# Patient Record
Sex: Female | Born: 1935 | Race: White | Hispanic: No | Marital: Married | Smoking: Never smoker
Health system: Southern US, Community
[De-identification: ages and names within clinical notes are randomized; demographics above are authoritative.]

## PROBLEM LIST (undated history)

## (undated) DIAGNOSIS — I209 Angina pectoris, unspecified: Secondary | ICD-10-CM

## (undated) DIAGNOSIS — I10 Essential (primary) hypertension: Secondary | ICD-10-CM

## (undated) DIAGNOSIS — K5730 Diverticulosis of large intestine without perforation or abscess without bleeding: Secondary | ICD-10-CM

## (undated) DIAGNOSIS — H269 Unspecified cataract: Secondary | ICD-10-CM

## (undated) DIAGNOSIS — K2970 Gastritis, unspecified, without bleeding: Secondary | ICD-10-CM

## (undated) DIAGNOSIS — E785 Hyperlipidemia, unspecified: Secondary | ICD-10-CM

## (undated) DIAGNOSIS — D649 Anemia, unspecified: Secondary | ICD-10-CM

## (undated) DIAGNOSIS — F32A Depression, unspecified: Secondary | ICD-10-CM

## (undated) DIAGNOSIS — E079 Disorder of thyroid, unspecified: Secondary | ICD-10-CM

## (undated) DIAGNOSIS — F329 Major depressive disorder, single episode, unspecified: Secondary | ICD-10-CM

## (undated) DIAGNOSIS — I2510 Atherosclerotic heart disease of native coronary artery without angina pectoris: Secondary | ICD-10-CM

## (undated) DIAGNOSIS — K449 Diaphragmatic hernia without obstruction or gangrene: Secondary | ICD-10-CM

## (undated) DIAGNOSIS — K635 Polyp of colon: Secondary | ICD-10-CM

## (undated) DIAGNOSIS — E119 Type 2 diabetes mellitus without complications: Secondary | ICD-10-CM

## (undated) DIAGNOSIS — K209 Esophagitis, unspecified: Secondary | ICD-10-CM

## (undated) DIAGNOSIS — K219 Gastro-esophageal reflux disease without esophagitis: Secondary | ICD-10-CM

## (undated) HISTORY — DX: Anemia, unspecified: D64.9

## (undated) HISTORY — DX: Gastro-esophageal reflux disease without esophagitis: K21.9

## (undated) HISTORY — DX: Diverticulosis of large intestine without perforation or abscess without bleeding: K57.30

## (undated) HISTORY — DX: Unspecified cataract: H26.9

## (undated) HISTORY — DX: Type 2 diabetes mellitus without complications: E11.9

## (undated) HISTORY — DX: Disorder of thyroid, unspecified: E07.9

## (undated) HISTORY — DX: Polyp of colon: K63.5

## (undated) HISTORY — DX: Hyperlipidemia, unspecified: E78.5

## (undated) HISTORY — DX: Diaphragmatic hernia without obstruction or gangrene: K44.9

## (undated) HISTORY — DX: Essential (primary) hypertension: I10

## (undated) HISTORY — DX: Esophagitis, unspecified: K20.9

## (undated) HISTORY — DX: Gastritis, unspecified, without bleeding: K29.70

---

## 1982-01-07 HISTORY — PX: DILATION AND CURETTAGE OF UTERUS: SHX78

## 1997-01-07 HISTORY — PX: BREAST BIOPSY: SHX20

## 1997-04-22 ENCOUNTER — Other Ambulatory Visit: Admission: RE | Admit: 1997-04-22 | Discharge: 1997-04-22 | Payer: Self-pay | Admitting: Dermatology

## 1998-11-16 ENCOUNTER — Ambulatory Visit (HOSPITAL_COMMUNITY): Admission: RE | Admit: 1998-11-16 | Discharge: 1998-11-16 | Payer: Self-pay | Admitting: Internal Medicine

## 1998-11-16 ENCOUNTER — Encounter: Payer: Self-pay | Admitting: Internal Medicine

## 2000-11-25 ENCOUNTER — Other Ambulatory Visit: Admission: RE | Admit: 2000-11-25 | Discharge: 2000-11-25 | Payer: Self-pay | Admitting: Internal Medicine

## 2005-01-22 ENCOUNTER — Other Ambulatory Visit: Admission: RE | Admit: 2005-01-22 | Discharge: 2005-01-22 | Payer: Self-pay | Admitting: Internal Medicine

## 2005-04-29 ENCOUNTER — Ambulatory Visit (HOSPITAL_COMMUNITY): Admission: RE | Admit: 2005-04-29 | Discharge: 2005-04-29 | Payer: Self-pay | Admitting: Internal Medicine

## 2006-11-24 ENCOUNTER — Encounter: Payer: Self-pay | Admitting: Gastroenterology

## 2006-12-03 ENCOUNTER — Encounter: Payer: Self-pay | Admitting: Gastroenterology

## 2007-01-08 DIAGNOSIS — K219 Gastro-esophageal reflux disease without esophagitis: Secondary | ICD-10-CM

## 2007-01-08 DIAGNOSIS — K209 Esophagitis, unspecified without bleeding: Secondary | ICD-10-CM

## 2007-01-08 DIAGNOSIS — K5730 Diverticulosis of large intestine without perforation or abscess without bleeding: Secondary | ICD-10-CM

## 2007-01-08 DIAGNOSIS — K635 Polyp of colon: Secondary | ICD-10-CM

## 2007-01-08 DIAGNOSIS — K2970 Gastritis, unspecified, without bleeding: Secondary | ICD-10-CM

## 2007-01-08 DIAGNOSIS — K449 Diaphragmatic hernia without obstruction or gangrene: Secondary | ICD-10-CM

## 2007-01-08 HISTORY — DX: Gastro-esophageal reflux disease without esophagitis: K21.9

## 2007-01-08 HISTORY — DX: Polyp of colon: K63.5

## 2007-01-08 HISTORY — DX: Diverticulosis of large intestine without perforation or abscess without bleeding: K57.30

## 2007-01-08 HISTORY — DX: Esophagitis, unspecified without bleeding: K20.90

## 2007-01-08 HISTORY — DX: Diaphragmatic hernia without obstruction or gangrene: K44.9

## 2007-01-08 HISTORY — DX: Gastritis, unspecified, without bleeding: K29.70

## 2007-02-25 ENCOUNTER — Encounter: Payer: Self-pay | Admitting: Gastroenterology

## 2007-05-26 ENCOUNTER — Encounter: Payer: Self-pay | Admitting: Gastroenterology

## 2007-06-02 ENCOUNTER — Encounter: Payer: Self-pay | Admitting: Gastroenterology

## 2007-06-04 ENCOUNTER — Ambulatory Visit: Payer: Self-pay | Admitting: Gastroenterology

## 2007-06-04 DIAGNOSIS — N182 Chronic kidney disease, stage 2 (mild): Secondary | ICD-10-CM

## 2007-06-04 DIAGNOSIS — E1122 Type 2 diabetes mellitus with diabetic chronic kidney disease: Secondary | ICD-10-CM | POA: Insufficient documentation

## 2007-06-04 DIAGNOSIS — Z6824 Body mass index (BMI) 24.0-24.9, adult: Secondary | ICD-10-CM | POA: Insufficient documentation

## 2007-06-04 DIAGNOSIS — E782 Mixed hyperlipidemia: Secondary | ICD-10-CM

## 2007-06-04 DIAGNOSIS — E1169 Type 2 diabetes mellitus with other specified complication: Secondary | ICD-10-CM | POA: Insufficient documentation

## 2007-06-04 DIAGNOSIS — I10 Essential (primary) hypertension: Secondary | ICD-10-CM | POA: Insufficient documentation

## 2007-06-08 ENCOUNTER — Ambulatory Visit: Payer: Self-pay | Admitting: Gastroenterology

## 2007-06-08 ENCOUNTER — Encounter: Payer: Self-pay | Admitting: Gastroenterology

## 2007-06-09 ENCOUNTER — Encounter: Payer: Self-pay | Admitting: Gastroenterology

## 2007-06-11 ENCOUNTER — Encounter: Payer: Self-pay | Admitting: Gastroenterology

## 2007-06-14 ENCOUNTER — Encounter: Payer: Self-pay | Admitting: Gastroenterology

## 2010-02-06 NOTE — Miscellaneous (Signed)
Summary: Kapidex  Clinical Lists Changes  Medications: Added new medication of KAPIDEX 30 MG  CPDR (DEXLANSOPRAZOLE) Take one capsule by mouth 30 minutes before breakfast - Signed Rx of KAPIDEX 30 MG  CPDR (DEXLANSOPRAZOLE) Take one capsule by mouth 30 minutes before breakfast;  #31 x 6;  Signed;  Entered by: Eual Fines RN;  Authorized by: Mardella Layman MD FACG,FAGA;  Method used: Electronic    Prescriptions: KAPIDEX 30 MG  CPDR (DEXLANSOPRAZOLE) Take one capsule by mouth 30 minutes before breakfast  #31 x 6   Entered by:   Eual Fines RN   Authorized by:   Mardella Layman MD [facility]   Signed by:   Eual Fines RN on 06/08/2007   Method used:   Electronically sent to ...       [facility]  [address] [id number]       [address]       [place], [place]  [address]       Ph: [phone] or [phone]       Fax: [phone]   RxID:   [id number]

## 2010-02-06 NOTE — Procedures (Signed)
Summary: Colonoscopy   Colonoscopy  Procedure date:  06/08/2007  Findings:      Location:  [facility].    Procedures Next Due Date:    Colonoscopy: 06/2012  Patient Name: [name] [name], [name] [name] MRN:  Procedure Procedures: Colonoscopy CPT: 45409.  Personnel: Endoscopist: Vania Rea. Jarold Motto, MD.  Exam Location: Exam performed in Outpatient Clinic. Outpatient  Patient Consent: Procedure, Alternatives, Risks and Benefits discussed, consent obtained, from patient. Consent was obtained by the RN.  Indications Symptoms: Constipation Patient's stools are infrequent. Abdominal pain / bloating.  Average Risk Screening Routine.  History  Current Medications: Patient is not currently taking Coumadin.  Medical/ Surgical History: Adult Onset Diabetes, Hypothyroidism, Hyperlipidemia, Hypertension,  Comments: Known umbilical hernia and diverticulosis seen on CT scan.. Pre-Exam Physical: Performed Jun 08, 2007. Cardio-pulmonary exam, Rectal exam, Abdominal exam, Extremity exam, Mental status exam WNL.  Comments: Pt. history reviewed/updated, physical exam performed prior to initiation of sedation? yes Exam Exam: Extent of exam reached: Cecum, extent intended: Cecum.  The cecum was identified by appendiceal orifice and IC valve. Patient position: on left side. Time to Cecum: 00:03:40. Time for Withdrawl: 00:06:20. Colon retroflexion performed. Images taken. ASA Classification: II. Tolerance: excellent.  Monitoring: Pulse and BP monitoring, Oximetry used. Supplemental O2 given. at 2 Liters.  Colon Prep Used Golytely for colon prep.  Sedation Meds: Patient assessed and found to be appropriate for moderate (conscious) sedation. Fentanyl 50 mcg. given IV. Versed 7 mg. given IV.  Instrument(s): CF 140L. Serial [id number].  Findings - NORMAL EXAM: Cecum to Descending Colon. Not Seen: Polyps. AVM's. Colitis. Tumors. Crohn's.  - DIVERTICULOSIS: Descending Colon to  Sigmoid Colon. Not bleeding. ICD9: Diverticulosis, Colon: 562.10.  - POLYP: Sigmoid Colon, Maximum size: 4 mm. sessile polyp. Procedure:  biopsy without cautery, removed, retrieved, Polyp sent to pathology. ICD9: Colon Polyps: 211.3.  - NORMAL EXAM: Rectum. Not Seen: AVM's. Tumors. Crohn's. Hemorrhoids.   Assessment  Diagnoses: 562.10: Diverticulosis, Colon.  211.3: Colon Polyps.   Comments: NO CAUSE NOTED FOR RUQ PAIN...PROBABLE MS PAIN VS FATTY LIVER PROBLEMS.... Events  Unplanned Interventions: No intervention was required.  Plans Medication Plan: Continue current medications.  Patient Education: Patient given standard instructions for: Polyps. Diverticulosis. Patient instructed to get routine colonoscopy every 5 years.  Disposition: After procedure patient sent to recovery. After recovery patient sent home.  Scheduling/Referral: Follow-Up prn. Await pathology to schedule patient.   cc:  Lucky Cowboy MD     REPORT OF SURGICAL PATHOLOGY   Case #: [id number] Patient Name: [name] [name], [name] [name]. Office Chart Number:  N/A   MRN: [id number] Pathologist: Ferd Hibbs. Colonel Bald, MD DOB/Age  [birth year] (Age: 77)    Gender: F Date Taken:  06/08/2007 Date Received: 06/09/2007   FINAL DIAGNOSIS   ***MICROSCOPIC EXAMINATION AND DIAGNOSIS***   RECTUM, POLYP(S) -  TUBULAR ADENOMA. -  HIGH GRADE DYSPLASIA IS NOT IDENTIFIED. -  SEE COMMENT.   COMMENT Dr. Tammi Sou has reviewed the case and concurs with this interpretation.  (JBK:kv 06-10-07)   kv Date Reported:  06/10/2007     Ferd Hibbs. Colonel Bald, MD *** Electronically Signed Out By [facility] ***     June 14, 2007 MRN: [id number]    [name] [name] [address] [place], [place]  [address]    [name],  I am pleased to inform you that the colon polyp(s) removed during your recent colonoscopy was (were) found to be benign (no cancer detected) upon pathologic examination.  I recommend you have a repeat  colonoscopy  examination in 5_ years to look for recurrent polyps, as having colon polyps increases your risk for having recurrent polyps or even colon cancer in the future.  Should you develop new or worsening symptoms of abdominal pain, bowel habit changes or bleeding from the rectum or bowels, please schedule an evaluation with either your primary care physician or with me.  Additional information/recommendations:  __ No further action with gastroenterology is needed at this time. Please      follow-up with your primary care physician for your other healthcare      needs.  __ Please call [phone] to schedule a return visit to review your      situation.  __ Please keep your follow-up visit as already scheduled.  _x_ Continue treatment plan as outlined the day of your exam.  Please call us if you are having persistent problems or have questions about your condition that have not been fully answered at this time.  Sincerely,  Mardella Layman MD [facility]  This letter has been electronically signed by your physician.   This report was created from the original endoscopy report, which was reviewed and signed by the above listed endoscopist.

## 2010-02-06 NOTE — Assessment & Plan Note (Signed)
Summary: RUQ PAIN.Marland KitchenEM   History of Present Illness Visit Type: consult Primary GI MD: Sheryn Bison MD FACP FAGA Primary Provider: Dr. Lucky Cowboy Requesting Provider: Dr. Lucky Cowboy Chief Complaint: RUQ intermittant sharp abd pains X 1 yr, now getting worse and becoming more frequent History of Present Illness:   This middle-aged obese white female with adult onset diabetes is referred through the courtesy ofDr. Oneta Rack for evaluation of one-year of right upper quadrant pain which is muscular skeletal in nature. She's had extensive hepatobiliary workup included ultrasound, HIDA scan, and recent CT scan all of which have been normal except for a 9 mm common bile duct and possible debris in the common bile duct although this is unclear per radiology report. She certainly has no hepatobiliary symptomatology such as icterus, clay colored stools, or dark urine. She has no history of fever, chills, anorexia, weight loss, or chronic dyspepsia or acid reflux. She does have early satiety his family she may have some abdominal discomfort if she keeps too much at one time. As mentioned above she does have adult onset diabetes without complications such as peripheral neuropathy. However, she does have rather marked chronic constipation without melena or hematochezia her appetite is good her weight is been stable.  She describes a right upper quadrant pain as occurring when she moves and turns in bed restrained herself. She had no history of hepatitis or pancreatitis. She never had colonoscopy endoscopic exams. Recent multiple lab tests including liver function tests had been repeatedly normal. She denies abuse of alcohol, cigarettes, or NSAIDs. She's never had colonoscopy or endoscopy.   GI Review of Systems      Denies abdominal pain, acid reflux, belching, bloating, chest pain, dysphagia with liquids, dysphagia with solids, heartburn, loss of appetite, nausea, vomiting, vomiting blood, weight  loss, and  weight gain.      Reports constipation.     Denies anal fissure, black tarry stools, change in bowel habit, diarrhea, diverticulosis, fecal incontinence, heme positive stool, hemorrhoids, irritable bowel syndrome, jaundice, light color stool, liver problems, rectal bleeding, and  rectal pain.     Updated Prior Medication List: LEVOTHROID 75 MCG  TABS (LEVOTHYROXINE SODIUM) 1/2 tablet on Tues,Thurs,Sat,Sun and 1 tablet on Mon, Wed, Friday VERAPAMIL HCL CR 240 MG  TBCR (VERAPAMIL HCL) 1 tablet three times a day on Monday and Friday ATENOLOL 100 MG  TABS (ATENOLOL) once daily LISINOPRIL 10 MG  TABS (LISINOPRIL) once daily PRAVASTATIN SODIUM 40 MG  TABS (PRAVASTATIN SODIUM) once daily GLYBURIDE 5 MG  TABS (GLYBURIDE) one tablet every other day JANUVIA 100 MG  TABS (SITAGLIPTIN PHOSPHATE) one tablet every other day ANACIN 81 MG  TBEC (ASPIRIN) once daily  Current Allergies (reviewed today): No known allergies   Past Medical History:    Diabetes    Hypothyroidism    Hyperlipidemia    Hypertension    Uterine fibroids        Diverticulosis seen on CT scan        Small right renal cyst and atherosclerosis senile CT scan        Hilum hernia and umbilical hernia seen on CT scan.        Thoracic and lumbar spondylosis also seen on CT scan.   Family History:    Family History of Breast Cancer:Grandmother,Aunt    Family History of Diabetes: Father, Aunt  Social History:    Married    Occupation: retired    Patient has never smoked.     Alcohol Use -  no    Illicit Drug Use - no   Risk Factors:  Tobacco use:  never Drug use:  no Alcohol use:  no   Review of Systems       The patient complains of abdominal pain.  The patient denies anorexia, fever, weight loss, weight gain, vision loss, decreased hearing, hoarseness, chest pain, syncope, dyspnea on exertion, peripheral edema, prolonged cough, headaches, hemoptysis, melena, hematochezia, severe  indigestion/heartburn, hematuria, incontinence, genital sores, muscle weakness, suspicious skin lesions, transient blindness, difficulty walking, depression, unusual weight change, abnormal bleeding, enlarged lymph nodes, angioedema, breast masses, and testicular masses.         as mentioned above she does have symptoms of early satiety suggestive of diabetic gastroparesis. She denies any dyspepsia or acid reflux symptoms.  Eyes      Denies blurring, diplopia, irritation, discharge, vision loss, scotoma, eye pain, and photophobia.  CV      Denies chest pains, angina, palpitations, syncope, dyspnea on exertion, orthopnea, PND, peripheral edema, and claudication.  Resp      Denies dyspnea at rest, dyspnea with exercise, cough, sputum, wheezing, coughing up blood, and pleurisy.  GI      Denies difficulty swallowing, pain on swallowing, nausea, indigestion/heartburn, vomiting, vomiting blood, abdominal pain, jaundice, gas/bloating, diarrhea, constipation, change in bowel habits, bloody BM's, black BMs, and fecal incontinence.  MS      Complains of joint stiffness and low back pain.   Vital Signs:  Patient Profile:   77 Years Old Female Height:     62 inches Weight:      172 pounds BMI:     31.57 Pulse rate:   80 / minute Pulse rhythm:   regular BP sitting:   126 / 72  (left arm)  Vitals Entered By: Christie Nottingham CMA (Jun 04, 2007 9:07 AM)                  Physical Exam  General:     Well developed, well nourished, no acute distress. Head:     The patient seemed to have excessive amounts of facial hair suggesting possible underlying normal dysfunction. Neck:     Supple; no masses or thyromegaly. Chest Wall:     Symmetrical;  no deformities or tenderness. Lungs:     Clear throughout to auscultation. Heart:     Regular rate and rhythm; no murmurs, rubs,  or bruits. Abdomen:     Her abdominal exam showed no definite organomegaly, masses or tenderness. She did have a  floating rib in the right upper quadrant but it was nontender. Bowel sounds were normal. Rectal exam is deferred. There is no evidence of abdominal masses or ascites. Rectal exam is deferred. Msk:     Symmetrical with no gross deformities. Normal posture. Extremities:     No clubbing, cyanosis, edema or deformities noted. Neurologic:     Alert and  oriented x4;  grossly normal neurologically.    Impression & Recommendations:  Problem # 1:  ABDOMINAL PAIN-RUQ (ICD-789.01) Assessment: Unchanged She has had right upper quadrant pain for over year without any hepatobiliary complaints or abnormal labs. I think her pain is musculoskeletal in nature related to her obesity. Orders: Colon/Endo (Colon/Endo)   Problem # 2:  EARLY SATIETY (ICD-780.94) Assessment: Unchanged this symptom is most consistent with mild diabetic gastroparesis and we will do endoscopy and consider gastric emptying scan and possible prokinetic therapy although I do not feel this is a big problem for her.  Problem # 3:  CONSTIPATION (ICD-564.00) Assessment: Unchanged Again I thank her constipation related to her diabetes and her diverticulosis and should respond to increase fiber in her diet more liberal p.o. fluids. However, she never had colonoscopy exam which is definitely needed and has been scheduled for her. Orders: Colon/Endo (Colon/Endo)   Problem # 4:  ABNORMAL EXAM-BILIARY TRACT (ICD-793.3) Assessment: New I do not think that this patient has common bile duct stones I do not think it is worth the risk of ERCP or endoscopic ultrasound at this point. She probably has some fatty infiltration of her liver associated with her obesity and metabolic syndrome and would benefit from weight loss instructions and a scheduled program under dietary control. Orders: Colon/Endo (Colon/Endo)   Problem # 5:  DIABETES MELLITUS-TYPE II (ICD-250.00) Assessment: Unchanged Continued oral medications as per Dr.  Oneta Rack. Orders: Colon/Endo (Colon/Endo)    Patient Instructions: 1)  copy to Dr. Marlowe Shores. 2)  Colonoscopy and Flexible Sigmoidoscopy brochure given. 3)  Conscious Sedation brochure given. 4)  Upper Endoscopy brochure given.    Prescriptions: MOVIPREP 100 GM  SOLR (PEG-KCL-NACL-NASULF-NA ASC-C) As per prep instructions.  #1 x 0   Entered by:   Harlow Mares CMA   Authorized by:   Mardella Layman MD FACG,FAGA   Signed by:   Harlow Mares CMA on 06/04/2007   Method used:   Electronically [facility] to ...       [facility]  [address] [id number]       [address]       [place], [place]  [address]       Ph: [phone] or [phone]       Fax: [phone]   RxID:   [id number]  ]

## 2010-02-06 NOTE — Medication Information (Signed)
Summary: approval/Kapidex/[employer]  approval/Kapidex/[employer]   Imported By: Lester La Villa 06/12/2007 11:23:24  _____________________________________________________________________  External Attachment:    Type:   Image     Comment:   External Document

## 2010-02-06 NOTE — Letter (Signed)
Summary: Patient Notice- Polyp Results  [facility]  [address] [place], [place] [address]   Phone: [phone]  Fax: [phone]        June 14, 2007 MRN: [id number]    [name] [name] [address] [place], [place]  [address]    Dear [name],  I am pleased to inform you that the colon polyp(s) removed during your recent colonoscopy was (were) found to be benign (no cancer detected) upon pathologic examination.  I recommend you have a repeat colonoscopy examination in 5_ years to look for recurrent polyps, as having colon polyps increases your risk for having recurrent polyps or even colon cancer in the future.  Should you develop new or worsening symptoms of abdominal pain, bowel habit changes or bleeding from the rectum or bowels, please schedule an evaluation with either your primary care physician or with me.  Additional information/recommendations:  __ No further action with gastroenterology is needed at this time. Please      follow-up with your primary care physician for your other healthcare      needs.  __ Please call [phone] to schedule a return visit to review your      situation.  __ Please keep your follow-up visit as already scheduled.  _x_ Continue treatment plan as outlined the day of your exam.  Please call us if you are having persistent problems or have questions about your condition that have not been fully answered at this time.  Sincerely,  Mardella Layman MD [facility]  This letter has been electronically signed by your physician.

## 2010-02-06 NOTE — Procedures (Signed)
Summary: EGD   EGD  Procedure date:  06/08/2007  Findings:      Location: [facility]    Patient Name: [name] [name], Convery MRN:  Procedure Procedures: Panendoscopy (EGD) CPT: 43235.  Personnel: Endoscopist: Vania Rea. Jarold Motto, MD.  Exam Location: Exam performed in Outpatient Clinic. Outpatient  Patient Consent: Procedure, Alternatives, Risks and Benefits discussed, consent obtained, from patient. Consent was obtained by the RN.  Indications Symptoms: Early satiety. Abdominal pain, location: RUQ. Reflux symptoms  History  Current Medications: Patient is not currently taking Coumadin.  Medical/Surgical History: Adult Onset Diabetes, Hypothyroidism, Hyperlipidemia, Hypertension,  Pre-Exam Physical: Performed Jun 08, 2007  Cardio-pulmonary exam, Abdominal exam, Extremity exam, Mental status exam WNL.  Comments: Pt. history reviewed/updated, physical exam performed prior to initiation of sedation?YES Exam Exam Info: Maximum depth of insertion Duodenum, intended Duodenum. Patient position: on left side. Duration of exam: 10 minutes. Vocal cords visualized. Gastric retroflexion performed. Images taken. ASA Classification: II. Tolerance: excellent.  Sedation Meds: Patient assessed and found to be appropriate for moderate (conscious) sedation. Sedation was managed by the Endoscopist.  Monitoring: BP and pulse monitoring done. Oximetry used. Supplemental O2 given at 2 Liters.  Instrument(s): GIF 160. Serial [id number].   Findings - Normal: Proximal Esophagus.  - HIATAL HERNIA: Prolapsing, 5 cms. in length. ICD9: Hernia, Hiatal: 553.3.Comments: FREE REFLUX NOTED.......  - ESOPHAGEAL INFLAMMATION: as a result of reflux. Length of inflammation: 4 cm. [place] Classification: Grade A. ICD9: Esophagitis, Reflux: 530.11.  - Normal: Duodenal Bulb to Duodenal 2nd Portion. Not Seen: Ulcer. Mucosal abnormality.  - MUCOSAL ABNORMALITY: Body to Antrum. Nodularity  present. Red spots present. RUT done, results pending. ICD9: Gastritis, Unspecified: 535.50.   Assessment  Diagnoses: 530.11: Esophagitis, Reflux. GERD.  553.3: Hernia, Hiatal.  535.50: Gastritis, Unspecified.   Events  Unplanned Intervention: No unplanned interventions were required.  Plans Medication(s): Continue current medications. Other: Kapidex 60 mg  QAM, starting Jun 08, 2007 for 4 wks.   Patient Education: Patient given standard instructions for: Reflux.  Disposition: After procedure patient sent to recovery. After recovery patient sent home.  Scheduling: Await pathology to schedule patient. Clinic Visit, to Vania Rea. Jarold Motto, MD, around Jul 08, 2007.    cc:  Lucky Cowboy MD  This report was created from the original endoscopy report, which was reviewed and signed by the above listed endoscopist.

## 2010-07-25 ENCOUNTER — Other Ambulatory Visit: Payer: Self-pay | Admitting: Internal Medicine

## 2010-07-25 ENCOUNTER — Other Ambulatory Visit (HOSPITAL_COMMUNITY)
Admission: RE | Admit: 2010-07-25 | Discharge: 2010-07-25 | Disposition: A | Discharge status: Home / Self Care | Payer: PRIVATE HEALTH INSURANCE | Source: Ambulatory Visit | Attending: Internal Medicine | Admitting: Internal Medicine

## 2010-07-25 DIAGNOSIS — Z124 Encounter for screening for malignant neoplasm of cervix: Secondary | ICD-10-CM | POA: Insufficient documentation

## 2010-07-25 DIAGNOSIS — Z1159 Encounter for screening for other viral diseases: Secondary | ICD-10-CM | POA: Insufficient documentation

## 2010-07-26 ENCOUNTER — Encounter: Payer: Self-pay | Admitting: Gastroenterology

## 2010-08-28 ENCOUNTER — Ambulatory Visit: Payer: Self-pay | Admitting: Gastroenterology

## 2012-04-01 ENCOUNTER — Encounter: Payer: Self-pay | Admitting: Gastroenterology

## 2012-04-14 ENCOUNTER — Encounter: Payer: Self-pay | Admitting: *Deleted

## 2012-04-21 ENCOUNTER — Ambulatory Visit (INDEPENDENT_AMBULATORY_CARE_PROVIDER_SITE_OTHER): Payer: PRIVATE HEALTH INSURANCE | Admitting: Gastroenterology

## 2012-04-21 ENCOUNTER — Ambulatory Visit: Payer: Self-pay | Admitting: Gastroenterology

## 2012-04-21 ENCOUNTER — Encounter: Payer: Self-pay | Admitting: Gastroenterology

## 2012-04-21 VITALS — BP 132/76 | HR 86 | Ht 60.5 in | Wt 155.0 lb

## 2012-04-21 DIAGNOSIS — R112 Nausea with vomiting, unspecified: Secondary | ICD-10-CM

## 2012-04-21 DIAGNOSIS — K3184 Gastroparesis: Secondary | ICD-10-CM

## 2012-04-21 DIAGNOSIS — Z86010 Personal history of colonic polyps: Secondary | ICD-10-CM

## 2012-04-21 DIAGNOSIS — E1149 Type 2 diabetes mellitus with other diabetic neurological complication: Secondary | ICD-10-CM

## 2012-04-21 DIAGNOSIS — K5900 Constipation, unspecified: Secondary | ICD-10-CM

## 2012-04-21 DIAGNOSIS — E119 Type 2 diabetes mellitus without complications: Secondary | ICD-10-CM

## 2012-04-21 DIAGNOSIS — E1143 Type 2 diabetes mellitus with diabetic autonomic (poly)neuropathy: Secondary | ICD-10-CM

## 2012-04-21 NOTE — Progress Notes (Signed)
This is a 79 year old Caucasian female a previously saw in January of 2012 her musculoskeletal right upper quadrant pain.  At that time she had CT scan of the abdomen which was unremarkable except for a 9 mm common bile duct.  Also felt that she had mild diabetic gastroparesis.  She is now referred for evaluation of intermittent nausea and vomiting and vague right upper quadrant discomfort with associated constipation but no melena or hematochezia.  She denies reflux symptoms, dysphagia, anorexia, weight loss, fever chills.  Recent laboratory data showed normal CBC, liver profile, and hemoglobin A1c of 8.1.  She is referred for possible endoscopic exam.  It is unclear to me when she last had colonoscopy.  Family history is remarkable for a brother who possibly has achalasia.  Review of her medication shows that she is not on NSAIDs, but is recently been placed on Reglan 5 mg 3 times a day with marked improvement.  She also takes and takes 300 mg at bedtime, Celexa 40 mg a day, Lipitor 80 mg a day, verapamil 240 mg a day along with metformin 1 g twice a day.  She is accompanied today by her niece who gives most of the history.  The patient appears to have some mild dementia.  Current Medications, Allergies, Past Medical History, Past Surgical History, Family History and Social History were reviewed in [employer] record.  ROS: All systems were reviewed and are negative unless otherwise stated in the HPI.          Physical Exam: Healthy-appearing nontoxic female in no distress.  Blood pressure 132/76, pulse 86 and regular and weight 155 with a BMI of 29.76.  I cannot appreciate stigmata of chronic liver disease.  Her chest is entirely clear she appear to be in a regular rhythm without murmurs gallops or rubs.  There is no hepatosplenomegaly, abdominal masses or significant tenderness.  Bowel sounds are normal and I cannot appreciate a succussion splash.  Peripheral extremities are  unremarkable.  Rectal exam shows impacted stool in the rectal vault which is guaiac negative.  Mental status is depressed but patient oriented x3.    Assessment and Plan: Probable diabetic gastroparesis as previously suspected.  She has associated mild chronic functional constipation.  I will begin her examination with endoscopy tomorrow afternoon, and if this is unremarkable we will proceed with strict emptying scan, high-resolution esophageal manometry, and does some point she will need colonoscopy exam per a tubular adenoma removed in 2009.  I've asked her continue Reglan since this is given her good symptomatic improvement.  Dr. Oneta Rack is working with the patient for better diabetic control, we will continue other medications as listed and reviewed.  Her symptoms do not seem consistent with hepatobiliary disease or achalasia.  After endoscopy we'll start her on regular MiraLax therapy and probably had PPI medications to her regime. Encounter Diagnosis  Name Primary?  . Nausea with vomiting Yes

## 2012-04-21 NOTE — Patient Instructions (Addendum)
You have been scheduled for an endoscopy with propofol. Please follow written instructions given to you at your visit today. If you use inhalers (even only as needed), please bring them with you on the day of your procedure. CC:  Lucky Cowboy MD

## 2012-04-22 ENCOUNTER — Encounter: Payer: Self-pay | Admitting: Gastroenterology

## 2012-04-22 ENCOUNTER — Ambulatory Visit (AMBULATORY_SURGERY_CENTER): Payer: PRIVATE HEALTH INSURANCE | Admitting: Gastroenterology

## 2012-04-22 ENCOUNTER — Other Ambulatory Visit: Payer: Self-pay | Admitting: Gastroenterology

## 2012-04-22 VITALS — BP 151/88 | HR 78 | Temp 97.8°F | Resp 20 | Ht 60.5 in | Wt 155.0 lb

## 2012-04-22 DIAGNOSIS — K219 Gastro-esophageal reflux disease without esophagitis: Secondary | ICD-10-CM

## 2012-04-22 DIAGNOSIS — K209 Esophagitis, unspecified without bleeding: Secondary | ICD-10-CM

## 2012-04-22 DIAGNOSIS — R6881 Early satiety: Secondary | ICD-10-CM

## 2012-04-22 DIAGNOSIS — R112 Nausea with vomiting, unspecified: Secondary | ICD-10-CM

## 2012-04-22 DIAGNOSIS — R1011 Right upper quadrant pain: Secondary | ICD-10-CM

## 2012-04-22 MED ORDER — SODIUM CHLORIDE 0.9 % IV SOLN: 500.0000 mL | INTRAVENOUS | Status: DC

## 2012-04-22 MED ORDER — DEXLANSOPRAZOLE 60 MG PO CPDR: 60.0000 mg | DELAYED_RELEASE_CAPSULE | Freq: Every day | ORAL | Status: DC

## 2012-04-22 NOTE — Patient Instructions (Addendum)
Biopsies taken today. Try to follow Gastroparesis diet, see handouts. Take new medication Dexilant daily,samples given. Blood sugar was 148 today. Dr.Patterson's nurse will call about Gastric emptying scan. Call us with any questions or concerns. Thank you!  YOU HAD AN ENDOSCOPIC PROCEDURE TODAY AT THE [facility]: Refer to the procedure report that was given to you for any specific questions about what was found during the examination.  If the procedure report does not answer your questions, please call your gastroenterologist to clarify.  If you requested that your care partner not be given the details of your procedure findings, then the procedure report has been included in a sealed envelope for you to review at your convenience later.  YOU SHOULD EXPECT: Some feelings of bloating in the abdomen. Passage of more gas than usual.  Walking can help get rid of the air that was put into your GI tract during the procedure and reduce the bloating. If you had a lower endoscopy (such as a colonoscopy or flexible sigmoidoscopy) you may notice spotting of blood in your stool or on the toilet paper. If you underwent a bowel prep for your procedure, then you may not have a normal bowel movement for a few days.  DIET: Your first meal following the procedure should be a light meal and then it is ok to progress to your normal diet.  A half-sandwich or bowl of soup is an example of a good first meal.  Heavy or fried foods are harder to digest and may make you feel nauseous or bloated.  Likewise meals heavy in dairy and vegetables can cause extra gas to form and this can also increase the bloating.  Drink plenty of fluids but you should avoid alcoholic beverages for 24 hours.  ACTIVITY: Your care partner should take you home directly after the procedure.  You should plan to take it easy, moving slowly for the rest of the day.  You can resume normal activity the day after the procedure however you should NOT  DRIVE or use heavy machinery for 24 hours (because of the sedation medicines used during the test).    SYMPTOMS TO REPORT IMMEDIATELY: A gastroenterologist can be reached at any hour.  During normal business hours, 8:30 AM to 5:00 PM Monday through Friday, call [phone].  After hours and on weekends, please call the GI answering service at [phone] who will take a message and have the physician on call contact you.   Following lower endoscopy (colonoscopy or flexible sigmoidoscopy):  Excessive amounts of blood in the stool  Significant tenderness or worsening of abdominal pains  Swelling of the abdomen that is new, acute  Fever of 100F or higher  Following upper endoscopy (EGD)  Vomiting of blood or coffee ground material  New chest pain or pain under the shoulder blades  Painful or persistently difficult swallowing  New shortness of breath  Fever of 100F or higher  Black, tarry-looking stools  FOLLOW UP: If any biopsies were taken you will be contacted by phone or by letter within the next 1-3 weeks.  Call your gastroenterologist if you have not heard about the biopsies in 3 weeks.  Our staff will call the home number listed on your records the next business day following your procedure to check on you and address any questions or concerns that you may have at that time regarding the information given to you following your procedure. This is a courtesy call and so if there is  no answer at the home number and we have not heard from you through the emergency physician on call, we will assume that you have returned to your regular daily activities without incident.  SIGNATURES/CONFIDENTIALITY: You and/or your care partner have signed paperwork which will be entered into your electronic medical record.  These signatures attest to the fact that that the information above on your After Visit Summary has been reviewed and is understood.  Full responsibility of the confidentiality of  this discharge information lies with you and/or your care-partner.

## 2012-04-22 NOTE — Op Note (Signed)
[facility] [address] [place], [address]   ENDOSCOPY PROCEDURE REPORT  PATIENT: [name], [name]  MR#: [id number] BIRTHDATE: [birth year] , 80  yrs. old GENDER: Female ENDOSCOPIST:Matie Dimaano Hale Bogus, MD, [facility] REFERRED BY: Lucky Cowboy, M.D. PROCEDURE DATE:  04/22/2012 PROCEDURE:   EGD w/ biopsy and EGD w/ biopsy for H.pylori ASA CLASS:    Class III INDICATIONS: Vomiting and Nausea...diabetic. MEDICATION: propofol (Diprivan) 200mg  IV TOPICAL ANESTHETIC:  DESCRIPTION OF PROCEDURE:   After the risks and benefits of the procedure were explained, informed consent was obtained.  The LB GIF-H180 [id number]  endoscope was introduced through the mouth  and advanced to the second portion of the duodenum .  The instrument was slowly withdrawn as the mucosa was fully examined.      DUODENUM: The duodenal mucosa showed no abnormalities in the bulb and second portion of the duodenum.  Cold forcep biopsies were taken in the second portion.  STOMACH: The mucosa of the stomach appeared normal.  A CLO biopsy was performed.  ESOPHAGUS: Reflux esophagitis was found in the lower third of the esophagus.  Esophagitis was Savary-Miller Grade II: multiple lesions and folds, non-circum. Biopsies done.  Retroflexed views revealed no abnormalities.    The scope was then withdrawn from the patient and the procedure completed.  COMPLICATIONS: There were no complications.   ENDOSCOPIC IMPRESSION: 1.   The duodenal mucosa showed no abnormalities in the bulb and second portion of the duodenum..r/o celiac disease 2.   The mucosa of the stomach appeared normal; biopsy .Marland KitchenMarland KitchenCLO BX. done,r/o H.pylori 3.   Esophagitis consistent with reflux esophagitis in the lower third of the esophagus .Marland KitchenGERD 4.   Suspected gastroparesis RECOMMENDATIONS: Await biopsy results Gastric emptying scan Dexilant 40 mg/day Gastroparesis diet    _______________________________ eSigned:  Mardella Layman, MD, [facility] 04/22/2012 1:48 PM      PATIENT NAME:  [name] [name], [name] [name] MR#: [id number]

## 2012-04-22 NOTE — Progress Notes (Signed)
Called to room to assist during endoscopic procedure.  Patient ID and intended procedure confirmed with present staff. Received instructions for my participation in the procedure from the performing physician.  

## 2012-04-22 NOTE — Progress Notes (Signed)
Patient did not experience any of the following events: a burn prior to discharge; a fall within the facility; wrong site/side/patient/procedure/implant event; or a hospital transfer or hospital admission upon discharge from the facility. (G8907) Patient did not have preoperative order for IV antibiotic SSI prophylaxis. (G8918)  

## 2012-04-23 ENCOUNTER — Telehealth: Payer: Self-pay

## 2012-04-23 ENCOUNTER — Telehealth: Payer: Self-pay | Admitting: *Deleted

## 2012-04-23 ENCOUNTER — Ambulatory Visit (AMBULATORY_SURGERY_CENTER): Payer: PRIVATE HEALTH INSURANCE | Admitting: *Deleted

## 2012-04-23 VITALS — Ht 65.0 in | Wt 155.0 lb

## 2012-04-23 DIAGNOSIS — E119 Type 2 diabetes mellitus without complications: Secondary | ICD-10-CM

## 2012-04-23 DIAGNOSIS — Z1211 Encounter for screening for malignant neoplasm of colon: Secondary | ICD-10-CM

## 2012-04-23 DIAGNOSIS — R112 Nausea with vomiting, unspecified: Secondary | ICD-10-CM

## 2012-04-23 LAB — HELICOBACTER PYLORI SCREEN-BIOPSY: UREASE: NEGATIVE

## 2012-04-23 MED ORDER — MOVIPREP 100 G PO SOLR: ORAL | Status: DC

## 2012-04-23 NOTE — Telephone Encounter (Signed)
Spoke with pt to inform her she needs a PV prior to her COLON on 04/29/12. She was hesitant and didn't fully understand, but will come in this pm. We also spoke of the need for a Gastric Emptying Scan to see if that is the cause of her problems. I will leave her appt info with the PV nurse, Ezra Sites RN and pt may call me for questions.

## 2012-04-23 NOTE — Telephone Encounter (Signed)
Unable to leave message no answering machine. 

## 2012-04-27 ENCOUNTER — Encounter: Payer: Self-pay | Admitting: *Deleted

## 2012-04-27 ENCOUNTER — Encounter: Payer: Self-pay | Admitting: Gastroenterology

## 2012-04-29 ENCOUNTER — Other Ambulatory Visit: Payer: Self-pay | Admitting: *Deleted

## 2012-04-29 ENCOUNTER — Encounter: Payer: Self-pay | Admitting: Gastroenterology

## 2012-04-29 ENCOUNTER — Ambulatory Visit (AMBULATORY_SURGERY_CENTER): Payer: PRIVATE HEALTH INSURANCE | Admitting: Gastroenterology

## 2012-04-29 ENCOUNTER — Other Ambulatory Visit: Payer: Self-pay | Admitting: Gastroenterology

## 2012-04-29 VITALS — BP 137/66 | HR 67 | Temp 97.9°F | Resp 15 | Ht 65.0 in | Wt 155.0 lb

## 2012-04-29 DIAGNOSIS — K3184 Gastroparesis: Secondary | ICD-10-CM

## 2012-04-29 DIAGNOSIS — R1011 Right upper quadrant pain: Secondary | ICD-10-CM

## 2012-04-29 DIAGNOSIS — Z1211 Encounter for screening for malignant neoplasm of colon: Secondary | ICD-10-CM

## 2012-04-29 MED ORDER — SODIUM CHLORIDE 0.9 % IV SOLN: 500.0000 mL | INTRAVENOUS | Status: DC

## 2012-04-29 NOTE — Progress Notes (Signed)
A/ox3 pleased with MAC report to Annette RN 

## 2012-04-29 NOTE — Op Note (Signed)
[facility] [address] [place], [address]   COLONOSCOPY PROCEDURE REPORT  PATIENT: [name], [name]  MR#: [id number] BIRTHDATE: [birth year] , 80  yrs. old GENDER: Female ENDOSCOPIST: Mardella Layman, MD, [facility] Graham REFERRED BY:  Lucky Cowboy, M.D. PROCEDURE DATE:  04/29/2012 PROCEDURE:   Colonoscopy, screening ASA CLASS:   Class III INDICATIONS:Patient's personal history of adenomatous colon polyps.  MEDICATIONS: Propofol (Diprivan) 160 mg IV  DESCRIPTION OF PROCEDURE:   After the risks and benefits and of the procedure were explained, informed consent was obtained.  A digital rectal exam revealed no abnormalities of the rectum.    The LB CF-H180AL [id number]  endoscope was introduced through the anus and advanced to the cecum, which was identified by both the appendix and ileocecal valve .  The quality of the prep was Moviprep fair . The instrument was then slowly withdrawn as the colon was fully examined.     COLON FINDINGS: A normal appearing cecum, ileocecal valve, and appendiceal orifice were identified.  The ascending, hepatic flexure, transverse, splenic flexure, descending, sigmoid colon and rectum appeared unremarkable.  No polyps or cancers were seen. Retroflexed views revealed no abnormalities.     The scope was then withdrawn from the patient and the procedure completed.  COMPLICATIONS: There were no complications. ENDOSCOPIC IMPRESSION: Normal colon ...no polyps or cancer noted...  RECOMMENDATIONS: Continue current medications F/U prn  REPEAT EXAM:  cc:  _______________________________ eSignedMardella Layman, MD, [facility] 04/29/2012 1:58 PM

## 2012-04-29 NOTE — Progress Notes (Signed)
Patient did not experience any of the following events: a burn prior to discharge; a fall within the facility; wrong site/side/patient/procedure/implant event; or a hospital transfer or hospital admission upon discharge from the facility. (G8907) Patient did not have preoperative order for IV antibiotic SSI prophylaxis. (G8918)  

## 2012-04-29 NOTE — Progress Notes (Signed)
I left a message on Graciella Freer, RN phone to call the pt's niece with the scheduling her next test.  The pt's niece is Gwenlyn Found, # [phone].  She is the pt's driver. Maw  No complaints noted in the recovery room. Maw

## 2012-04-29 NOTE — Patient Instructions (Addendum)
Please continue your current medications today.  Please call if any questions or concerns.    YOU HAD AN ENDOSCOPIC PROCEDURE TODAY AT THE [facility]: Refer to the procedure report that was given to you for any specific questions about what was found during the examination.  If the procedure report does not answer your questions, please call your gastroenterologist to clarify.  If you requested that your care partner not be given the details of your procedure findings, then the procedure report has been included in a sealed envelope for you to review at your convenience later.  YOU SHOULD EXPECT: Some feelings of bloating in the abdomen. Passage of more gas than usual.  Walking can help get rid of the air that was put into your GI tract during the procedure and reduce the bloating. If you had a lower endoscopy (such as a colonoscopy or flexible sigmoidoscopy) you may notice spotting of blood in your stool or on the toilet paper. If you underwent a bowel prep for your procedure, then you may not have a normal bowel movement for a few days.  DIET: Your first meal following the procedure should be a light meal and then it is ok to progress to your normal diet.  A half-sandwich or bowl of soup is an example of a good first meal.  Heavy or fried foods are harder to digest and may make you feel nauseous or bloated.  Likewise meals heavy in dairy and vegetables can cause extra gas to form and this can also increase the bloating.  Drink plenty of fluids but you should avoid alcoholic beverages for 24 hours.  ACTIVITY: Your care partner should take you home directly after the procedure.  You should plan to take it easy, moving slowly for the rest of the day.  You can resume normal activity the day after the procedure however you should NOT DRIVE or use heavy machinery for 24 hours (because of the sedation medicines used during the test).    SYMPTOMS TO REPORT IMMEDIATELY: A gastroenterologist can  be reached at any hour.  During normal business hours, 8:30 AM to 5:00 PM Monday through Friday, call [phone].  After hours and on weekends, please call the GI answering service at [phone] who will take a message and have the physician on call contact you.   Following lower endoscopy (colonoscopy or flexible sigmoidoscopy):  Excessive amounts of blood in the stool  Significant tenderness or worsening of abdominal pains  Swelling of the abdomen that is new, acute  Fever of 100F or higher   FOLLOW UP: If any biopsies were taken you will be contacted by phone or by letter within the next 1-3 weeks.  Call your gastroenterologist if you have not heard about the biopsies in 3 weeks.  Our staff will call the home number listed on your records the next business day following your procedure to check on you and address any questions or concerns that you may have at that time regarding the information given to you following your procedure. This is a courtesy call and so if there is no answer at the home number and we have not heard from you through the emergency physician on call, we will assume that you have returned to your regular daily activities without incident.  SIGNATURES/CONFIDENTIALITY: You and/or your care partner have signed paperwork which will be entered into your electronic medical record.  These signatures attest to the fact that that the information above on  your After Visit Summary has been reviewed and is understood.  Full responsibility of the confidentiality of this discharge information lies with you and/or your care-partner. 

## 2012-04-30 ENCOUNTER — Telehealth: Payer: Self-pay | Admitting: *Deleted

## 2012-04-30 ENCOUNTER — Other Ambulatory Visit: Payer: Self-pay | Admitting: *Deleted

## 2012-04-30 NOTE — Telephone Encounter (Signed)
  Follow up Call-  Call back number 04/29/2012 04/22/2012  Post procedure Call Back phone  # [phone] no machine  Permission to leave phone message Yes No     No answer and no answering machine picked up so could not leave a message

## 2012-04-30 NOTE — Progress Notes (Signed)
Informed pt's niece, Claria Dice of her Gastric Emptying Scan on 05/14/12 at 0900. She needs to arrive at [facility] at 0845am; NPO after midnight and hold stomach meds for 48 hours. Marylene Land stated understanding.

## 2012-05-04 ENCOUNTER — Telehealth: Payer: Self-pay | Admitting: *Deleted

## 2012-05-04 MED ORDER — OMEPRAZOLE 40 MG PO CPDR: 40.0000 mg | DELAYED_RELEASE_CAPSULE | Freq: Every day | ORAL | Status: DC

## 2012-05-04 NOTE — Telephone Encounter (Signed)
ANNA WITH [employer] CALLED STATING PATIENT NEEDS TO TRY OMEPRAZOLE OR PROTONIX BEFORE DEXILANT CAN BE APPROVED.   I CALLED PATIENT AND ADVISED HER THAT DEXILANT IS NOT APPROVED.   ADVISED PATIENT THAT PER HER INSURANCE SHE WILL HAVE TO TRY OMEPRAZOLE OR PROTONIX. PATIENT SAID THAT SHE WILL TRY OMEPRAZOLE.  ADVISED PATIENT THAT SHE WILL HAVE TO TRY OMEPRAZOLE FOR A MONTH AND SEE IF THAT HELPS HER.  ADVISED PATIENT THAT IM SENDING PRESCRIPTION IN NOW.  PATIENT VERBALIZED UNDERSTANDING.

## 2012-05-07 HISTORY — PX: COLONOSCOPY: SHX174

## 2012-05-14 ENCOUNTER — Encounter (HOSPITAL_COMMUNITY)
Admission: RE | Admit: 2012-05-14 | Discharge: 2012-05-14 | Disposition: A | Discharge status: Home / Self Care | Payer: PRIVATE HEALTH INSURANCE | Source: Ambulatory Visit | Attending: Gastroenterology | Admitting: Gastroenterology

## 2012-05-14 ENCOUNTER — Encounter (HOSPITAL_COMMUNITY): Payer: Self-pay

## 2012-05-14 DIAGNOSIS — R1013 Epigastric pain: Secondary | ICD-10-CM | POA: Insufficient documentation

## 2012-05-14 DIAGNOSIS — R112 Nausea with vomiting, unspecified: Secondary | ICD-10-CM

## 2012-05-14 DIAGNOSIS — R109 Unspecified abdominal pain: Secondary | ICD-10-CM | POA: Insufficient documentation

## 2012-05-14 DIAGNOSIS — K3189 Other diseases of stomach and duodenum: Secondary | ICD-10-CM | POA: Insufficient documentation

## 2012-05-14 DIAGNOSIS — E119 Type 2 diabetes mellitus without complications: Secondary | ICD-10-CM

## 2012-05-14 MED ORDER — TECHNETIUM TC 99M SULFUR COLLOID
2.1000 | Freq: Once | INTRAVENOUS | Status: AC | PRN
  Administered 2012-05-14: 2.1 via INTRAVENOUS

## 2013-02-01 ENCOUNTER — Encounter: Payer: Self-pay | Admitting: Physician Assistant

## 2013-02-01 DIAGNOSIS — E079 Disorder of thyroid, unspecified: Secondary | ICD-10-CM

## 2013-02-01 DIAGNOSIS — E559 Vitamin D deficiency, unspecified: Secondary | ICD-10-CM

## 2013-02-01 DIAGNOSIS — E785 Hyperlipidemia, unspecified: Secondary | ICD-10-CM

## 2013-02-01 DIAGNOSIS — K219 Gastro-esophageal reflux disease without esophagitis: Secondary | ICD-10-CM | POA: Insufficient documentation

## 2013-02-01 DIAGNOSIS — E039 Hypothyroidism, unspecified: Secondary | ICD-10-CM | POA: Insufficient documentation

## 2013-02-02 ENCOUNTER — Ambulatory Visit: Payer: Self-pay | Admitting: Internal Medicine

## 2013-04-05 ENCOUNTER — Other Ambulatory Visit: Payer: Self-pay | Admitting: Physician Assistant

## 2013-05-04 ENCOUNTER — Other Ambulatory Visit: Payer: Self-pay | Admitting: Physician Assistant

## 2013-05-04 DIAGNOSIS — K219 Gastro-esophageal reflux disease without esophagitis: Secondary | ICD-10-CM

## 2013-05-08 ENCOUNTER — Other Ambulatory Visit: Payer: Self-pay | Admitting: Gastroenterology

## 2013-07-27 ENCOUNTER — Encounter: Payer: Self-pay | Admitting: Internal Medicine

## 2013-07-27 ENCOUNTER — Ambulatory Visit (INDEPENDENT_AMBULATORY_CARE_PROVIDER_SITE_OTHER): Payer: Medicare Other | Admitting: Internal Medicine

## 2013-07-27 VITALS — BP 142/80 | HR 72 | Temp 97.9°F | Resp 16 | Ht 61.5 in | Wt 144.2 lb

## 2013-07-27 DIAGNOSIS — E782 Mixed hyperlipidemia: Secondary | ICD-10-CM

## 2013-07-27 DIAGNOSIS — Z1331 Encounter for screening for depression: Secondary | ICD-10-CM

## 2013-07-27 DIAGNOSIS — Z0000 Encounter for general adult medical examination without abnormal findings: Secondary | ICD-10-CM

## 2013-07-27 DIAGNOSIS — I10 Essential (primary) hypertension: Secondary | ICD-10-CM

## 2013-07-27 DIAGNOSIS — E119 Type 2 diabetes mellitus without complications: Secondary | ICD-10-CM

## 2013-07-27 DIAGNOSIS — Z79899 Other long term (current) drug therapy: Secondary | ICD-10-CM | POA: Insufficient documentation

## 2013-07-27 DIAGNOSIS — Z1212 Encounter for screening for malignant neoplasm of rectum: Secondary | ICD-10-CM

## 2013-07-27 DIAGNOSIS — E559 Vitamin D deficiency, unspecified: Secondary | ICD-10-CM

## 2013-07-27 DIAGNOSIS — Z789 Other specified health status: Secondary | ICD-10-CM

## 2013-07-27 NOTE — Progress Notes (Signed)
Patient ID: [name] [name], female   DOB: 1931-10-03, 80 y.o.   MRN: [id number]   Annual Screening Comprehensive Examination  This very nice 80 y.o.MWF presents for complete physical.  Patient has been followed for HTN, T2_NIDDM, GERD, Hypothyroidism,  Hyperlipidemia, and Vitamin D Deficiency. Patient has been lost to f/u since her last CPE 1 year ago. Patient's thyroid was elevated and she was advised to stop her thyroid med and she never returned for recheck.    HTN predates since 1995. Patient's BP has not been monitored at home. Today's BP: 142/80 mmHg. Patient denies any cardiac symptoms as chest pain, palpitations, shortness of breath, dizziness or ankle swelling.   Patient's hyperlipidemia is controlled with diet and medications. Patient denies myalgias or other medication SE's. Last lipids were Chol 259, TG 204, HDL 43 & LDL 175 in July 2014   Patient has T2_NIDDM and last A1c was 7.4% in July 2014. She is not monitoring CBG's at home. Patient denies reactive hypoglycemic symptoms, visual blurring, diabetic polys, or paresthesias.   Finally, patient has history of Vitamin D Deficiency of 27 in 2008 and last Vitamin D was 45 in July 2014.   Medication Sig  . atenolol  100 MG tablet Take 100 mg by mouth daily.  Marland Kitchen atorvastatin  80 MG tablet Take 80 mg by mouth daily.  . metFORMIN (GLUCOPHAGE) 500 MG  Take 2 tablets twice a day- takes 2-3 sporadically  . metoCLOPramide  5 MG tablet Take 5 mg  3 times daily.  Marland Kitchen omeprazole  40 MG capsule Take 1 capsule   daily.  . ranitidine  300 MG tablet TAKE 1 TABLETDAILY FOR STOMACH  . verapamil 240 MG ( 24 hr tablet Take 240 mg  at bedtime.   Allergies  Allergen Reactions  . Actos [Pioglitazone]   . Bee Venom     Eyes swell, cannot breathe  . Persantine [Dipyridamole]     "went crazy"   Past Medical History  Diagnosis Date  . Diverticulosis of colon (without mention of hemorrhage) 2009  . Colon polyp 2009     TUBULAR ADENOMA  . Hiatal hernia  2009  . Gastritis 2009  . GERD (gastroesophageal reflux disease) 2009  . Esophagitis 2009  . Diabetes   . Hyperlipemia   . Cataract   . Thyroid disease     hypothyroid  . Hypertension   . Anemia    Past Surgical History  Procedure Laterality Date  . Dilation and curettage of uterus  1984  . Breast biopsy Left 1999    neg  . Colonoscopy  05/2012    Dr. Sharlett Iles   Family History  Problem Relation Age of Onset  . Breast cancer Maternal Aunt   . Throat cancer Maternal Uncle   . Lung cancer Brother   . Prostate cancer Brother   . Diabetes Father   . Rectal cancer Neg Hx   . Stomach cancer Neg Hx   . Colon cancer Neg Hx   . Esophageal cancer Neg Hx   . Stroke Mother    History  Substance Use Topics  . Smoking status: Never Smoker   . Smokeless tobacco: Current User    Types: Snuff  . Alcohol Use: No    ROS Constitutional: Denies fever, chills, weight loss/gain, headaches, insomnia, fatigue, night sweats, and change in appetite. Eyes: Denies redness, blurred vision, diplopia, discharge, itchy, watery eyes.  ENT: Denies discharge, congestion, post nasal drip, epistaxis, sore throat, earache, hearing loss, dental pain,  Tinnitus, Vertigo, Sinus pain, snoring.  Cardio: Denies chest pain, palpitations, irregular heartbeat, syncope, dyspnea, diaphoresis, orthopnea, PND, claudication, edema Respiratory: denies cough, dyspnea, DOE, pleurisy, hoarseness, laryngitis, wheezing.  Gastrointestinal: Denies dysphagia, heartburn, reflux, water brash, pain, cramps, nausea, vomiting, bloating, diarrhea, constipation, hematemesis, melena, hematochezia, jaundice, hemorrhoids Genitourinary: Denies dysuria, frequency, urgency, nocturia, hesitancy, discharge, hematuria, flank pain Breast: Breast lumps, nipple discharge, bleeding.  Musculoskeletal: Denies arthralgia, myalgia, stiffness, Jt. Swelling, pain, limp, and strain/sprain. Denies falls. Skin: Denies puritis, rash, hives, warts, acne,  eczema, changing in skin lesion Neuro: No weakness, tremor, incoordination, spasms, paresthesia, pain Psychiatric: Denies confusion, memory loss, sensory loss. Denies Depression. Endocrine: Denies change in weight, skin, hair change, nocturia, and paresthesia, diabetic polys, visual blurring, hyper / hypo glycemic episodes.  Heme/Lymph: No excessive bleeding, bruising, enlarged lymph nodes.  Physical Exam  BP 142/80  Pulse 72  Temp(Src) 97.9 F (36.6 C) (Temporal)  Resp 16  Ht 5' 1.5" (1.562 m)  Wt 144 lb 3.2 oz (65.409 kg)  BMI 26.81 kg/m2  General Appearance: Well nourished and in no apparent distress. Eyes: PERRLA, EOMs, conjunctiva no swelling or erythema, normal fundi and vessels. Sinuses: No frontal/maxillary tenderness ENT/Mouth: EACs patent / TMs  nl. Nares clear without erythema, swelling, mucoid exudates. Oral hygiene is good. No erythema, swelling, or exudate. Tongue normal, non-obstructing. Tonsils not swollen or erythematous. Hearing normal.  Neck: Supple, thyroid normal. No bruits, nodes or JVD. Respiratory: Respiratory effort normal.  BS equal and clear bilateral without rales, rhonci, wheezing or stridor. Cardio: Heart sounds are normal with regular rate and rhythm and no murmurs, rubs or gallops. Peripheral pulses are normal and equal bilaterally without edema. No aortic or femoral bruits. Chest: symmetric with normal excursions and percussion. Breasts: Symmetric, without lumps, nipple discharge, retractions, or fibrocystic changes.  Abdomen: Flat, soft, with bowl sounds. Nontender, no guarding, rebound, hernias, masses, or organomegaly.  Lymphatics: Non tender without lymphadenopathy.  Genitourinary:  Musculoskeletal: Full ROM all peripheral extremities, joint stability, 5/5 strength, and normal gait. Skin: Warm and dry without rashes, lesions, cyanosis, clubbing or  ecchymosis.  Neuro: Cranial nerves intact, reflexes equal bilaterally. Normal muscle tone, no  cerebellar symptoms. Sensation intact.  Pysch: Awake and oriented X 3, normal affect, Insight and Judgment appropriate.   Assessment and Plan  1. Annual Screening Examination 2. Hypertension  3. Hyperlipidemia 4. T2_NIDDM 5. Vitamin D Deficiency 6. GERD 7. Hypothyroidism  Continue prudent diet as discussed, weight control, BP monitoring, regular exercise, and medications. Discussed med's effects and SE's. Screening labs and tests as requested with regular follow-up as recommended.

## 2013-07-27 NOTE — Patient Instructions (Signed)
 Recommend the book "The END of DIETING" by Dr Joel Furman   and the book "The END of DIABETES " by Dr Joel Fuhrman  At [employer] - get book & Audio CD's      Being diabetic has a  300% increased risk for heart attack, stroke, cancer, and alzheimer- type vascular dementia. It is very important that you work harder with diet by avoiding all foods that are white except chicken & fish. Avoid white rice (brown & wild rice is OK), white potatoes (sweetpotatoes in moderation is OK), White bread or wheat bread or anything made out of white flour like bagels, donuts, rolls, buns, biscuits, cakes, pastries, cookies, pizza crust, and pasta (made from white flour & egg whites) - vegetarian pasta or spinach or wheat pasta is OK. Multigrain breads like Arnold's or [employer], or multigrain sandwich thins or flatbreads.  Diet, exercise and weight loss can reverse and cure diabetes in the early stages.  Diet, exercise and weight loss is very important in the control and prevention of complications of diabetes which affects every system in your body, ie. Brain - dementia/stroke, eyes - glaucoma/blindness, heart - heart attack/heart failure, kidneys - dialysis, stomach - gastric paralysis, intestines - malabsorption, nerves - severe painful neuritis, circulation - gangrene & loss of a leg(s), and finally cancer and Alzheimers.    I recommend avoid fried & greasy foods,  sweets/candy, white rice (brown or wild rice or Quinoa is OK), white potatoes (sweet potatoes are OK) - anything made from white flour - bagels, doughnuts, rolls, buns, biscuits,white and wheat breads, pizza crust and traditional pasta made of white flour & egg white(vegetarian pasta or spinach or wheat pasta is OK).  Multi-grain bread is OK - like multi-grain flat bread or sandwich thins. Avoid alcohol in excess. Exercise is also important.    Eat all the vegetables you want - avoid meat, especially red meat and dairy - especially cheese.  Cheese  is the most concentrated form of trans-fats which is the worst thing to clog up our arteries. Veggie cheese is OK which can be found in the fresh produce section at [employer] or [employer] or [employer]    Hypertension As your heart beats, it forces blood through your arteries. This force is your blood pressure. If the pressure is too high, it is called hypertension (HTN) or high blood pressure. HTN is dangerous because you may have it and not know it. High blood pressure may mean that your heart has to work harder to pump blood. Your arteries may be narrow or stiff. The extra work puts you at risk for heart disease, stroke, and other problems.  Blood pressure consists of two numbers, a higher number over a lower, 110/72, for example. It is stated as "110 over 72." The ideal is below 120 for the top number (systolic) and under 80 for the bottom (diastolic). Write down your blood pressure today. You should pay close attention to your blood pressure if you have certain conditions such as:  Heart failure.  Prior heart attack.  Diabetes  Chronic kidney disease.  Prior stroke.  Multiple risk factors for heart disease. To see if you have HTN, your blood pressure should be measured while you are seated with your arm held at the level of the heart. It should be measured at least twice. A one-time elevated blood pressure reading (especially in the Emergency Department) does not mean that you need treatment. There may be conditions in which the   blood pressure is different between your right and left arms. It is important to see your caregiver soon for a recheck. Most people have essential hypertension which means that there is not a specific cause. This type of high blood pressure may be lowered by changing lifestyle factors such as:  Stress.  Smoking.  Lack of exercise.  Excessive weight.  Drug/tobacco/alcohol use.  Eating less salt. Most people do not have symptoms from high blood  pressure until it has caused damage to the body. Effective treatment can often prevent, delay or reduce that damage. TREATMENT  When a cause has been identified, treatment for high blood pressure is directed at the cause. There are a large number of medications to treat HTN. These fall into several categories, and your caregiver will help you select the medicines that are best for you. Medications may have side effects. You should review side effects with your caregiver. If your blood pressure stays high after you have made lifestyle changes or started on medicines,   Your medication(s) may need to be changed.  Other problems may need to be addressed.  Be certain you understand your prescriptions, and know how and when to take your medicine.  Be sure to follow up with your caregiver within the time frame advised (usually within two weeks) to have your blood pressure rechecked and to review your medications.  If you are taking more than one medicine to lower your blood pressure, make sure you know how and at what times they should be taken. Taking two medicines at the same time can result in blood pressure that is too low. SEEK IMMEDIATE MEDICAL CARE IF:  You develop a severe headache, blurred or changing vision, or confusion.  You have unusual weakness or numbness, or a faint feeling.  You have severe chest or abdominal pain, vomiting, or breathing problems. MAKE SURE YOU:   Understand these instructions.  Will watch your condition.  Will get help right away if you are not doing well or get worse.   Diabetes and Exercise Exercising regularly is important. It is not just about losing weight. It has many health benefits, such as:  Improving your overall fitness, flexibility, and endurance.  Increasing your bone density.  Helping with weight control.  Decreasing your body fat.  Increasing your muscle strength.  Reducing stress and tension.  Improving your overall  health. People with diabetes who exercise gain additional benefits because exercise:  Reduces appetite.  Improves the body's use of blood sugar (glucose).  Helps lower or control blood glucose.  Decreases blood pressure.  Helps control blood lipids (such as cholesterol and triglycerides).  Improves the body's use of the hormone insulin by:  Increasing the body's insulin sensitivity.  Reducing the body's insulin needs.  Decreases the risk for heart disease because exercising:  Lowers cholesterol and triglycerides levels.  Increases the levels of good cholesterol (such as high-density lipoproteins [HDL]) in the body.  Lowers blood glucose levels. YOUR ACTIVITY PLAN  Choose an activity that you enjoy and set realistic goals. Your health care provider or diabetes educator can help you make an activity plan that works for you. You can break activities into 2 or 3 sessions throughout the day. Doing so is as good as one long session. Exercise ideas include:  Taking the dog for a walk.  Taking the stairs instead of the elevator.  Dancing to your favorite song.  Doing your favorite exercise with a friend. RECOMMENDATIONS FOR EXERCISING WITH TYPE 1 OR   TYPE 2 DIABETES   Check your blood glucose before exercising. If blood glucose levels are greater than 240 mg/dL, check for urine ketones. Do not exercise if ketones are present.  Avoid injecting insulin into areas of the body that are going to be exercised. For example, avoid injecting insulin into:  The arms when playing tennis.  The legs when jogging.  Keep a record of:  Food intake before and after you exercise.  Expected peak times of insulin action.  Blood glucose levels before and after you exercise.  The type and amount of exercise you have done.  Review your records with your health care provider. Your health care provider will help you to develop guidelines for adjusting food intake and insulin amounts before and  after exercising.  If you take insulin or oral hypoglycemic agents, watch for signs and symptoms of hypoglycemia. They include:  Dizziness.  Shaking.  Sweating.  Chills.  Confusion.  Drink plenty of water while you exercise to prevent dehydration or heat stroke. Body water is lost during exercise and must be replaced.  Talk to your health care provider before starting an exercise program to make sure it is safe for you. Remember, almost any type of activity is better than none.    Cholesterol Cholesterol is a white, waxy, fat-like protein needed by your body in small amounts. The liver makes all the cholesterol you need. It is carried from the liver by the blood through the blood vessels. Deposits (plaque) may build up on blood vessel walls. This makes the arteries narrower and stiffer. Plaque increases the risk for heart attack and stroke. You cannot feel your cholesterol level even if it is very high. The only way to know is by a blood test to check your lipid (fats) levels. Once you know your cholesterol levels, you should keep a record of the test results. Work with your caregiver to to keep your levels in the desired range. WHAT THE RESULTS MEAN:  Total cholesterol is a rough measure of all the cholesterol in your blood.  LDL is the so-called bad cholesterol. This is the type that deposits cholesterol in the walls of the arteries. You want this level to be low.  HDL is the good cholesterol because it cleans the arteries and carries the LDL away. You want this level to be high.  Triglycerides are fat that the body can either burn for energy or store. High levels are closely linked to heart disease. DESIRED LEVELS:  Total cholesterol below 200.  LDL below 100 for people at risk, below 70 for very high risk.  HDL above 50 is good, above 60 is best.  Triglycerides below 150. HOW TO LOWER YOUR CHOLESTEROL:  Diet.  Choose fish or white meat chicken and turkey, roasted or  baked. Limit fatty cuts of red meat, fried foods, and processed meats, such as sausage and lunch meat.  Eat lots of fresh fruits and vegetables. Choose whole grains, beans, pasta, potatoes and cereals.  Use only small amounts of olive, corn or canola oils. Avoid butter, mayonnaise, shortening or palm kernel oils. Avoid foods with trans-fats.  Use skim/nonfat milk and low-fat/nonfat yogurt and cheeses. Avoid whole milk, cream, ice cream, egg yolks and cheeses. Healthy desserts include angel food cake, ginger snaps, animal crackers, hard candy, popsicles, and low-fat/nonfat frozen yogurt. Avoid pastries, cakes, pies and cookies.  Exercise.  A regular program helps decrease LDL and raises HDL.  Helps with weight control.  Do things that increase   your activity level like gardening, walking, or taking the stairs.  Medication.  May be prescribed by your caregiver to help lowering cholesterol and the risk for heart disease.  You may need medicine even if your levels are normal if you have several risk factors. HOME CARE INSTRUCTIONS   Follow your diet and exercise programs as suggested by your caregiver.  Take medications as directed.  Have blood work done when your caregiver feels it is necessary. MAKE SURE YOU:   Understand these instructions.  Will watch your condition.  Will get help right away if you are not doing well or get worse.      Vitamin D Deficiency Vitamin D is an important vitamin that your body needs. Having too little of it in your body is called a deficiency. A very bad deficiency can make your bones soft and can cause a condition called rickets.  Vitamin D is important to your body for different reasons, such as:   It helps your body absorb 2 minerals called calcium and phosphorus.  It helps make your bones healthy.  It may prevent some diseases, such as diabetes and multiple sclerosis.  It helps your muscles and heart. You can get vitamin D in several  ways. It is a natural part of some foods. The vitamin is also added to some dairy products and cereals. Some people take vitamin D supplements. Also, your body makes vitamin D when you are in the sun. It changes the sun's rays into a form of the vitamin that your body can use. CAUSES   Not eating enough foods that contain vitamin D.  Not getting enough sunlight.  Having certain digestive system diseases that make it hard to absorb vitamin D. These diseases include Crohn's disease, chronic pancreatitis, and cystic fibrosis.  Having a surgery in which part of the stomach or small intestine is removed.  Being obese. Fat cells pull vitamin D out of your blood. That means that obese people may not have enough vitamin D left in their blood and in other body tissues.  Having chronic kidney or liver disease. RISK FACTORS Risk factors are things that make you more likely to develop a vitamin D deficiency. They include:  Being older.  Not being able to get outside very much.  Living in a nursing home.  Having had broken bones.  Having weak or thin bones (osteoporosis).  Having a disease or condition that changes how your body absorbs vitamin D.  Having dark skin.  Some medicines such as seizure medicines or steroids.  Being overweight or obese. SYMPTOMS Mild cases of vitamin D deficiency may not have any symptoms. If you have a very bad case, symptoms may include:  Bone pain.  Muscle pain.  Falling often.  Broken bones caused by a minor injury, due to osteoporosis. DIAGNOSIS A blood test is the best way to tell if you have a vitamin D deficiency. TREATMENT Vitamin D deficiency can be treated in different ways. Treatment for vitamin D deficiency depends on what is causing it. Options include:  Taking vitamin D supplements.  Taking a calcium supplement. Your caregiver will suggest what dose is best for you. HOME CARE INSTRUCTIONS  Take any supplements that your caregiver  prescribes. Follow the directions carefully. Take only the suggested amount.  Have your blood tested 2 months after you start taking supplements.  Eat foods that contain vitamin D. Healthy choices include:  Fortified dairy products, cereals, or juices. Fortified means vitamin D has been   added to the food. Check the label on the package to be sure.  Fatty fish like salmon or trout.  Eggs.  Oysters.  Do not use a tanning bed.  Keep your weight at a healthy level. Lose weight if you need to.  Keep all follow-up appointments. Your caregiver will need to perform blood tests to make sure your vitamin D deficiency is going away. SEEK MEDICAL CARE IF:  You have any questions about your treatment.  You continue to have symptoms of vitamin D deficiency.  You have nausea or vomiting.  You are constipated.  You feel confused.  You have severe abdominal or back pain. MAKE SURE YOU:  Understand these instructions.  Will watch your condition.  Will get help right away if you are not doing well or get worse.   

## 2013-07-28 LAB — HEMOGLOBIN A1C
Hgb A1c MFr Bld: 7.2 % — ABNORMAL HIGH (ref <5.7)
Mean Plasma Glucose: 160 mg/dL — ABNORMAL HIGH (ref <117)

## 2013-07-28 LAB — MICROALBUMIN / CREATININE URINE RATIO
Creatinine, Urine: 135.6 mg/dL
Microalb Creat Ratio: 22.3 mg/g (ref 0.0–30.0)
Microalb, Ur: 3.02 mg/dL — ABNORMAL HIGH (ref 0.00–1.89)

## 2013-07-28 LAB — CBC WITH DIFFERENTIAL/PLATELET
Basophils Absolute: 0 10*3/uL (ref 0.0–0.1)
Basophils Relative: 0 % (ref 0–1)
Eosinophils Absolute: 0.1 10*3/uL (ref 0.0–0.7)
Eosinophils Relative: 1 % (ref 0–5)
HCT: 33.3 % — ABNORMAL LOW (ref 36.0–46.0)
Hemoglobin: 11.4 g/dL — ABNORMAL LOW (ref 12.0–15.0)
Lymphocytes Relative: 43 % (ref 12–46)
Lymphs Abs: 2.4 10*3/uL (ref 0.7–4.0)
MCH: 29.5 pg (ref 26.0–34.0)
MCHC: 34.2 g/dL (ref 30.0–36.0)
MCV: 86 fL (ref 78.0–100.0)
Monocytes Absolute: 1 10*3/uL (ref 0.1–1.0)
Monocytes Relative: 18 % — ABNORMAL HIGH (ref 3–12)
Neutro Abs: 2.1 10*3/uL (ref 1.7–7.7)
Neutrophils Relative %: 38 % — ABNORMAL LOW (ref 43–77)
Platelets: 221 10*3/uL (ref 150–400)
RBC: 3.87 MIL/uL (ref 3.87–5.11)
RDW: 14 % (ref 11.5–15.5)
WBC: 5.5 10*3/uL (ref 4.0–10.5)

## 2013-07-28 LAB — URINALYSIS, MICROSCOPIC ONLY
Bacteria, UA: NONE SEEN
Casts: NONE SEEN
Crystals: NONE SEEN
Squamous Epithelial / LPF: NONE SEEN

## 2013-07-28 LAB — BASIC METABOLIC PANEL WITH GFR
BUN: 9 mg/dL (ref 6–23)
CO2: 25 mEq/L (ref 19–32)
Calcium: 9.2 mg/dL (ref 8.4–10.5)
Chloride: 103 mEq/L (ref 96–112)
Creat: 0.64 mg/dL (ref 0.50–1.10)
GFR, Est African American: >89 mL/min
GFR, Est Non African American: 85 mL/min
Glucose, Bld: 140 mg/dL — ABNORMAL HIGH (ref 70–99)
Potassium: 3.9 mEq/L (ref 3.5–5.3)
Sodium: 137 mEq/L (ref 135–145)

## 2013-07-28 LAB — LIPID PANEL
Cholesterol: 107 mg/dL (ref 0–200)
HDL: 47 mg/dL (ref >39)
LDL Cholesterol: 46 mg/dL (ref 0–99)
Total CHOL/HDL Ratio: 2.3 Ratio
Triglycerides: 69 mg/dL (ref <150)
VLDL: 14 mg/dL (ref 0–40)

## 2013-07-28 LAB — HEPATIC FUNCTION PANEL
ALT: 11 U/L (ref 0–35)
AST: 13 U/L (ref 0–37)
Albumin: 4.2 g/dL (ref 3.5–5.2)
Alkaline Phosphatase: 46 U/L (ref 39–117)
Bilirubin, Direct: 0.1 mg/dL (ref 0.0–0.3)
Indirect Bilirubin: 0.5 mg/dL (ref 0.2–1.2)
Total Bilirubin: 0.6 mg/dL (ref 0.2–1.2)
Total Protein: 7.6 g/dL (ref 6.0–8.3)

## 2013-07-28 LAB — TSH: TSH: 0.416 u[IU]/mL (ref 0.350–4.500)

## 2013-07-28 LAB — VITAMIN D 25 HYDROXY (VIT D DEFICIENCY, FRACTURES): Vit D, 25-Hydroxy: 67 ng/mL (ref 30–89)

## 2013-07-28 LAB — MAGNESIUM: Magnesium: 1.6 mg/dL (ref 1.5–2.5)

## 2013-07-28 LAB — INSULIN, FASTING: Insulin fasting, serum: 16 u[IU]/mL (ref 3–28)

## 2013-09-03 ENCOUNTER — Encounter: Payer: Self-pay | Admitting: Gastroenterology

## 2013-10-09 ENCOUNTER — Other Ambulatory Visit: Payer: Self-pay | Admitting: Internal Medicine

## 2013-10-19 ENCOUNTER — Other Ambulatory Visit: Payer: Self-pay | Admitting: *Deleted

## 2013-10-19 MED ORDER — VERAPAMIL HCL 240 MG (CO) PO TB24: 240.0000 mg | ORAL_TABLET | Freq: Every day | ORAL | Status: DC

## 2013-11-04 ENCOUNTER — Ambulatory Visit (INDEPENDENT_AMBULATORY_CARE_PROVIDER_SITE_OTHER): Payer: Medicare Other | Admitting: Physician Assistant

## 2013-11-04 ENCOUNTER — Encounter: Payer: Self-pay | Admitting: Physician Assistant

## 2013-11-04 VITALS — BP 128/78 | HR 84 | Temp 97.9°F | Resp 16 | Ht 61.5 in | Wt 144.0 lb

## 2013-11-04 DIAGNOSIS — Z23 Encounter for immunization: Secondary | ICD-10-CM

## 2013-11-04 DIAGNOSIS — K219 Gastro-esophageal reflux disease without esophagitis: Secondary | ICD-10-CM

## 2013-11-04 DIAGNOSIS — E669 Obesity, unspecified: Secondary | ICD-10-CM

## 2013-11-04 DIAGNOSIS — E039 Hypothyroidism, unspecified: Secondary | ICD-10-CM

## 2013-11-04 DIAGNOSIS — Z789 Other specified health status: Secondary | ICD-10-CM

## 2013-11-04 DIAGNOSIS — E559 Vitamin D deficiency, unspecified: Secondary | ICD-10-CM

## 2013-11-04 DIAGNOSIS — N182 Chronic kidney disease, stage 2 (mild): Secondary | ICD-10-CM

## 2013-11-04 DIAGNOSIS — Z1331 Encounter for screening for depression: Secondary | ICD-10-CM

## 2013-11-04 DIAGNOSIS — R079 Chest pain, unspecified: Secondary | ICD-10-CM

## 2013-11-04 DIAGNOSIS — I10 Essential (primary) hypertension: Secondary | ICD-10-CM

## 2013-11-04 DIAGNOSIS — E782 Mixed hyperlipidemia: Secondary | ICD-10-CM

## 2013-11-04 DIAGNOSIS — E2839 Other primary ovarian failure: Secondary | ICD-10-CM

## 2013-11-04 DIAGNOSIS — R6889 Other general symptoms and signs: Secondary | ICD-10-CM

## 2013-11-04 DIAGNOSIS — E1122 Type 2 diabetes mellitus with diabetic chronic kidney disease: Secondary | ICD-10-CM

## 2013-11-04 DIAGNOSIS — Z79899 Other long term (current) drug therapy: Secondary | ICD-10-CM

## 2013-11-04 DIAGNOSIS — Z0001 Encounter for general adult medical examination with abnormal findings: Secondary | ICD-10-CM

## 2013-11-04 LAB — CBC WITH DIFFERENTIAL/PLATELET
Basophils Absolute: 0 10*3/uL (ref 0.0–0.1)
Basophils Relative: 0 % (ref 0–1)
Eosinophils Absolute: 0 10*3/uL (ref 0.0–0.7)
Eosinophils Relative: 0 % (ref 0–5)
HCT: 37 % (ref 36.0–46.0)
Hemoglobin: 12.6 g/dL (ref 12.0–15.0)
Lymphocytes Relative: 38 % (ref 12–46)
Lymphs Abs: 2.4 10*3/uL (ref 0.7–4.0)
MCH: 30.2 pg (ref 26.0–34.0)
MCHC: 34.1 g/dL (ref 30.0–36.0)
MCV: 88.7 fL (ref 78.0–100.0)
Monocytes Absolute: 1 10*3/uL (ref 0.1–1.0)
Monocytes Relative: 16 % — ABNORMAL HIGH (ref 3–12)
Neutro Abs: 2.9 10*3/uL (ref 1.7–7.7)
Neutrophils Relative %: 46 % (ref 43–77)
Platelets: 234 10*3/uL (ref 150–400)
RBC: 4.17 MIL/uL (ref 3.87–5.11)
RDW: 13.7 % (ref 11.5–15.5)
WBC: 6.3 10*3/uL (ref 4.0–10.5)

## 2013-11-04 MED ORDER — RANITIDINE HCL 150 MG PO TABS: 150.0000 mg | ORAL_TABLET | Freq: Two times a day (BID) | ORAL | Status: DC

## 2013-11-04 NOTE — Patient Instructions (Addendum)
Mammogram, please schedule. Get bone density with it.   Chest Pain (Nonspecific) It is often hard to give a specific diagnosis for the cause of chest pain. There is always a chance that your pain could be related to something serious, such as a heart attack or a blood clot in the lungs. You need to follow up with your health care provider for further evaluation. CAUSES   Heartburn.  Pneumonia or bronchitis.  Anxiety or stress.  Inflammation around your heart (pericarditis) or lung (pleuritis or pleurisy).  A blood clot in the lung.  A collapsed lung (pneumothorax). It can develop suddenly on its own (spontaneous pneumothorax) or from trauma to the chest.  Shingles infection (herpes zoster virus). The chest wall is composed of bones, muscles, and cartilage. Any of these can be the source of the pain.  The bones can be bruised by injury.  The muscles or cartilage can be strained by coughing or overwork.  The cartilage can be affected by inflammation and become sore (costochondritis). DIAGNOSIS  Lab tests or other studies may be needed to find the cause of your pain. Your health care provider may have you take a test called an ambulatory electrocardiogram (ECG). An ECG records your heartbeat patterns over a 24-hour period. You may also have other tests, such as:  Transthoracic echocardiogram (TTE). During echocardiography, sound waves are used to evaluate how blood flows through your heart.  Transesophageal echocardiogram (TEE).  Cardiac monitoring. This allows your health care provider to monitor your heart rate and rhythm in real time.  Holter monitor. This is a portable device that records your heartbeat and can help diagnose heart arrhythmias. It allows your health care provider to track your heart activity for several days, if needed.  Stress tests by exercise or by giving medicine that makes the heart beat faster. TREATMENT   Treatment depends on what may be causing your  chest pain. Treatment may include:  Acid blockers for heartburn.  Anti-inflammatory medicine.  Pain medicine for inflammatory conditions.  Antibiotics if an infection is present.  You may be advised to change lifestyle habits. This includes stopping smoking and avoiding alcohol, caffeine, and chocolate.  You may be advised to keep your head raised (elevated) when sleeping. This reduces the chance of acid going backward from your stomach into your esophagus. Most of the time, nonspecific chest pain will improve within 2-3 days with rest and mild pain medicine.  HOME CARE INSTRUCTIONS   If antibiotics were prescribed, take them as directed. Finish them even if you start to feel better.  For the next few days, avoid physical activities that bring on chest pain. Continue physical activities as directed.  Do not use any tobacco products, including cigarettes, chewing tobacco, or electronic cigarettes.  Avoid drinking alcohol.  Only take medicine as directed by your health care provider.  Follow your health care provider's suggestions for further testing if your chest pain does not go away.  Keep any follow-up appointments you made. If you do not go to an appointment, you could develop lasting (chronic) problems with pain. If there is any problem keeping an appointment, call to reschedule. SEEK MEDICAL CARE IF:   Your chest pain does not go away, even after treatment.  You have a rash with blisters on your chest.  You have a fever. SEEK IMMEDIATE MEDICAL CARE IF:   You have increased chest pain or pain that spreads to your arm, neck, jaw, back, or abdomen.  You have shortness of  breath.  You have an increasing cough, or you cough up blood.  You have severe back or abdominal pain.  You feel nauseous or vomit.  You have severe weakness.  You faint.  You have chills. This is an emergency. Do not wait to see if the pain will go away. Get medical help at once. Call your  local emergency services (911 in [place] Do not drive yourself to the hospital. MAKE SURE YOU:   Understand these instructions.  Will watch your condition.  Will get help right away if you are not doing well or get worse. Document Released: 10/03/2004 Document Revised: 12/29/2012 Document Reviewed: 07/30/2007 [facility] Patient Information 2015 [place], [place]. This information is not intended to replace advice given to you by your health care provider. Make sure you discuss any questions you have with your health care provider.  Preventative Care for Adults - Female      MAINTAIN REGULAR HEALTH EXAMS:  A routine yearly physical is a good way to check in with your primary care provider about your health and preventive screening. It is also an opportunity to share updates about your health and any concerns you have, and receive a thorough all-over exam.   Most health insurance companies pay for at least some preventative services.  Check with your health plan for specific coverages.  WHAT PREVENTATIVE SERVICES DO WOMEN NEED?  Adult women should have their weight and blood pressure checked regularly.   Women age 45 and older should have their cholesterol levels checked regularly.  Women should be screened for cervical cancer with a Pap smear and pelvic exam beginning at either age 69, or 3 years after they become sexually activity.    Breast cancer screening generally begins at age 42 with a mammogram and breast exam by your primary care provider.    Beginning at age 81 and continuing to age 75, women should be screened for colorectal cancer.  Certain people may need continued testing until age 17.  Updating vaccinations is part of preventative care.  Vaccinations help protect against diseases such as the flu.  Osteoporosis is a disease in which the bones lose minerals and strength as we age. Women ages 68 and over should discuss this with their caregivers, as should women after menopause  who have other risk factors.  Lab tests are generally done as part of preventative care to screen for anemia and blood disorders, to screen for problems with the kidneys and liver, to screen for bladder problems, to check blood sugar, and to check your cholesterol level.  Preventative services generally include counseling about diet, exercise, avoiding tobacco, drugs, excessive alcohol consumption, and sexually transmitted infections.    GENERAL RECOMMENDATIONS FOR GOOD HEALTH:  Healthy diet:  Eat a variety of foods, including fruit, vegetables, animal or vegetable protein, such as meat, fish, chicken, and eggs, or beans, lentils, tofu, and grains, such as rice.  Drink plenty of water daily.  Decrease saturated fat in the diet, avoid lots of red meat, processed foods, sweets, fast foods, and fried foods.  Exercise:  Aerobic exercise helps maintain good heart health. At least 30-40 minutes of moderate-intensity exercise is recommended. For example, a brisk walk that increases your heart rate and breathing. This should be done on most days of the week.   Find a type of exercise or a variety of exercises that you enjoy so that it becomes a part of your daily life.  Examples are running, walking, swimming, water aerobics, and biking.  For motivation and support, explore group exercise such as aerobic class, spin class, Zumba, Yoga,or  martial arts, etc.    Set exercise goals for yourself, such as a certain weight goal, walk or run in a race such as a 5k walk/run.  Speak to your primary care provider about exercise goals.  Disease prevention:  If you smoke or chew tobacco, find out from your caregiver how to quit. It can literally save your life, no matter how long you have been a tobacco user. If you do not use tobacco, never begin.   Maintain a healthy diet and normal weight. Increased weight leads to problems with blood pressure and diabetes.   The Body Mass Index or BMI is a way of  measuring how much of your body is fat. Having a BMI above 27 increases the risk of heart disease, diabetes, hypertension, stroke and other problems related to obesity. Your caregiver can help determine your BMI and based on it develop an exercise and dietary program to help you achieve or maintain this important measurement at a healthful level.  High blood pressure causes heart and blood vessel problems.  Persistent high blood pressure should be treated with medicine if weight loss and exercise do not work.   Fat and cholesterol leaves deposits in your arteries that can block them. This causes heart disease and vessel disease elsewhere in your body.  If your cholesterol is found to be high, or if you have heart disease or certain other medical conditions, then you may need to have your cholesterol monitored frequently and be treated with medication.   Ask if you should have a cardiac stress test if your history suggests this. A stress test is a test done on a treadmill that looks for heart disease. This test can find disease prior to there being a problem.  Menopause can be associated with physical symptoms and risks. Hormone replacement therapy is available to decrease these. You should talk to your caregiver about whether starting or continuing to take hormones is right for you.   Osteoporosis is a disease in which the bones lose minerals and strength as we age. This can result in serious bone fractures. Risk of osteoporosis can be identified using a bone density scan. Women ages 24 and over should discuss this with their caregivers, as should women after menopause who have other risk factors. Ask your caregiver whether you should be taking a calcium supplement and Vitamin D, to reduce the rate of osteoporosis.   Avoid drinking alcohol in excess (more than two drinks per day).  Avoid use of street drugs. Do not share needles with anyone. Ask for professional help if you need assistance or  instructions on stopping the use of alcohol, cigarettes, and/or drugs.  Brush your teeth twice a day with fluoride toothpaste, and floss once a day. Good oral hygiene prevents tooth decay and gum disease. The problems can be painful, unattractive, and can cause other health problems. Visit your dentist for a routine oral and dental check up and preventive care every 6-12 months.   Look at your skin regularly.  Use a mirror to look at your back. Notify your caregivers of changes in moles, especially if there are changes in shapes, colors, a size larger than a pencil eraser, an irregular border, or development of new moles.  Safety:  Use seatbelts 100% of the time, whether driving or as a passenger.  Use safety devices such as hearing protection if you work in environments  with loud noise or significant background noise.  Use safety glasses when doing any work that could send debris in to the eyes.  Use a helmet if you ride a bike or motorcycle.  Use appropriate safety gear for contact sports.  Talk to your caregiver about gun safety.  Use sunscreen with a SPF (or skin protection factor) of 15 or greater.  Lighter skinned people are at a greater risk of skin cancer. Don't forget to also wear sunglasses in order to protect your eyes from too much damaging sunlight. Damaging sunlight can accelerate cataract formation.   Practice safe sex. Use condoms. Condoms are used for birth control and to help reduce the spread of sexually transmitted infections (or STIs).  Some of the STIs are gonorrhea (the clap), chlamydia, syphilis, trichomonas, herpes, HPV (human papilloma virus) and HIV (human immunodeficiency virus) which causes AIDS. The herpes, HIV and HPV are viral illnesses that have no cure. These can result in disability, cancer and death.   Keep carbon monoxide and smoke detectors in your home functioning at all times. Change the batteries every 6 months or use a model that plugs into the wall.    Vaccinations:  Stay up to date with your tetanus shots and other required immunizations. You should have a booster for tetanus every 10 years. Be sure to get your flu shot every year, since 5%-20% of the [place] population comes down with the flu. The flu vaccine changes each year, so being vaccinated once is not enough. Get your shot in the fall, before the flu season peaks.   Other vaccines to consider:  Human Papilloma Virus or HPV causes cancer of the cervix, and other infections that can be transmitted from person to person. There is a vaccine for HPV, and females should get immunized between the ages of 9 and 8. It requires a series of 3 shots.   Pneumococcal vaccine to protect against certain types of pneumonia.  This is normally recommended for adults age 107 or older.  However, adults younger than 78 years old with certain underlying conditions such as diabetes, heart or lung disease should also receive the vaccine.  Shingles vaccine to protect against Varicella Zoster if you are older than age 67, or younger than 78 years old with certain underlying illness.  Hepatitis A vaccine to protect against a form of infection of the liver by a virus acquired from food.  Hepatitis B vaccine to protect against a form of infection of the liver by a virus acquired from blood or body fluids, particularly if you work in health care.  If you plan to travel internationally, check with your local health department for specific vaccination recommendations.  Cancer Screening:  Breast cancer screening is essential to preventive care for women. All women age 67 and older should perform a breast self-exam every month. At age 39 and older, women should have their caregiver complete a breast exam each year. Women at ages 66 and older should have a mammogram (x-ray film) of the breasts. Your caregiver can discuss how often you need mammograms.    Cervical cancer screening includes taking a Pap smear (sample  of cells examined under a microscope) from the cervix (end of the uterus). It also includes testing for HPV (Human Papilloma Virus, which can cause cervical cancer). Screening and a pelvic exam should begin at age 34, or 3 years after a woman becomes sexually active. Screening should occur every year, with a Pap smear but no HPV testing,  up to age 78. After age 1, you should have a Pap smear every 3 years with HPV testing, if no HPV was found previously.   Most routine colon cancer screening begins at the age of 39. On a yearly basis, doctors may provide special easy to use take-home tests to check for hidden blood in the stool. Sigmoidoscopy or colonoscopy can detect the earliest forms of colon cancer and is life saving. These tests use a small camera at the end of a tube to directly examine the colon. Speak to your caregiver about this at age 15, when routine screening begins (and is repeated every 5 years unless early forms of pre-cancerous polyps or small growths are found).

## 2013-11-04 NOTE — Progress Notes (Signed)
MEDICARE ANNUAL WELLNESS VISIT AND FOLLOW UP  Assessment:   1. Essential hypertension - CBC with Differential - BASIC METABOLIC PANEL WITH GFR - Hepatic function panel  2. Hypothyroidism, unspecified hypothyroidism type Was taken off medications- this could be contributing to weakness, check level.  - TSH  3. Gastroesophageal reflux disease without esophagitis Get on Zantac  4. Encounter for long-term (current) use of medications - Magnesium  5. Hyperlipidemia - Lipid panel  6. Obesity Obesity with co morbidities- long discussion about weight loss, diet, and exercise  7. Vitamin D deficiency - Vit D  25 hydroxy (rtn osteoporosis monitoring)  8. CKD stage 2 due to type 2 diabetes mellitus Discussed general issues about diabetes pathophysiology and management., Educational material distributed., Suggested low cholesterol diet., Encouraged aerobic exercise., Discussed foot care., Reminded to get yearly retinal exam. - Hemoglobin A1c - Insulin, fasting - HM DIABETES FOOT EXAM  9. Estrogen deficiency - DG Bone Density; Future  10. Chest pain, unspecified chest pain type 80 year old diabetic, smokeless tobacco using female with strong family history with an episode of exertional chest pain with SOB, dizziness, and continuing weakness. No changes on EKG however she is very high risk, weill refer back to cardio. Continue bASA, if anymore CP, SOB, dizziness she needs to go to the ER. Check labs, rule out anemia, electrolyte balance, put on H2 blocker.  - EKG 12-Lead - Ambulatory referral to Cardiology  11. Need for prophylactic vaccination against Streptococcus pneumoniae (pneumococcus) Will get high dose flu, we are out of in the office - Pneumococcal conjugate vaccine 13-valent IM  12. Encounter for general adult medical examination with abnormal findings  13. Screening for depression negative  14. Patient had no falls in past year  OVER 40 minutes of exam, counseling,  chart review, referral performed   Plan:   During the course of the visit the patient was educated and counseled about appropriate screening and preventive services including:    Pneumococcal vaccine   Influenza vaccine  Td vaccine  Screening electrocardiogram  Screening mammography  Bone densitometry screening  Colorectal cancer screening  Diabetes screening  Glaucoma screening  Nutrition counseling   Advanced directives: given info/requested  Screening recommendations, referrals:  Vaccinations: Please see documentation below and orders this visit.   Nutrition assessed and recommended  Colonoscopy up to date Mammogram ordered Pap smear not indicated Pelvic exam not indicated Recommended yearly ophthalmology/optometry visit for glaucoma screening and checkup Recommended yearly dental visit for hygiene and checkup Advanced directives - requested  Conditions/risks identified: BMI: Discussed weight loss, diet, and increase physical activity.  Increase physical activity: AHA recommends 150 minutes of physical activity a week.  Medications reviewed DEXA- ordered Diabetes is not at goal, ACE/ARB therapy: No, Reason not on Ace Inhibitor/ARB therapy:  declines Urinary Incontinence is not an issue: discussed non pharmacology and pharmacology options.  Fall risk: low- discussed PT, home fall assessment, medications.    Subjective:   [name] [name] is a 80 y.o. female who presents for Medicare Annual Wellness Visit and 3 month follow up on hypertension, diabetes, hyperlipidemia, vitamin D def.  Date of last medicare wellness visit is unknown.   Her blood pressure has been controlled at home, today their BP is BP: 128/78 mmHg She does not workout. She denies chest pain, shortness of breath, dizziness.  She is on cholesterol medication and denies myalgias. Her cholesterol is at goal. The cholesterol last visit was:   Lab Results  Component Value Date  CHOL 107  07/27/2013   HDL 47 07/27/2013   LDLCALC 46 07/27/2013   TRIG 69 07/27/2013   CHOLHDL 2.3 07/27/2013   She has been working on diet and exercise for diabetes with CKD, she is on MF, she does not check her sugars, she is on bASA, not on ACE, and denies paresthesia of the feet, polydipsia, polyuria and visual disturbances. Last A1C in the office was:  Lab Results  Component Value Date   HGBA1C 7.2* 07/27/2013   Patient is on Vitamin D supplement. Lab Results  Component Value Date   VD25OH 67 07/27/2013     She is on thyroid medication. Her medication was discontinued last visit. Patient denies diarrhea, heat / cold intolerance, nervousness and palpitations. + change in energy level.   Lab Results  Component Value Date   TSH 0.416 07/27/2013  .  She states in July she was going around to different yard sales, after carrying some items to the car on the way home she started to have central chest pain without radiation, nausea with vomiting with shortness of breath, weakness, she went to bed. She propped herself up on pillows to sleep better that night. Since that time she has felt weak, dizziness in the morning, states "she cant do what she did before." Denies dark black stool, palpitations, burping/water brash, heart burn. Very inactive.   Names of Other Physician/Practitioners you currently use: 1. [facility]- here for primary care 2. [facility], eye doctor, last visit unknown 3. No dentist Patient Care Team: [name], MD as PCP - General (Internal Medicine) Josephina Gip, MD as Consulting Physician (Optometry) Lelon Perla, MD as Consulting Physician (Cardiology) Sable Feil, MD as Consulting Physician (Gastroenterology)  Medication Review Current Outpatient Prescriptions on File Prior to Visit  Medication Sig Dispense Refill  . atenolol (TENORMIN) 100 MG tablet Take 100 mg by mouth daily.      Marland Kitchen atorvastatin (LIPITOR) 80 MG tablet  TAKE 1 TABLET DAILY FOR CHOLESTEROL  90 tablet  3  . metFORMIN (GLUCOPHAGE) 500 MG tablet Take 500 mg by mouth. Take 2 tablets twice a day      . verapamil (COVERA HS) 240 MG (CO) 24 hr tablet Take 1 tablet (240 mg total) by mouth at bedtime.  90 tablet  1   No current facility-administered medications on file prior to visit.    Current Problems (verified) Patient Active Problem List   Diagnosis Date Noted  . Encounter for long-term (current) use of medications 07/27/2013  . Vitamin D deficiency 02/01/2013  . GERD (gastroesophageal reflux disease)   . Hypothyroidism   . CKD stage 2 due to type 2 diabetes mellitus 06/04/2007  . Hyperlipidemia 06/04/2007  . Obesity 06/04/2007  . Essential hypertension 06/04/2007    Screening Tests Health Maintenance  Topic Date Due  . Ophthalmology Exam  10/02/1943  . Zostavax  10/01/1993  . Influenza Vaccine  08/07/2013  . Hemoglobin A1c  01/27/2014  . Foot Exam  07/28/2014  . Urine Microalbumin  07/28/2014  . Tetanus/tdap  02/02/2016  . Colonoscopy  04/30/2022  . Pneumococcal Polysaccharide Vaccine Age 73 And Over  Completed     Immunization History  Administered Date(s) Administered  . Influenza-Unspecified 11/18/2012  . Pneumococcal Polysaccharide-23 07/25/2010  . Pneumococcal-Unspecified 02/01/2001  . Td 02/01/2006    Preventative care: Last colonoscopy: 2014 Last mammogram: 2007 Last pap smear/pelvic exam: 2012 DEXA:2007 Cardiolite 2003 normal EGD 05/2012  Prior vaccinations: TD or Tdap:  2008  Influenza: 2014 Pneumococcal: 2012 Prevnar 13: Shingles/Zostavax:   History reviewed: allergies, current medications, past family history, past medical history, past social history, past surgical history and problem list  Risk Factors: Osteoporosis/FallRisk: postmenopausal estrogen deficiency and dietary calcium and/or vitamin D deficiency In the past year have you fallen or had a near fall?:No History of fracture in the past  year: no  Tobacco History  Substance Use Topics  . Smoking status: Never Smoker   . Smokeless tobacco: Current User    Types: Snuff  . Alcohol Use: No   She does not smoke.  Patient is not a former smoker. But she does use smokeless tobacco Are there smokers in your home (other than you)?  No  Alcohol Current alcohol use: none  Caffeine Current caffeine use: coffee 2 /day  Exercise Current exercise: none  Nutrition/Diet Current diet: in general, a "healthy" diet    Cardiac risk factors: advanced age (older than 7 for men, 74 for women), diabetes mellitus, dyslipidemia, family history of premature cardiovascular disease, hypertension, sedentary lifestyle and smoking/ tobacco exposure.  Depression Screen (Note: if answer to either of the following is "Yes", a more complete depression screening is indicated)   Q1: Over the past two weeks, have you felt down, depressed or hopeless? No  Q2: Over the past two weeks, have you felt little interest or pleasure in doing things? No  Have you lost interest or pleasure in daily life? No  Do you often feel hopeless? No  Do you cry easily over simple problems? No  Activities of Daily Living In your present state of health, do you have any difficulty performing the following activities?:  Driving? No Managing money?  No Feeding yourself? No Getting from bed to chair? No Climbing a flight of stairs? Yes Preparing food and eating?: No Bathing or showering? No Getting dressed: No Getting to the toilet? No Using the toilet:No Moving around from place to place: Yes   Are you sexually active?  No  Do you have more than one partner?  No  Vision Difficulties: No  Hearing Difficulties: No Do you often ask people to speak up or repeat themselves? No Do you experience ringing or noises in your ears? No Do you have difficulty understanding soft or whispered voices? No  Cognition  Do you feel that you have a problem with  memory?No  Do you often misplace items? No  Do you feel safe at home?  Yes  Advanced directives Does patient have a [place]? No Does patient have a Living Will? No   Objective:   Blood pressure 128/78, pulse 84, temperature 97.9 F (36.6 C), resp. rate 16, height 5' 1.5" (1.562 m), weight 144 lb (65.318 kg). Body mass index is 26.77 kg/(m^2).  General appearance: alert, no distress, WD/WN,  female Cognitive Testing  Alert? Yes  Normal Appearance?Yes  Oriented to person? Yes  Place? Yes   Time? Yes  Recall of three objects?  Yes  Can perform simple calculations? Yes  Displays appropriate judgment?Yes  Can read the correct time from a watch face?Yes  HEENT: normocephalic, sclerae anicteric, TMs pearly, nares patent, no discharge or erythema, pharynx normal Oral cavity: MMM, no lesions Neck: supple, no lymphadenopathy, no thyromegaly, no masses Heart: RRR, normal S1, S2, with 2/6 holosystolic Lungs: CTA bilaterally, no wheezes, rhonchi, or rales Abdomen: +bs, soft, non tender, non distended, no masses, no hepatomegaly, no splenomegaly Musculoskeletal: nontender, no swelling, no obvious deformity Extremities: no  edema, no cyanosis, no clubbing Pulses: 2+ symmetric, upper and lower extremities, normal cap refill Neurological: alert, oriented x 3, CN2-12 intact, strength normal upper extremities and lower extremities, sensation normal throughout, DTRs 2+ throughout, no cerebellar signs, gait normal Psychiatric: normal affect, behavior normal, pleasant  Breast: defer Gyn: defer Rectal: defer  Medicare Attestation I have personally reviewed: The patient's medical and social history Their use of alcohol, tobacco or illicit drugs Their current medications and supplements The patient's functional ability including ADLs,fall risks, home safety risks, cognitive, and hearing and visual impairment Diet and physical activities Evidence for depression or mood  disorders  The patient's weight, height, BMI, and visual acuity have been recorded in the chart.  I have made referrals, counseling, and provided education to the patient based on review of the above and I have provided the patient with a written personalized care plan for preventive services.     Vicie Mutters, PA-C   11/04/2013

## 2013-11-05 LAB — VITAMIN D 25 HYDROXY (VIT D DEFICIENCY, FRACTURES): Vit D, 25-Hydroxy: 61 ng/mL (ref 30–89)

## 2013-11-05 LAB — BASIC METABOLIC PANEL WITH GFR
BUN: 11 mg/dL (ref 6–23)
CO2: 27 mEq/L (ref 19–32)
Calcium: 9.3 mg/dL (ref 8.4–10.5)
Chloride: 101 mEq/L (ref 96–112)
Creat: 0.73 mg/dL (ref 0.50–1.10)
GFR, Est African American: >89 mL/min
GFR, Est Non African American: 78 mL/min
Glucose, Bld: 119 mg/dL — ABNORMAL HIGH (ref 70–99)
Potassium: 4 mEq/L (ref 3.5–5.3)
Sodium: 139 mEq/L (ref 135–145)

## 2013-11-05 LAB — HEPATIC FUNCTION PANEL
ALT: 11 U/L (ref 0–35)
AST: 14 U/L (ref 0–37)
Albumin: 4.8 g/dL (ref 3.5–5.2)
Alkaline Phosphatase: 54 U/L (ref 39–117)
Bilirubin, Direct: 0.1 mg/dL (ref 0.0–0.3)
Indirect Bilirubin: 0.4 mg/dL (ref 0.2–1.2)
Total Bilirubin: 0.5 mg/dL (ref 0.2–1.2)
Total Protein: 7.9 g/dL (ref 6.0–8.3)

## 2013-11-05 LAB — LIPID PANEL
Cholesterol: 141 mg/dL (ref 0–200)
HDL: 50 mg/dL (ref >39)
LDL Cholesterol: 56 mg/dL (ref 0–99)
Total CHOL/HDL Ratio: 2.8 Ratio
Triglycerides: 173 mg/dL — ABNORMAL HIGH (ref <150)
VLDL: 35 mg/dL (ref 0–40)

## 2013-11-05 LAB — HEMOGLOBIN A1C
Hgb A1c MFr Bld: 7.6 % — ABNORMAL HIGH (ref <5.7)
Mean Plasma Glucose: 171 mg/dL — ABNORMAL HIGH (ref <117)

## 2013-11-05 LAB — INSULIN, FASTING: Insulin fasting, serum: 7.3 u[IU]/mL (ref 2.0–19.6)

## 2013-11-05 LAB — MAGNESIUM: Magnesium: 2.2 mg/dL (ref 1.5–2.5)

## 2013-11-05 LAB — TSH: TSH: 0.776 u[IU]/mL (ref 0.350–4.500)

## 2013-11-09 ENCOUNTER — Other Ambulatory Visit: Payer: Self-pay

## 2013-11-09 DIAGNOSIS — Z1231 Encounter for screening mammogram for malignant neoplasm of breast: Secondary | ICD-10-CM

## 2013-11-09 NOTE — Progress Notes (Signed)
Patient scheduled at [facility] on 11-11-13 at 8:45. No calcium 24 hours prior. Patient aware of appointment and instructions.

## 2013-11-11 ENCOUNTER — Ambulatory Visit (HOSPITAL_COMMUNITY)
Admission: RE | Admit: 2013-11-11 | Discharge: 2013-11-11 | Disposition: A | Discharge status: Home / Self Care | Payer: PRIVATE HEALTH INSURANCE | Source: Ambulatory Visit | Attending: Physician Assistant | Admitting: Physician Assistant

## 2013-11-11 DIAGNOSIS — Z13820 Encounter for screening for osteoporosis: Secondary | ICD-10-CM | POA: Diagnosis not present

## 2013-11-11 DIAGNOSIS — E2839 Other primary ovarian failure: Secondary | ICD-10-CM

## 2013-11-11 DIAGNOSIS — Z780 Asymptomatic menopausal state: Secondary | ICD-10-CM | POA: Insufficient documentation

## 2013-12-13 ENCOUNTER — Other Ambulatory Visit: Payer: Self-pay | Admitting: *Deleted

## 2013-12-13 MED ORDER — METFORMIN HCL 500 MG PO TABS: 1000.0000 mg | ORAL_TABLET | Freq: Two times a day (BID) | ORAL | Status: DC

## 2013-12-20 ENCOUNTER — Other Ambulatory Visit: Payer: Self-pay | Admitting: *Deleted

## 2013-12-20 MED ORDER — METFORMIN HCL ER 500 MG PO TB24: 1000.0000 mg | ORAL_TABLET | Freq: Two times a day (BID) | ORAL | Status: DC

## 2014-02-02 ENCOUNTER — Ambulatory Visit: Payer: Self-pay | Admitting: Internal Medicine

## 2014-02-07 ENCOUNTER — Ambulatory Visit: Payer: Self-pay | Admitting: Internal Medicine

## 2014-02-24 ENCOUNTER — Ambulatory Visit (INDEPENDENT_AMBULATORY_CARE_PROVIDER_SITE_OTHER): Payer: Medicare Other | Admitting: Internal Medicine

## 2014-02-24 ENCOUNTER — Encounter: Payer: Self-pay | Admitting: Internal Medicine

## 2014-02-24 VITALS — BP 108/64 | HR 88 | Temp 97.9°F | Resp 16 | Ht 61.0 in | Wt 140.0 lb

## 2014-02-24 DIAGNOSIS — E559 Vitamin D deficiency, unspecified: Secondary | ICD-10-CM

## 2014-02-24 DIAGNOSIS — Z1331 Encounter for screening for depression: Secondary | ICD-10-CM

## 2014-02-24 DIAGNOSIS — E669 Obesity, unspecified: Secondary | ICD-10-CM

## 2014-02-24 DIAGNOSIS — I10 Essential (primary) hypertension: Secondary | ICD-10-CM

## 2014-02-24 DIAGNOSIS — E039 Hypothyroidism, unspecified: Secondary | ICD-10-CM

## 2014-02-24 DIAGNOSIS — N182 Chronic kidney disease, stage 2 (mild): Secondary | ICD-10-CM

## 2014-02-24 DIAGNOSIS — Z79899 Other long term (current) drug therapy: Secondary | ICD-10-CM

## 2014-02-24 DIAGNOSIS — Z9181 History of falling: Secondary | ICD-10-CM

## 2014-02-24 DIAGNOSIS — E1122 Type 2 diabetes mellitus with diabetic chronic kidney disease: Secondary | ICD-10-CM

## 2014-02-24 DIAGNOSIS — Z0000 Encounter for general adult medical examination without abnormal findings: Secondary | ICD-10-CM

## 2014-02-24 DIAGNOSIS — E782 Mixed hyperlipidemia: Secondary | ICD-10-CM

## 2014-02-24 NOTE — Patient Instructions (Signed)

## 2014-02-25 ENCOUNTER — Other Ambulatory Visit: Payer: Self-pay | Admitting: Internal Medicine

## 2014-02-25 LAB — CBC WITH DIFFERENTIAL/PLATELET
Basophils Absolute: 0 10*3/uL (ref 0.0–0.1)
Basophils Relative: 0 % (ref 0–1)
Eosinophils Absolute: 0 10*3/uL (ref 0.0–0.7)
Eosinophils Relative: 0 % (ref 0–5)
HCT: 34 % — ABNORMAL LOW (ref 36.0–46.0)
Hemoglobin: 11.8 g/dL — ABNORMAL LOW (ref 12.0–15.0)
Lymphocytes Relative: 31 % (ref 12–46)
Lymphs Abs: 2.2 10*3/uL (ref 0.7–4.0)
MCH: 30 pg (ref 26.0–34.0)
MCHC: 34.7 g/dL (ref 30.0–36.0)
MCV: 86.5 fL (ref 78.0–100.0)
MPV: 9.6 fL (ref 8.6–12.4)
Monocytes Absolute: 1 10*3/uL (ref 0.1–1.0)
Monocytes Relative: 14 % — ABNORMAL HIGH (ref 3–12)
Neutro Abs: 3.9 10*3/uL (ref 1.7–7.7)
Neutrophils Relative %: 55 % (ref 43–77)
Platelets: 284 10*3/uL (ref 150–400)
RBC: 3.93 MIL/uL (ref 3.87–5.11)
RDW: 14.3 % (ref 11.5–15.5)
WBC: 7.1 10*3/uL (ref 4.0–10.5)

## 2014-02-25 LAB — HEPATIC FUNCTION PANEL
ALT: 10 U/L (ref 0–35)
AST: 12 U/L (ref 0–37)
Albumin: 4.1 g/dL (ref 3.5–5.2)
Alkaline Phosphatase: 58 U/L (ref 39–117)
Bilirubin, Direct: 0.1 mg/dL (ref 0.0–0.3)
Indirect Bilirubin: 0.3 mg/dL (ref 0.2–1.2)
Total Bilirubin: 0.4 mg/dL (ref 0.2–1.2)
Total Protein: 7.5 g/dL (ref 6.0–8.3)

## 2014-02-25 LAB — BASIC METABOLIC PANEL WITH GFR
BUN: 7 mg/dL (ref 6–23)
CO2: 27 mEq/L (ref 19–32)
Calcium: 9.4 mg/dL (ref 8.4–10.5)
Chloride: 104 mEq/L (ref 96–112)
Creat: 0.58 mg/dL (ref 0.50–1.10)
GFR, Est African American: >89 mL/min
GFR, Est Non African American: 87 mL/min
Glucose, Bld: 143 mg/dL — ABNORMAL HIGH (ref 70–99)
Potassium: 4.4 mEq/L (ref 3.5–5.3)
Sodium: 139 mEq/L (ref 135–145)

## 2014-02-25 LAB — HEMOGLOBIN A1C
Hgb A1c MFr Bld: 7.4 % — ABNORMAL HIGH (ref <5.7)
Mean Plasma Glucose: 166 mg/dL — ABNORMAL HIGH (ref <117)

## 2014-02-25 LAB — INSULIN, FASTING: Insulin fasting, serum: 11.6 u[IU]/mL (ref 2.0–19.6)

## 2014-02-25 LAB — VITAMIN D 25 HYDROXY (VIT D DEFICIENCY, FRACTURES): Vit D, 25-Hydroxy: 52 ng/mL (ref 30–100)

## 2014-02-25 LAB — TSH: TSH: 0.718 u[IU]/mL (ref 0.350–4.500)

## 2014-02-25 LAB — LIPID PANEL
Cholesterol: 130 mg/dL (ref 0–200)
HDL: 52 mg/dL (ref >39)
LDL Cholesterol: 59 mg/dL (ref 0–99)
Total CHOL/HDL Ratio: 2.5 Ratio
Triglycerides: 94 mg/dL (ref <150)
VLDL: 19 mg/dL (ref 0–40)

## 2014-02-25 LAB — MAGNESIUM: Magnesium: 2 mg/dL (ref 1.5–2.5)

## 2014-02-27 ENCOUNTER — Encounter: Payer: Self-pay | Admitting: Internal Medicine

## 2014-02-27 NOTE — Progress Notes (Signed)
Patient ID: [name] [name], female   DOB: Oct 17, 1931, 81 y.o.   MRN: [id number]  MEDICARE ANNUAL WELLNESS VISIT AND OV  Assessment:   1. Essential hypertension   2. Hyperlipidemia  - Lipid panel  3. Obesity   4. CKD stage 2 due to type 2 diabetes mellitus  - Hemoglobin A1c - Insulin, fasting  5. Hypothyroidism, unspecified hypothyroidism type  - TSH  6. Vitamin D deficiency  - Vit D  25 hydroxy (rtn osteoporosis monitoring)  7. Encounter for long-term (current) use of medications  - CBC with Differential/Platelet - BASIC METABOLIC PANEL WITH GFR - Hepatic function panel - Magnesium  Plan:   During the course of the visit the patient was educated and counseled about appropriate screening and preventive services including:    Pneumococcal vaccine   Influenza vaccine  Td vaccine  Screening electrocardiogram  Bone densitometry screening  Colorectal cancer screening  Diabetes screening  Glaucoma screening  Nutrition counseling   Advanced directives: requested  Screening recommendations, referrals: Vaccinations:  Immunization History  Administered Date(s) Administered  . Influenza-Unspecified 11/18/2012  . Pneumococcal Conjugate-13 11/04/2013  . Pneumococcal Polysaccharide-23 07/25/2010  . Pneumococcal-Unspecified 02/01/2001  . Td 02/01/2006   Hep B vaccine not indicated  Nutrition assessed and recommended  Colonoscopy 04/29/2012 - Dr Sharlett Iles Recommended yearly ophthalmology/optometry visit for glaucoma screening and checkup Recommended yearly dental visit for hygiene and checkup Advanced directives - no - offered forms  Conditions/risks identified: BMI: Discussed weight loss, diet, and increase physical activity.  Increase physical activity: AHA recommends 150 minutes of physical activity a week.  Medications reviewed Diabetes is not at goal, ACE/ARB therapy: No Urinary Incontinence is not an issue: discussed non pharmacology and  pharmacology options.  Fall risk: low- discussed PT, home fall assessment, medications.   Subjective:   [name] is a 81 y.o. MWF who presents for Medicare Annual Wellness Visit and OV.  Date of last medicare wellness visit was 11/04/2013.  She has had elevated blood pressure since 1995. Her blood pressure has been controlled at home, & today their BP is BP: 108/64 mmHg She does not workout. She denies chest pain, shortness of breath, dizziness.  She is on cholesterol medication and denies myalgias. Her cholesterol is at goal. The cholesterol last visit was:  Lab Results  Component Value Date   CHOL 130 02/24/2014   HDL 52 02/24/2014   LDLCALC 59 02/24/2014   TRIG 94 02/24/2014   CHOLHDL 2.5 02/24/2014    She has had diabetes for 10 years (1996). She has not been working on diet and exercise and denies foot ulcerations, hyperglycemia, hypoglycemia , paresthesia of the feet, polydipsia, polyuria and visual disturbances. Last A1C in the office was:  Lab Results  Component Value Date   HGBA1C 7.4* 02/24/2014    Patient is on Vitamin D supplement.  (Vit D 27 in 2008) Lab Results  Component Value Date   VD25OH 52 02/24/2014      Names of Other Physician/Practitioners you currently use: 1. [facility] here for primary care 2. Dr Katy Fitch, eye doctor, last visit 2015 3. No Dentist, - dentures  Patient Care Team: [name], MD as PCP - General (Internal Medicine) Josephina Gip, MD as Consulting Physician (Optometry) Lelon Perla, MD as Consulting Physician (Cardiology) Sable Feil, MD as Consulting Physician (Gastroenterology)  Medication Review: Medication Sig  . atenolol (TENORMIN) 100 MG tablet Take 100 mg by mouth daily.  Marland Kitchen atorvastatin (LIPITOR) 80  MG tablet TAKE 1 TABLET DAILY FOR CHOLESTEROL  . metFORMIN (GLUCOPHAGE-XR) 500 MG 24 hr tablet Take 2 tablets (1,000 mg total) by mouth 2 (two) times daily.  .  ranitidine (ZANTAC) 150 MG tablet Take 1 tablet (150 mg total) by mouth 2 (two) times daily.  . verapamil (COVERA HS) 240 MG (CO) 24 hr tablet Take 1 tablet (240 mg total) by mouth at bedtime.   Current Problems (verified) Patient Active Problem List   Diagnosis Date Noted  . Encounter for long-term (current) use of medications 07/27/2013  . Vitamin D deficiency 02/01/2013  . GERD (gastroesophageal reflux disease)   . Hypothyroidism   . CKD stage 2 due to type 2 diabetes mellitus 06/04/2007  . Hyperlipidemia 06/04/2007  . Obesity 06/04/2007  . Essential hypertension 06/04/2007   Screening Tests Health Maintenance  Topic Date Due  . OPHTHALMOLOGY EXAM  10/02/1943  . ZOSTAVAX  10/01/1993  . INFLUENZA VACCINE  08/07/2013  . URINE MICROALBUMIN  07/28/2014  . HEMOGLOBIN A1C  08/25/2014  . FOOT EXAM  11/05/2014  . TETANUS/TDAP  02/02/2016  . COLONOSCOPY  04/30/2022  . DEXA SCAN  Completed  . PNEUMOCOCCAL POLYSACCHARIDE VACCINE AGE 66 AND OVER  Completed   Immunization History  Administered Date(s) Administered  . Influenza-Unspecified 11/18/2012  . Pneumococcal Conjugate-13 11/04/2013  . Pneumococcal Polysaccharide-23 07/25/2010  . Pneumococcal-Unspecified 02/01/2001  . Td 02/01/2006   Preventative care: Last colonoscopy: 04/29/2012  History reviewed: allergies, current medications, past family history, past medical history, past social history, past surgical history and problem list  Risk Factors: Tobacco History  Substance Use Topics  . Smoking status: Never Smoker   . Smokeless tobacco: Current User    Types: Snuff  . Alcohol Use: No   She does not smoke, but does dip snuff.  Patient is not a former smoker. Are there smokers in your home (other than you)?  No Alcohol Current alcohol use: none  Caffeine Current caffeine use: coffee 2 cups /day  Exercise Current exercise: housecleaning, walking and yard work  Nutrition/Diet Current diet: in general, a  "healthy" diet    Cardiac risk factors: advanced age (older than 48 for men, 75 for women), diabetes mellitus, dyslipidemia, hypertension, obesity (BMI >= 30 kg/m2) and sedentary lifestyle.  Depression Screen (Note: if answer to either of the following is "Yes", a more complete depression screening is indicated)   Q1: Over the past two weeks, have you felt down, depressed or hopeless? No  Q2: Over the past two weeks, have you felt little interest or pleasure in doing things? No  Have you lost interest or pleasure in daily life? No  Do you often feel hopeless? No  Do you cry easily over simple problems? No  Activities of Daily Living In your present state of health, do you have any difficulty performing the following activities?:  Driving? No Managing money?  No Feeding yourself? No Getting from bed to chair? No Climbing a flight of stairs? No Preparing food and eating?: No Bathing or showering? No Getting dressed: No Getting to the toilet? No Using the toilet:No Moving around from place to place: No In the past year have you fallen or had a near fall?:No   Are you sexually active?  No  Do you have more than one partner?  No  Vision Difficulties: No  Hearing Difficulties: No Do you often ask people to speak up or repeat themselves? No Do you experience ringing or noises in your ears? No Do you have  difficulty understanding soft or whispered voices? Sometimes.  Cognition  Do you feel that you have a problem with memory?No  Do you often misplace items? No  Do you feel safe at home?  Yes  Advanced directives Does patient have a [place]? No - offered forms Does patient have a Living Will? No - offered forms  Past Medical History  Diagnosis Date  . Diverticulosis of colon (without mention of hemorrhage) 2009  . Colon polyp 2009     TUBULAR ADENOMA  . Hiatal hernia 2009  . Gastritis 2009  . GERD (gastroesophageal reflux disease) 2009  .  Esophagitis 2009  . Diabetes   . Hyperlipemia   . Cataract   . Thyroid disease     hypothyroid  . Hypertension   . Anemia    Past Surgical History  Procedure Laterality Date  . Dilation and curettage of uterus  1984  . Breast biopsy Left 1999    neg  . Colonoscopy  05/2012    Dr. Sharlett Iles   ROS: Constitutional: Denies fever, chills, weight loss/gain, headaches, insomnia, fatigue, night sweats, and change in appetite. Eyes: Denies redness, blurred vision, diplopia, discharge, itchy, watery eyes.  ENT: Denies discharge, congestion, post nasal drip, epistaxis, sore throat, earache, hearing loss, dental pain, Tinnitus, Vertigo, Sinus pain, snoring.  Cardio: Denies chest pain, palpitations, irregular heartbeat, syncope, dyspnea, diaphoresis, orthopnea, PND, claudication, edema Respiratory: denies cough, dyspnea, DOE, pleurisy, hoarseness, laryngitis, wheezing.  Gastrointestinal: Denies dysphagia, heartburn, reflux, water brash, pain, cramps, nausea, vomiting, bloating, diarrhea, constipation, hematemesis, melena, hematochezia, jaundice, hemorrhoids Genitourinary: Denies dysuria, frequency, urgency, nocturia, hesitancy, discharge, hematuria, flank pain Breast: Breast lumps, nipple discharge, bleeding.  Musculoskeletal: Denies arthralgia, myalgia, stiffness, Jt. Swelling, pain, limp, and strain/sprain. Denies falls. Skin: Denies puritis, rash, hives, warts, acne, eczema, changing in skin lesion Neuro: No weakness, tremor, incoordination, spasms, paresthesia, pain Psychiatric: Denies confusion, memory loss, sensory loss. Denies Depression. Endocrine: Denies change in weight, skin, hair change, nocturia, and paresthesia, diabetic polys, visual blurring, hyper / hypo glycemic episodes.  Heme/Lymph: No excessive bleeding, bruising, enlarged lymph nodes  Objective:     BP 108/64   Pulse 88  Temp 97.9 F Resp 16  Ht 5\' 1"    Wt 140 lb     BMI 26.47   General Appearance: Well nourished,  alert, WD/WN, female and in no apparent distress. Eyes: PERRLA, EOMs, conjunctiva no swelling or erythema, normal fundi and vessels. Sinuses: No frontal/maxillary tenderness ENT/Mouth: EACs patent / TMs  nl. Nares clear without erythema, swelling, mucoid exudates. Oral hygiene is good. No erythema, swelling, or exudate. Tongue normal, non-obstructing. Tonsils not swollen or erythematous. Hearing normal.  Neck: Supple, thyroid normal. No bruits, nodes or JVD. Respiratory: Respiratory effort normal.  BS equal and clear bilateral without rales, rhonci, wheezing or stridor. Cardio: Heart sounds are normal with regular rate and rhythm and no murmurs, rubs or gallops. Peripheral pulses are normal and equal bilaterally without edema. No aortic or femoral bruits. Chest: symmetric with normal excursions and percussion. Breasts: Symmetric, without lumps, nipple discharge, retractions, or fibrocystic changes.  Abdomen: Flat, soft  with nl bowel sounds. Nontender, no guarding, rebound, hernias, masses, or organomegaly.  Lymphatics: Non tender without lymphadenopathy.  Genitourinary:  Musculoskeletal: Full ROM all peripheral extremities, joint stability, 5/5 strength, and normal gait. Skin: Warm and dry without rashes, lesions, cyanosis, clubbing or  ecchymosis.  Neuro: Cranial nerves intact, reflexes equal bilaterally. Normal muscle tone, no cerebellar symptoms. Sensation intact.  Pysch: Awake  and oriented X 3, normal affect, Insight and Judgment appropriate.   Cognitive Testing  Alert? Yes  Normal Appearance?Yes  Oriented to person? Yes  Place? Yes   Time? Yes  Recall of three objects?  Yes  Can perform simple calculations? Yes  Displays appropriate judgment? Yes  Can read the correct time from a watch/clock?Yes  Medicare Attestation I have personally reviewed: The patient's medical and social history Their use of alcohol, tobacco or illicit drugs Their current medications and supplements The  patient's functional ability including ADLs,fall risks, home safety risks, cognitive, and hearing and visual impairment Diet and physical activities Evidence for depression or mood disorders  The patient's weight, height, BMI, and visual acuity have been recorded in the chart.  I have made referrals, counseling, and provided education to the patient based on review of the above and I have provided the patient with a written personalized care plan for preventive services.    Darryn Kydd DAVID, MD   02/27/2014

## 2014-02-28 ENCOUNTER — Other Ambulatory Visit: Payer: Self-pay | Admitting: Internal Medicine

## 2014-03-14 ENCOUNTER — Other Ambulatory Visit: Payer: Self-pay | Admitting: *Deleted

## 2014-03-14 MED ORDER — VERAPAMIL HCL 240 MG (CO) PO TB24: 240.0000 mg | ORAL_TABLET | Freq: Every day | ORAL | Status: DC

## 2014-03-14 MED ORDER — ATORVASTATIN CALCIUM 80 MG PO TABS: 80.0000 mg | ORAL_TABLET | Freq: Every day | ORAL | Status: DC

## 2014-03-15 ENCOUNTER — Other Ambulatory Visit: Payer: Self-pay | Admitting: *Deleted

## 2014-03-15 MED ORDER — VERAPAMIL HCL ER 240 MG PO TBCR: 240.0000 mg | EXTENDED_RELEASE_TABLET | Freq: Every day | ORAL | Status: DC

## 2014-05-25 ENCOUNTER — Ambulatory Visit (INDEPENDENT_AMBULATORY_CARE_PROVIDER_SITE_OTHER): Payer: Medicare Other | Admitting: Physician Assistant

## 2014-05-25 ENCOUNTER — Encounter: Payer: Self-pay | Admitting: Physician Assistant

## 2014-05-25 VITALS — BP 114/72 | HR 94 | Temp 97.8°F | Resp 16 | Ht 61.0 in | Wt 141.0 lb

## 2014-05-25 DIAGNOSIS — E559 Vitamin D deficiency, unspecified: Secondary | ICD-10-CM

## 2014-05-25 DIAGNOSIS — E1122 Type 2 diabetes mellitus with diabetic chronic kidney disease: Secondary | ICD-10-CM

## 2014-05-25 DIAGNOSIS — Z79899 Other long term (current) drug therapy: Secondary | ICD-10-CM

## 2014-05-25 DIAGNOSIS — E669 Obesity, unspecified: Secondary | ICD-10-CM

## 2014-05-25 DIAGNOSIS — E782 Mixed hyperlipidemia: Secondary | ICD-10-CM

## 2014-05-25 DIAGNOSIS — I10 Essential (primary) hypertension: Secondary | ICD-10-CM

## 2014-05-25 DIAGNOSIS — N182 Chronic kidney disease, stage 2 (mild): Secondary | ICD-10-CM

## 2014-05-25 DIAGNOSIS — E039 Hypothyroidism, unspecified: Secondary | ICD-10-CM

## 2014-05-25 MED ORDER — PROMETHAZINE HCL 12.5 MG PO TABS
12.5000 mg | ORAL_TABLET | Freq: Three times a day (TID) | ORAL | Status: DC | PRN
Start: 2014-05-25 — End: 2014-10-03

## 2014-05-25 NOTE — Patient Instructions (Signed)
Can try to decrease atenolol to 1/2 pill at night to see if this helps with dizziness, monitor heart rate though  Dizziness Dizziness is a common problem. It is a feeling of unsteadiness or light-headedness. You may feel like you are about to faint. Dizziness can lead to injury if you stumble or fall. A person of any age group can suffer from dizziness, but dizziness is more common in older adults. CAUSES  Dizziness can be caused by many different things, including:  Middle ear problems.  Standing for too long.  Infections.  An allergic reaction.  Aging.  An emotional response to something, such as the sight of blood.  Side effects of medicines.  Tiredness.  Problems with circulation or blood pressure.  Excessive use of alcohol or medicines, or illegal drug use.  Breathing too fast (hyperventilation).  An irregular heart rhythm (arrhythmia).  A low red blood cell count (anemia).  Pregnancy.  Vomiting, diarrhea, fever, or other illnesses that cause body fluid loss (dehydration).  Diseases or conditions such as Parkinson's disease, high blood pressure (hypertension), diabetes, and thyroid problems.  Exposure to extreme heat. DIAGNOSIS  Your health care provider will ask about your symptoms, perform a physical exam, and perform an electrocardiogram (ECG) to record the electrical activity of your heart. Your health care provider may also perform other heart or blood tests to determine the cause of your dizziness. These may include:  Transthoracic echocardiogram (TTE). During echocardiography, sound waves are used to evaluate how blood flows through your heart.  Transesophageal echocardiogram (TEE).  Cardiac monitoring. This allows your health care provider to monitor your heart rate and rhythm in real time.  Holter monitor. This is a portable device that records your heartbeat and can help diagnose heart arrhythmias. It allows your health care provider to track your heart  activity for several days if needed.  Stress tests by exercise or by giving medicine that makes the heart beat faster. TREATMENT  Treatment of dizziness depends on the cause of your symptoms and can vary greatly. HOME CARE INSTRUCTIONS   Drink enough fluids to keep your urine clear or pale yellow. This is especially important in very hot weather. In older adults, it is also important in cold weather.  Take your medicine exactly as directed if your dizziness is caused by medicines. When taking blood pressure medicines, it is especially important to get up slowly.  Rise slowly from chairs and steady yourself until you feel okay.  In the morning, first sit up on the side of the bed. When you feel okay, stand slowly while holding onto something until you know your balance is fine.  Move your legs often if you need to stand in one place for a long time. Tighten and relax your muscles in your legs while standing.  Have someone stay with you for 1-2 days if dizziness continues to be a problem. Do this until you feel you are well enough to stay alone. Have the person call your health care provider if he or she notices changes in you that are concerning.  Do not drive or use heavy machinery if you feel dizzy.  Do not drink alcohol. SEEK IMMEDIATE MEDICAL CARE IF:   Your dizziness or light-headedness gets worse.  You feel nauseous or vomit.  You have problems talking, walking, or using your arms, hands, or legs.  You feel weak.  You are not thinking clearly or you have trouble forming sentences. It may take a friend or family member  to notice this.  You have chest pain, abdominal pain, shortness of breath, or sweating.  Your vision changes.  You notice any bleeding.  You have side effects from medicine that seems to be getting worse rather than better. MAKE SURE YOU:   Understand these instructions.  Will watch your condition.  Will get help right away if you are not doing well  or get worse. Document Released: 06/19/2000 Document Revised: 12/29/2012 Document Reviewed: 07/13/2010 [facility] Patient Information 2015 [place], [place]. This information is not intended to replace advice given to you by your health care provider. Make sure you discuss any questions you have with your health care provider.   Your A1C is a measure of your sugar over the past 3 months and is not affected by what you have eaten over the past few days. Diabetes increases your chances of stroke and heart attack over 300 % and is the leading cause of blindness and kidney failure in the [place]. Please make sure you decrease bad carbs like white bread, white rice, potatoes, corn, soft drinks, pasta, cereals, refined sugars, sweet tea, dried fruits, and fruit juice. Good carbs are okay to eat in moderation like sweet potatoes, brown rice, whole grain pasta/bread, most fruit (except dried fruit) and you can eat as many veggies as you want.   Greater than 6.5 is considered diabetic. Between 6.4 and 5.7 is prediabetic If your A1C is less than 5.7 you are NOT diabetic.  Targets for Glucose Readings: Time of Check Target for patients WITHOUT Diabetes Target for DIABETICS  Before Meals Less than 100  less than 150  Two hours after meals Less than 200  Less than 250    Recommendations For Diabetic/Prediabetic Patients:   -  Take medications as prescribed  -  Recommend Dr Fara Olden Fuhrman's book "The End of Diabetes "  And "The End of Dieting"- Can get at  www.[facility].com and encourage also get the Audio CD book  - AVOID Animal products, ie. Meat - red/white, Poultry and Dairy/especially cheese - Exercise at least 5 times a week for 30 minutes or preferably daily.  - No Smoking - Drink less than 2 drinks a day.  - Monitor your feet for sores - Have yearly Eye Exams - Recommend annual Flu vaccine  - Recommend Pneumovax and Prevnar vaccines - Shingles Vaccine (Zostavax) if over 22 y.o.  Goals:   - BMI  less than 24 - Fasting sugar less than 130 or less than 150 if tapering medicines to lose weight  - Systolic BP less than 161  - Diastolic BP less than 80 - Bad LDL Cholesterol less than 70 - Triglycerides less than 150

## 2014-05-25 NOTE — Progress Notes (Signed)
Assessment and Plan:  1. Hypertension -Continue medication, she has had some dizziness without CP, SOB, palpitations, normal neuro- will check labs and may decrease atenolol to 1/2 pill PM, and monitor blood pressure and HR at home. Continue DASH diet.  Reminder to go to the ER if any CP, SOB, nausea, dizziness, severe HA, changes vision/speech, left arm numbness and tingling and jaw pain.  2. Cholesterol -Continue diet and exercise. Check cholesterol.   3. Diabetes with diabetic chronic kidney disease -Continue diet and exercise. Check A1C  4. Vitamin D Def - check level and continue medications.   Continue diet and meds as discussed. Further disposition pending results of labs. Discussed med's effects and SE's.   Over 30 minutes of exam, counseling, chart review, and critical decision making was performed   HPI 81 y.o. female  presents for 3 month follow up on hypertension, cholesterol, diabetes and vitamin D deficiency.   Her blood pressure has been controlled at home, today her BP is BP: 114/72 mmHg.  She does not workout. She denies chest pain, shortness of breath, dizziness.  She is on cholesterol medication, lipitor 80 and denies myalgias. Her cholesterol is not at goal. The cholesterol was:   Lab Results  Component Value Date   CHOL 130 02/24/2014   HDL 52 02/24/2014   LDLCALC 59 02/24/2014   TRIG 94 02/24/2014   CHOLHDL 2.5 02/24/2014    She has been working on diet and exercise for diabetes with diabetic chronic kidney disease, she is on bASA, she is not on ACE/ARB, she is on 4 metformin a day, she checks her sugars 3-4 times a week, and denies  paresthesia of the feet, polydipsia, polyuria and visual disturbances. Last A1C was:  Lab Results  Component Value Date   HGBA1C 7.4* 02/24/2014    Patient is on Vitamin D supplement. Lab Results  Component Value Date   VD25OH 52 02/24/2014     Current Medications:  Current Outpatient Prescriptions on File Prior to Visit   Medication Sig Dispense Refill  . atenolol (TENORMIN) 100 MG tablet Take 100 mg by mouth daily.    Marland Kitchen atorvastatin (LIPITOR) 80 MG tablet Take 1 tablet (80 mg total) by mouth daily. 90 tablet 3  . metFORMIN (GLUCOPHAGE-XR) 500 MG 24 hr tablet Take 2 tablets (1,000 mg total) by mouth 2 (two) times daily. 360 tablet 3  . ranitidine (ZANTAC) 150 MG tablet Take 1 tablet (150 mg total) by mouth 2 (two) times daily. 60 tablet 1  . verapamil (CALAN-SR) 240 MG CR tablet Take 1 tablet (240 mg total) by mouth daily. 90 tablet 4   No current facility-administered medications on file prior to visit.   Medical History:  Past Medical History  Diagnosis Date  . Diverticulosis of colon (without mention of hemorrhage) 2009  . Colon polyp 2009     TUBULAR ADENOMA  . Hiatal hernia 2009  . Gastritis 2009  . GERD (gastroesophageal reflux disease) 2009  . Esophagitis 2009  . Diabetes   . Hyperlipemia   . Cataract   . Thyroid disease     hypothyroid  . Hypertension   . Anemia    Allergies:  Allergies  Allergen Reactions  . Actos [Pioglitazone]   . Bee Venom     Eyes swell, cannot breathe  . Persantine [Dipyridamole]     "went crazy"     Review of Systems:  Review of Systems  Constitutional: Negative.   HENT: Negative.   Eyes: Negative.  Respiratory: Negative.  Negative for cough and shortness of breath.   Cardiovascular: Negative.  Negative for chest pain, palpitations, leg swelling and PND.  Gastrointestinal: Negative.   Genitourinary: Negative.   Musculoskeletal: Negative.   Skin: Negative.   Neurological: Positive for dizziness. Negative for tingling, tremors, sensory change, speech change, focal weakness, seizures and loss of consciousness.  Endo/Heme/Allergies: Negative.   Psychiatric/Behavioral: Negative.     Family history- Review and unchanged Social history- Review and unchanged Physical Exam: BP 114/72 mmHg  Pulse 94  Temp(Src) 97.8 F (36.6 C) (Temporal)  Resp 16   Ht 5\' 1"  (1.549 m)  Wt 141 lb (63.957 kg)  BMI 26.66 kg/m2 Wt Readings from Last 3 Encounters:  05/25/14 141 lb (63.957 kg)  02/24/14 140 lb (63.504 kg)  11/04/13 144 lb (65.318 kg)   HEENT: normocephalic, sclerae anicteric, TMs pearly, nares patent, no discharge or erythema, pharynx normal Oral cavity: MMM, no lesions Neck: supple, no lymphadenopathy, no thyromegaly, no masses Heart: RRR, normal S1, S2, with 2/6 holosystolic Lungs: CTA bilaterally, no wheezes, rhonchi, or rales Abdomen: +bs, soft, non tender, non distended, no masses, no hepatomegaly, no splenomegaly Musculoskeletal: nontender, no swelling, no obvious deformity Extremities: no edema, no cyanosis, no clubbing Pulses: 2+ symmetric, upper and lower extremities, normal cap refill Neurological: alert, oriented x 3, CN2-12 intact, strength normal upper extremities and lower extremities, sensation normal throughout, DTRs 2+ throughout, no cerebellar signs, gait normal Psychiatric: normal affect, behavior normal, pleasant   Vicie Mutters, PA-C 2:56 PM [facility]

## 2014-05-26 LAB — BASIC METABOLIC PANEL WITH GFR
BUN: 7 mg/dL (ref 6–23)
CO2: 24 mEq/L (ref 19–32)
Calcium: 9.3 mg/dL (ref 8.4–10.5)
Chloride: 104 mEq/L (ref 96–112)
Creat: 0.58 mg/dL (ref 0.50–1.10)
GFR, Est African American: >89 mL/min
GFR, Est Non African American: 87 mL/min
Glucose, Bld: 204 mg/dL — ABNORMAL HIGH (ref 70–99)
Potassium: 4 mEq/L (ref 3.5–5.3)
Sodium: 138 mEq/L (ref 135–145)

## 2014-05-26 LAB — CBC WITH DIFFERENTIAL/PLATELET
Basophils Absolute: 0 10*3/uL (ref 0.0–0.1)
Basophils Relative: 0 % (ref 0–1)
Eosinophils Absolute: 0 10*3/uL (ref 0.0–0.7)
Eosinophils Relative: 0 % (ref 0–5)
HCT: 34.8 % — ABNORMAL LOW (ref 36.0–46.0)
Hemoglobin: 11.7 g/dL — ABNORMAL LOW (ref 12.0–15.0)
Lymphocytes Relative: 48 % — ABNORMAL HIGH (ref 12–46)
Lymphs Abs: 2.2 10*3/uL (ref 0.7–4.0)
MCH: 29 pg (ref 26.0–34.0)
MCHC: 33.6 g/dL (ref 30.0–36.0)
MCV: 86.1 fL (ref 78.0–100.0)
MPV: 10.1 fL (ref 8.6–12.4)
Monocytes Absolute: 0.6 10*3/uL (ref 0.1–1.0)
Monocytes Relative: 14 % — ABNORMAL HIGH (ref 3–12)
Neutro Abs: 1.7 10*3/uL (ref 1.7–7.7)
Neutrophils Relative %: 38 % — ABNORMAL LOW (ref 43–77)
Platelets: 263 10*3/uL (ref 150–400)
RBC: 4.04 MIL/uL (ref 3.87–5.11)
RDW: 14.7 % (ref 11.5–15.5)
WBC: 4.6 10*3/uL (ref 4.0–10.5)

## 2014-05-26 LAB — HEPATIC FUNCTION PANEL
ALT: <8 U/L (ref 0–35)
AST: 11 U/L (ref 0–37)
Albumin: 4.1 g/dL (ref 3.5–5.2)
Alkaline Phosphatase: 48 U/L (ref 39–117)
Bilirubin, Direct: 0.1 mg/dL (ref 0.0–0.3)
Indirect Bilirubin: 0.2 mg/dL (ref 0.2–1.2)
Total Bilirubin: 0.3 mg/dL (ref 0.2–1.2)
Total Protein: 7.8 g/dL (ref 6.0–8.3)

## 2014-05-26 LAB — HEMOGLOBIN A1C
Hgb A1c MFr Bld: 7.7 % — ABNORMAL HIGH (ref <5.7)
Mean Plasma Glucose: 174 mg/dL — ABNORMAL HIGH (ref <117)

## 2014-05-26 LAB — LIPID PANEL
Cholesterol: 197 mg/dL (ref 0–200)
HDL: 54 mg/dL (ref >=46)
LDL Cholesterol: 122 mg/dL — ABNORMAL HIGH (ref 0–99)
Total CHOL/HDL Ratio: 3.6 Ratio
Triglycerides: 103 mg/dL (ref <150)
VLDL: 21 mg/dL (ref 0–40)

## 2014-05-26 LAB — VITAMIN D 25 HYDROXY (VIT D DEFICIENCY, FRACTURES): Vit D, 25-Hydroxy: 53 ng/mL (ref 30–100)

## 2014-05-26 LAB — TSH: TSH: 0.62 u[IU]/mL (ref 0.350–4.500)

## 2014-05-26 LAB — MAGNESIUM: Magnesium: 1.9 mg/dL (ref 1.5–2.5)

## 2014-06-16 ENCOUNTER — Other Ambulatory Visit: Payer: Self-pay | Admitting: *Deleted

## 2014-06-16 MED ORDER — VERAPAMIL HCL ER 240 MG PO TBCR: 240.0000 mg | EXTENDED_RELEASE_TABLET | Freq: Every day | ORAL | Status: DC

## 2014-06-22 ENCOUNTER — Other Ambulatory Visit: Payer: Self-pay | Admitting: Internal Medicine

## 2014-07-25 ENCOUNTER — Other Ambulatory Visit: Payer: Self-pay | Admitting: *Deleted

## 2014-07-25 MED ORDER — VERAPAMIL HCL ER 240 MG PO TBCR: 240.0000 mg | EXTENDED_RELEASE_TABLET | Freq: Every day | ORAL | Status: DC

## 2014-08-04 ENCOUNTER — Encounter: Payer: Self-pay | Admitting: Internal Medicine

## 2014-09-11 ENCOUNTER — Other Ambulatory Visit: Payer: Self-pay | Admitting: Internal Medicine

## 2014-10-03 ENCOUNTER — Encounter: Payer: Self-pay | Admitting: Internal Medicine

## 2014-10-03 ENCOUNTER — Ambulatory Visit (INDEPENDENT_AMBULATORY_CARE_PROVIDER_SITE_OTHER): Payer: Medicare Other | Admitting: Internal Medicine

## 2014-10-03 VITALS — BP 132/68 | HR 68 | Temp 97.4°F | Resp 16 | Ht 61.0 in | Wt 129.8 lb

## 2014-10-03 DIAGNOSIS — Z79899 Other long term (current) drug therapy: Secondary | ICD-10-CM | POA: Diagnosis not present

## 2014-10-03 DIAGNOSIS — I10 Essential (primary) hypertension: Secondary | ICD-10-CM | POA: Diagnosis not present

## 2014-10-03 DIAGNOSIS — Z1389 Encounter for screening for other disorder: Secondary | ICD-10-CM

## 2014-10-03 DIAGNOSIS — K219 Gastro-esophageal reflux disease without esophagitis: Secondary | ICD-10-CM | POA: Diagnosis not present

## 2014-10-03 DIAGNOSIS — Z9181 History of falling: Secondary | ICD-10-CM

## 2014-10-03 DIAGNOSIS — E669 Obesity, unspecified: Secondary | ICD-10-CM

## 2014-10-03 DIAGNOSIS — Z23 Encounter for immunization: Secondary | ICD-10-CM | POA: Diagnosis not present

## 2014-10-03 DIAGNOSIS — Z1331 Encounter for screening for depression: Secondary | ICD-10-CM

## 2014-10-03 DIAGNOSIS — E559 Vitamin D deficiency, unspecified: Secondary | ICD-10-CM | POA: Diagnosis not present

## 2014-10-03 DIAGNOSIS — E039 Hypothyroidism, unspecified: Secondary | ICD-10-CM | POA: Diagnosis not present

## 2014-10-03 DIAGNOSIS — Z6824 Body mass index (BMI) 24.0-24.9, adult: Secondary | ICD-10-CM

## 2014-10-03 DIAGNOSIS — Z789 Other specified health status: Secondary | ICD-10-CM | POA: Diagnosis not present

## 2014-10-03 DIAGNOSIS — E782 Mixed hyperlipidemia: Secondary | ICD-10-CM

## 2014-10-03 DIAGNOSIS — E1122 Type 2 diabetes mellitus with diabetic chronic kidney disease: Secondary | ICD-10-CM | POA: Diagnosis not present

## 2014-10-03 DIAGNOSIS — N182 Chronic kidney disease, stage 2 (mild): Secondary | ICD-10-CM

## 2014-10-03 DIAGNOSIS — Z1212 Encounter for screening for malignant neoplasm of rectum: Secondary | ICD-10-CM

## 2014-10-03 LAB — CBC WITH DIFFERENTIAL/PLATELET
Basophils Absolute: 0 10*3/uL (ref 0.0–0.1)
Basophils Relative: 0 % (ref 0–1)
Eosinophils Absolute: 0 10*3/uL (ref 0.0–0.7)
Eosinophils Relative: 1 % (ref 0–5)
HCT: 36.2 % (ref 36.0–46.0)
Hemoglobin: 12.4 g/dL (ref 12.0–15.0)
Lymphocytes Relative: 53 % — ABNORMAL HIGH (ref 12–46)
Lymphs Abs: 2.2 10*3/uL (ref 0.7–4.0)
MCH: 29.7 pg (ref 26.0–34.0)
MCHC: 34.3 g/dL (ref 30.0–36.0)
MCV: 86.6 fL (ref 78.0–100.0)
MPV: 9.4 fL (ref 8.6–12.4)
Monocytes Absolute: 0.6 10*3/uL (ref 0.1–1.0)
Monocytes Relative: 14 % — ABNORMAL HIGH (ref 3–12)
Neutro Abs: 1.3 10*3/uL — ABNORMAL LOW (ref 1.7–7.7)
Neutrophils Relative %: 32 % — ABNORMAL LOW (ref 43–77)
Platelets: 240 10*3/uL (ref 150–400)
RBC: 4.18 MIL/uL (ref 3.87–5.11)
RDW: 15.3 % (ref 11.5–15.5)
WBC: 4.1 10*3/uL (ref 4.0–10.5)

## 2014-10-03 LAB — HEPATIC FUNCTION PANEL
ALT: 9 U/L (ref 6–29)
AST: 14 U/L (ref 10–35)
Albumin: 4.1 g/dL (ref 3.6–5.1)
Alkaline Phosphatase: 50 U/L (ref 33–130)
Bilirubin, Direct: 0.1 mg/dL (ref <=0.2)
Indirect Bilirubin: 0.4 mg/dL (ref 0.2–1.2)
Total Bilirubin: 0.5 mg/dL (ref 0.2–1.2)
Total Protein: 7.7 g/dL (ref 6.1–8.1)

## 2014-10-03 LAB — MAGNESIUM: Magnesium: 1.8 mg/dL (ref 1.5–2.5)

## 2014-10-03 LAB — LIPID PANEL
Cholesterol: 213 mg/dL — ABNORMAL HIGH (ref 125–200)
HDL: 59 mg/dL (ref >=46)
LDL Cholesterol: 121 mg/dL (ref <130)
Total CHOL/HDL Ratio: 3.6 Ratio (ref <=5.0)
Triglycerides: 167 mg/dL — ABNORMAL HIGH (ref <150)
VLDL: 33 mg/dL — ABNORMAL HIGH (ref <30)

## 2014-10-03 LAB — BASIC METABOLIC PANEL WITH GFR
BUN: 6 mg/dL — ABNORMAL LOW (ref 7–25)
CO2: 25 mmol/L (ref 20–31)
Calcium: 9.3 mg/dL (ref 8.6–10.4)
Chloride: 106 mmol/L (ref 98–110)
Creat: 0.63 mg/dL (ref 0.60–0.88)
GFR, Est African American: >89 mL/min (ref >=60)
GFR, Est Non African American: 84 mL/min (ref >=60)
Glucose, Bld: 133 mg/dL — ABNORMAL HIGH (ref 65–99)
Potassium: 3.8 mmol/L (ref 3.5–5.3)
Sodium: 138 mmol/L (ref 135–146)

## 2014-10-03 NOTE — Patient Instructions (Signed)
Recommend Adult Low dose Aspirin or   coated  Aspirin 81 mg daily   To reduce risk of Colon Cancer 20 %,   Skin Cancer 26 % ,   Melanoma 46%   and   Pancreatic cancer 60%  ++++++++++++++++++  Vitamin D goal   is between 70-100.   Please make sure that you are taking your Vitamin D as directed.   It is very important as a natural anti-inflammatory   helping hair, skin, and nails, as well as reducing stroke and heart attack risk.   It helps your bones and helps with mood.  It also decreases numerous cancer risks so please take it as directed.   Low Vit D is associated with a 200-300% higher risk for CANCER   and 200-300% higher risk for HEART   ATTACK  &  STROKE.   ......................................  It is also associated with higher death rate at younger ages,   autoimmune diseases like Rheumatoid arthritis, Lupus, Multiple Sclerosis.     Also many other serious conditions, like depression, Alzheimer's  Dementia, infertility, muscle aches, fatigue, fibromyalgia - just to name a few.  +++++++++++++++++++  Recommend the book "The END of DIETING" by Dr Joel Fuhrman   & the book "The END of DIABETES " by Dr Joel Fuhrman  At [employer] - get book & Audio CD's     Being diabetic has a  300% increased risk for heart attack, stroke, cancer, and alzheimer- type vascular dementia. It is very important that you work harder with diet by avoiding all foods that are white. Avoid white rice (brown & wild rice is OK), white potatoes (sweetpotatoes in moderation is OK), White bread or wheat bread or anything made out of white flour like bagels, donuts, rolls, buns, biscuits, cakes, pastries, cookies, pizza crust, and pasta (made from white flour & egg whites) - vegetarian pasta or spinach or wheat pasta is OK. Multigrain breads like Arnold's or [employer], or multigrain sandwich thins or flatbreads.  Diet, exercise and weight loss can reverse and cure diabetes in the early  stages.  Diet, exercise and weight loss is very important in the control and prevention of complications of diabetes which affects every system in your body, ie. Brain - dementia/stroke, eyes - glaucoma/blindness, heart - heart attack/heart failure, kidneys - dialysis, stomach - gastric paralysis, intestines - malabsorption, nerves - severe painful neuritis, circulation - gangrene & loss of a leg(s), and finally cancer and Alzheimers.    I recommend avoid fried & greasy foods,  sweets/candy, white rice (brown or wild rice or Quinoa is OK), white potatoes (sweet potatoes are OK) - anything made from white flour - bagels, doughnuts, rolls, buns, biscuits,white and wheat breads, pizza crust and traditional pasta made of white flour & egg white(vegetarian pasta or spinach or wheat pasta is OK).  Multi-grain bread is OK - like multi-grain flat bread or sandwich thins. Avoid alcohol in excess. Exercise is also important.    Eat all the vegetables you want - avoid meat, especially red meat and dairy - especially cheese.  Cheese is the most concentrated form of trans-fats which is the worst thing to clog up our arteries. Veggie cheese is OK which can be found in the fresh produce section at [employer] or [employer] or [employer]  ++++++++++++++++++++++++++   Preventive Care for Adults  A healthy lifestyle and preventive care can promote health and wellness. Preventive health guidelines for women include the following key practices.  A routine   to check with your health care provider about your health and preventive screening. It is a chance to share any concerns and updates on your health and to receive a thorough exam.  Visit your dentist for a routine exam and preventive care every 6 months. Brush your teeth twice a day and floss once a day. Good oral hygiene prevents tooth decay and gum disease.  The frequency of eye exams is based on your age, health, family medical history, use  of contact lenses, and other factors. Follow your health care provider's recommendations for frequency of eye exams.  Eat a healthy diet. Foods like vegetables, fruits, whole grains, low-fat dairy products, and lean protein foods contain the nutrients you need without too many calories. Decrease your intake of foods high in solid fats, added sugars, and salt. Eat the right amount of calories for you.Get information about a proper diet from your health care provider, if necessary.  Regular physical exercise is one of the most important things you can do for your health. Most adults should get at least 150 minutes of moderate-intensity exercise (any activity that increases your heart rate and causes you to sweat) each week. In addition, most adults need muscle-strengthening exercises on 2 or more days a week.  Maintain a healthy weight. The body mass index (BMI) is a screening tool to identify possible weight problems. It provides an estimate of body fat based on height and weight. Your health care provider can find your BMI and can help you achieve or maintain a healthy weight.For adults 20 years and older:  A BMI below 18.5 is considered underweight.  A BMI of 18.5 to 24.9 is normal.  A BMI of 25 to 29.9 is considered overweight.  A BMI of 30 and above is considered obese.  Maintain normal blood lipids and cholesterol levels by exercising and minimizing your intake of saturated fat. Eat a balanced diet with plenty of fruit and vegetables. If your lipid or cholesterol levels are high, you are over 50, or you are at high risk for heart disease, you may need your cholesterol levels checked more frequently.Ongoing high lipid and cholesterol levels should be treated with medicines if diet and exercise are not working.  If you smoke, find out from your health care provider how to quit. If you do not use tobacco, do not start.  Lung cancer screening is recommended for adults aged 55-80 years who are  at high risk for developing lung cancer because of a history of smoking. A yearly low-dose CT scan of the lungs is recommended for people who have at least a 30-pack-year history of smoking and are a current smoker or have quit within the past 15 years. A pack year of smoking is smoking an average of 1 pack of cigarettes a day for 1 year (for example: 1 pack a day for 30 years or 2 packs a day for 15 years). Yearly screening should continue until the smoker has stopped smoking for at least 15 years. Yearly screening should be stopped for people who develop a health problem that would prevent them from having lung cancer treatment.  Avoid use of street drugs. Do not share needles with anyone. Ask for help if you need support or instructions about stopping the use of drugs.  High blood pressure causes heart disease and increases the risk of stroke.  Ongoing high blood pressure should be treated with medicines if weight loss and exercise do not work.  If you are 55-79   years old, ask your health care provider if you should take aspirin to prevent strokes.  Diabetes screening involves taking a blood sample to check your fasting blood sugar level. This should be done once every 3 years, after age 45, if you are within normal weight and without risk factors for diabetes. Testing should be considered at a younger age or be carried out more frequently if you are overweight and have at least 1 risk factor for diabetes.  Breast cancer screening is essential preventive care for women. You should practice "breast self-awareness." This means understanding the normal appearance and feel of your breasts and may include breast self-examination. Any changes detected, no matter how small, should be reported to a health care provider. Women in their 20s and 30s should have a clinical breast exam (CBE) by a health care provider as part of a regular health exam every 1 to 3 years. After age 40, women should have a CBE every  year. Starting at age 40, women should consider having a mammogram (breast X-ray test) every year. Women who have a family history of breast cancer should talk to their health care provider about genetic screening. Women at a high risk of breast cancer should talk to their health care providers about having an MRI and a mammogram every year.  Breast cancer gene (BRCA)-related cancer risk assessment is recommended for women who have family members with BRCA-related cancers. BRCA-related cancers include breast, ovarian, tubal, and peritoneal cancers. Having family members with these cancers may be associated with an increased risk for harmful changes (mutations) in the breast cancer genes BRCA1 and BRCA2. Results of the assessment will determine the need for genetic counseling and BRCA1 and BRCA2 testing.  Routine pelvic exams to screen for cancer are no longer recommended for nonpregnant women who are considered low risk for cancer of the pelvic organs (ovaries, uterus, and vagina) and who do not have symptoms. Ask your health care provider if a screening pelvic exam is right for you.  If you have had past treatment for cervical cancer or a condition that could lead to cancer, you need Pap tests and screening for cancer for at least 20 years after your treatment. If Pap tests have been discontinued, your risk factors (such as having a new sexual partner) need to be reassessed to determine if screening should be resumed. Some women have medical problems that increase the chance of getting cervical cancer. In these cases, your health care provider may recommend more frequent screening and Pap tests.    Colorectal cancer can be detected and often prevented. Most routine colorectal cancer screening begins at the age of 50 years and continues through age 75 years. However, your health care provider may recommend screening at an earlier age if you have risk factors for colon cancer. On a yearly basis, your health  care provider may provide home test kits to check for hidden blood in the stool. Use of a small camera at the end of a tube, to directly examine the colon (sigmoidoscopy or colonoscopy), can detect the earliest forms of colorectal cancer. Talk to your health care provider about this at age 50, when routine screening begins. Direct exam of the colon should be repeated every 5-10 years through age 75 years, unless early forms of pre-cancerous polyps or small growths are found.  Osteoporosis is a disease in which the bones lose minerals and strength with aging. This can result in serious bone fractures or breaks. The risk of osteoporosis   can be identified using a bone density scan. Women ages 65 years and over and women at risk for fractures or osteoporosis should discuss screening with their health care providers. Ask your health care provider whether you should take a calcium supplement or vitamin D to reduce the rate of osteoporosis.  Menopause can be associated with physical symptoms and risks. Hormone replacement therapy is available to decrease symptoms and risks. You should talk to your health care provider about whether hormone replacement therapy is right for you.  Use sunscreen. Apply sunscreen liberally and repeatedly throughout the day. You should seek shade when your shadow is shorter than you. Protect yourself by wearing long sleeves, pants, a wide-brimmed hat, and sunglasses year round, whenever you are outdoors.  Once a month, do a whole body skin exam, using a mirror to look at the skin on your back. Tell your health care provider of new moles, moles that have irregular borders, moles that are larger than a pencil eraser, or moles that have changed in shape or color.  Stay current with required vaccines (immunizations).  Influenza vaccine. All adults should be immunized every year.  Tetanus, diphtheria, and acellular pertussis (Td, Tdap) vaccine. Pregnant women should receive 1 dose of  Tdap vaccine during each pregnancy. The dose should be obtained regardless of the length of time since the last dose. Immunization is preferred during the 27th-36th week of gestation. An adult who has not previously received Tdap or who does not know her vaccine status should receive 1 dose of Tdap. This initial dose should be followed by tetanus and diphtheria toxoids (Td) booster doses every 10 years. Adults with an unknown or incomplete history of completing a 3-dose immunization series with Td-containing vaccines should begin or complete a primary immunization series including a Tdap dose. Adults should receive a Td booster every 10 years.    Zoster vaccine. One dose is recommended for adults aged 60 years or older unless certain conditions are present.    Pneumococcal 13-valent conjugate (PCV13) vaccine. When indicated, a person who is uncertain of her immunization history and has no record of immunization should receive the PCV13 vaccine. An adult aged 19 years or older who has certain medical conditions and has not been previously immunized should receive 1 dose of PCV13 vaccine. This PCV13 should be followed with a dose of pneumococcal polysaccharide (PPSV23) vaccine. The PPSV23 vaccine dose should be obtained at least 8 weeks after the dose of PCV13 vaccine. An adult aged 19 years or older who has certain medical conditions and previously received 1 or more doses of PPSV23 vaccine should receive 1 dose of PCV13. The PCV13 vaccine dose should be obtained 1 or more years after the last PPSV23 vaccine dose.    Pneumococcal polysaccharide (PPSV23) vaccine. When PCV13 is also indicated, PCV13 should be obtained first. All adults aged 65 years and older should be immunized. An adult younger than age 65 years who has certain medical conditions should be immunized. Any person who resides in a nursing home or long-term care facility should be immunized. An adult smoker should be immunized. People with an  immunocompromised condition and certain other conditions should receive both PCV13 and PPSV23 vaccines. People with human immunodeficiency virus (HIV) infection should be immunized as soon as possible after diagnosis. Immunization during chemotherapy or radiation therapy should be avoided. Routine use of PPSV23 vaccine is not recommended for American Indians, [place] Natives, or people younger than 65 years unless there are medical conditions that require   PPSV23 vaccine. When indicated, people who have unknown immunization and have no record of immunization should receive PPSV23 vaccine. One-time revaccination 5 years after the first dose of PPSV23 is recommended for people aged 19-64 years who have chronic kidney failure, nephrotic syndrome, asplenia, or immunocompromised conditions. People who received 1-2 doses of PPSV23 before age 65 years should receive another dose of PPSV23 vaccine at age 65 years or later if at least 5 years have passed since the previous dose. Doses of PPSV23 are not needed for people immunized with PPSV23 at or after age 65 years.   Preventive Services / Frequency  Ages 65 years and over  Blood pressure check.  Lipid and cholesterol check.  Lung cancer screening. / Every year if you are aged 55-80 years and have a 30-pack-year history of smoking and currently smoke or have quit within the past 15 years. Yearly screening is stopped once you have quit smoking for at least 15 years or develop a health problem that would prevent you from having lung cancer treatment.  Clinical breast exam.** / Every year after age 40 years.  BRCA-related cancer risk assessment.** / For women who have family members with a BRCA-related cancer (breast, ovarian, tubal, or peritoneal cancers).  Mammogram.** / Every year beginning at age 40 years and continuing for as long as you are in good health. Consult with your health care provider.  Pap test.** / Every 3 years starting at age 30 years  through age 65 or 70 years with 3 consecutive normal Pap tests. Testing can be stopped between 65 and 70 years with 3 consecutive normal Pap tests and no abnormal Pap or HPV tests in the past 10 years.  Fecal occult blood test (FOBT) of stool. / Every year beginning at age 50 years and continuing until age 75 years. You may not need to do this test if you get a colonoscopy every 10 years.  Flexible sigmoidoscopy or colonoscopy.** / Every 5 years for a flexible sigmoidoscopy or every 10 years for a colonoscopy beginning at age 50 years and continuing until age 75 years.  Hepatitis C blood test.** / For all people born from 1945 through 1965 and any individual with known risks for hepatitis C.  Osteoporosis screening.** / A one-time screening for women ages 65 years and over and women at risk for fractures or osteoporosis.  Skin self-exam. / Monthly.  Influenza vaccine. / Every year.  Tetanus, diphtheria, and acellular pertussis (Tdap/Td) vaccine.** / 1 dose of Td every 10 years.  Zoster vaccine.** / 1 dose for adults aged 60 years or older.  Pneumococcal 13-valent conjugate (PCV13) vaccine.** / Consult your health care provider.  Pneumococcal polysaccharide (PPSV23) vaccine.** / 1 dose for all adults aged 65 years and older. Screening for abdominal aortic aneurysm (AAA)  by ultrasound is recommended for people who have history of high blood pressure or who are current or former smokers. 

## 2014-10-03 NOTE — Progress Notes (Signed)
Patient ID: [name] [name], female   DOB: [birth year], 81 y.o.   MRN: [id number]   Comprehensive Evaluation & Examination     This very nice 81 y.o. MWF presents for presents for a comprehensive evaluation, examination and management of multiple medical co-morbidities.  Patient has been followed for HTN, T2_NIDDM, Hyperlipidemia and Vitamin D Deficiency.     Patient's HTN predates since 43. Patient's BP has been controlled at home and patient denies any cardiac symptoms as chest pain, palpitations, shortness of breath, dizziness or ankle swelling. Patient had a negative Cardiolite in 2013.  Today's BP: 132/68 mmHg      Patient's hyperlipidemia is controlled with diet and medications. Patient denies myalgias or other medication SE's. Last lipids were not at goal - Cholesterol 197; HDL 54; LDL 122*; Triglycerides 103 on 05/25/2014.     Patient has T2_NIDDM w/ Stage 2 CKD  predating since 1990 and patient denies reactive hypoglycemic symptoms, visual blurring, diabetic polys, or paresthesias. Last A1c 7.7% on 05/25/2014  was not at goal.     Patient has long-standing hx/o GERD which is controlled with meds and diet. Finally, patient has history of Vitamin D Deficiency of 27 in 2008 and last Vitamin D was 53 on 05/25/2014.     Medication Sig  . atenolol  100 MG tablet Take 100 mg by mouth daily.  Marland Kitchen atorvastatin  80 MG tablet Take 1 tablet (80 mg total) by mouth daily.  . metFORMIN -XR 500 MG  Take 2 tablets (1,000 mg total) by mouth 2 (two) times daily.  . promethazine  12.5 MG tablet Take 1 tablet (12.5 mg total) by mouth every 8 (eight) hours as needed for nausea or vomiting.  . ranitidine  150 MG tablet Take 1 tablet (150 mg total) by mouth 2 (two) times daily.  . verapamil -SR 240 MG CR tablet Take 1 tablet (240 mg total) by mouth at bedtime.   Allergies  Allergen Reactions  . Actos [Pioglitazone]   . Bee Venom     Eyes swell, cannot breathe  . Persantine [Dipyridamole]     "went crazy"    Past Medical History  Diagnosis Date  . Diverticulosis of colon (without mention of hemorrhage) 2009  . Colon polyp 2009     TUBULAR ADENOMA  . Hiatal hernia 2009  . Gastritis 2009  . GERD (gastroesophageal reflux disease) 2009  . Esophagitis 2009  . Diabetes   . Hyperlipemia   . Cataract   . Thyroid disease     hypothyroid  . Hypertension   . Anemia    Health Maintenance  Topic Date Due  . OPHTHALMOLOGY EXAM  10/02/1943  . ZOSTAVAX  10/01/1993  . URINE MICROALBUMIN  07/28/2014  . INFLUENZA VACCINE  08/08/2014  . FOOT EXAM  11/05/2014  . HEMOGLOBIN A1C  11/25/2014  . TETANUS/TDAP  02/02/2016  . DEXA SCAN  Completed  . PNA vac Low Risk Adult  Completed   Immunization History  Administered Date(s) Administered  . Influenza Split 10/03/2014  . Influenza-Unspecified 11/18/2012  . Pneumococcal Conjugate-13 11/04/2013  . Pneumococcal Polysaccharide-23 07/25/2010  . Pneumococcal-Unspecified 02/01/2001  . Td 02/01/2006   Past Surgical History  Procedure Laterality Date  . Dilation and curettage of uterus  1984  . Breast biopsy Left 1999    neg  . Colonoscopy  05/2012    Dr. Sharlett Iles   Family History  Problem Relation Age of Onset  . Breast cancer Maternal Aunt   . Throat cancer Maternal  Uncle   . Lung cancer Brother   . Prostate cancer Brother   . Diabetes Father   . Rectal cancer Neg Hx   . Stomach cancer Neg Hx   . Colon cancer Neg Hx   . Esophageal cancer Neg Hx   . Stroke Mother    Social History  Substance Use Topics  . Smoking status: Never Smoker   . Smokeless tobacco: Current User    Types: Snuff  . Alcohol Use: No    ROS Constitutional: Denies fever, chills, weight loss/gain, headaches, insomnia,  night sweats, and change in appetite. Does c/o fatigue. Eyes: Denies redness, blurred vision, diplopia, discharge, itchy, watery eyes.  ENT: Denies discharge, congestion, post nasal drip, epistaxis, sore throat, earache, hearing loss, dental pain,  Tinnitus, Vertigo, Sinus pain, snoring.  Cardio: Denies chest pain, palpitations, irregular heartbeat, syncope, dyspnea, diaphoresis, orthopnea, PND, claudication, edema Respiratory: denies cough, dyspnea, DOE, pleurisy, hoarseness, laryngitis, wheezing.  Gastrointestinal: Denies dysphagia, heartburn, reflux, water brash, pain, cramps, nausea, vomiting, bloating, diarrhea, constipation, hematemesis, melena, hematochezia, jaundice, hemorrhoids Genitourinary: Denies dysuria, frequency, urgency, nocturia, hesitancy, discharge, hematuria, flank pain Breast: Breast lumps, nipple discharge, bleeding.  Musculoskeletal: Denies arthralgia, myalgia, stiffness, Jt. Swelling, pain, limp, and strain/sprain. Denies falls. Skin: Denies puritis, rash, hives, warts, acne, eczema, changing in skin lesion Neuro: No weakness, tremor, incoordination, spasms, paresthesia, pain Psychiatric: Denies confusion, memory loss, sensory loss. Denies Depression. Endocrine: Denies change in weight, skin, hair change, nocturia, and paresthesia, diabetic polys, visual blurring, hyper / hypo glycemic episodes.  Heme/Lymph: No excessive bleeding, bruising, enlarged lymph nodes.  Physical Exam  BP 132/68 mmHg  Pulse 68  Temp(Src) 97.4 F (36.3 C)  Resp 16  Ht 5\' 1"  (1.549 m)  Wt 129 lb 12.8 oz (58.877 kg)  BMI 24.54 kg/m2  General Appearance: Over nourished with central obesity and in no apparent distress. Eyes: PERRLA, EOMs, conjunctiva no swelling or erythema, normal fundi and vessels. Sinuses: No frontal/maxillary tenderness ENT/Mouth: EACs patent / TMs  nl. Nares clear without erythema, swelling, mucoid exudates. Oral hygiene is good. No erythema, swelling, or exudate. Tongue normal, non-obstructing. Tonsils not swollen or erythematous. Hearing normal.  Neck: Supple, thyroid normal. No bruits, nodes or JVD. Respiratory: Respiratory effort normal.  BS equal and clear bilateral without rales, rhonci, wheezing or  stridor. Cardio: Heart sounds are normal with regular rate and rhythm and no murmurs, rubs or gallops. Peripheral pulses are normal and equal bilaterally without edema. No aortic or femoral bruits. Chest: symmetric with normal excursions and percussion. Breasts: Symmetric, without lumps, nipple discharge, retractions, or fibrocystic changes.  Abdomen: Flat, soft, with bowel sounds. Nontender, no guarding, rebound, hernias, masses, or organomegaly.  Lymphatics: Non tender without lymphadenopathy.  Genitourinary:  Musculoskeletal: Full ROM all peripheral extremities, joint stability, 5/5 strength, and normal gait. Skin: Warm and dry without rashes, lesions, cyanosis, clubbing or  ecchymosis.  Neuro: Cranial nerves intact, reflexes equal bilaterally. Normal muscle tone, no cerebellar symptoms. Sensation intact to touch & Monofilament testing to the toes bilaterally.  Pysch: Awake and oriented X 3, normal affect, Insight and Judgment appropriate.   Assessment and Plan  1. Essential hypertension  - Microalbumin / creatinine urine ratio - EKG 12-Lead - [place], RETROPERITNL ABD,  LTD - TSH  2. Hyperlipidemia   3. 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 Vitamin D deficiency  - Vit D  25 hydroxy (r  5. Hypothyroidism   6. Gastroesophageal reflux disease   7. Obesity   8. Screening for rectal cancer  - POC Hemoccult Bld/Stl   9. Depression screen   10. At low risk for fall   11. Medication management  - CBC with Differential/Platelet - BASIC METABOLIC PANEL WITH GFR - Hepatic function panel - Magnesium - Lipid panel - Urinalysis, Routine w reflex microscopic   12. Need for prophylactic vaccination and inoculation against influenza  - Flu vaccine > 3yo with preservative IM (Fluvirin Influenza Split)  13. Body mass index (BMI) of 24.0-24.9 in adult   Continue  prudent diet as discussed, weight control, BP monitoring, regular exercise, and medications. Discussed med's effects and SE's. Screening labs and tests as requested with regular follow-up as recommended.  Over 40 minutes of exam, counseling, chart review was performed.

## 2014-10-04 LAB — HEMOGLOBIN A1C
Hgb A1c MFr Bld: 7.4 % — ABNORMAL HIGH (ref <5.7)
Mean Plasma Glucose: 166 mg/dL — ABNORMAL HIGH (ref <117)

## 2014-10-04 LAB — MICROALBUMIN / CREATININE URINE RATIO
Creatinine, Urine: 37.5 mg/dL
Microalb Creat Ratio: 13.3 mg/g (ref 0.0–30.0)
Microalb, Ur: 0.5 mg/dL (ref <2.0)

## 2014-10-04 LAB — URINALYSIS, ROUTINE W REFLEX MICROSCOPIC
Bilirubin Urine: NEGATIVE
Glucose, UA: NEGATIVE
Hgb urine dipstick: NEGATIVE
Ketones, ur: NEGATIVE
Leukocytes, UA: NEGATIVE
Nitrite: NEGATIVE
Protein, ur: NEGATIVE
Specific Gravity, Urine: 1.006 (ref 1.001–1.035)
pH: 5.5 (ref 5.0–8.0)

## 2014-10-04 LAB — TSH: TSH: 1.739 u[IU]/mL (ref 0.350–4.500)

## 2014-10-04 LAB — VITAMIN D 25 HYDROXY (VIT D DEFICIENCY, FRACTURES): Vit D, 25-Hydroxy: 38 ng/mL (ref 30–100)

## 2014-10-04 LAB — INSULIN, RANDOM: Insulin: 5.3 u[IU]/mL (ref 2.0–19.6)

## 2014-11-27 ENCOUNTER — Other Ambulatory Visit: Payer: Self-pay | Admitting: Internal Medicine

## 2014-12-05 ENCOUNTER — Other Ambulatory Visit: Payer: Self-pay | Admitting: Internal Medicine

## 2014-12-05 ENCOUNTER — Other Ambulatory Visit: Payer: Self-pay

## 2014-12-13 ENCOUNTER — Telehealth: Payer: Self-pay | Admitting: *Deleted

## 2014-12-13 NOTE — Telephone Encounter (Signed)
Patient called and asked for a refill on Sorbitol.  Per Dr Melford Aase, patient should try OTC generic Miralax since it is safer than Soritol.  Patient advised of his suggestion.

## 2015-01-05 ENCOUNTER — Ambulatory Visit (INDEPENDENT_AMBULATORY_CARE_PROVIDER_SITE_OTHER): Payer: Medicare Other | Admitting: Internal Medicine

## 2015-01-05 ENCOUNTER — Encounter: Payer: Self-pay | Admitting: Internal Medicine

## 2015-01-05 VITALS — BP 138/72 | HR 90 | Temp 98.2°F | Resp 16 | Ht 61.0 in | Wt 135.0 lb

## 2015-01-05 DIAGNOSIS — Z79899 Other long term (current) drug therapy: Secondary | ICD-10-CM | POA: Diagnosis not present

## 2015-01-05 DIAGNOSIS — E559 Vitamin D deficiency, unspecified: Secondary | ICD-10-CM

## 2015-01-05 DIAGNOSIS — E782 Mixed hyperlipidemia: Secondary | ICD-10-CM

## 2015-01-05 DIAGNOSIS — E1122 Type 2 diabetes mellitus with diabetic chronic kidney disease: Secondary | ICD-10-CM

## 2015-01-05 DIAGNOSIS — N182 Chronic kidney disease, stage 2 (mild): Secondary | ICD-10-CM | POA: Diagnosis not present

## 2015-01-05 DIAGNOSIS — E039 Hypothyroidism, unspecified: Secondary | ICD-10-CM

## 2015-01-05 DIAGNOSIS — I10 Essential (primary) hypertension: Secondary | ICD-10-CM

## 2015-01-05 LAB — LIPID PANEL
Cholesterol: 124 mg/dL — ABNORMAL LOW (ref 125–200)
HDL: 49 mg/dL (ref >=46)
LDL Cholesterol: 56 mg/dL (ref <130)
Total CHOL/HDL Ratio: 2.5 Ratio (ref <=5.0)
Triglycerides: 96 mg/dL (ref <150)
VLDL: 19 mg/dL (ref <30)

## 2015-01-05 LAB — CBC WITH DIFFERENTIAL/PLATELET
Basophils Absolute: 0 10*3/uL (ref 0.0–0.1)
Basophils Relative: 0 % (ref 0–1)
Eosinophils Absolute: 0 10*3/uL (ref 0.0–0.7)
Eosinophils Relative: 0 % (ref 0–5)
HCT: 34.8 % — ABNORMAL LOW (ref 36.0–46.0)
Hemoglobin: 12 g/dL (ref 12.0–15.0)
Lymphocytes Relative: 47 % — ABNORMAL HIGH (ref 12–46)
Lymphs Abs: 2.3 10*3/uL (ref 0.7–4.0)
MCH: 29.6 pg (ref 26.0–34.0)
MCHC: 34.5 g/dL (ref 30.0–36.0)
MCV: 85.7 fL (ref 78.0–100.0)
MPV: 9.5 fL (ref 8.6–12.4)
Monocytes Absolute: 0.8 10*3/uL (ref 0.1–1.0)
Monocytes Relative: 16 % — ABNORMAL HIGH (ref 3–12)
Neutro Abs: 1.8 10*3/uL (ref 1.7–7.7)
Neutrophils Relative %: 37 % — ABNORMAL LOW (ref 43–77)
Platelets: 221 10*3/uL (ref 150–400)
RBC: 4.06 MIL/uL (ref 3.87–5.11)
RDW: 14.2 % (ref 11.5–15.5)
WBC: 4.9 10*3/uL (ref 4.0–10.5)

## 2015-01-05 LAB — HEPATIC FUNCTION PANEL
ALT: 11 U/L (ref 6–29)
AST: 15 U/L (ref 10–35)
Albumin: 4.2 g/dL (ref 3.6–5.1)
Alkaline Phosphatase: 43 U/L (ref 33–130)
Bilirubin, Direct: 0.1 mg/dL (ref <=0.2)
Indirect Bilirubin: 0.3 mg/dL (ref 0.2–1.2)
Total Bilirubin: 0.4 mg/dL (ref 0.2–1.2)
Total Protein: 7.6 g/dL (ref 6.1–8.1)

## 2015-01-05 LAB — BASIC METABOLIC PANEL WITH GFR
BUN: 9 mg/dL (ref 7–25)
CO2: 22 mmol/L (ref 20–31)
Calcium: 9.3 mg/dL (ref 8.6–10.4)
Chloride: 104 mmol/L (ref 98–110)
Creat: 0.64 mg/dL (ref 0.60–0.88)
GFR, Est African American: >89 mL/min (ref >=60)
GFR, Est Non African American: 84 mL/min (ref >=60)
Glucose, Bld: 170 mg/dL — ABNORMAL HIGH (ref 65–99)
Potassium: 3.8 mmol/L (ref 3.5–5.3)
Sodium: 139 mmol/L (ref 135–146)

## 2015-01-05 LAB — TSH: TSH: 0.57 u[IU]/mL (ref 0.350–4.500)

## 2015-01-05 MED ORDER — LUBIPROSTONE 24 MCG PO CAPS: 24.0000 ug | ORAL_CAPSULE | Freq: Every day | ORAL | Status: DC

## 2015-01-05 NOTE — Progress Notes (Signed)
Patient ID: [name] [name], female   DOB: 1931-08-30, 81 y.o.   MRN: [id number]  Assessment and Plan:  Hypertension:  -Continue medication,  -monitor blood pressure at home.  -Continue DASH diet.   -Reminder to go to the ER if any CP, SOB, nausea, dizziness, severe HA, changes vision/speech, left arm numbness and tingling, and jaw pain.  Cholesterol: -Continue diet and exercise.  -Check cholesterol.   Pre-diabetes: -Continue diet and exercise.  -Check A1C  Vitamin D Def: -check level -continue medications.   Constipation -amitiza samples given -recommend increasing water -recommend increasing fiber  Continue diet and meds as discussed. Further disposition pending results of labs.  HPI 81 y.o. female  presents for 3 month follow up with hypertension, hyperlipidemia, prediabetes and vitamin D.   Her blood pressure has been controlled at home, today their BP is BP: 138/72 mmHg.   She does not workout. She denies chest pain, shortness of breath, dizziness.   She is on cholesterol medication and denies myalgias. Her cholesterol is at goal. The cholesterol last visit was:   Lab Results  Component Value Date   CHOL 213* 10/03/2014   HDL 59 10/03/2014   LDLCALC 121 10/03/2014   TRIG 167* 10/03/2014   CHOLHDL 3.6 10/03/2014     She has not been working on diet and exercise for prediabetes, and denies foot ulcerations, hyperglycemia, hypoglycemia , increased appetite, nausea, paresthesia of the feet, polydipsia, polyuria, visual disturbances, vomiting and weight loss. Last A1C in the office was:  Lab Results  Component Value Date   HGBA1C 7.4* 10/03/2014  She reports that she doesn't check her blood sugar at home.  She reports that she forgets what her last reading was.    Patient is on Vitamin D supplement.  Lab Results  Component Value Date   VD25OH 65 10/03/2014     She has been constipated and had only relief with enema.  She did try miralax with no relief.    Current  Medications:  Current Outpatient Prescriptions on File Prior to Visit  Medication Sig Dispense Refill  . atenolol (TENORMIN) 100 MG tablet Take 100 mg by mouth daily.    Marland Kitchen atorvastatin (LIPITOR) 80 MG tablet Take 1 tablet (80 mg total) by mouth daily. 90 tablet 3  . metFORMIN (GLUCOPHAGE-XR) 500 MG 24 hr tablet TAKE 2 TABLETS TWICE A DAY 360 tablet 1  . verapamil (CALAN-SR) 240 MG CR tablet Take 1 tablet (240 mg total) by mouth at bedtime. 90 tablet 1   No current facility-administered medications on file prior to visit.    Medical History:  Past Medical History  Diagnosis Date  . Diverticulosis of colon (without mention of hemorrhage) 2009  . Colon polyp 2009     TUBULAR ADENOMA  . Hiatal hernia 2009  . Gastritis 2009  . GERD (gastroesophageal reflux disease) 2009  . Esophagitis 2009  . Diabetes ([place])   . Hyperlipemia   . Cataract   . Thyroid disease     hypothyroid  . Hypertension   . Anemia     Allergies:  Allergies  Allergen Reactions  . Actos [Pioglitazone]   . Bee Venom     Eyes swell, cannot breathe  . Persantine [Dipyridamole]     "went crazy"     Review of Systems:  Review of Systems  Constitutional: Negative for fever, chills and malaise/fatigue.  HENT: Negative for congestion, ear pain and sore throat.   Respiratory: Negative for cough, shortness of breath and wheezing.  Cardiovascular: Negative for chest pain, palpitations and leg swelling.  Gastrointestinal: Positive for constipation. Negative for heartburn, abdominal pain, diarrhea, blood in stool and melena.  Genitourinary: Negative.   Skin: Negative.   Neurological: Negative for dizziness, sensory change, loss of consciousness and headaches.  Psychiatric/Behavioral: Positive for memory loss. Negative for depression. The patient is not nervous/anxious and does not have insomnia.     Family history- Review and unchanged  Social history- Review and unchanged  Physical Exam: BP 138/72 mmHg   Pulse 90  Temp(Src) 98.2 F (36.8 C) (Temporal)  Resp 16  Ht 5\' 1"  (1.549 m)  Wt 135 lb (61.236 kg)  BMI 25.52 kg/m2 Wt Readings from Last 3 Encounters:  01/05/15 135 lb (61.236 kg)  10/03/14 129 lb 12.8 oz (58.877 kg)  05/25/14 141 lb (63.957 kg)    General Appearance: Well nourished well developed, in no apparent distress. Eyes: PERRLA, EOMs, conjunctiva no swelling or erythema ENT/Mouth: Ear canals normal without obstruction, swelling, erythma, discharge.  TMs normal bilaterally.  Oropharynx moist, clear, without exudate, or postoropharyngeal swelling. Neck: Supple, thyroid normal,no cervical adenopathy  Respiratory: Respiratory effort normal, Breath sounds clear A&P without rhonchi, wheeze, or rale.  No retractions, no accessory usage. Cardio: RRR with no MRGs. Brisk peripheral pulses without edema.  Abdomen: Soft, + BS,  Non tender, no guarding, rebound, hernias, masses. Musculoskeletal: Full ROM, 5/5 strength, Normal gait Skin: Warm, dry without rashes, lesions, ecchymosis.  Neuro: Awake and oriented X 3, Cranial nerves intact. Normal muscle tone, no cerebellar symptoms. Psych: Normal affect, Insight and Judgment appropriate.    Starlyn Skeans, PA-C 10:57 AM [place] [facility]

## 2015-01-05 NOTE — Patient Instructions (Addendum)
Please take 1 tablet daily of the amitiza to help keep you from getting constipated.  If you have diarrhea or you have any side effects from the medication stop taking it and call the office.

## 2015-01-06 LAB — HEMOGLOBIN A1C
Hgb A1c MFr Bld: 8.1 % — ABNORMAL HIGH (ref <5.7)
Mean Plasma Glucose: 186 mg/dL — ABNORMAL HIGH (ref <117)

## 2015-03-08 ENCOUNTER — Other Ambulatory Visit: Payer: Self-pay | Admitting: Internal Medicine

## 2015-04-06 ENCOUNTER — Ambulatory Visit: Payer: Self-pay | Admitting: Internal Medicine

## 2015-04-08 ENCOUNTER — Other Ambulatory Visit: Payer: Self-pay | Admitting: Internal Medicine

## 2015-04-08 DIAGNOSIS — I10 Essential (primary) hypertension: Secondary | ICD-10-CM

## 2015-05-24 ENCOUNTER — Other Ambulatory Visit: Payer: Self-pay | Admitting: *Deleted

## 2015-05-24 MED ORDER — METFORMIN HCL ER 500 MG PO TB24: 1000.0000 mg | ORAL_TABLET | Freq: Two times a day (BID) | ORAL | Status: DC

## 2015-06-04 ENCOUNTER — Other Ambulatory Visit: Payer: Self-pay | Admitting: Internal Medicine

## 2015-06-04 DIAGNOSIS — E785 Hyperlipidemia, unspecified: Secondary | ICD-10-CM

## 2015-06-19 ENCOUNTER — Ambulatory Visit (INDEPENDENT_AMBULATORY_CARE_PROVIDER_SITE_OTHER): Payer: Medicare Other | Admitting: Internal Medicine

## 2015-06-19 ENCOUNTER — Encounter: Payer: Self-pay | Admitting: Internal Medicine

## 2015-06-19 VITALS — BP 132/76 | HR 92 | Temp 97.8°F | Resp 16 | Ht 61.0 in | Wt 138.6 lb

## 2015-06-19 DIAGNOSIS — E039 Hypothyroidism, unspecified: Secondary | ICD-10-CM

## 2015-06-19 DIAGNOSIS — N182 Chronic kidney disease, stage 2 (mild): Secondary | ICD-10-CM

## 2015-06-19 DIAGNOSIS — Z79899 Other long term (current) drug therapy: Secondary | ICD-10-CM | POA: Diagnosis not present

## 2015-06-19 DIAGNOSIS — E559 Vitamin D deficiency, unspecified: Secondary | ICD-10-CM | POA: Diagnosis not present

## 2015-06-19 DIAGNOSIS — E782 Mixed hyperlipidemia: Secondary | ICD-10-CM

## 2015-06-19 DIAGNOSIS — E1122 Type 2 diabetes mellitus with diabetic chronic kidney disease: Secondary | ICD-10-CM

## 2015-06-19 DIAGNOSIS — K5900 Constipation, unspecified: Secondary | ICD-10-CM

## 2015-06-19 DIAGNOSIS — I10 Essential (primary) hypertension: Secondary | ICD-10-CM

## 2015-06-19 DIAGNOSIS — K219 Gastro-esophageal reflux disease without esophagitis: Secondary | ICD-10-CM

## 2015-06-19 LAB — CBC WITH DIFFERENTIAL/PLATELET
Basophils Absolute: 0 cells/uL (ref 0–200)
Basophils Relative: 0 %
Eosinophils Absolute: 0 cells/uL — ABNORMAL LOW (ref 15–500)
Eosinophils Relative: 0 %
HCT: 38.7 % (ref 35.0–45.0)
Hemoglobin: 12.7 g/dL (ref 11.7–15.5)
Lymphocytes Relative: 44 %
Lymphs Abs: 2068 cells/uL (ref 850–3900)
MCH: 29.1 pg (ref 27.0–33.0)
MCHC: 32.8 g/dL (ref 32.0–36.0)
MCV: 88.8 fL (ref 80.0–100.0)
MPV: 9.8 fL (ref 7.5–12.5)
Monocytes Absolute: 564 cells/uL (ref 200–950)
Monocytes Relative: 12 %
Neutro Abs: 2068 cells/uL (ref 1500–7800)
Neutrophils Relative %: 44 %
Platelets: 212 10*3/uL (ref 140–400)
RBC: 4.36 MIL/uL (ref 3.80–5.10)
RDW: 14 % (ref 11.0–15.0)
WBC: 4.7 10*3/uL (ref 3.8–10.8)

## 2015-06-19 MED ORDER — SORBITOL 70 % PO SOLN: ORAL | Status: DC

## 2015-06-19 NOTE — Patient Instructions (Signed)

## 2015-06-19 NOTE — Progress Notes (Signed)
Patient ID: [name] [name], female   DOB: 05-05-1931, 82 y.o.   MRN: [id number]  [facility]                       [name], M.D.        Uvaldo Bristle. Silverio Lay, P.A.-C       Starlyn Skeans, P.A.-C   [facility]                [address] [place] [id number] Telephone [phone] Telefax [phone] _________________________________________________________________________     This very nice 82 y.o. MWF presents for 3 month follow up with Hypertension, Hyperlipidemia, Pre-Diabetes and Vitamin D Deficiency.      Patient is treated for HTN & BP has been controlled at home. Today's BP: 132/76 mmHg. Patient has had no complaints of any cardiac type chest pain, palpitations, dyspnea/orthopnea/PND, dizziness, claudication, or dependent edema.     Hyperlipidemia is controlled with diet & meds. Patient denies myalgias or other med SE's. Last Lipids were at goal with Cholesterol 124*; HDL 49; LDL 56; Triglycerides 96 on 01/05/2015.     Also, the patient has history of T2_NIDDM since 1996  and has had no symptoms of reactive hypoglycemia, diabetic polys, paresthesias or visual blurring.  Last A1c was not at goal with A1c 8.1% on 01/05/2015.     Further, the patient also has history of Vitamin D Deficiency of "27" in 2008 and supplements vitamin D without any suspected side-effects. Last vitamin D was still very low at 38 on 10/03/2014.    Medication Sig  . atenolol ( 100 MG  Take 100 mg by mouth daily.  Marland Kitchen atorvastatin  80 MG  TAKE 1 TABLET DAILY FOR CHOLESTEROL  . metFORMIN-XR 500 MG Take 2 tablets (1,000 mg total) by mouth 2 (two) times daily.  . verapamil-SR 240 MG  TAKE 1 TABLET AT BEDTIME  . AMITIZA 24 MCG  Take 1 capsule (24 mcg total) by mouth daily with breakfast.   Allergies  Allergen Reactions  . Actos [Pioglitazone]   . Bee Venom     Eyes swell, cannot breathe  . Persantine [Dipyridamole]      "went crazy"   PMHx:   Past Medical History  Diagnosis Date  . Diverticulosis of colon (without mention of hemorrhage) 2009  . Colon polyp 2009     TUBULAR ADENOMA  . Hiatal hernia 2009  . Gastritis 2009  . GERD (gastroesophageal reflux disease) 2009  . Esophagitis 2009  . Diabetes ([place])   . Hyperlipemia   . Cataract   . Thyroid disease     hypothyroid  . Hypertension   . Anemia    Immunization History  Administered Date(s) Administered  . Influenza Split 10/03/2014  . Influenza-Unspecified 11/18/2012  . Pneumococcal Conjugate-13 11/04/2013  . Pneumococcal Polysaccharide-23 07/25/2010  . Pneumococcal-Unspecified 02/01/2001  . Td 02/01/2006   Past Surgical History  Procedure Laterality Date  . Dilation and curettage of uterus  1984  . Breast biopsy Left 1999    neg  . Colonoscopy  05/2012    Dr. Sharlett Iles   FHx:    Reviewed / unchanged  SHx:    Reviewed / unchanged  Systems Review:  Constitutional: Denies fever, chills, wt changes, headaches, insomnia, fatigue, night sweats, change in appetite. Eyes: Denies redness, blurred vision, diplopia, discharge, itchy, watery  eyes.  ENT: Denies discharge, congestion, post nasal drip, epistaxis, sore throat, earache, hearing loss, dental pain, tinnitus, vertigo, sinus pain, snoring.  CV: Denies chest pain, palpitations, irregular heartbeat, syncope, dyspnea, diaphoresis, orthopnea, PND, claudication or edema. Respiratory: denies cough, dyspnea, DOE, pleurisy, hoarseness, laryngitis, wheezing.  Gastrointestinal: Denies dysphagia, odynophagia, heartburn, reflux, water brash, abdominal pain or cramps, nausea, vomiting, bloating, diarrhea, constipation, hematemesis, melena, hematochezia  or hemorrhoids. Genitourinary: Denies dysuria, frequency, urgency, nocturia, hesitancy, discharge, hematuria or flank pain. Musculoskeletal: Denies arthralgias, myalgias, stiffness, jt. swelling, pain, limping or strain/sprain.  Skin: Denies  pruritus, rash, hives, warts, acne, eczema or change in skin lesion(s). Neuro: No weakness, tremor, incoordination, spasms, paresthesia or pain. Psychiatric: Denies confusion, memory loss or sensory loss. Endo: Denies change in weight, skin or hair change.  Heme/Lymph: No excessive bleeding, bruising or enlarged lymph nodes.  Physical Exam  BP 132/76 mmHg  Pulse 92  Temp(Src) 97.8 F (36.6 C)  Resp 16  Ht 5\' 1"  (1.549 m)  Wt 138 lb 9.6 oz (62.869 kg)  BMI 26.20 kg/m2  Appears well nourished and in no distress. Eyes: PERRLA, EOMs, conjunctiva no swelling or erythema. Sinuses: No frontal/maxillary tenderness ENT/Mouth: EAC's clear, TM's nl w/o erythema, bulging. Nares clear w/o erythema, swelling, exudates. Oropharynx clear without erythema or exudates. Oral hygiene is good. Tongue normal, non obstructing. Hearing intact.  Neck: Supple. Thyroid nl. Car 2+/2+ without bruits, nodes or JVD. Chest: Respirations nl with BS clear & equal w/o rales, rhonchi, wheezing or stridor.  Cor: Heart sounds normal w/ regular rate and rhythm without sig. murmurs, gallops, clicks, or rubs. Peripheral pulses normal and equal  without edema.  Abdomen: Soft & bowel sounds normal. Non-tender w/o guarding, rebound, hernias, masses, or organomegaly.  Lymphatics: Unremarkable.  Musculoskeletal: Full ROM all peripheral extremities, joint stability, 5/5 strength, and normal gait.  Skin: Warm, dry without exposed rashes, lesions or ecchymosis apparent.  Neuro: Cranial nerves intact, reflexes equal bilaterally. Sensory-motor testing grossly intact. Tendon reflexes grossly intact.  Pysch: Alert & oriented x 3.  Insight and judgement nl & appropriate. No ideations.  Assessment and Plan:  1. Essential hypertension  - TSH  2. Hyperlipidemia  - Lipid panel - TSH  3. CKD stage 2 due to type 2 diabetes mellitus (HCC)  - Hemoglobin A1c - Insulin, random  4. Vitamin D deficiency  - VITAMIN D 25 Hydroxy    5. Hypothyroidism   6. Gastroesophageal reflux disease   7. Medication management  - CBC with Differential/Platelet - BASIC METABOLIC PANEL WITH GFR - Hepatic function panel - Magnesium  8. Constipation, unspecified constipation type  - sorbitol 70 % solution; Take 2 to 4 Tablespoons daily if needed for constipation  Dispense: 473 mL; Refill: 3   Recommended regular exercise, BP monitoring, weight control, and discussed med and SE's. Recommended labs to assess and monitor clinical status. Further disposition pending results of labs. Over 30 minutes of exam, counseling, chart review was performed

## 2015-06-20 LAB — BASIC METABOLIC PANEL WITH GFR
BUN: 8 mg/dL (ref 7–25)
CO2: 23 mmol/L (ref 20–31)
Calcium: 9.6 mg/dL (ref 8.6–10.4)
Chloride: 102 mmol/L (ref 98–110)
Creat: 0.74 mg/dL (ref 0.60–0.88)
GFR, Est African American: 88 mL/min (ref >=60)
GFR, Est Non African American: 76 mL/min (ref >=60)
Glucose, Bld: 220 mg/dL — ABNORMAL HIGH (ref 65–99)
Potassium: 5.1 mmol/L (ref 3.5–5.3)
Sodium: 139 mmol/L (ref 135–146)

## 2015-06-20 LAB — HEPATIC FUNCTION PANEL
ALT: 7 U/L (ref 6–29)
AST: 9 U/L — ABNORMAL LOW (ref 10–35)
Albumin: 4.6 g/dL (ref 3.6–5.1)
Alkaline Phosphatase: 45 U/L (ref 33–130)
Bilirubin, Direct: 0.1 mg/dL (ref <=0.2)
Indirect Bilirubin: 0.4 mg/dL (ref 0.2–1.2)
Total Bilirubin: 0.5 mg/dL (ref 0.2–1.2)
Total Protein: 7.8 g/dL (ref 6.1–8.1)

## 2015-06-20 LAB — MAGNESIUM: Magnesium: 1.9 mg/dL (ref 1.5–2.5)

## 2015-06-20 LAB — HEMOGLOBIN A1C
Hgb A1c MFr Bld: 9 % — ABNORMAL HIGH (ref <5.7)
Mean Plasma Glucose: 212 mg/dL

## 2015-06-20 LAB — LIPID PANEL
Cholesterol: 191 mg/dL (ref 125–200)
HDL: 58 mg/dL (ref >=46)
LDL Cholesterol: 110 mg/dL (ref <130)
Total CHOL/HDL Ratio: 3.3 Ratio (ref <=5.0)
Triglycerides: 117 mg/dL (ref <150)
VLDL: 23 mg/dL (ref <30)

## 2015-06-20 LAB — TSH: TSH: 0.44 mIU/L

## 2015-06-20 LAB — INSULIN, RANDOM: Insulin: 9.2 u[IU]/mL (ref 2.0–19.6)

## 2015-06-20 LAB — VITAMIN D 25 HYDROXY (VIT D DEFICIENCY, FRACTURES): Vit D, 25-Hydroxy: 37 ng/mL (ref 30–100)

## 2015-07-07 ENCOUNTER — Other Ambulatory Visit: Payer: Self-pay | Admitting: Internal Medicine

## 2015-07-24 ENCOUNTER — Ambulatory Visit: Payer: Self-pay | Admitting: Internal Medicine

## 2015-09-02 ENCOUNTER — Other Ambulatory Visit: Payer: Self-pay | Admitting: Internal Medicine

## 2015-09-02 DIAGNOSIS — E785 Hyperlipidemia, unspecified: Secondary | ICD-10-CM

## 2015-10-31 ENCOUNTER — Ambulatory Visit (INDEPENDENT_AMBULATORY_CARE_PROVIDER_SITE_OTHER): Payer: Medicare Other | Admitting: Internal Medicine

## 2015-10-31 ENCOUNTER — Encounter: Payer: Self-pay | Admitting: Internal Medicine

## 2015-10-31 VITALS — BP 136/70 | HR 56 | Temp 97.7°F | Resp 16 | Ht 61.0 in | Wt 142.8 lb

## 2015-10-31 DIAGNOSIS — Z23 Encounter for immunization: Secondary | ICD-10-CM | POA: Diagnosis not present

## 2015-10-31 DIAGNOSIS — E1122 Type 2 diabetes mellitus with diabetic chronic kidney disease: Secondary | ICD-10-CM | POA: Diagnosis not present

## 2015-10-31 DIAGNOSIS — Z136 Encounter for screening for cardiovascular disorders: Secondary | ICD-10-CM | POA: Diagnosis not present

## 2015-10-31 DIAGNOSIS — N182 Chronic kidney disease, stage 2 (mild): Secondary | ICD-10-CM

## 2015-10-31 DIAGNOSIS — I2109 ST elevation (STEMI) myocardial infarction involving other coronary artery of anterior wall: Secondary | ICD-10-CM

## 2015-10-31 DIAGNOSIS — Z1212 Encounter for screening for malignant neoplasm of rectum: Secondary | ICD-10-CM

## 2015-10-31 DIAGNOSIS — E782 Mixed hyperlipidemia: Secondary | ICD-10-CM | POA: Diagnosis not present

## 2015-10-31 DIAGNOSIS — I10 Essential (primary) hypertension: Secondary | ICD-10-CM | POA: Diagnosis not present

## 2015-10-31 DIAGNOSIS — Z79899 Other long term (current) drug therapy: Secondary | ICD-10-CM

## 2015-10-31 DIAGNOSIS — E559 Vitamin D deficiency, unspecified: Secondary | ICD-10-CM

## 2015-10-31 DIAGNOSIS — E039 Hypothyroidism, unspecified: Secondary | ICD-10-CM

## 2015-10-31 DIAGNOSIS — Z1211 Encounter for screening for malignant neoplasm of colon: Secondary | ICD-10-CM

## 2015-10-31 DIAGNOSIS — R6889 Other general symptoms and signs: Secondary | ICD-10-CM

## 2015-10-31 DIAGNOSIS — Z0001 Encounter for general adult medical examination with abnormal findings: Secondary | ICD-10-CM | POA: Diagnosis not present

## 2015-10-31 DIAGNOSIS — K219 Gastro-esophageal reflux disease without esophagitis: Secondary | ICD-10-CM

## 2015-10-31 LAB — CBC WITH DIFFERENTIAL/PLATELET
Basophils Absolute: 0 cells/uL (ref 0–200)
Basophils Relative: 0 %
Eosinophils Absolute: 0 cells/uL — ABNORMAL LOW (ref 15–500)
Eosinophils Relative: 0 %
HCT: 36.7 % (ref 35.0–45.0)
Hemoglobin: 12.8 g/dL (ref 11.7–15.5)
Lymphocytes Relative: 55 %
Lymphs Abs: 3080 cells/uL (ref 850–3900)
MCH: 29.9 pg (ref 27.0–33.0)
MCHC: 34.9 g/dL (ref 32.0–36.0)
MCV: 85.7 fL (ref 80.0–100.0)
MPV: 10.2 fL (ref 7.5–12.5)
Monocytes Absolute: 896 cells/uL (ref 200–950)
Monocytes Relative: 16 %
Neutro Abs: 1624 cells/uL (ref 1500–7800)
Neutrophils Relative %: 29 %
Platelets: 212 10*3/uL (ref 140–400)
RBC: 4.28 MIL/uL (ref 3.80–5.10)
RDW: 13.2 % (ref 11.0–15.0)
WBC: 5.6 10*3/uL (ref 3.8–10.8)

## 2015-10-31 LAB — BASIC METABOLIC PANEL WITH GFR
BUN: 8 mg/dL (ref 7–25)
CO2: 22 mmol/L (ref 20–31)
Calcium: 10 mg/dL (ref 8.6–10.4)
Chloride: 106 mmol/L (ref 98–110)
Creat: 0.8 mg/dL (ref 0.60–0.88)
GFR, Est African American: 79 mL/min (ref >=60)
GFR, Est Non African American: 69 mL/min (ref >=60)
Glucose, Bld: 171 mg/dL — ABNORMAL HIGH (ref 65–99)
Potassium: 3.4 mmol/L — ABNORMAL LOW (ref 3.5–5.3)
Sodium: 141 mmol/L (ref 135–146)

## 2015-10-31 LAB — LIPID PANEL
Cholesterol: 130 mg/dL (ref 125–200)
HDL: 52 mg/dL (ref >=46)
LDL Cholesterol: 56 mg/dL (ref <130)
Total CHOL/HDL Ratio: 2.5 Ratio (ref <=5.0)
Triglycerides: 109 mg/dL (ref <150)
VLDL: 22 mg/dL (ref <30)

## 2015-10-31 LAB — HEPATIC FUNCTION PANEL
ALT: 12 U/L (ref 6–29)
AST: 14 U/L (ref 10–35)
Albumin: 4.2 g/dL (ref 3.6–5.1)
Alkaline Phosphatase: 40 U/L (ref 33–130)
Bilirubin, Direct: 0.1 mg/dL (ref <=0.2)
Indirect Bilirubin: 0.6 mg/dL (ref 0.2–1.2)
Total Bilirubin: 0.7 mg/dL (ref 0.2–1.2)
Total Protein: 7.4 g/dL (ref 6.1–8.1)

## 2015-10-31 LAB — TSH: TSH: 0.77 mIU/L

## 2015-10-31 LAB — MAGNESIUM: Magnesium: 1.7 mg/dL (ref 1.5–2.5)

## 2015-10-31 MED ORDER — NITROGLYCERIN 0.4 MG SL SUBL: SUBLINGUAL_TABLET | SUBLINGUAL | 12 refills | Status: DC

## 2015-10-31 NOTE — Progress Notes (Signed)
[facility]   [name], M.D.    Uvaldo Bristle. Silverio Lay, P.A.-C      Starlyn Skeans, P.A.-C  [facility]                [address] [place], [place] [id number] Telephone [phone] Telefax [phone] Annual  Screening/Preventative Visit  & Comprehensive Evaluation & Examination     This very nice 82 y.o. MWF presents for a Screening/Preventative Visit & comprehensive evaluation and management of multiple medical co-morbidities.  Patient has been followed for HTN, T2_NIDDM  Prediabetes, Hyperlipidemia and Vitamin D Deficiency.      She is brought in today by her son [name] as she did not feel able to drive . Apparently about 8-10 days ago she experienced vague chest and L arm "pain" which she is very vague to describe the nature & quality. She also have freq Nausea, but denied any dyspnea, diaphoresis or palpitations . She forbade her son or husband to call "911" and refused to go to the hospital as she refuses today to go to the hospital. EKG appears diagnostic for a recent anterior MI. Patient denies any subsequent CP, dyspnea, dizziness or edema. She apparently has stopped all of her medications for unclear reasons      HTN predates since 44 and in 2013 she had a negative Cardiolite .  Patient reports sporadically taking her Atenolol and Verapamil. atient's BP has been controlled and today's BP is 136/70. Patient denies any cardiac symptoms as chest pain, palpitations, shortness of breath, dizziness or ankle swelling.     Patient's hyperlipidemia is controlled with diet and medications. Patient denies myalgias or other medication SE's. Last lipids were  Lab Results  Component Value Date   CHOL 191 06/19/2015   HDL 58 06/19/2015   LDLCALC 110 06/19/2015   TRIG 117 06/19/2015   CHOLHDL 3.3 06/19/2015      Patient has T2_NIDDM (1996) and patient denies reactive hypoglycemic symptoms, visual  blurring, diabetic polys or paresthesias. Patient admits poor diet compliance, not taking her Metformin and not monitoring her CBG's. Last A1c was not at goal:  Lab Results  Component Value Date   HGBA1C 9.0 (H) 06/19/2015       Finally, patient has history of Vitamin D Deficiency in 2008 of "27" and last vitamin D was still low: Lab Results  Component Value Date   VD25OH 37 06/19/2015   Current Outpatient Prescriptions on File Prior to Visit  Medication Sig  . atenolol (TENORMIN) 100 MG tablet Take 100 mg by mouth daily.  Marland Kitchen atorvastatin (LIPITOR) 80 MG tablet TAKE 1 TABLET DAILY FOR CHOLESTEROL  . sorbitol 70 % solution Take 2 to 4 Tablespoons daily if needed for constipation  . verapamil (CALAN-SR) 240 MG CR tablet TAKE 1 TABLET AT BEDTIME  . metFORMIN (GLUCOPHAGE-XR) 500 MG 24 hr tablet Take 2 tablets (1,000 mg total) by mouth 2 (two) times daily. (Patient not taking: Reported on 10/31/2015)   No current facility-administered medications on file prior to visit.    Allergies  Allergen Reactions  . Actos [Pioglitazone]   . Bee Venom     Eyes swell, cannot breathe  . Persantine [Dipyridamole]     "went crazy"   Past Medical History:  Diagnosis Date  . Anemia   . Cataract   . Colon polyp 2009    TUBULAR ADENOMA  .  Diabetes ([place])   . Diverticulosis of colon (without mention of hemorrhage) 2009  . Esophagitis 2009  . Gastritis 2009  . GERD (gastroesophageal reflux disease) 2009  . Hiatal hernia 2009  . Hyperlipemia   . Hypertension   . Thyroid disease    hypothyroid   Health Maintenance  Topic Date Due  . OPHTHALMOLOGY EXAM  10/02/1941  . INFLUENZA VACCINE  08/08/2015  . FOOT EXAM  10/03/2015  . URINE MICROALBUMIN  10/03/2015  . ZOSTAVAX  01/15/2017 (Originally 10/01/1993)  . HEMOGLOBIN A1C  12/19/2015  . TETANUS/TDAP  02/02/2016  . DEXA SCAN  Completed  . PNA vac Low Risk Adult  Completed   Immunization History  Administered Date(s) Administered  . Influenza  Split 10/03/2014  . Influenza-Unspecified 11/18/2012  . Pneumococcal Conjugate-13 11/04/2013  . Pneumococcal Polysaccharide-23 07/25/2010  . Pneumococcal-Unspecified 02/01/2001  . Td 02/01/2006   Past Surgical History:  Procedure Laterality Date  . BREAST BIOPSY Left 1999   neg  . COLONOSCOPY  05/2012   Dr. Sharlett Iles  . DILATION AND CURETTAGE OF UTERUS  1984   Family History  Problem Relation Age of Onset  . Breast cancer Maternal Aunt   . Throat cancer Maternal Uncle   . Lung cancer Brother   . Prostate cancer Brother   . Diabetes Father   . Rectal cancer Neg Hx   . Stomach cancer Neg Hx   . Colon cancer Neg Hx   . Esophageal cancer Neg Hx   . Stroke Mother    Social History   Social History  . Marital status: Married    Spouse name: N/A  . Number of children: 3  . Years of education: N/A   Occupational History  . Retired    Social History Main Topics  . Smoking status: Never Smoker  . Smokeless tobacco: Current User    Types: Snuff  . Alcohol use No  . Drug use: No  . Sexual activity: Not on file    ROS Constitutional: Denies fever, chills, weight loss/gain, headaches, insomnia,  night sweats or change in appetite. Does c/o fatigue. Eyes: Denies redness, blurred vision, diplopia, discharge, itchy or watery eyes.  ENT: Denies discharge, congestion, post nasal drip, epistaxis, sore throat, earache, hearing loss, dental pain, Tinnitus, Vertigo, Sinus pain or snoring.  Cardio: Denies chest pain, palpitations, irregular heartbeat, syncope, dyspnea, diaphoresis, orthopnea, PND, claudication or edema Respiratory: denies cough, dyspnea, DOE, pleurisy, hoarseness, laryngitis or wheezing.  Gastrointestinal: Denies dysphagia, heartburn, reflux, water brash, pain, cramps, nausea, vomiting, bloating, diarrhea, constipation, hematemesis, melena, hematochezia, jaundice or hemorrhoids Genitourinary: Denies dysuria, frequency, urgency, nocturia, hesitancy, discharge, hematuria  or flank pain Musculoskeletal: Denies arthralgia, myalgia, stiffness, Jt. Swelling, pain, limp or strain/sprain. Denies Falls. Skin: Denies puritis, rash, hives, warts, acne, eczema or change in skin lesion Neuro: No weakness, tremor, incoordination, spasms, paresthesia or pain Psychiatric: Denies confusion, memory loss or sensory loss. Denies Depression. Endocrine: Denies change in weight, skin, hair change, nocturia, and paresthesia, diabetic polys, visual blurring or hyper / hypo glycemic episodes.  Heme/Lymph: No excessive bleeding, bruising or enlarged lymph nodes.  Physical Exam  BP 136/70   Pulse (!) 56   Temp 97.7 F (36.5 C)   Resp 16   Ht 5\' 1"  (1.549 m)   Wt 142 lb 12.8 oz (64.8 kg)   BMI 26.98 kg/m   General Appearance: Over nourished, unkempt in no apparent distress.  Eyes: PERRLA, EOMs, conjunctiva no swelling or erythema, normal fundi and vessels. Sinuses: No frontal/maxillary  tenderness ENT/Mouth: EACs patent / TMs  nl. Nares clear without erythema, swelling, mucoid exudates. Oral hygiene is good. No erythema, swelling, or exudate. Tongue normal, non-obstructing. Tonsils not swollen or erythematous. Hearing normal.  Neck: Supple, thyroid normal. No bruits, nodes or JVD. Respiratory: Respiratory effort normal.  BS equal and clear bilateral without rales, rhonci, wheezing or stridor. Cardio: Heart sounds are normal with regular rate and rhythm and no murmurs, rubs or gallops. Peripheral pulses are normal and equal bilaterally without edema. No aortic or femoral bruits. Chest: symmetric with normal excursions and percussion.  Abdomen: Soft, with Nl bowel sounds. Nontender, no guarding, rebound, hernias, masses, or organomegaly.  Lymphatics: Non tender without lymphadenopathy.  Genitourinary: No hernias.Testes nl. DRE - prostate nl for age - smooth & firm w/o nodules. Musculoskeletal: Full ROM all peripheral extremities, joint stability, 5/5 strength, and normal  gait. Skin: Warm and dry without rashes, lesions, cyanosis, clubbing or  ecchymosis. Hirsute facial - chin. Neuro: Cranial nerves intact, reflexes equal bilaterally. Normal muscle tone, no cerebellar symptoms. Sensation intact to touch, vibratory and monofilament to the toes bilaterally.  Pysch: Alert and oriented X 3 with normal affect, insight limited and judgment poor.   Assessment and Plan  1. Annual Preventative/Screening Exam   - Microalbumin / creatinine urine ratio - EKG 12-Lead - POC Hemoccult Bld/Stl - Urinalysis, Routine w reflex microscopic  - HM DIABETES FOOT EXAM - LOW EXTREMITY NEUR EXAM DOCUM - CBC with Differential/Platelet - BASIC METABOLIC PANEL WITH GFR - Hepatic function panel - Magnesium - Lipid panel - TSH - Hemoglobin A1c - Insulin, random - VITAMIN D 25 Hydroxy  - Troponin I  2. Essential hypertension  - Microalbumin / creatinine urine ratio - EKG 12-Lead - TSH  3. Hyperlipidemia  - EKG 12-Lead - Lipid panel - TSH  4. CKD stage 2 due to type 2 diabetes mellitus (HCC)  - [facility] DIABETES FOOT EXAM - LOW EXTREMITY NEUR EXAM DOCUM - Hemoglobin A1c - Insulin, random  5. Acute MI anterior wall  ([place])  - EKG 12-Lead - Troponin I - Patient is very reticent against going to hospital or seeing a cardiologist.  - Discussed with son, [name], who states , he just wants to honor her wishes. - Rx Nitrostat and instructed in use  6. Vitamin D deficiency  - VITAMIN D 25 Hydroxy  7. Acquired hypothyroidism  - TSH  8. Gastroesophageal reflux disease   9. Screening for rectal cancer   10. Screening for ischemic heart disease  - EKG 12-Lead  11. Screening for colorectal cancer  - POC Hemoccult Bld/Stl   12. Medication management  - Urinalysis, Routine w reflex microscopic  - CBC with Differential/Platelet - BASIC METABOLIC PANEL WITH GFR - Hepatic function panel - Magnesium  13. Need for prophylactic vaccination and inoculation  against influenza  - Flu Vaccine QUAD with preservative       Continue prudent diet as discussed, weight control, BP monitoring, regular exercise, and medications as discussed.  Discussed med effects and SE's. Routine screening labs and tests as requested with regular follow-up as recommended. Over 40 minutes of exam, counseling, chart review and high complex critical decision making was performed

## 2015-10-31 NOTE — Patient Instructions (Addendum)
Heart Attack A heart attack (myocardial infarction, MI) causes damage to your heart that cannot be fixed. A heart attack can happen when a heart (coronary) artery becomes blocked or narrowed. This cuts off the blood supply and oxygen to your heart. When one or more of your coronary arteries becomes blocked, that area of your heart begins to die. This causes the pain you feel during a heart attack. Heart attack pain can also occur in one part of the body but be felt in another part of the body (referred pain). You may feel referred heart attack pain in your left arm, neck, or jaw. Pain may even be felt in the right arm. CAUSES  Many conditions can cause a heart attack. These include:   Atherosclerosis. This is when a fatty substance (plaque) gradually builds up in the arteries. This buildup can block or reduce the blood supply to one or more of the heart arteries.  A blood clot. A blood clot can develop suddenly when plaque breaks up (ruptures) within a heart artery. A blood clot can block the heart artery, which prevents blood flow to the heart.   Severe tightening (spasm) of the heart artery. This cuts off blood flow through the artery.  RISK FACTORS People at risk for heart attack usually have one or more of the following risk factors:   High blood pressure (hypertension).  High cholesterol.  Smoking.  Being female.  Being overweight or obese.  Older aged.   A family history of heart disease.  Lack of exercise.  Diabetes.  Stress.  Drinking too much alcohol.  Using illegal street drugs, such as cocaine and methamphetamines. SYMPTOMS  Heart attack symptoms can vary from person to person. Symptoms depend on factors like gender and age.   In both men and women, heart attack symptoms can include the following:   Chest pain. This may feel like crushing, squeezing, or a feeling of pressure.  Shortness of breath.  Heartburn or indigestion with or without vomiting,  shortness of breath, or sweating.  Sudden cold sweats.  Sudden light-headedness.  Upper back pain.   Women can have unique heart attack symptoms, such as:   Unexplained feelings of nervousness or anxiety.  Discomfort between the shoulder blades or upper back.  Tingling in the hands and arms.   Older people (of both genders) can have subtle heart attack symptoms, such as:   Sweating.  Shortness of breath.  General tiredness or not feeling well.  DIAGNOSIS  Diagnosing a heart attack involves several tests. They include:   An assessment of your vital signs. This includes checking your:  Heart rhythm.  Blood pressure.  Breathing rate.  Oxygen level.   An ECG (electrocardiogram) to measure the electrical activity of your heart.  Blood tests called cardiac markers. In these tests, blood is drawn at scheduled times to check for the specific proteins or enzymes released by damaged heart muscle.  A chest X-ray.  An echocardiogram to evaluate heart motion and blood flow.  Coronary angiography to look at the heart arteries.  TREATMENT  Treatment for a heart attack may include:   Medicine that breaks up or dissolves blood clots in the heart artery.  Angioplasty.  Cardiac stent placement.  Intra-aortic balloon pump therapy (IABP).  Open heart surgery (coronary artery bypass graft, CABG). HOME CARE INSTRUCTIONS  Take medicines only as directed by your health care provider. You may need to take medicine after a heart attack to:   Keep your blood from   clotting too easily.  Control your blood pressure.  Lower your cholesterol.  Control abnormal heart rhythms.   Do not take the following medicines unless your health care provider approves:  Nonsteroidal anti-inflammatory drugs (NSAIDs), such as ibuprofen, naproxen, or celecoxib.  Vitamin supplements that contain vitamin A, vitamin E, or both.  Hormone replacement therapy that contains estrogen with or  without progestin.  Make lifestyle changes as directed by your health care provider. These may include:   Quitting smoking, if you smoke.  Getting regular exercise. Ask your health care provider to suggest some activities that are safe for you.  Eating a heart-healthy diet. A registered dietitian can help you learn healthy eating options.  Maintaining a healthy weight.   Managing diabetes, if necessary.  Reducing stress.  Limiting how much alcohol you drink. SEEK IMMEDIATE MEDICAL CARE IF:   You have sudden, unexplained chest discomfort.  You have sudden, unexplained discomfort in your arms, back, neck, or jaw.  You have shortness of breath at any time.  You suddenly start to sweat or your skin gets clammy.  You feel nauseous or vomit.  You suddenly feel light-headed or dizzy.  Your heart begins to beat fast or feels like it is skipping beats. These symptoms may represent a serious problem that is an emergency. Do not wait to see if the symptoms will go away. Get medical help right away. Call your local emergency services (911 in the [place] Do not drive yourself to the hospital.   This information is not intended to replace advice given to you by your health care provider. Make sure you discuss any questions you have with your health care provider.   Document Released: 12/24/2004 Document Revised: 01/14/2014 Document Reviewed: 02/26/2013 Elsevier Interactive Patient Education 2016 [employer]  +++++++++++++++++++++++++++++++ Preventive Care for Adults  A healthy lifestyle and preventive care can promote health and wellness. Preventive health guidelines for women include the following key practices.  A routine yearly physical is a good way to check with your health care provider about your health and preventive screening. It is a chance to share any concerns and updates on your health and to receive a thorough exam.  Visit your dentist for a routine exam and  preventive care every 6 months. Brush your teeth twice a day and floss once a day. Good oral hygiene prevents tooth decay and gum disease.  The frequency of eye exams is based on your age, health, family medical history, use of contact lenses, and other factors. Follow your health care provider's recommendations for frequency of eye exams.  Eat a healthy diet. Foods like vegetables, fruits, whole grains, low-fat dairy products, and lean protein foods contain the nutrients you need without too many calories. Decrease your intake of foods high in solid fats, added sugars, and salt. Eat the right amount of calories for you.Get information about a proper diet from your health care provider, if necessary.  Regular physical exercise is one of the most important things you can do for your health. Most adults should get at least 150 minutes of moderate-intensity exercise (any activity that increases your heart rate and causes you to sweat) each week. In addition, most adults need muscle-strengthening exercises on 2 or more days a week.  Maintain a healthy weight. The body mass index (BMI) is a screening tool to identify possible weight problems. It provides an estimate of body fat based on height and weight. Your health care provider can find your BMI and can help  you achieve or maintain a healthy weight.For adults 20 years and older:  A BMI below 18.5 is considered underweight.  A BMI of 18.5 to 24.9 is normal.  A BMI of 25 to 29.9 is considered overweight.  A BMI of 30 and above is considered obese.  Maintain normal blood lipids and cholesterol levels by exercising and minimizing your intake of saturated fat. Eat a balanced diet with plenty of fruit and vegetables. If your lipid or cholesterol levels are high, you are over 50, or you are at high risk for heart disease, you may need your cholesterol levels checked more frequently.Ongoing high lipid and cholesterol levels should be treated with  medicines if diet and exercise are not working.  If you smoke, find out from your health care provider how to quit. If you do not use tobacco, do not start.  Lung cancer screening is recommended for adults aged 55-80 years who are at high risk for developing lung cancer because of a history of smoking. A yearly low-dose CT scan of the lungs is recommended for people who have at least a 30-pack-year history of smoking and are a current smoker or have quit within the past 15 years. A pack year of smoking is smoking an average of 1 pack of cigarettes a day for 1 year (for example: 1 pack a day for 30 years or 2 packs a day for 15 years). Yearly screening should continue until the smoker has stopped smoking for at least 15 years. Yearly screening should be stopped for people who develop a health problem that would prevent them from having lung cancer treatment.  Avoid use of street drugs. Do not share needles with anyone. Ask for help if you need support or instructions about stopping the use of drugs.  High blood pressure causes heart disease and increases the risk of stroke.  Ongoing high blood pressure should be treated with medicines if weight loss and exercise do not work.  If you are 37-70 years old, ask your health care provider if you should take aspirin to prevent strokes.  Diabetes screening involves taking a blood sample to check your fasting blood sugar level. This should be done once every 3 years, after age 3, if you are within normal weight and without risk factors for diabetes. Testing should be considered at a younger age or be carried out more frequently if you are overweight and have at least 1 risk factor for diabetes.  Breast cancer screening is essential preventive care for women. You should practice "breast self-awareness." This means understanding the normal appearance and feel of your breasts and may include breast self-examination. Any changes detected, no matter how small,  should be reported to a health care provider. Women in their 29s and 30s should have a clinical breast exam (CBE) by a health care provider as part of a regular health exam every 1 to 3 years. After age 31, women should have a CBE every year. Starting at age 96, women should consider having a mammogram (breast X-ray test) every year. Women who have a family history of breast cancer should talk to their health care provider about genetic screening. Women at a high risk of breast cancer should talk to their health care providers about having an MRI and a mammogram every year.  Breast cancer gene (BRCA)-related cancer risk assessment is recommended for women who have family members with BRCA-related cancers. BRCA-related cancers include breast, ovarian, tubal, and peritoneal cancers. Having family members with these  cancers may be associated with an increased risk for harmful changes (mutations) in the breast cancer genes BRCA1 and BRCA2. Results of the assessment will determine the need for genetic counseling and BRCA1 and BRCA2 testing.  Routine pelvic exams to screen for cancer are no longer recommended for nonpregnant women who are considered low risk for cancer of the pelvic organs (ovaries, uterus, and vagina) and who do not have symptoms. Ask your health care provider if a screening pelvic exam is right for you.  If you have had past treatment for cervical cancer or a condition that could lead to cancer, you need Pap tests and screening for cancer for at least 20 years after your treatment. If Pap tests have been discontinued, your risk factors (such as having a new sexual partner) need to be reassessed to determine if screening should be resumed. Some women have medical problems that increase the chance of getting cervical cancer. In these cases, your health care provider may recommend more frequent screening and Pap tests.    Colorectal cancer can be detected and often prevented. Most routine  colorectal cancer screening begins at the age of 16 years and continues through age 86 years. However, your health care provider may recommend screening at an earlier age if you have risk factors for colon cancer. On a yearly basis, your health care provider may provide home test kits to check for hidden blood in the stool. Use of a small camera at the end of a tube, to directly examine the colon (sigmoidoscopy or colonoscopy), can detect the earliest forms of colorectal cancer. Talk to your health care provider about this at age 37, when routine screening begins. Direct exam of the colon should be repeated every 5-10 years through age 74 years, unless early forms of pre-cancerous polyps or small growths are found.  Osteoporosis is a disease in which the bones lose minerals and strength with aging. This can result in serious bone fractures or breaks. The risk of osteoporosis can be identified using a bone density scan. Women ages 66 years and over and women at risk for fractures or osteoporosis should discuss screening with their health care providers. Ask your health care provider whether you should take a calcium supplement or vitamin D to reduce the rate of osteoporosis.  Menopause can be associated with physical symptoms and risks. Hormone replacement therapy is available to decrease symptoms and risks. You should talk to your health care provider about whether hormone replacement therapy is right for you.  Use sunscreen. Apply sunscreen liberally and repeatedly throughout the day. You should seek shade when your shadow is shorter than you. Protect yourself by wearing long sleeves, pants, a wide-brimmed hat, and sunglasses year round, whenever you are outdoors.  Once a month, do a whole body skin exam, using a mirror to look at the skin on your back. Tell your health care provider of new moles, moles that have irregular borders, moles that are larger than a pencil eraser, or moles that have changed in  shape or color.  Stay current with required vaccines (immunizations).  Influenza vaccine. All adults should be immunized every year.  Tetanus, diphtheria, and acellular pertussis (Td, Tdap) vaccine. Pregnant women should receive 1 dose of Tdap vaccine during each pregnancy. The dose should be obtained regardless of the length of time since the last dose. Immunization is preferred during the 27th-36th week of gestation. An adult who has not previously received Tdap or who does not know her vaccine status  should receive 1 dose of Tdap. This initial dose should be followed by tetanus and diphtheria toxoids (Td) booster doses every 10 years. Adults with an unknown or incomplete history of completing a 3-dose immunization series with Td-containing vaccines should begin or complete a primary immunization series including a Tdap dose. Adults should receive a Td booster every 10 years.    Zoster vaccine. One dose is recommended for adults aged 17 years or older unless certain conditions are present.    Pneumococcal 13-valent conjugate (PCV13) vaccine. When indicated, a person who is uncertain of her immunization history and has no record of immunization should receive the PCV13 vaccine. An adult aged 41 years or older who has certain medical conditions and has not been previously immunized should receive 1 dose of PCV13 vaccine. This PCV13 should be followed with a dose of pneumococcal polysaccharide (PPSV23) vaccine. The PPSV23 vaccine dose should be obtained at least 8 weeks after the dose of PCV13 vaccine. An adult aged 104 years or older who has certain medical conditions and previously received 1 or more doses of PPSV23 vaccine should receive 1 dose of PCV13. The PCV13 vaccine dose should be obtained 1 or more years after the last PPSV23 vaccine dose.    Pneumococcal polysaccharide (PPSV23) vaccine. When PCV13 is also indicated, PCV13 should be obtained first. All adults aged 50 years and older should  be immunized. An adult younger than age 47 years who has certain medical conditions should be immunized. Any person who resides in a nursing home or long-term care facility should be immunized. An adult smoker should be immunized. People with an immunocompromised condition and certain other conditions should receive both PCV13 and PPSV23 vaccines. People with human immunodeficiency virus (HIV) infection should be immunized as soon as possible after diagnosis. Immunization during chemotherapy or radiation therapy should be avoided. Routine use of PPSV23 vaccine is not recommended for American Indians, [place]atives, or people younger than 65 years unless there are medical conditions that require PPSV23 vaccine. When indicated, people who have unknown immunization and have no record of immunization should receive PPSV23 vaccine. One-time revaccination 5 years after the first dose of PPSV23 is recommended for people aged 19-64 years who have chronic kidney failure, nephrotic syndrome, asplenia, or immunocompromised conditions. People who received 1-2 doses of PPSV23 before age 81 years should receive another dose of PPSV23 vaccine at age 40 years or later if at least 5 years have passed since the previous dose. Doses of PPSV23 are not needed for people immunized with PPSV23 at or after age 12 years.   Preventive Services / Frequency  Ages 16 years and over  Blood pressure check.  Lipid and cholesterol check.  Lung cancer screening. / Every year if you are aged 22-80 years and have a 30-pack-year history of smoking and currently smoke or have quit within the past 15 years. Yearly screening is stopped once you have quit smoking for at least 15 years or develop a health problem that would prevent you from having lung cancer treatment.  Clinical breast exam.** / Every year after age 72 years.  BRCA-related cancer risk assessment.** / For women who have family members with a BRCA-related cancer (breast,  ovarian, tubal, or peritoneal cancers).  Mammogram.** / Every year beginning at age 79 years and continuing for as long as you are in good health. Consult with your health care provider.  Pap test.** / Every 3 years starting at age 28 years through age 77 or 75 years  with 3 consecutive normal Pap tests. Testing can be stopped between 65 and 70 years with 3 consecutive normal Pap tests and no abnormal Pap or HPV tests in the past 10 years.  Fecal occult blood test (FOBT) of stool. / Every year beginning at age 57 years and continuing until age 82 years. You may not need to do this test if you get a colonoscopy every 10 years.  Flexible sigmoidoscopy or colonoscopy.** / Every 5 years for a flexible sigmoidoscopy or every 10 years for a colonoscopy beginning at age 42 years and continuing until age 16 years.  Hepatitis C blood test.** / For all people born from [birth year] through 1963 and any individual with known risks for hepatitis C.  Osteoporosis screening.** / A one-time screening for women ages 24 years and over and women at risk for fractures or osteoporosis.  Skin self-exam. / Monthly.  Influenza vaccine. / Every year.  Tetanus, diphtheria, and acellular pertussis (Tdap/Td) vaccine.** / 1 dose of Td every 10 years.  Zoster vaccine.** / 1 dose for adults aged 66 years or older.  Pneumococcal 13-valent conjugate (PCV13) vaccine.** / Consult your health care provider.  Pneumococcal polysaccharide (PPSV23) vaccine.** / 1 dose for all adults aged 3 years and older. Screening for abdominal aortic aneurysm (AAA)  by ultrasound is recommended for people who have history of high blood pressure or who are current or former smokers. ++++++++++++++++++++ Recommend Adult Low Dose Aspirin or  coated  Aspirin 81 mg daily  To reduce risk of Colon Cancer 20 %,  Skin Cancer 26 % ,  Melanoma 46%  and  Pancreatic cancer 60% ++++++++++++++++++++ Vitamin D goal  is between 70-100.  Please make sure  that you are taking your Vitamin D as directed.  It is very important as a natural anti-inflammatory  helping hair, skin, and nails, as well as reducing stroke and heart attack risk.  It helps your bones and helps with mood. It also decreases numerous cancer risks so please take it as directed.  Low Vit D is associated with a 200-300% higher risk for CANCER  and 200-300% higher risk for HEART   ATTACK  &  STROKE.   .....................................Marland Kitchen It is also associated with higher death rate at younger ages,  autoimmune diseases like Rheumatoid arthritis, Lupus, Multiple Sclerosis.    Also many other serious conditions, like depression, Alzheimer's Dementia, infertility, muscle aches, fatigue, fibromyalgia - just to name a few. ++++++++++++++++++ Recommend the book "The END of DIETING" by Dr Excell Seltzer  & the book "The END of DIABETES " by Dr Excell Seltzer At [email or web address] - get book & Audio CD's    Being diabetic has a  300% increased risk for heart attack, stroke, cancer, and alzheimer- type vascular dementia. It is very important that you work harder with diet by avoiding all foods that are white. Avoid white rice (brown & wild rice is OK), white potatoes (sweetpotatoes in moderation is OK), White bread or wheat bread or anything made out of white flour like bagels, donuts, rolls, buns, biscuits, cakes, pastries, cookies, pizza crust, and pasta (made from white flour & egg whites) - vegetarian pasta or spinach or wheat pasta is OK. Multigrain breads like Arnold's or Pepper[employer], or multigrain sandwich thins or flatbreads.  Diet, exercise and weight loss can reverse and cure diabetes in the early stages.  Diet, exercise and weight loss is very important in the control and prevention of complications of diabetes which affects every system in  your body, ie. Brain - dementia/stroke, eyes - glaucoma/blindness, heart - heart attack/heart failure, kidneys - dialysis, stomach - gastric paralysis,  intestines - malabsorption, nerves - severe painful neuritis, circulation - gangrene & loss of a leg(s), and finally cancer and Alzheimers.    I recommend avoid fried & greasy foods,  sweets/candy, white rice (brown or wild rice or Quinoa is OK), white potatoes (sweet potatoes are OK) - anything made from white flour - bagels, doughnuts, rolls, buns, biscuits,white and wheat breads, pizza crust and traditional pasta made of white flour & egg white(vegetarian pasta or spinach or wheat pasta is OK).  Multi-grain bread is OK - like multi-grain flat bread or sandwich thins. Avoid alcohol in excess. Exercise is also important.    Eat all the vegetables you want - avoid meat, especially red meat and dairy - especially cheese.  Cheese is the most concentrated form of trans-fats which is the worst thing to clog up our arteries. Veggie cheese is OK which can be found in the fresh produce section at [employer] or [employer] or [employer]  +++++++++++++++++++ DASH Eating Plan  DASH stands for "Dietary Approaches to Stop Hypertension."   The DASH eating plan is a healthy eating plan that has been shown to reduce high blood pressure (hypertension). Additional health benefits may include reducing the risk of type 2 diabetes mellitus, heart disease, and stroke. The DASH eating plan may also help with weight loss. WHAT DO I NEED TO KNOW ABOUT THE DASH EATING PLAN? For the DASH eating plan, you will follow these general guidelines:  Choose foods with a percent daily value for sodium of less than 5% (as listed on the food label).  Use salt-free seasonings or herbs instead of table salt or sea salt.  Check with your health care provider or pharmacist before using salt substitutes.  Eat lower-sodium products, often labeled as "lower sodium" or "no salt added."  Eat fresh foods.  Eat more vegetables, fruits, and low-fat dairy products.  Choose whole grains. Look for the word "whole" as the first word in the  ingredient list.  Choose fish   Limit sweets, desserts, sugars, and sugary drinks.  Choose heart-healthy fats.  Eat veggie cheese   Eat more home-cooked food and less restaurant, buffet, and fast food.  Limit fried foods.  Cook foods using methods other than frying.  Limit canned vegetables. If you do use them, rinse them well to decrease the sodium.  When eating at a restaurant, ask that your food be prepared with less salt, or no salt if possible.                      WHAT FOODS CAN I EAT? Read Dr Fara Olden Fuhrman's books on The End of Dieting & The End of Diabetes  Grains Whole grain or whole wheat bread. Brown rice. Whole grain or whole wheat pasta. Quinoa, bulgur, and whole grain cereals. Low-sodium cereals. Corn or whole wheat flour tortillas. Whole grain cornbread. Whole grain crackers. Low-sodium crackers.  Vegetables Fresh or frozen vegetables (raw, steamed, roasted, or grilled). Low-sodium or reduced-sodium tomato and vegetable juices. Low-sodium or reduced-sodium tomato sauce and paste. Low-sodium or reduced-sodium canned vegetables.   Fruits All fresh, canned (in natural juice), or frozen fruits.  Protein Products  All fish and seafood.  Dried beans, peas, or lentils. Unsalted nuts and seeds. Unsalted canned beans.  Dairy Low-fat dairy products, such as skim or 1% milk, 2% or reduced-fat cheeses, low-fat ricotta  or cottage cheese, or plain low-fat yogurt. Low-sodium or reduced-sodium cheeses.  Fats and Oils Tub margarines without trans fats. Light or reduced-fat mayonnaise and salad dressings (reduced sodium). Avocado. Safflower, olive, or canola oils. Natural peanut or almond butter.  Other Unsalted popcorn and pretzels. The items listed above may not be a complete list of recommended foods or beverages. Contact your dietitian for more options.  +++++++++++++++  WHAT FOODS ARE NOT RECOMMENDED? Grains/ White flour or wheat flour White bread. White pasta. White  rice. Refined cornbread. Bagels and croissants. Crackers that contain trans fat.  Vegetables  Creamed or fried vegetables. Vegetables in a . Regular canned vegetables. Regular canned tomato sauce and paste. Regular tomato and vegetable juices.  Fruits Dried fruits. Canned fruit in light or heavy syrup. Fruit juice.  Meat and Other Protein Products Meat in general - RED meat & White meat.  Fatty cuts of meat. Ribs, chicken wings, all processed meats as bacon, sausage, bologna, salami, fatback, hot dogs, bratwurst and packaged luncheon meats.  Dairy Whole or 2% milk, cream, half-and-half, and cream cheese. Whole-fat or sweetened yogurt. Full-fat cheeses or blue cheese. Non-dairy creamers and whipped toppings. Processed cheese, cheese spreads, or cheese curds.  Condiments Onion and garlic salt, seasoned salt, table salt, and sea salt. Canned and packaged gravies. Worcestershire sauce. Tartar sauce. Barbecue sauce. Teriyaki sauce. Soy sauce, including reduced sodium. Steak sauce. Fish sauce. Oyster sauce. Cocktail sauce. Horseradish. Ketchup and mustard. Meat flavorings and tenderizers. Bouillon cubes. Hot sauce. Tabasco sauce. Marinades. Taco seasonings. Relishes.  Fats and Oils Butter, stick margarine, lard, shortening and bacon fat. Coconut, palm kernel, or palm oils. Regular salad dressings.  Pickles and olives. Salted popcorn and pretzels.  The items listed above may not be a complete list of foods and beverages to avoid.

## 2015-11-01 ENCOUNTER — Encounter: Payer: Self-pay | Admitting: *Deleted

## 2015-11-01 ENCOUNTER — Other Ambulatory Visit: Payer: Self-pay | Admitting: Internal Medicine

## 2015-11-01 LAB — HEMOGLOBIN A1C
Hgb A1c MFr Bld: 9.1 % — ABNORMAL HIGH (ref <5.7)
Mean Plasma Glucose: 214 mg/dL

## 2015-11-01 LAB — MICROALBUMIN / CREATININE URINE RATIO
Creatinine, Urine: 108 mg/dL (ref 20–320)
Microalb Creat Ratio: 9 mcg/mg creat (ref <30)
Microalb, Ur: 1 mg/dL

## 2015-11-01 LAB — INSULIN, RANDOM: Insulin: 7.4 u[IU]/mL (ref 2.0–19.6)

## 2015-11-01 LAB — URINALYSIS, ROUTINE W REFLEX MICROSCOPIC
Bilirubin Urine: NEGATIVE
Glucose, UA: NEGATIVE
Hgb urine dipstick: NEGATIVE
Leukocytes, UA: NEGATIVE
Nitrite: NEGATIVE
Protein, ur: NEGATIVE
Specific Gravity, Urine: 1.012 (ref 1.001–1.035)
pH: 6 (ref 5.0–8.0)

## 2015-11-01 LAB — VITAMIN D 25 HYDROXY (VIT D DEFICIENCY, FRACTURES): Vit D, 25-Hydroxy: 36 ng/mL (ref 30–100)

## 2015-11-01 LAB — TROPONIN I: Troponin I: 0.01 ng/mL (ref <=0.05)

## 2015-11-03 ENCOUNTER — Encounter: Payer: Self-pay | Admitting: Internal Medicine

## 2015-11-03 ENCOUNTER — Ambulatory Visit: Payer: Self-pay | Admitting: Internal Medicine

## 2015-11-03 ENCOUNTER — Ambulatory Visit (INDEPENDENT_AMBULATORY_CARE_PROVIDER_SITE_OTHER): Payer: Medicare Other | Admitting: Internal Medicine

## 2015-11-03 VITALS — BP 146/70 | HR 94 | Temp 98.0°F | Resp 14 | Ht 61.0 in | Wt 145.0 lb

## 2015-11-03 DIAGNOSIS — Z0000 Encounter for general adult medical examination without abnormal findings: Secondary | ICD-10-CM

## 2015-11-03 DIAGNOSIS — N182 Chronic kidney disease, stage 2 (mild): Secondary | ICD-10-CM | POA: Diagnosis not present

## 2015-11-03 DIAGNOSIS — Z79899 Other long term (current) drug therapy: Secondary | ICD-10-CM

## 2015-11-03 DIAGNOSIS — I200 Unstable angina: Secondary | ICD-10-CM | POA: Diagnosis not present

## 2015-11-03 DIAGNOSIS — R9431 Abnormal electrocardiogram [ECG] [EKG]: Secondary | ICD-10-CM

## 2015-11-03 DIAGNOSIS — E559 Vitamin D deficiency, unspecified: Secondary | ICD-10-CM | POA: Diagnosis not present

## 2015-11-03 DIAGNOSIS — K219 Gastro-esophageal reflux disease without esophagitis: Secondary | ICD-10-CM | POA: Diagnosis not present

## 2015-11-03 DIAGNOSIS — Z0001 Encounter for general adult medical examination with abnormal findings: Secondary | ICD-10-CM | POA: Diagnosis not present

## 2015-11-03 DIAGNOSIS — E1122 Type 2 diabetes mellitus with diabetic chronic kidney disease: Secondary | ICD-10-CM | POA: Diagnosis not present

## 2015-11-03 DIAGNOSIS — E039 Hypothyroidism, unspecified: Secondary | ICD-10-CM | POA: Diagnosis not present

## 2015-11-03 DIAGNOSIS — I10 Essential (primary) hypertension: Secondary | ICD-10-CM | POA: Diagnosis not present

## 2015-11-03 DIAGNOSIS — E663 Overweight: Secondary | ICD-10-CM

## 2015-11-03 DIAGNOSIS — E782 Mixed hyperlipidemia: Secondary | ICD-10-CM | POA: Diagnosis not present

## 2015-11-03 DIAGNOSIS — R6889 Other general symptoms and signs: Secondary | ICD-10-CM | POA: Diagnosis not present

## 2015-11-03 MED ORDER — MIRTAZAPINE 15 MG PO TABS: 15.0000 mg | ORAL_TABLET | Freq: Every day | ORAL | 2 refills | Status: DC

## 2015-11-03 MED ORDER — ISOSORBIDE MONONITRATE ER 30 MG PO TB24: 30.0000 mg | ORAL_TABLET | Freq: Every day | ORAL | 11 refills | Status: DC

## 2015-11-03 MED ORDER — POLYETHYLENE GLYCOL 3350 17 G PO PACK: 17.0000 g | PACK | Freq: Every day | ORAL | 0 refills | Status: DC

## 2015-11-03 NOTE — Progress Notes (Signed)
MEDICARE ANNUAL WELLNESS VISIT AND FOLLOW UP  Assessment:    1. Essential hypertension -poorly controlled -is taking her atenolol and verapamil at this time again -start on imdur in the hopes that this will decrease active chest pain -patient is aware that the imdur may decrease her blood pressure and is to monitor at home  2. Gastroesophageal reflux disease, esophagitis presence not specified -not currently an issue although there is history  3. CKD stage 2 due to type 2 diabetes mellitus ([place]) -cont metformin -try to monitor CBGs -having hard time preparing food -will try to refer to meals on wheels  4. Acquired hypothyroidism -TSH was done on 10/31/15 -normal level  5. Hyperlipidemia -cont lipitor  6. Medication management -labs at next visit  7. Overweight (BMI 25.0-29.9) -dash diet  8. Vitamin D deficiency -cont Vit D supplement  9. Unstable angina (HCC) -imdur -discussed that if taking more than 2 NTG ever that the patient needs to go to the ER as this can be active NSTEMI  -patient will likely need heart cath -on my exam soft murmur which I believe is new per chart review and may evidence some chronic damage may need echo as well - Ambulatory referral to Cardiology  10. Nonspecific abnormal electrocardiogram (ECG) (EKG) -diffuse T wave changes in the anterolateral leads suggesting possible ischemia -pateint refuses to go to the hospital - Ambulatory referral to Cardiology    Over 30 minutes of exam, counseling, chart review, and critical decision making was performed  Future Appointments Date Time Provider [place]  12/04/2016 10:00 AM [name], MD [facility] None    Plan:   During the course of the visit the patient was educated and counseled about appropriate screening and preventive services including:    Pneumococcal vaccine   Influenza vaccine  Td vaccine  Prevnar 13  Screening electrocardiogram  Screening  mammography  Bone densitometry screening  Colorectal cancer screening  Diabetes screening  Glaucoma screening  Nutrition counseling   Advanced directives: given info/requested copies   Subjective:   [name] [name] is a 82 y.o. female who presents for Medicare Annual Wellness Visit and 3 day follow-up of abnormal ekg.  Patient was seen this week for uncontrolled nausea, vomiting, chest pain and apathy.  Patient recently lost both her daughter and her brother.  She has continued to have chest pains at home.  She has taken 4 nitroglycerin in a single day.  She does not want to go to the hospital and despite her son wanting to take her he was unable to convince her to go to the ER.  She is now taking her medications again per her report.   Her son also reports that she is not eating well.  She only eats cheese and peanut butter.  She has to take care of her nearly blind husband who is mean to her.  She reports that she is not eating well because she has a poor appetite and also is not interested in going out to get supplies.    She is an uncontrolled diabetic.  She has no idea what blood sugars are running.  Last A1C level was  Lab Results  Component Value Date   HGBA1C 9.1 (H) 10/31/2015   She has seen cardiology in the past and has seen Dr. Stanford Breed.  Medication Review Current Outpatient Prescriptions on File Prior to Visit  Medication Sig Dispense Refill  . atenolol (TENORMIN) 100 MG tablet Take 100 mg by mouth daily.    Marland Kitchen  atorvastatin (LIPITOR) 80 MG tablet TAKE 1 TABLET DAILY FOR CHOLESTEROL 90 tablet 1  . metFORMIN (GLUCOPHAGE-XR) 500 MG 24 hr tablet Take 2 tablets (1,000 mg total) by mouth 2 (two) times daily. 360 tablet 1  . nitroGLYCERIN (NITROSTAT) 0.4 MG SL tablet Dissolve 1 tablet under tongue every 3 to 5 minutes if needed for ChestPain 50 tablet 12  . verapamil (CALAN-SR) 240 MG CR tablet TAKE 1 TABLET AT BEDTIME 90 tablet 1   No current facility-administered medications  on file prior to visit.     Allergies: Allergies  Allergen Reactions  . Actos [Pioglitazone]   . Bee Venom     Eyes swell, cannot breathe  . Persantine [Dipyridamole]     "went crazy"    Current Problems (verified) has CKD stage 2 due to type 2 diabetes mellitus ([place]); Hyperlipidemia; Obesity; Essential hypertension; GERD (gastroesophageal reflux disease); Hypothyroidism; Vitamin D deficiency; and Medication management on her problem list.  Screening Tests Immunization History  Administered Date(s) Administered  . Influenza Split 10/03/2014  . Influenza,inj,quad, With Preservative 10/31/2015  . Influenza-Unspecified 11/18/2012  . Pneumococcal Conjugate-13 11/04/2013  . Pneumococcal Polysaccharide-23 07/25/2010  . Pneumococcal-Unspecified 02/01/2001  . Td 02/01/2006    Preventative care: Last colonoscopy: 2014 Last mammogram: 2007? Last pap smear/pelvic exam: 2012 DEXA:2015  Prior vaccinations: TD or Tdap: 2008, declines due to cost  Influenza: 2017  Pneumococcal: 2012 Prevnar13: 2015 Shingles/Zostavax: Declined due to cost  Names of Other Physician/Practitioners you currently use: 1.  [facility]- here for primary care 2. Dr. Katy Fitch, eye doctor, last visit 2017 3. Does not see, dentist, last visit unknown Patient Care Team: [name], MD as PCP - General (Internal Medicine) Warden Fillers, MD as Consulting Physician (Optometry) Lelon Perla, MD as Consulting Physician (Cardiology) Sable Feil, MD as Consulting Physician (Gastroenterology)  Surgical: She  has a past surgical history that includes Dilation and curettage of uterus (1984); Breast biopsy (Left, 1999); and Colonoscopy (05/2012). Family Her family history includes Breast cancer in her maternal aunt; Diabetes in her father; Lung cancer in her brother; Prostate cancer in her brother; Stroke in her mother; Throat cancer in her maternal uncle. Social  history  She reports that she has never smoked. Her smokeless tobacco use includes Snuff. She reports that she does not drink alcohol or use drugs.  MEDICARE WELLNESS OBJECTIVES: Physical activity:   Cardiac risk factors:   Depression/mood screen:   Depression screen [facility] 2/9 10/31/2015  Decreased Interest 0  Down, Depressed, Hopeless 0  PHQ - 2 Score 0    ADLs:  In your present state of health, do you have any difficulty performing the following activities: 10/31/2015 06/19/2015  Hearing? N Y  Vision? N N  Difficulty concentrating or making decisions? N N  Walking or climbing stairs? N N  Dressing or bathing? N N  Doing errands, shopping? N N  Some recent data might be hidden     Cognitive Testing  Alert? Yes  Normal Appearance?Yes  Oriented to person? Yes  Place? Yes   Time? Yes  Recall of three objects?  Yes  Can perform simple calculations? Yes  Displays appropriate judgment?Yes  Can read the correct time from a watch face?Yes  EOL planning:     Objective:   Today's Vitals   11/03/15 1046  BP: (!) 146/70  Pulse: 94  Resp: 14  Temp: 98 F (36.7 C)  TempSrc: Temporal  Weight: 145 lb (65.8 kg)  Height: 5\' 1"  (  1.549 m)   Body mass index is 27.4 kg/m.  General appearance: alert, no distress, WD/WN,  female HEENT: normocephalic, sclerae anicteric, TMs pearly, nares patent, no discharge or erythema, pharynx normal, hirsutism to the face Oral cavity: MMM, no lesions Neck: supple, no lymphadenopathy, no thyromegaly, no masses Heart: Ireg rate, normal 2/6 murmur heard best 3rd LICS.  Mild click to murmur.   Lungs: CTA bilaterally, no wheezes, rhonchi, or rales Abdomen: +bs, soft, non tender, non distended, no masses, no hepatomegaly, no splenomegaly Musculoskeletal: nontender, no swelling, no obvious deformity Extremities: no edema, no cyanosis, no clubbing Pulses: 2+ symmetric, upper and lower extremities, normal cap refill Neurological: alert, oriented x 3,  CN2-12 intact, strength normal upper extremities and lower extremities, sensation normal throughout, DTRs 2+ throughout, no cerebellar signs, gait normal Psychiatric: Flat depressed affect, minimally responsive to speech, reluctant to answer questions, behavior normal, pleasant  Breast: defer Gyn: defer Rectal: defer   Medicare Attestation I have personally reviewed: The patient's medical and social history Their use of alcohol, tobacco or illicit drugs Their current medications and supplements The patient's functional ability including ADLs,fall risks, home safety risks, cognitive, and hearing and visual impairment Diet and physical activities Evidence for depression or mood disorders  The patient's weight, height, BMI, and visual acuity have been recorded in the chart.  I have made referrals, counseling, and provided education to the patient based on review of the above and I have provided the patient with a written personalized care plan for preventive services.     Starlyn Skeans, PA-C   11/03/2015

## 2015-11-03 NOTE — Patient Instructions (Signed)
Please start taking imdur first thing in the morning.  This medication is to help you not have chest pains.  It is a long acting version of nitroglycerine.  Please go straight to the Emergency Room if you have severe chest pain and taking 2 nitroglycerin does not relieve the pain.  Please start taking miralax once daily mixed in with any liquid to help with having good bowel movements.    Please start taking remeron at bedtime to help you sleep and to also help stimulate your appetite.

## 2015-11-04 NOTE — Addendum Note (Signed)
Addended by: Staci Carver A on: 11/04/2015 06:47 PM   Modules accepted: Orders

## 2015-11-09 ENCOUNTER — Encounter: Payer: Self-pay | Admitting: *Deleted

## 2015-11-09 ENCOUNTER — Ambulatory Visit (INDEPENDENT_AMBULATORY_CARE_PROVIDER_SITE_OTHER): Payer: Medicare Other | Admitting: Cardiology

## 2015-11-09 ENCOUNTER — Encounter: Payer: Self-pay | Admitting: Cardiology

## 2015-11-09 VITALS — BP 126/70 | HR 90 | Ht 61.0 in | Wt 144.8 lb

## 2015-11-09 DIAGNOSIS — I200 Unstable angina: Secondary | ICD-10-CM

## 2015-11-09 DIAGNOSIS — E7800 Pure hypercholesterolemia, unspecified: Secondary | ICD-10-CM | POA: Diagnosis not present

## 2015-11-09 DIAGNOSIS — I10 Essential (primary) hypertension: Secondary | ICD-10-CM

## 2015-11-09 DIAGNOSIS — R9431 Abnormal electrocardiogram [ECG] [EKG]: Secondary | ICD-10-CM | POA: Diagnosis not present

## 2015-11-09 MED ORDER — ASPIRIN EC 81 MG PO TBEC: 81.0000 mg | DELAYED_RELEASE_TABLET | Freq: Every day | ORAL | 3 refills | Status: DC

## 2015-11-09 NOTE — Patient Instructions (Addendum)
Medication Instructions:  Please start ASA 81 mg a day. The current medical regimen is effective;  continue present plan and medications.  Testing/Procedures: Your physician has requested that you have a cardiac catheterization. Cardiac catheterization is used to diagnose and/or treat various heart conditions. Doctors may recommend this procedure for a number of different reasons. The most common reason is to evaluate chest pain. Chest pain can be a symptom of coronary artery disease (CAD), and cardiac catheterization can show whether plaque is narrowing or blocking your heart's arteries. This procedure is also used to evaluate the valves, as well as measure the blood flow and oxygen levels in different parts of your heart. For further information please visit [email or web address]. Please follow instruction sheet, as given.  Follow-Up: Follow up after your cardiac cath.   If you need a refill on your cardiac medications before your next appointment, please call your pharmacy.  Thank you for choosing [facility]!!

## 2015-11-09 NOTE — Progress Notes (Signed)
Cardiology Office Note    Date:  11/09/2015   ID:  [name], DOB [birth year], MRN [id number]  PCP:  [name] Richards, MD  Cardiologist:   Candee Furbish, MD     History of Present Illness:  [name] [name] is a 82 y.o. female here for evaluation of unstable angina. She was seen on 11/03/2015 for recent chest pain, vomiting, nausea. This occurred on 3 separate occasions. Last one was about a day and a half ago. On October 24 she ended up having an EKG showing deeply inverted T waves quite concerning for ischemia. She had troponin drawn on that day which was normal. Unfortunately about 6 months ago she lost both her daughter and her brother. After this episode, she became quite depressed and actually stopped taking many of her medications.   She has chest pain at home. Took 4 nitroglycerin in a single day. She did not go to the hospital. Her son tried to convince her.  I reviewed her EKG which is quite concerning showing deeply inverted T waves in the anterior precordial leads. These are changes from her prior EKG in 2015. Personally viewed.  About a day and a half ago she did have to take another nitroglycerin. She admits that her memory is not as good as it used to be.     Past Medical History:  Diagnosis Date  . Anemia   . Cataract   . Colon polyp 2009    TUBULAR ADENOMA  . Diabetes ([place])   . Diverticulosis of colon (without mention of hemorrhage) 2009  . Esophagitis 2009  . Gastritis 2009  . GERD (gastroesophageal reflux disease) 2009  . Hiatal hernia 2009  . Hyperlipemia   . Hypertension   . Thyroid disease    hypothyroid    Past Surgical History:  Procedure Laterality Date  . BREAST BIOPSY Left 1999   neg  . COLONOSCOPY  05/2012   Dr. Sharlett Iles  . DILATION AND CURETTAGE OF UTERUS  1984    Current Medications: Outpatient Medications Prior to Visit  Medication Sig Dispense Refill  . atenolol (TENORMIN) 100 MG tablet Take 100 mg by mouth daily.    Marland Kitchen  atorvastatin (LIPITOR) 80 MG tablet TAKE 1 TABLET DAILY FOR CHOLESTEROL 90 tablet 1  . isosorbide mononitrate (IMDUR) 30 MG 24 hr tablet Take 1 tablet (30 mg total) by mouth daily. 30 tablet 11  . metFORMIN (GLUCOPHAGE-XR) 500 MG 24 hr tablet Take 2 tablets (1,000 mg total) by mouth 2 (two) times daily. 360 tablet 1  . mirtazapine (REMERON) 15 MG tablet Take 1 tablet (15 mg total) by mouth at bedtime. 30 tablet 2  . nitroGLYCERIN (NITROSTAT) 0.4 MG SL tablet Dissolve 1 tablet under tongue every 3 to 5 minutes if needed for ChestPain 50 tablet 12  . polyethylene glycol (MIRALAX / GLYCOLAX) packet Take 17 g by mouth daily. 100 each 0  . verapamil (CALAN-SR) 240 MG CR tablet TAKE 1 TABLET AT BEDTIME 90 tablet 1   No facility-administered medications prior to visit.      Allergies:   Actos [pioglitazone]; Bee venom; and Persantine [dipyridamole]   Social History   Social History  . Marital status: Married    Spouse name: N/A  . Number of children: 3  . Years of education: N/A   Social History Main Topics  . Smoking status: Never Smoker  . Smokeless tobacco: Current User    Types: Snuff  . Alcohol use No  . Drug use:  No  . Sexual activity: Not Asked   Other Topics Concern  . None   Social History Narrative  . None     Family History:  The patient's family history includes Breast cancer in her maternal aunt; Diabetes in her father; Lung cancer in her brother; Prostate cancer in her brother; Stroke in her mother; Throat cancer in her maternal uncle.   ROS:   Please see the history of present illness.    ROS All other systems reviewed and are negative.   PHYSICAL EXAM:   VS:  BP 126/70   Pulse 90   Ht 5\' 1"  (1.549 m)   Wt 144 lb 12.8 oz (65.7 kg)   LMP  (LMP Unknown)   BMI 27.36 kg/m    GEN: Well nourished, well developed, in no acute distress Slightly disheveled HEENT: normal  Neck: no JVD, carotid bruits, or masses Cardiac: RRR;  1/6 systolic murmur heard at apex,  rubs, or gallops,no edema  Respiratory:  clear to auscultation bilaterally, normal work of breathing GI: soft, nontender, nondistended, + BS MS: no deformity or atrophy  Skin: warm and dry, no rash Neuro:  Alert and Oriented x 3, Strength and sensation are intact Psych: euthymic mood, full affect  Wt Readings from Last 3 Encounters:  11/09/15 144 lb 12.8 oz (65.7 kg)  11/03/15 145 lb (65.8 kg)  10/31/15 142 lb 12.8 oz (64.8 kg)      Studies/Labs Reviewed:   EKG:  EKG is not ordered today.  Prior EKGs as described above  Recent Labs: 10/31/2015: ALT 12; BUN 8; Creat 0.80; Hemoglobin 12.8; Magnesium 1.7; Platelets 212; Potassium 3.4; Sodium 141; TSH 0.77   Lipid Panel    Component Value Date/Time   CHOL 130 10/31/2015 1134   TRIG 109 10/31/2015 1134   HDL 52 10/31/2015 1134   CHOLHDL 2.5 10/31/2015 1134   VLDL 22 10/31/2015 1134   LDLCALC 56 10/31/2015 1134    Additional studies/ records that were reviewed today include:  Prior EKGs personally reviewed, medical records, lab work, office notes reviewed    ASSESSMENT:    1. Unstable angina ([place])   2. Abnormal EKG   3. Pure hypercholesterolemia   4. Essential hypertension      PLAN:  In order of problems listed above:  Unstable angina  - Concerning escalating anginal symptoms, taking nitroglycerin, EKG with worrisome changes, deep T-wave inversion anterior leads suggestive of ischemia.  - We will proceed with cardiac catheterization. Metformin will be held. Aspirin will be started. Risks and benefits of procedure have been discussed including stroke, heart attack, death, renal impairment, bleeding. She and her son discussed and she is willing to proceed.  - Currently on atorvastatin 80, Imdur 30.  Abnormal EKG  - Concerning for anterior wall ischemia  Diabetes  - Hold metformin prior to cardiac catheterization   Essential hypertension  - Medications reviewed. Currently well controlled.  Hyperlipidemia  -  Continue with atorvastatin  Medication Adjustments/Labs and Tests Ordered: Current medicines are reviewed at length with the patient today.  Concerns regarding medicines are outlined above.  Medication changes, Labs and Tests ordered today are listed in the Patient Instructions below. Patient Instructions  Medication Instructions:  Please start ASA 81 mg a day. The current medical regimen is effective;  continue present plan and medications.  Labwork: Please have blood work (BMP,CBC and INR)  Testing/Procedures: Your physician has requested that you have a cardiac catheterization. Cardiac catheterization is used to diagnose and/or treat various  heart conditions. Doctors may recommend this procedure for a number of different reasons. The most common reason is to evaluate chest pain. Chest pain can be a symptom of coronary artery disease (CAD), and cardiac catheterization can show whether plaque is narrowing or blocking your heart's arteries. This procedure is also used to evaluate the valves, as well as measure the blood flow and oxygen levels in different parts of your heart. For further information please visit HugeFiesta.[email or web address]. Please follow instruction sheet, as given.  Follow-Up: Follow up after your cardiac cath.   If you need a refill on your cardiac medications before your next appointment, please call your pharmacy.  Thank you for choosing [facility]!!        Signed, Candee Furbish, MD  11/09/2015 5:03 PM    [facility] [address], [place], [place]  [address] Phone: [phone]; Fax: [phone]

## 2015-11-10 ENCOUNTER — Ambulatory Visit (INDEPENDENT_AMBULATORY_CARE_PROVIDER_SITE_OTHER): Payer: Medicare Other | Admitting: Internal Medicine

## 2015-11-10 ENCOUNTER — Encounter: Payer: Self-pay | Admitting: Internal Medicine

## 2015-11-10 VITALS — BP 124/82 | HR 104 | Temp 98.0°F | Resp 16 | Ht 61.0 in | Wt 145.0 lb

## 2015-11-10 DIAGNOSIS — F321 Major depressive disorder, single episode, moderate: Secondary | ICD-10-CM | POA: Diagnosis not present

## 2015-11-10 DIAGNOSIS — I200 Unstable angina: Secondary | ICD-10-CM | POA: Diagnosis not present

## 2015-11-10 DIAGNOSIS — K5900 Constipation, unspecified: Secondary | ICD-10-CM | POA: Diagnosis not present

## 2015-11-10 NOTE — Progress Notes (Signed)
Assessment and Plan:   1. Unstable angina (HCC) -mildly improved with imdur -unconvinced that patient is taking medications correctly and will have son go over and set up pill boxes -patient to have catheterization done next week  2. Moderate single current episode of major depressive disorder ([place]) -cont remeron -try to get set up with meals on wheels -may need to see hospice counselor due to active greiving over loss of her daughter and brother -could be major reason for failure to thrive.  3. Constipation, unspecified constipation type -stop miralax -2 weeks worth of amitiza given at 24 mcg -if no relief can consider lactulose    HPI 82 y.o.female presents for 1 month follow up of. Patient reports that they have been doing better as far as the chest pain.  She had 3 episodes of chest pain but did take 1 NTG.  She reports that she feel like she is a little bit better from last visit.  She is still  Having a lot of constipation despite taking the miralax.  She is not sure whether she even started taking the imdur.  She is sleeping a little bit better, but didn't sleep well last night.  She is not eating a whole lot better per her son's report.  female is not sure whether she is taking their medication.  She is having have memory issues.     Past Medical History:  Diagnosis Date  . Anemia   . Cataract   . Colon polyp 2009    TUBULAR ADENOMA  . Diabetes ([place])   . Diverticulosis of colon (without mention of hemorrhage) 2009  . Esophagitis 2009  . Gastritis 2009  . GERD (gastroesophageal reflux disease) 2009  . Hiatal hernia 2009  . Hyperlipemia   . Hypertension   . Thyroid disease    hypothyroid     Allergies  Allergen Reactions  . Actos [Pioglitazone]   . Bee Venom     Eyes swell, cannot breathe  . Persantine [Dipyridamole]     "went crazy"      Current Outpatient Prescriptions on File Prior to Visit  Medication Sig Dispense Refill  . aspirin EC 81 MG tablet Take  1 tablet (81 mg total) by mouth daily. 90 tablet 3  . atenolol (TENORMIN) 100 MG tablet Take 100 mg by mouth daily.    Marland Kitchen atorvastatin (LIPITOR) 80 MG tablet TAKE 1 TABLET DAILY FOR CHOLESTEROL 90 tablet 1  . isosorbide mononitrate (IMDUR) 30 MG 24 hr tablet Take 1 tablet (30 mg total) by mouth daily. 30 tablet 11  . metFORMIN (GLUCOPHAGE-XR) 500 MG 24 hr tablet Take 2 tablets (1,000 mg total) by mouth 2 (two) times daily. 360 tablet 1  . mirtazapine (REMERON) 15 MG tablet Take 1 tablet (15 mg total) by mouth at bedtime. 30 tablet 2  . nitroGLYCERIN (NITROSTAT) 0.4 MG SL tablet Dissolve 1 tablet under tongue every 3 to 5 minutes if needed for ChestPain 50 tablet 12  . polyethylene glycol (MIRALAX / GLYCOLAX) packet Take 17 g by mouth daily. 100 each 0  . verapamil (CALAN-SR) 240 MG CR tablet TAKE 1 TABLET AT BEDTIME 90 tablet 1   No current facility-administered medications on file prior to visit.     ROS: all negative except above.   Physical Exam: Filed Weights   11/10/15 1144  Weight: 145 lb (65.8 kg)   BP 124/82   Pulse (!) 104   Temp 98 F (36.7 C) (Temporal)   Resp 16  Ht 5\' 1"  (1.549 m)   Wt 145 lb (65.8 kg)   LMP  (LMP Unknown)   BMI 27.40 kg/m  General Appearance: Well developed well nourished, non-toxic appearing in no apparent distress. Eyes: PERRLA, EOMs, conjunctiva w/ no swelling or erythema or discharge Sinuses: No Frontal/maxillary tenderness ENT/Mouth: Ear canals clear without swelling or erythema.  TM's normal bilaterally with no retractions, bulging, or loss of landmarks. Hirsutism of chin and neck improved   Neck: Supple, thyroid normal, no notable JVD  Respiratory: Respiratory effort normal, Clear breath sounds anteriorly and posteriorly bilaterally without rales, rhonchi, wheezing or stridor. No retractions or accessory muscle usage. Cardio: RRR with no soft 2/6 murmur in left 3rd and 4th ICS, no RGs.   Abdomen: Soft, + BS.  Non tender, no guarding, rebound,  hernias, masses.  Musculoskeletal: Full ROM, 5/5 strength, normal gait.  Skin: Warm, dry without rashes  Neuro: Awake and oriented X 3, Cranial nerves intact. Normal muscle tone, no cerebellar symptoms. Sensation intact.  Psych: Depressed affect, marginally communicative Insight and Judgment appropriate.     Starlyn Skeans, PA-C 11:59 AM [facility]

## 2015-11-10 NOTE — Patient Instructions (Signed)
Please start eating some fruits and veggies.  Please drink 4-6 glasses of water a day at the minimum.  Please start taking amitiza once daily to try to help with bowel movements.  If you end up with diarrhea you must call me and let me know.  Please follow the instructions from Dr. Marlou Porch about holding the metformin prior to the heart catherterization on Wednesday of next week to help protect the kidneys.  Start making a weekly pill box to help make sure that you are taking medications properly.  Do not take miralax and amitiza together.  You can try a glass of prune juice daily to help prevent constipation.

## 2015-11-14 ENCOUNTER — Telehealth: Payer: Self-pay | Admitting: Internal Medicine

## 2015-11-14 NOTE — Telephone Encounter (Signed)
Called Buff and gave her the phone number for [employer] [place]. [phone], to inquiry about Meals on Wheels eligibility. Mailed brochure. She seemed happy to receive the information and willing to call.

## 2015-11-15 ENCOUNTER — Inpatient Hospital Stay (HOSPITAL_COMMUNITY): Payer: Medicare Other

## 2015-11-15 ENCOUNTER — Encounter (HOSPITAL_COMMUNITY)
Admission: RE | Disposition: A | Discharge status: Home Health | Payer: Self-pay | Source: Ambulatory Visit | Attending: Thoracic Surgery (Cardiothoracic Vascular Surgery)

## 2015-11-15 ENCOUNTER — Encounter (HOSPITAL_COMMUNITY): Payer: Self-pay | Admitting: *Deleted

## 2015-11-15 ENCOUNTER — Other Ambulatory Visit: Payer: Self-pay | Admitting: *Deleted

## 2015-11-15 ENCOUNTER — Inpatient Hospital Stay (HOSPITAL_COMMUNITY)
Admission: RE | Admit: 2015-11-15 | Discharge: 2015-11-25 | DRG: 234 | Disposition: A | Discharge status: Home Health | Payer: Medicare Other | Source: Ambulatory Visit | Attending: Thoracic Surgery (Cardiothoracic Vascular Surgery) | Admitting: Thoracic Surgery (Cardiothoracic Vascular Surgery)

## 2015-11-15 DIAGNOSIS — Z951 Presence of aortocoronary bypass graft: Secondary | ICD-10-CM

## 2015-11-15 DIAGNOSIS — R000 Tachycardia, unspecified: Secondary | ICD-10-CM | POA: Diagnosis not present

## 2015-11-15 DIAGNOSIS — I129 Hypertensive chronic kidney disease with stage 1 through stage 4 chronic kidney disease, or unspecified chronic kidney disease: Secondary | ICD-10-CM | POA: Diagnosis present

## 2015-11-15 DIAGNOSIS — N182 Chronic kidney disease, stage 2 (mild): Secondary | ICD-10-CM | POA: Diagnosis present

## 2015-11-15 DIAGNOSIS — I700 Atherosclerosis of aorta: Secondary | ICD-10-CM

## 2015-11-15 DIAGNOSIS — E785 Hyperlipidemia, unspecified: Secondary | ICD-10-CM | POA: Diagnosis present

## 2015-11-15 DIAGNOSIS — D6959 Other secondary thrombocytopenia: Secondary | ICD-10-CM | POA: Diagnosis present

## 2015-11-15 DIAGNOSIS — Z86010 Personal history of colonic polyps: Secondary | ICD-10-CM

## 2015-11-15 DIAGNOSIS — I080 Rheumatic disorders of both mitral and aortic valves: Secondary | ICD-10-CM | POA: Diagnosis present

## 2015-11-15 DIAGNOSIS — I2510 Atherosclerotic heart disease of native coronary artery without angina pectoris: Secondary | ICD-10-CM

## 2015-11-15 DIAGNOSIS — I493 Ventricular premature depolarization: Secondary | ICD-10-CM | POA: Diagnosis present

## 2015-11-15 DIAGNOSIS — Z808 Family history of malignant neoplasm of other organs or systems: Secondary | ICD-10-CM

## 2015-11-15 DIAGNOSIS — F329 Major depressive disorder, single episode, unspecified: Secondary | ICD-10-CM | POA: Diagnosis present

## 2015-11-15 DIAGNOSIS — E11649 Type 2 diabetes mellitus with hypoglycemia without coma: Secondary | ICD-10-CM | POA: Diagnosis present

## 2015-11-15 DIAGNOSIS — I200 Unstable angina: Secondary | ICD-10-CM | POA: Diagnosis present

## 2015-11-15 DIAGNOSIS — E1122 Type 2 diabetes mellitus with diabetic chronic kidney disease: Secondary | ICD-10-CM | POA: Diagnosis present

## 2015-11-15 DIAGNOSIS — D72829 Elevated white blood cell count, unspecified: Secondary | ICD-10-CM | POA: Diagnosis present

## 2015-11-15 DIAGNOSIS — M810 Age-related osteoporosis without current pathological fracture: Secondary | ICD-10-CM | POA: Diagnosis present

## 2015-11-15 DIAGNOSIS — K219 Gastro-esophageal reflux disease without esophagitis: Secondary | ICD-10-CM | POA: Diagnosis present

## 2015-11-15 DIAGNOSIS — E039 Hypothyroidism, unspecified: Secondary | ICD-10-CM | POA: Diagnosis present

## 2015-11-15 DIAGNOSIS — I25110 Atherosclerotic heart disease of native coronary artery with unstable angina pectoris: Principal | ICD-10-CM

## 2015-11-15 DIAGNOSIS — Z888 Allergy status to other drugs, medicaments and biological substances status: Secondary | ICD-10-CM

## 2015-11-15 DIAGNOSIS — Z803 Family history of malignant neoplasm of breast: Secondary | ICD-10-CM

## 2015-11-15 DIAGNOSIS — Z01810 Encounter for preprocedural cardiovascular examination: Secondary | ICD-10-CM

## 2015-11-15 DIAGNOSIS — K5730 Diverticulosis of large intestine without perforation or abscess without bleeding: Secondary | ICD-10-CM | POA: Diagnosis present

## 2015-11-15 DIAGNOSIS — Z7984 Long term (current) use of oral hypoglycemic drugs: Secondary | ICD-10-CM

## 2015-11-15 DIAGNOSIS — Z833 Family history of diabetes mellitus: Secondary | ICD-10-CM

## 2015-11-15 DIAGNOSIS — J9811 Atelectasis: Secondary | ICD-10-CM | POA: Diagnosis not present

## 2015-11-15 DIAGNOSIS — Z79899 Other long term (current) drug therapy: Secondary | ICD-10-CM

## 2015-11-15 DIAGNOSIS — D62 Acute posthemorrhagic anemia: Secondary | ICD-10-CM | POA: Diagnosis not present

## 2015-11-15 DIAGNOSIS — I10 Essential (primary) hypertension: Secondary | ICD-10-CM | POA: Diagnosis not present

## 2015-11-15 DIAGNOSIS — E8770 Fluid overload, unspecified: Secondary | ICD-10-CM | POA: Diagnosis present

## 2015-11-15 DIAGNOSIS — Z91030 Bee allergy status: Secondary | ICD-10-CM

## 2015-11-15 DIAGNOSIS — E7800 Pure hypercholesterolemia, unspecified: Secondary | ICD-10-CM | POA: Diagnosis present

## 2015-11-15 DIAGNOSIS — Z7982 Long term (current) use of aspirin: Secondary | ICD-10-CM

## 2015-11-15 HISTORY — DX: Depression, unspecified: F32.A

## 2015-11-15 HISTORY — PX: CARDIAC CATHETERIZATION: SHX172

## 2015-11-15 HISTORY — DX: Atherosclerotic heart disease of native coronary artery without angina pectoris: I25.10

## 2015-11-15 HISTORY — DX: Angina pectoris, unspecified: I20.9

## 2015-11-15 HISTORY — DX: Major depressive disorder, single episode, unspecified: F32.9

## 2015-11-15 LAB — GLUCOSE, CAPILLARY
Glucose-Capillary: 170 mg/dL — ABNORMAL HIGH (ref 65–99)
Glucose-Capillary: 116 mg/dL — ABNORMAL HIGH (ref 65–99)
Glucose-Capillary: 129 mg/dL — ABNORMAL HIGH (ref 65–99)
Glucose-Capillary: 159 mg/dL — ABNORMAL HIGH (ref 65–99)
Glucose-Capillary: 116 mg/dL — ABNORMAL HIGH (ref 65–99)

## 2015-11-15 LAB — ABO/RH: ABO/RH(D): A POS

## 2015-11-15 LAB — URINALYSIS, ROUTINE W REFLEX MICROSCOPIC
Bilirubin Urine: NEGATIVE
Glucose, UA: NEGATIVE mg/dL
Hgb urine dipstick: NEGATIVE
Ketones, ur: NEGATIVE mg/dL
Leukocytes, UA: NEGATIVE
Nitrite: NEGATIVE
Protein, ur: NEGATIVE mg/dL
Specific Gravity, Urine: 1.019 (ref 1.005–1.030)
pH: 6.5 (ref 5.0–8.0)

## 2015-11-15 LAB — PROTIME-INR
INR: 1.01
Prothrombin Time: 13.3 seconds (ref 11.4–15.2)

## 2015-11-15 LAB — POTASSIUM: Potassium: 3.3 mmol/L — ABNORMAL LOW (ref 3.5–5.1)

## 2015-11-15 SURGERY — LEFT HEART CATH AND CORONARY ANGIOGRAPHY
Anesthesia: LOCAL

## 2015-11-15 MED ORDER — POTASSIUM CHLORIDE CRYS ER 20 MEQ PO TBCR
40.0000 meq | EXTENDED_RELEASE_TABLET | Freq: Once | ORAL | Status: AC
  Administered 2015-11-15: 40 meq via ORAL
  Filled 2015-11-15: qty 2

## 2015-11-15 MED ORDER — IOPAMIDOL (ISOVUE-370) INJECTION 76%
INTRAVENOUS | Status: DC | PRN
Start: 2015-11-15 — End: 2015-11-15
  Administered 2015-11-15: 60 mL via INTRA_ARTERIAL

## 2015-11-15 MED ORDER — NICOTINE 7 MG/24HR TD PT24
7.0000 mg | MEDICATED_PATCH | Freq: Every day | TRANSDERMAL | Status: DC
  Administered 2015-11-15 – 2015-11-20 (×5): 7 mg via TRANSDERMAL
  Filled 2015-11-15 (×9): qty 1

## 2015-11-15 MED ORDER — DEXMEDETOMIDINE HCL IN NACL 400 MCG/100ML IV SOLN
0.1000 ug/kg/h | INTRAVENOUS | Status: AC
  Administered 2015-11-16: .5 ug/kg/h via INTRAVENOUS
  Filled 2015-11-15: qty 100

## 2015-11-15 MED ORDER — POTASSIUM CHLORIDE 10 MEQ/100ML IV SOLN
10.0000 meq | Freq: Once | INTRAVENOUS | Status: DC
  Filled 2015-11-15: qty 100

## 2015-11-15 MED ORDER — TEMAZEPAM 15 MG PO CAPS: 15.0000 mg | ORAL_CAPSULE | Freq: Once | ORAL | Status: DC | PRN

## 2015-11-15 MED ORDER — SODIUM CHLORIDE 0.9 % IV SOLN: 250.0000 mL | INTRAVENOUS | Status: DC | PRN

## 2015-11-15 MED ORDER — MIDAZOLAM HCL 2 MG/2ML IJ SOLN
INTRAMUSCULAR | Status: AC
  Filled 2015-11-15: qty 2

## 2015-11-15 MED ORDER — SODIUM CHLORIDE 0.9 % WEIGHT BASED INFUSION: 1.0000 mL/kg/h | INTRAVENOUS | Status: DC

## 2015-11-15 MED ORDER — PLASMA-LYTE 148 IV SOLN
INTRAVENOUS | Status: AC
  Administered 2015-11-16: 500 mL
  Filled 2015-11-15: qty 2.5

## 2015-11-15 MED ORDER — DEXTROSE 5 % IV SOLN
1.5000 g | INTRAVENOUS | Status: AC
  Administered 2015-11-16: 1.5 g via INTRAVENOUS
  Filled 2015-11-15: qty 1.5

## 2015-11-15 MED ORDER — DOPAMINE-DEXTROSE 3.2-5 MG/ML-% IV SOLN
0.0000 ug/kg/min | INTRAVENOUS | Status: AC
Start: 2015-11-16 — End: 2015-11-16
  Administered 2015-11-16: 3 ug/kg/min via INTRAVENOUS
  Filled 2015-11-15: qty 250

## 2015-11-15 MED ORDER — CHLORHEXIDINE GLUCONATE 0.12 % MT SOLN
15.0000 mL | Freq: Once | OROMUCOSAL | Status: AC
  Administered 2015-11-16: 15 mL via OROMUCOSAL
  Filled 2015-11-15: qty 15

## 2015-11-15 MED ORDER — DIAZEPAM 2 MG PO TABS
2.0000 mg | ORAL_TABLET | Freq: Once | ORAL | Status: AC
  Administered 2015-11-16: 2 mg via ORAL
  Filled 2015-11-15: qty 1

## 2015-11-15 MED ORDER — NITROGLYCERIN IN D5W 200-5 MCG/ML-% IV SOLN
2.0000 ug/min | INTRAVENOUS | Status: AC
Start: 2015-11-16 — End: 2015-11-16
  Administered 2015-11-16: 5 ug/min via INTRAVENOUS
  Filled 2015-11-15: qty 250

## 2015-11-15 MED ORDER — FENTANYL CITRATE (PF) 100 MCG/2ML IJ SOLN
INTRAMUSCULAR | Status: AC
  Filled 2015-11-15: qty 2

## 2015-11-15 MED ORDER — SODIUM CHLORIDE 0.9 % IV SOLN
INTRAVENOUS | Status: AC
  Administered 2015-11-16: 1.5 [IU]/h via INTRAVENOUS
  Filled 2015-11-15: qty 2.5

## 2015-11-15 MED ORDER — HEPARIN (PORCINE) IN NACL 2-0.9 UNIT/ML-% IJ SOLN
INTRAMUSCULAR | Status: AC
  Filled 2015-11-15: qty 1500

## 2015-11-15 MED ORDER — ASPIRIN 81 MG PO CHEW
CHEWABLE_TABLET | ORAL | Status: AC
  Filled 2015-11-15: qty 1

## 2015-11-15 MED ORDER — IOPAMIDOL (ISOVUE-370) INJECTION 76%
INTRAVENOUS | Status: AC
  Filled 2015-11-15: qty 100

## 2015-11-15 MED ORDER — TRANEXAMIC ACID 1000 MG/10ML IV SOLN
1.5000 mg/kg/h | INTRAVENOUS | Status: AC
  Administered 2015-11-16: 1.5 mg/kg/h via INTRAVENOUS
  Filled 2015-11-15: qty 25

## 2015-11-15 MED ORDER — SODIUM CHLORIDE 0.9% FLUSH: 3.0000 mL | Freq: Two times a day (BID) | INTRAVENOUS | Status: DC

## 2015-11-15 MED ORDER — DIAZEPAM 5 MG PO TABS: 5.0000 mg | ORAL_TABLET | ORAL | Status: DC | PRN

## 2015-11-15 MED ORDER — POTASSIUM CHLORIDE 2 MEQ/ML IV SOLN
80.0000 meq | INTRAVENOUS | Status: DC
  Filled 2015-11-15: qty 40

## 2015-11-15 MED ORDER — PHENYLEPHRINE HCL 10 MG/ML IJ SOLN
30.0000 ug/min | INTRAVENOUS | Status: DC
  Filled 2015-11-15: qty 2

## 2015-11-15 MED ORDER — VANCOMYCIN HCL 10 G IV SOLR
1250.0000 mg | INTRAVENOUS | Status: AC
  Administered 2015-11-16: 1250 mg via INTRAVENOUS
  Filled 2015-11-15: qty 1250

## 2015-11-15 MED ORDER — SODIUM CHLORIDE 0.9% FLUSH: 3.0000 mL | INTRAVENOUS | Status: DC | PRN

## 2015-11-15 MED ORDER — SODIUM CHLORIDE 0.9 % WEIGHT BASED INFUSION
3.0000 mL/kg/h | INTRAVENOUS | Status: DC
  Administered 2015-11-15: 3 mL/kg/h via INTRAVENOUS

## 2015-11-15 MED ORDER — ATORVASTATIN CALCIUM 80 MG PO TABS
80.0000 mg | ORAL_TABLET | Freq: Every day | ORAL | Status: DC
  Administered 2015-11-15 – 2015-11-24 (×8): 80 mg via ORAL
  Filled 2015-11-15 (×9): qty 1

## 2015-11-15 MED ORDER — MAGNESIUM SULFATE 50 % IJ SOLN
40.0000 meq | INTRAMUSCULAR | Status: DC
  Filled 2015-11-15: qty 10

## 2015-11-15 MED ORDER — BISACODYL 5 MG PO TBEC
5.0000 mg | DELAYED_RELEASE_TABLET | Freq: Once | ORAL | Status: AC
  Administered 2015-11-15: 5 mg via ORAL
  Filled 2015-11-15: qty 1

## 2015-11-15 MED ORDER — METOPROLOL TARTRATE 12.5 MG HALF TABLET
12.5000 mg | ORAL_TABLET | Freq: Once | ORAL | Status: AC
  Administered 2015-11-16: 12.5 mg via ORAL
  Filled 2015-11-15: qty 1

## 2015-11-15 MED ORDER — FENTANYL CITRATE (PF) 100 MCG/2ML IJ SOLN
INTRAMUSCULAR | Status: DC | PRN
  Administered 2015-11-15: 25 ug via INTRAVENOUS

## 2015-11-15 MED ORDER — ASPIRIN 81 MG PO CHEW
81.0000 mg | CHEWABLE_TABLET | ORAL | Status: AC
  Administered 2015-11-15: 81 mg via ORAL

## 2015-11-15 MED ORDER — TRANEXAMIC ACID (OHS) PUMP PRIME SOLUTION
2.0000 mg/kg | INTRAVENOUS | Status: DC
  Filled 2015-11-15: qty 1.32

## 2015-11-15 MED ORDER — SODIUM CHLORIDE 0.9 % IV SOLN
INTRAVENOUS | Status: DC
  Filled 2015-11-15: qty 30

## 2015-11-15 MED ORDER — ONDANSETRON HCL 4 MG/2ML IJ SOLN: 4.0000 mg | Freq: Four times a day (QID) | INTRAMUSCULAR | Status: DC | PRN

## 2015-11-15 MED ORDER — SODIUM CHLORIDE 0.9 % IV SOLN
250.0000 mL | INTRAVENOUS | Status: DC | PRN
Start: 2015-11-15 — End: 2015-11-16

## 2015-11-15 MED ORDER — ASPIRIN 81 MG PO CHEW: 81.0000 mg | CHEWABLE_TABLET | Freq: Every day | ORAL | Status: DC

## 2015-11-15 MED ORDER — DEXTROSE 5 % IV SOLN
0.0000 ug/min | INTRAVENOUS | Status: DC
  Filled 2015-11-15: qty 4

## 2015-11-15 MED ORDER — LIDOCAINE HCL (PF) 1 % IJ SOLN
INTRAMUSCULAR | Status: AC
  Filled 2015-11-15: qty 30

## 2015-11-15 MED ORDER — CHLORHEXIDINE GLUCONATE CLOTH 2 % EX PADS: 6.0000 | MEDICATED_PAD | Freq: Once | CUTANEOUS | Status: DC

## 2015-11-15 MED ORDER — MIDAZOLAM HCL 2 MG/2ML IJ SOLN
INTRAMUSCULAR | Status: DC | PRN
  Administered 2015-11-15: 1 mg via INTRAVENOUS

## 2015-11-15 MED ORDER — HEPARIN (PORCINE) IN NACL 2-0.9 UNIT/ML-% IJ SOLN
INTRAMUSCULAR | Status: DC | PRN
  Administered 2015-11-15: 1500 mL

## 2015-11-15 MED ORDER — HEPARIN (PORCINE) IN NACL 100-0.45 UNIT/ML-% IJ SOLN
950.0000 [IU]/h | INTRAMUSCULAR | Status: DC
  Administered 2015-11-15: 950 [IU]/h via INTRAVENOUS
  Filled 2015-11-15: qty 250

## 2015-11-15 MED ORDER — ACETAMINOPHEN 325 MG PO TABS: 650.0000 mg | ORAL_TABLET | ORAL | Status: DC | PRN

## 2015-11-15 MED ORDER — SODIUM CHLORIDE 0.9 % IV SOLN: INTRAVENOUS | Status: DC

## 2015-11-15 MED ORDER — LIDOCAINE HCL (PF) 1 % IJ SOLN
INTRAMUSCULAR | Status: DC | PRN
  Administered 2015-11-15: 15 mL

## 2015-11-15 MED ORDER — TRANEXAMIC ACID (OHS) BOLUS VIA INFUSION
15.0000 mg/kg | INTRAVENOUS | Status: AC
  Administered 2015-11-16: 987 mg via INTRAVENOUS
  Filled 2015-11-15: qty 987

## 2015-11-15 MED ORDER — DEXTROSE 5 % IV SOLN
750.0000 mg | INTRAVENOUS | Status: DC
  Filled 2015-11-15: qty 750

## 2015-11-15 MED ORDER — CHLORHEXIDINE GLUCONATE CLOTH 2 % EX PADS
6.0000 | MEDICATED_PAD | Freq: Once | CUTANEOUS | Status: AC
  Administered 2015-11-15: 6 via TOPICAL

## 2015-11-15 SURGICAL SUPPLY — 8 items
CATH INFINITI 5FR MULTPACK ANG (CATHETERS) ×1 IMPLANT
KIT HEART LEFT (KITS) ×2 IMPLANT
PACK CARDIAC CATHETERIZATION (CUSTOM PROCEDURE TRAY) ×2 IMPLANT
SHEATH PINNACLE 5F 10CM (SHEATH) ×1 IMPLANT
SYR MEDRAD MARK V 150ML (SYRINGE) ×2 IMPLANT
TRANSDUCER W/STOPCOCK (MISCELLANEOUS) ×2 IMPLANT
TUBING CIL FLEX 10 FLL-RA (TUBING) ×2 IMPLANT
WIRE EMERALD ST .035X150CM (WIRE) ×1 IMPLANT

## 2015-11-15 NOTE — Consult Note (Signed)
[address]       [place] [address]             [id number]        Kalia F Chausse [facility] Medical Record [id number] Date of Birth: 07-12-1931  Referring: No ref. provider found Primary Care: Alesia Richards, MD  Chief Complaint:  Chest pain   History of Present Illness:     Ms. Adeli Gonnering is an 82 year old female with a past medical history positive for anemia, depression, CKD, Diabetes mellitus, GERD, hypothyroidism, and hypertension who presented to  for an evaluation of her unstable angina. She was seen last month in her PCP office for complaints of chest pain, vomiting, and nausea. Her troponin was normal this admission. An EKG was done showing concern for ischemia. She has been treating her chest pain at home with nitroglycerin, up to 4 a day for a few months. She underwent cardiac catherization today which showed severe multivessel disease. We are consulted to work her up for revascularization with CABG.  No current chest pain.   Of note the patient lives with her husband who is going blind per the son. The son lives about 45 minutes away. She can do all ADLs but is otherwise sedentary.    Current Activity/ Functional Status: ADL's: independent for UE/LE dressing, toileting, brush teeth, and washface with no assistive devices. , can do all activities of daily living, otherwise sedentary    Zubrod Score: At the time of surgery this patient's most appropriate activity status/level should be described as: []     0    Normal activity, no symptoms [x]     1    Restricted in physical strenuous activity but ambulatory, able to do out light work []     2    Ambulatory and capable of self care, unable to do work activities, up and about                 more than 50%  Of the time                            []     3    Only limited self care, in bed greater than 50% of waking hours []     4    Completely disabled, no self care, confined to bed or  chair []     5    Moribund  Past Medical History:  Diagnosis Date  . Anemia   . Anginal pain ([place])   . Cataract   . CKD stage 2 due to type 2 diabetes mellitus ([place])   . Colon polyp 2009    TUBULAR ADENOMA  . Coronary artery disease   . Depression   . Diabetes ([place])   . Diverticulosis of colon (without mention of hemorrhage) 2009  . Esophagitis 2009  . Gastritis 2009  . GERD (gastroesophageal reflux disease) 2009  . Hiatal hernia 2009  . Hyperlipemia   . Hypertension   . Thyroid disease    hypothyroid    Past Surgical History:  Procedure Laterality Date  . BREAST BIOPSY Left 1999   neg  . CARDIAC CATHETERIZATION N/A 11/15/2015   Procedure: Left Heart Cath and Coronary Angiography;  Surgeon: Troy Sine, MD;  Location: [facility];  Service: Cardiovascular;  Laterality: N/A;  . COLONOSCOPY  05/2012   Dr. Sharlett Iles  . DILATION AND CURETTAGE OF UTERUS  1984    History  Smoking Status  . Never Smoker  Smokeless Tobacco  . Current User  . Types: Snuff    History  Alcohol Use No    Social History   Social History  . Marital status: Married    Spouse name: N/A  . Number of children: 3  . Years of education: N/A   Occupational History  . Not on file.   Social History Main Topics  . Smoking status: Never Smoker  . Smokeless tobacco: Current User    Types: Snuff  . Alcohol use No  . Drug use: No  . Sexual activity: Not on file   Other Topics Concern  . Not on file   Social History Narrative  . No narrative on file    Allergies  Allergen Reactions  . Bee Venom Anaphylaxis and Other (See Comments)    Eyes swell, cannot breathe  . Persantine [Dipyridamole] Other (See Comments)    "went crazy"  . Actos [Pioglitazone] Other (See Comments)    UNSPECIFIED REACTION     Current Facility-Administered Medications  Medication Dose Route Frequency Provider Last Rate Last Dose  . 0.9 %  sodium chloride infusion  250 mL Intravenous PRN Troy Sine, MD      . 0.9 %  sodium chloride infusion   Intravenous Continuous Troy Sine, MD 125 mL/hr at 11/15/15 1113 500 mL at 11/15/15 1113  . acetaminophen (TYLENOL) tablet 650 mg  650 mg Oral Q4H PRN Troy Sine, MD      . aspirin 81 MG chewable tablet           . [START ON 11/16/2015] aspirin chewable tablet 81 mg  81 mg Oral Daily Troy Sine, MD      . atorvastatin (LIPITOR) tablet 80 mg  80 mg Oral q1800 Troy Sine, MD      . diazepam (VALIUM) tablet 5 mg  5 mg Oral Q4H PRN Troy Sine, MD      . heparin ADULT infusion 100 units/mL (25000 units/250mL sodium chloride 0.45%)  950 Units/hr Intravenous Continuous Kendra P Hiatt, RPH      . ondansetron (ZOFRAN) injection 4 mg  4 mg Intravenous Q6H PRN Troy Sine, MD      . sodium chloride flush (NS) 0.9 % injection 3 mL  3 mL Intravenous Q12H Troy Sine, MD      . sodium chloride flush (NS) 0.9 % injection 3 mL  3 mL Intravenous PRN Troy Sine, MD        Prescriptions Prior to Admission  Medication Sig Dispense Refill Last Dose  . aspirin EC 81 MG tablet Take 1 tablet (81 mg total) by mouth daily. 90 tablet 3 11/14/2015 at Unknown time  . atenolol (TENORMIN) 100 MG tablet Take 100 mg by mouth daily.   11/14/2015 at Unknown  . atorvastatin (LIPITOR) 80 MG tablet TAKE 1 TABLET DAILY FOR CHOLESTEROL (Patient taking differently: TAKE 80 MG DAILY FOR CHOLESTEROL) 90 tablet 1 11/14/2015 at Unknown time  . isosorbide mononitrate (IMDUR) 30 MG 24 hr tablet Take 1 tablet (30 mg total) by mouth daily. 30 tablet 11 11/14/2015 at Unknown time  . lubiprostone (AMITIZA) 24 MCG capsule Take 24 mcg by mouth daily.   11/14/2015 at Unknown time  . metFORMIN (GLUCOPHAGE-XR) 500 MG 24 hr tablet Take 2 tablets (1,000 mg total) by mouth 2 (two) times daily. 360 tablet 1 Past Week at Unknown time  .  mirtazapine (REMERON) 15 MG tablet Take 1 tablet (15 mg total) by mouth at bedtime. 30 tablet 2 Unknown at Unknown  . nitroGLYCERIN (NITROSTAT)  0.4 MG SL tablet Dissolve 1 tablet under tongue every 3 to 5 minutes if needed for ChestPain 50 tablet 12 Past Week at Unknown time  . verapamil (CALAN-SR) 240 MG CR tablet TAKE 1 TABLET AT BEDTIME (Patient taking differently: TAKE 240 MG BY MOUTH AT BEDTIME) 90 tablet 1 11/14/2015 at Unknown time  . polyethylene glycol (MIRALAX / GLYCOLAX) packet Take 17 g by mouth daily. (Patient not taking: Reported on 11/15/2015) 100 each 0 Not Taking at Unknown time    Family History  Problem Relation Age of Onset  . Diabetes Father   . Stroke Mother   . Breast cancer Maternal Aunt   . Throat cancer Maternal Uncle   . Lung cancer Brother   . Prostate cancer Brother   . Rectal cancer Neg Hx   . Stomach cancer Neg Hx   . Colon cancer Neg Hx   . Esophageal cancer Neg Hx      Review of Systems:  Pertinent items are noted in HPI.     Cardiac Review of Systems: Y or N  Chest Pain [ x   ]  Resting SOB [   ] Exertional SOB  [  ]  Orthopnea [  ]   Pedal Edema [   ]    Palpitations [  ] Syncope  [  ]   Presyncope [   ]  General Review of Systems: [Y] = yes [  ]=no Constitional: recent weight change [  ]; anorexia [  ]; fatigue [ x ]; nausea [ x ]; night sweats [  ]; fever [  ]; or chills [  ]                                                               Dental: poor dentition[  ]; Last Dentist visit:   Eye : blurred vision [  ]; diplopia [   ]; vision changes [  ];  Amaurosis fugax[  ]; Resp: cough [  ];  wheezing[  ];  hemoptysis[  ]; shortness of breath[  ]; paroxysmal nocturnal dyspnea[  ]; dyspnea on exertion[  ]; or orthopnea[  ];  GI:  gallstones[  ], vomiting[ x ];  dysphagia[  ]; melena[  ];  hematochezia [  ]; heartburn[  ];   Hx of  Colonoscopy[  ]; GU: kidney stones [  ]; hematuria[  ];   dysuria [  ];  nocturia[  ];  history of     obstruction [  ]; urinary frequency [  ]             Skin: rash, swelling[  ];, hair loss[  ];  peripheral edema[  ];  or itching[  ]; Musculosketetal: myalgias[  ];   joint swelling[  ];  joint erythema[  ];  joint pain[  ];  back pain[  ];  Heme/Lymph: bruising[  ];  bleeding[  ];  anemia[  ];  Neuro: TIA[  ];  headaches[  ];  stroke[  ];  vertigo[  ];  seizures[  ];   paresthesias[  ];  difficulty walking[  ];  Psych:depression[ x  ]; anxiety[  ];  Endocrine: diabetes[ x ];  thyroid dysfunction[ x ];  Immunizations: Flu [  ]; Pneumococcal[  ];  Other:  Physical Exam: BP (!) 146/61   Pulse 71   Temp 97.8 F (36.6 C) (Oral)   Resp 18   Ht 5\' 1"  (1.549 m)   Wt 145 lb (65.8 kg)   LMP  (LMP Unknown)   SpO2 97%   BMI 27.40 kg/m    General appearance: sleepy, no distress Resp: clear to auscultation bilaterally Cardio: regular rate and rhythm, S1, S2 normal, no murmur, click, rub or gallop GI: soft, non-tender; bowel sounds normal; no masses,  no organomegaly Extremities: extremities normal, atraumatic, no cyanosis or edema  Diagnostic Studies & Laboratory data:  Cardiac Cath [id number]:  LV end diastolic pressure is normal.  There is moderate left ventricular systolic dysfunction.  Ramus lesion, 80 %stenosed.  Ost Ramus lesion, 70 %stenosed.  Ost LAD lesion, 80 %stenosed.  Prox LAD to Mid LAD lesion, 50 %stenosed.  Mid LAD lesion, 95 %stenosed.  Dist LAD lesion, 30 %stenosed.  Ost Cx lesion, 85 %stenosed.  Prox Cx lesion, 95 %stenosed.  Ost RCA lesion, 95 %stenosed.  Prox RCA lesion, 95 %stenosed.  Mid RCA lesion, 60 %stenosed.   Moderate LV dysfunction with an ejection fraction of 35-40% with a focal area of upper mid anterolateral hypocontractility and mid to basal inferior hypokinesis.  Severely calcified coronary arteries with severe multivessel CAD with 80% ostial LAD stenosis followed by diffuse narrowing of 50%.  Prior to a 95% proximal to mid stenosis with 30% mid distal stenosis; 70% ostial 80% proximal ramus intermediate stenosis; 80% proximal followed by 95% left circumflex stenosis; an ostial 95% RCA stenosis  followed by proximal 95% and mid 60% stenoses.  RECOMMENDATION: Surgical consultation for CABG revascularization surgery.    No recent Echocardiogram to view.    Recent Radiology Findings:   No results found.    Recent Lab Findings: Lab Results  Component Value Date   WBC 5.6 10/31/2015   HGB 12.8 10/31/2015   HCT 36.7 10/31/2015   PLT 212 10/31/2015   GLUCOSE 171 (H) 10/31/2015   CHOL 130 10/31/2015   TRIG 109 10/31/2015   HDL 52 10/31/2015   LDLCALC 56 10/31/2015   ALT 12 10/31/2015   AST 14 10/31/2015   NA 141 10/31/2015   K 3.3 (L) 11/15/2015   CL 106 10/31/2015   CREATININE 0.80 10/31/2015   BUN 8 10/31/2015   CO2 22 10/31/2015   TSH 0.77 10/31/2015   INR 1.01 11/15/2015   HGBA1C 9.1 (H) 10/31/2015      Assessment / Plan:   Surgeon to review cardiac cath report. Possible revascularization tomorrow with Dr. Roxan Hockey.   I  spent 30 minutes counseling the patient face to face and 50% or more the  time was spent in counseling and coordination of care. The total time spent in the appointment was 60 minutes.   Nicholes Rough, PA-C 11/15/2015 1:57 PM   Patient seen and examined, agree with above  64 yo woman who presents with unstable angina. At cath she has severe 3 vessel CAD with moderate LV dysfunction (EF35%). CABG is indicated for survival benefit and relief of symptoms. She also has bilateral carotid bruits but no hemodynamically significant ICA stenosis on duplex.  I discussed with the patient the general nature of the procedure, the need for general anesthesia, the use of cardiopulmonary bypass, and the incisions to be used  with Mrs. Saelee and her son. We discussed the expected hospital stay, overall recovery and short and long term outcomes. I informed them of the indications, risks, benefits and alternatives. They understand the risks include, but are not limited to death, stroke, MI, DVT/PE, bleeding, possible need for transfusion, infections, cardiac  arrhythmias, as well as other organ system dysfunction including respiratory, renal, or GI complications. She is at increased risk for stroke due to age, aortic calcification. Will get CT chest (no contrast) to assess ascending aorta and make sure there is sufficient room for cannulation, proximals, etc.  She understands and accepts the risks and agrees to proceed. Her son is also in agreement.  Plan  CT chest tonight  CABG in AM  Tahsin Benyo C. Roxan Hockey, MD [facility] [phone]

## 2015-11-15 NOTE — Anesthesia Preprocedure Evaluation (Addendum)
Anesthesia Evaluation  Patient identified by MRN, date of birth, ID band Patient awake    Reviewed: Allergy & Precautions, NPO status , Patient's Chart, lab work & pertinent test results, reviewed documented beta blocker date and time   History of Anesthesia Complications Negative for: history of anesthetic complications  Airway Mallampati: II  TM Distance: >3 FB Neck ROM: Full    Dental  (+) Edentulous Upper, Edentulous Lower, Dental Advisory Given   Pulmonary neg pulmonary ROS,    breath sounds clear to auscultation       Cardiovascular hypertension, Pt. on medications and Pt. on home beta blockers + angina + CAD   Rhythm:Regular Rate:Normal  Cath: 3v ASCADz with EF 35-40%   Neuro/Psych Depression negative neurological ROS     GI/Hepatic Neg liver ROS, GERD  Controlled,  Endo/Other  diabetes (glu 137), Oral Hypoglycemic AgentsHypothyroidism   Renal/GU Renal InsufficiencyRenal disease     Musculoskeletal   Abdominal   Peds  Hematology   Anesthesia Other Findings   Reproductive/Obstetrics                           Anesthesia Physical Anesthesia Plan  ASA: IV  Anesthesia Plan: General   Post-op Pain Management:    Induction: Intravenous  Airway Management Planned: Oral ETT  Additional Equipment: Arterial line, CVP, PA Cath, TEE and Ultrasound Guidance Line Placement  Intra-op Plan:   Post-operative Plan: Post-operative intubation/ventilation  Informed Consent: I have reviewed the patients History and Physical, chart, labs and discussed the procedure including the risks, benefits and alternatives for the proposed anesthesia with the patient or authorized representative who has indicated his/her understanding and acceptance.     Plan Discussed with: CRNA and Surgeon  Anesthesia Plan Comments: (Plan routine monitors, A line, PA cath, GETA with TEE and post op ventilation)         Anesthesia Quick Evaluation

## 2015-11-15 NOTE — Progress Notes (Signed)
[id number] Pt sleepy. Son in room. Gave OHS booklet and care guide. Wrote down how to view pre op video. Discussed with pt how important it is to walk and use IS after surgery. Pt needs IS. Son stated that pt does not use walker or cane but has been having some issues with dizziness. Notified son that she would need someone with her after discharge 24/7 for at least the first week. He stated his dad would be there but he has some vision issues. Told son that if pt needs rehab after surgery that they can discuss with case manager. Demonstrated for pt getting up and down without use of arms. TCTS PA in to see pt . Graylon Good RN BSN 11/15/2015 2:19 PM

## 2015-11-15 NOTE — Progress Notes (Signed)
Pre-op Cardiac Surgery  Carotid Findings:   Findings suggest upper range 40-59% versus low range 60-79% right internal carotid artery stenosis, and 1-39% left internal carotid artery stenosis. Vertebral arteries are patent with antegrade flow.  Upper Extremity Right Left  Brachial Pressures 147-Triphasic 162-Triphasic  Radial Waveforms Triphasic Triphasic  Ulnar Waveforms Triphasic Triphasic  Palmar Arch (Allen's Test) Signal obliterates with radial compression, is unaffected with ulnar compression Within normal limits.    Lower  Extremity Right Left  Dorsalis Pedis Biphasic-noncompressible Monophasic-noncompressible  Anterior Tibial    Posterior Tibial Biphasic-noncompressible Biphasic-noncompressible  Great toe 166 136  Ankle/Brachial Indices    TBI 1.02 0.84    Findings:   Unable to calculate ABI due to noncompressible vessels. TBIs are within normal limits at rest.  11/15/2015 4:39 PM  Lita Mains, RVT, RDMS Maudry Mayhew, BS, RVT, RDCS, RDMS

## 2015-11-15 NOTE — Research (Signed)
[place] Study Informed Consent   Subject Name: [name] [name]  Subject met inclusion and exclusion criteria.  The informed consent form, study requirements and expectations were reviewed with the subject and questions and concerns were addressed prior to the signing of the consent form.  The subject verbalized understanding of the trial requirements.  The subject agreed to participate in the trial and signed the informed consent.  The informed consent was obtained prior to performance of any protocol-specific procedures for the subject.  A copy of the signed informed consent was given to the subject and a copy was placed in the subject's medical record.  Blossom Hoops 11/15/2015, 9:58 AM

## 2015-11-15 NOTE — Interval H&P Note (Signed)
Cath Lab Visit (complete for each Cath Lab visit)  Clinical Evaluation Leading to the Procedure:   ACS: No.  Non-ACS:    Anginal Classification: CCS III  Anti-ischemic medical therapy: Maximal Therapy (2 or more classes of medications)  Non-Invasive Test Results: No non-invasive testing performed  Prior CABG: No previous CABG      History and Physical Interval Note:  11/15/2015 10:16 AM  [name] [name]  has presented today for surgery, with the diagnosis of chest pain  The various methods of treatment have been discussed with the patient and family. After consideration of risks, benefits and other options for treatment, the patient has consented to  Procedure(s): Left Heart Cath and Coronary Angiography (N/A) as a surgical intervention .  The patient's history has been reviewed, patient examined, no change in status, stable for surgery.  I have reviewed the patient's chart and labs.  Questions were answered to the patient's satisfaction.     Shelva Majestic

## 2015-11-15 NOTE — Progress Notes (Addendum)
Site area: RFA Site Prior to Removal:  Level 0 Pressure Applied For:25 min Manual: yes   Patient Status During Pull:  stable Post Pull Site:  Level 0 Post Pull Instructions Given: yes  Post Pull Pulses Present: palpable Dressing Applied:  tegaderm Bedrest begins @ 1140 till 1540 Comments:

## 2015-11-15 NOTE — H&P (View-Only) (Signed)
Cardiology Office Note    Date:  11/09/2015   ID:  ALESA HERTLING, DOB 1931/08/16, MRN [id number]  PCP:  Alesia Richards, MD  Cardiologist:   Candee Furbish, MD     History of Present Illness:  [name] [name] is a 82 y.o. female here for evaluation of unstable angina. She was seen on 11/03/2015 for recent chest pain, vomiting, nausea. This occurred on 3 separate occasions. Last one was about a day and a half ago. On October 24 she ended up having an EKG showing deeply inverted T waves quite concerning for ischemia. She had troponin drawn on that day which was normal. Unfortunately about 6 months ago she lost both her daughter and her brother. After this episode, she became quite depressed and actually stopped taking many of her medications.   She has chest pain at home. Took 4 nitroglycerin in a single day. She did not go to the hospital. Her son tried to convince her.  I reviewed her EKG which is quite concerning showing deeply inverted T waves in the anterior precordial leads. These are changes from her prior EKG in 2015. Personally viewed.  About a day and a half ago she did have to take another nitroglycerin. She admits that her memory is not as good as it used to be.     Past Medical History:  Diagnosis Date  . Anemia   . Cataract   . Colon polyp 2009    TUBULAR ADENOMA  . Diabetes ([place])   . Diverticulosis of colon (without mention of hemorrhage) 2009  . Esophagitis 2009  . Gastritis 2009  . GERD (gastroesophageal reflux disease) 2009  . Hiatal hernia 2009  . Hyperlipemia   . Hypertension   . Thyroid disease    hypothyroid    Past Surgical History:  Procedure Laterality Date  . BREAST BIOPSY Left 1999   neg  . COLONOSCOPY  05/2012   Dr. Sharlett Iles  . DILATION AND CURETTAGE OF UTERUS  1984    Current Medications: Outpatient Medications Prior to Visit  Medication Sig Dispense Refill  . atenolol (TENORMIN) 100 MG tablet Take 100 mg by mouth daily.    Marland Kitchen  atorvastatin (LIPITOR) 80 MG tablet TAKE 1 TABLET DAILY FOR CHOLESTEROL 90 tablet 1  . isosorbide mononitrate (IMDUR) 30 MG 24 hr tablet Take 1 tablet (30 mg total) by mouth daily. 30 tablet 11  . metFORMIN (GLUCOPHAGE-XR) 500 MG 24 hr tablet Take 2 tablets (1,000 mg total) by mouth 2 (two) times daily. 360 tablet 1  . mirtazapine (REMERON) 15 MG tablet Take 1 tablet (15 mg total) by mouth at bedtime. 30 tablet 2  . nitroGLYCERIN (NITROSTAT) 0.4 MG SL tablet Dissolve 1 tablet under tongue every 3 to 5 minutes if needed for ChestPain 50 tablet 12  . polyethylene glycol (MIRALAX / GLYCOLAX) packet Take 17 g by mouth daily. 100 each 0  . verapamil (CALAN-SR) 240 MG CR tablet TAKE 1 TABLET AT BEDTIME 90 tablet 1   No facility-administered medications prior to visit.      Allergies:   Actos [pioglitazone]; Bee venom; and Persantine [dipyridamole]   Social History   Social History  . Marital status: Married    Spouse name: N/A  . Number of children: 3  . Years of education: N/A   Social History Main Topics  . Smoking status: Never Smoker  . Smokeless tobacco: Current User    Types: Snuff  . Alcohol use No  . Drug use:  No  . Sexual activity: Not Asked   Other Topics Concern  . None   Social History Narrative  . None     Family History:  The patient's family history includes Breast cancer in her maternal aunt; Diabetes in her father; Lung cancer in her brother; Prostate cancer in her brother; Stroke in her mother; Throat cancer in her maternal uncle.   ROS:   Please see the history of present illness.    ROS All other systems reviewed and are negative.   PHYSICAL EXAM:   VS:  BP 126/70   Pulse 90   Ht 5\' 1"  (1.549 m)   Wt 144 lb 12.8 oz (65.7 kg)   LMP  (LMP Unknown)   BMI 27.36 kg/m    GEN: Well nourished, well developed, in no acute distress Slightly disheveled HEENT: normal  Neck: no JVD, carotid bruits, or masses Cardiac: RRR;  1/6 systolic murmur heard at apex,  rubs, or gallops,no edema  Respiratory:  clear to auscultation bilaterally, normal work of breathing GI: soft, nontender, nondistended, + BS MS: no deformity or atrophy  Skin: warm and dry, no rash Neuro:  Alert and Oriented x 3, Strength and sensation are intact Psych: euthymic mood, full affect  Wt Readings from Last 3 Encounters:  11/09/15 144 lb 12.8 oz (65.7 kg)  11/03/15 145 lb (65.8 kg)  10/31/15 142 lb 12.8 oz (64.8 kg)      Studies/Labs Reviewed:   EKG:  EKG is not ordered today.  Prior EKGs as described above  Recent Labs: 10/31/2015: ALT 12; BUN 8; Creat 0.80; Hemoglobin 12.8; Magnesium 1.7; Platelets 212; Potassium 3.4; Sodium 141; TSH 0.77   Lipid Panel    Component Value Date/Time   CHOL 130 10/31/2015 1134   TRIG 109 10/31/2015 1134   HDL 52 10/31/2015 1134   CHOLHDL 2.5 10/31/2015 1134   VLDL 22 10/31/2015 1134   LDLCALC 56 10/31/2015 1134    Additional studies/ records that were reviewed today include:  Prior EKGs personally reviewed, medical records, lab work, office notes reviewed    ASSESSMENT:    1. Unstable angina ([place])   2. Abnormal EKG   3. Pure hypercholesterolemia   4. Essential hypertension      PLAN:  In order of problems listed above:  Unstable angina  - Concerning escalating anginal symptoms, taking nitroglycerin, EKG with worrisome changes, deep T-wave inversion anterior leads suggestive of ischemia.  - We will proceed with cardiac catheterization. Metformin will be held. Aspirin will be started. Risks and benefits of procedure have been discussed including stroke, heart attack, death, renal impairment, bleeding. She and her son discussed and she is willing to proceed.  - Currently on atorvastatin 80, Imdur 30.  Abnormal EKG  - Concerning for anterior wall ischemia  Diabetes  - Hold metformin prior to cardiac catheterization   Essential hypertension  - Medications reviewed. Currently well controlled.  Hyperlipidemia  -  Continue with atorvastatin  Medication Adjustments/Labs and Tests Ordered: Current medicines are reviewed at length with the patient today.  Concerns regarding medicines are outlined above.  Medication changes, Labs and Tests ordered today are listed in the Patient Instructions below. Patient Instructions  Medication Instructions:  Please start ASA 81 mg a day. The current medical regimen is effective;  continue present plan and medications.  Labwork: Please have blood work (BMP,CBC and INR)  Testing/Procedures: Your physician has requested that you have a cardiac catheterization. Cardiac catheterization is used to diagnose and/or treat various  heart conditions. Doctors may recommend this procedure for a number of different reasons. The most common reason is to evaluate chest pain. Chest pain can be a symptom of coronary artery disease (CAD), and cardiac catheterization can show whether plaque is narrowing or blocking your heart's arteries. This procedure is also used to evaluate the valves, as well as measure the blood flow and oxygen levels in different parts of your heart. For further information please visit [email or web address]. Please follow instruction sheet, as given.  Follow-Up: Follow up after your cardiac cath.   If you need a refill on your cardiac medications before your next appointment, please call your pharmacy.  Thank you for choosing [facility]!!        Signed, Candee Furbish, MD  11/09/2015 5:03 PM    [facility] [place], [place], [place]  [address] Phone: [phone]; Fax: [phone]

## 2015-11-16 ENCOUNTER — Inpatient Hospital Stay (HOSPITAL_COMMUNITY): Payer: Medicare Other | Admitting: Certified Registered Nurse Anesthetist

## 2015-11-16 ENCOUNTER — Inpatient Hospital Stay (HOSPITAL_COMMUNITY): Payer: Medicare Other

## 2015-11-16 ENCOUNTER — Encounter (HOSPITAL_COMMUNITY)
Admission: RE | Disposition: A | Discharge status: Home Health | Payer: Self-pay | Source: Ambulatory Visit | Attending: Thoracic Surgery (Cardiothoracic Vascular Surgery)

## 2015-11-16 ENCOUNTER — Encounter (HOSPITAL_COMMUNITY): Payer: Self-pay | Admitting: Certified Registered Nurse Anesthetist

## 2015-11-16 DIAGNOSIS — I2510 Atherosclerotic heart disease of native coronary artery without angina pectoris: Secondary | ICD-10-CM

## 2015-11-16 DIAGNOSIS — I200 Unstable angina: Secondary | ICD-10-CM

## 2015-11-16 HISTORY — PX: TEE WITHOUT CARDIOVERSION: SHX5443

## 2015-11-16 HISTORY — PX: CORONARY ARTERY BYPASS GRAFT: SHX141

## 2015-11-16 LAB — BASIC METABOLIC PANEL
Anion gap: 8 (ref 5–15)
BUN: 8 mg/dL (ref 6–20)
CO2: 19 mmol/L — ABNORMAL LOW (ref 22–32)
Calcium: 8.3 mg/dL — ABNORMAL LOW (ref 8.9–10.3)
Chloride: 112 mmol/L — ABNORMAL HIGH (ref 101–111)
Creatinine, Ser: 0.67 mg/dL (ref 0.44–1.00)
GFR calc Af Amer: >60 mL/min (ref >60)
GFR calc non Af Amer: >60 mL/min (ref >60)
Glucose, Bld: 137 mg/dL — ABNORMAL HIGH (ref 65–99)
Potassium: 3.7 mmol/L (ref 3.5–5.1)
Sodium: 139 mmol/L (ref 135–145)

## 2015-11-16 LAB — CBC
HCT: 35.1 % — ABNORMAL LOW (ref 36.0–46.0)
HCT: 37.3 % (ref 36.0–46.0)
HCT: 32.1 % — ABNORMAL LOW (ref 36.0–46.0)
Hemoglobin: 11.3 g/dL — ABNORMAL LOW (ref 12.0–15.0)
Hemoglobin: 12.5 g/dL (ref 12.0–15.0)
Hemoglobin: 10.7 g/dL — ABNORMAL LOW (ref 12.0–15.0)
MCH: 28.1 pg (ref 26.0–34.0)
MCH: 28.3 pg (ref 26.0–34.0)
MCH: 28 pg (ref 26.0–34.0)
MCHC: 32.2 g/dL (ref 30.0–36.0)
MCHC: 33.5 g/dL (ref 30.0–36.0)
MCHC: 33.3 g/dL (ref 30.0–36.0)
MCV: 87.3 fL (ref 78.0–100.0)
MCV: 84.4 fL (ref 78.0–100.0)
MCV: 84 fL (ref 78.0–100.0)
Platelets: 183 10*3/uL (ref 150–400)
Platelets: 88 10*3/uL — ABNORMAL LOW (ref 150–400)
Platelets: 96 10*3/uL — ABNORMAL LOW (ref 150–400)
RBC: 4.02 MIL/uL (ref 3.87–5.11)
RBC: 4.42 MIL/uL (ref 3.87–5.11)
RBC: 3.82 MIL/uL — ABNORMAL LOW (ref 3.87–5.11)
RDW: 13.1 % (ref 11.5–15.5)
RDW: 14.4 % (ref 11.5–15.5)
RDW: 14.9 % (ref 11.5–15.5)
WBC: 6.7 10*3/uL (ref 4.0–10.5)
WBC: 15 10*3/uL — ABNORMAL HIGH (ref 4.0–10.5)
WBC: 29 10*3/uL — ABNORMAL HIGH (ref 4.0–10.5)

## 2015-11-16 LAB — POCT I-STAT 3, ART BLOOD GAS (G3+)
Acid-base deficit: 8 mmol/L — ABNORMAL HIGH (ref 0.0–2.0)
Acid-base deficit: 9 mmol/L — ABNORMAL HIGH (ref 0.0–2.0)
Bicarbonate: 17.7 mmol/L — ABNORMAL LOW (ref 20.0–28.0)
Bicarbonate: 16.9 mmol/L — ABNORMAL LOW (ref 20.0–28.0)
O2 Saturation: 92 %
O2 Saturation: 93 %
Patient temperature: 33.7
Patient temperature: 37.2
TCO2: 19 mmol/L (ref 0–100)
TCO2: 18 mmol/L (ref 0–100)
pCO2 arterial: 31.4 mmHg — ABNORMAL LOW (ref 32.0–48.0)
pCO2 arterial: 37.2 mmHg (ref 32.0–48.0)
pH, Arterial: 7.342 — ABNORMAL LOW (ref 7.350–7.450)
pH, Arterial: 7.267 — ABNORMAL LOW (ref 7.350–7.450)
pO2, Arterial: 56 mmHg — ABNORMAL LOW (ref 83.0–108.0)
pO2, Arterial: 77 mmHg — ABNORMAL LOW (ref 83.0–108.0)

## 2015-11-16 LAB — GLUCOSE, CAPILLARY
Glucose-Capillary: 129 mg/dL — ABNORMAL HIGH (ref 65–99)
Glucose-Capillary: 139 mg/dL — ABNORMAL HIGH (ref 65–99)
Glucose-Capillary: 51 mg/dL — ABNORMAL LOW (ref 65–99)
Glucose-Capillary: 158 mg/dL — ABNORMAL HIGH (ref 65–99)
Glucose-Capillary: 148 mg/dL — ABNORMAL HIGH (ref 65–99)
Glucose-Capillary: 150 mg/dL — ABNORMAL HIGH (ref 65–99)
Glucose-Capillary: 118 mg/dL — ABNORMAL HIGH (ref 65–99)
Glucose-Capillary: 154 mg/dL — ABNORMAL HIGH (ref 65–99)

## 2015-11-16 LAB — VAS US DOPPLER PRE CABG
LEFT ECA DIAS: -16 cm/s
LEFT VERTEBRAL DIAS: -23 cm/s
Left CCA dist dias: 9 cm/s
Left CCA dist sys: 47 cm/s
Left CCA prox dias: -12 cm/s
Left CCA prox sys: -65 cm/s
Left ICA dist dias: -13 cm/s
Left ICA dist sys: -44 cm/s
Left ICA prox dias: -10 cm/s
Left ICA prox sys: -70 cm/s
RIGHT ECA DIAS: -15 cm/s
RIGHT VERTEBRAL DIAS: -19 cm/s
Right CCA prox dias: 24 cm/s
Right CCA prox sys: 87 cm/s
Right cca dist sys: -181 cm/s

## 2015-11-16 LAB — POCT I-STAT, CHEM 8
BUN: 5 mg/dL — ABNORMAL LOW (ref 6–20)
Calcium, Ion: 1.24 mmol/L (ref 1.15–1.40)
Chloride: 111 mmol/L (ref 101–111)
Creatinine, Ser: 0.4 mg/dL — ABNORMAL LOW (ref 0.44–1.00)
Glucose, Bld: 136 mg/dL — ABNORMAL HIGH (ref 65–99)
HCT: 32 % — ABNORMAL LOW (ref 36.0–46.0)
Hemoglobin: 10.9 g/dL — ABNORMAL LOW (ref 12.0–15.0)
Potassium: 4.4 mmol/L (ref 3.5–5.1)
Sodium: 144 mmol/L (ref 135–145)
TCO2: 19 mmol/L (ref 0–100)

## 2015-11-16 LAB — CREATININE, SERUM
Creatinine, Ser: 0.58 mg/dL (ref 0.44–1.00)
GFR calc Af Amer: >60 mL/min (ref >60)
GFR calc non Af Amer: >60 mL/min (ref >60)

## 2015-11-16 LAB — MAGNESIUM: Magnesium: 2.5 mg/dL — ABNORMAL HIGH (ref 1.7–2.4)

## 2015-11-16 LAB — SURGICAL PCR SCREEN
MRSA, PCR: NEGATIVE
Staphylococcus aureus: NEGATIVE

## 2015-11-16 LAB — PREPARE RBC (CROSSMATCH)

## 2015-11-16 LAB — PROTIME-INR
INR: 1.55
Prothrombin Time: 18.7 seconds — ABNORMAL HIGH (ref 11.4–15.2)

## 2015-11-16 LAB — POCT I-STAT 4, (NA,K, GLUC, HGB,HCT)
Glucose, Bld: 159 mg/dL — ABNORMAL HIGH (ref 65–99)
HCT: 35 % — ABNORMAL LOW (ref 36.0–46.0)
Hemoglobin: 11.9 g/dL — ABNORMAL LOW (ref 12.0–15.0)
Potassium: 3.3 mmol/L — ABNORMAL LOW (ref 3.5–5.1)
Sodium: 147 mmol/L — ABNORMAL HIGH (ref 135–145)

## 2015-11-16 LAB — APTT: aPTT: 45 seconds — ABNORMAL HIGH (ref 24–36)

## 2015-11-16 SURGERY — ECHOCARDIOGRAM, TRANSESOPHAGEAL
Anesthesia: General | Site: Chest

## 2015-11-16 MED ORDER — MAGNESIUM SULFATE 4 GM/100ML IV SOLN
4.0000 g | Freq: Once | INTRAVENOUS | Status: AC
  Administered 2015-11-16: 4 g via INTRAVENOUS
  Filled 2015-11-16: qty 100

## 2015-11-16 MED ORDER — ALBUMIN HUMAN 5 % IV SOLN
INTRAVENOUS | Status: DC | PRN
  Administered 2015-11-16 (×2): via INTRAVENOUS

## 2015-11-16 MED ORDER — PHENYLEPHRINE 40 MCG/ML (10ML) SYRINGE FOR IV PUSH (FOR BLOOD PRESSURE SUPPORT)
PREFILLED_SYRINGE | INTRAVENOUS | Status: AC
  Filled 2015-11-16: qty 10

## 2015-11-16 MED ORDER — TRAMADOL HCL 50 MG PO TABS: 50.0000 mg | ORAL_TABLET | ORAL | Status: DC | PRN

## 2015-11-16 MED ORDER — LACTATED RINGERS IV SOLN
INTRAVENOUS | Status: DC | PRN
  Administered 2015-11-16 (×2): via INTRAVENOUS

## 2015-11-16 MED ORDER — MORPHINE SULFATE (PF) 2 MG/ML IV SOLN: 1.0000 mg | INTRAVENOUS | Status: AC | PRN

## 2015-11-16 MED ORDER — NITROGLYCERIN IN D5W 200-5 MCG/ML-% IV SOLN: 0.0000 ug/min | INTRAVENOUS | Status: DC

## 2015-11-16 MED ORDER — PROTAMINE SULFATE 10 MG/ML IV SOLN
INTRAVENOUS | Status: AC
  Filled 2015-11-16: qty 25

## 2015-11-16 MED ORDER — SODIUM CHLORIDE 0.9 % IJ SOLN
INTRAMUSCULAR | Status: DC | PRN
  Administered 2015-11-16: 12 mL via TOPICAL

## 2015-11-16 MED ORDER — CHLORHEXIDINE GLUCONATE 0.12% ORAL RINSE (MEDLINE KIT)
15.0000 mL | Freq: Two times a day (BID) | OROMUCOSAL | Status: DC
  Administered 2015-11-16 – 2015-11-18 (×2): 15 mL via OROMUCOSAL

## 2015-11-16 MED ORDER — EPINEPHRINE PF 1 MG/ML IJ SOLN
INTRAMUSCULAR | Status: AC
  Filled 2015-11-16: qty 1

## 2015-11-16 MED ORDER — CALCIUM CHLORIDE 10 % IV SOLN
INTRAVENOUS | Status: DC | PRN
  Administered 2015-11-16: 200 mg via INTRAVENOUS

## 2015-11-16 MED ORDER — PROTAMINE SULFATE 10 MG/ML IV SOLN
INTRAVENOUS | Status: AC
  Filled 2015-11-16: qty 10

## 2015-11-16 MED ORDER — POTASSIUM CHLORIDE 10 MEQ/50ML IV SOLN
10.0000 meq | INTRAVENOUS | Status: AC
  Administered 2015-11-16 (×3): 10 meq via INTRAVENOUS

## 2015-11-16 MED ORDER — FAMOTIDINE IN NACL 20-0.9 MG/50ML-% IV SOLN
20.0000 mg | Freq: Two times a day (BID) | INTRAVENOUS | Status: AC
  Administered 2015-11-16 (×2): 20 mg via INTRAVENOUS
  Filled 2015-11-16: qty 50

## 2015-11-16 MED ORDER — DOCUSATE SODIUM 100 MG PO CAPS
200.0000 mg | ORAL_CAPSULE | Freq: Every day | ORAL | Status: DC
  Administered 2015-11-18 – 2015-11-19 (×2): 200 mg via ORAL
  Filled 2015-11-16 (×3): qty 2

## 2015-11-16 MED ORDER — ROCURONIUM BROMIDE 10 MG/ML (PF) SYRINGE
PREFILLED_SYRINGE | INTRAVENOUS | Status: AC
  Filled 2015-11-16: qty 10

## 2015-11-16 MED ORDER — PROPOFOL 10 MG/ML IV BOLUS
INTRAVENOUS | Status: AC
Start: 2015-11-16 — End: 2015-11-16
  Filled 2015-11-16: qty 20

## 2015-11-16 MED ORDER — DEXTROSE 5 % IV SOLN
INTRAVENOUS | Status: DC | PRN
  Administered 2015-11-16: 20 ug/min via INTRAVENOUS

## 2015-11-16 MED ORDER — METOPROLOL TARTRATE 25 MG/10 ML ORAL SUSPENSION: 12.5000 mg | Freq: Two times a day (BID) | ORAL | Status: DC

## 2015-11-16 MED ORDER — DEXTROSE 5 % IV SOLN
1.5000 g | Freq: Two times a day (BID) | INTRAVENOUS | Status: AC
  Administered 2015-11-16 – 2015-11-18 (×4): 1.5 g via INTRAVENOUS
  Filled 2015-11-16 (×4): qty 1.5

## 2015-11-16 MED ORDER — SODIUM CHLORIDE 0.9 % IV SOLN
INTRAVENOUS | Status: DC
  Administered 2015-11-16: 3.5 [IU]/h via INTRAVENOUS
  Filled 2015-11-16 (×2): qty 2.5

## 2015-11-16 MED ORDER — OXYCODONE HCL 5 MG PO TABS: 5.0000 mg | ORAL_TABLET | ORAL | Status: DC | PRN

## 2015-11-16 MED ORDER — LIDOCAINE 2% (20 MG/ML) 5 ML SYRINGE
INTRAMUSCULAR | Status: AC
  Filled 2015-11-16: qty 5

## 2015-11-16 MED ORDER — SODIUM CHLORIDE 0.45 % IV SOLN
INTRAVENOUS | Status: DC | PRN
  Administered 2015-11-16: 20 mL/h via INTRAVENOUS

## 2015-11-16 MED ORDER — SODIUM CHLORIDE 0.9 % IV SOLN: Freq: Once | INTRAVENOUS | Status: DC

## 2015-11-16 MED ORDER — SODIUM CHLORIDE 0.9 % IV SOLN: INTRAVENOUS | Status: DC

## 2015-11-16 MED ORDER — PROTAMINE SULFATE 10 MG/ML IV SOLN
INTRAVENOUS | Status: DC | PRN
  Administered 2015-11-16 (×3): 20 mg via INTRAVENOUS
  Administered 2015-11-16: 40 mg via INTRAVENOUS

## 2015-11-16 MED ORDER — HEPARIN SODIUM (PORCINE) 1000 UNIT/ML IJ SOLN
INTRAMUSCULAR | Status: AC
  Filled 2015-11-16: qty 1

## 2015-11-16 MED ORDER — MIRTAZAPINE 15 MG PO TABS
15.0000 mg | ORAL_TABLET | Freq: Every day | ORAL | Status: DC
  Administered 2015-11-17 – 2015-11-24 (×8): 15 mg via ORAL
  Filled 2015-11-16 (×8): qty 1

## 2015-11-16 MED ORDER — FENTANYL CITRATE (PF) 250 MCG/5ML IJ SOLN
INTRAMUSCULAR | Status: DC | PRN
  Administered 2015-11-16 (×2): 100 ug via INTRAVENOUS
  Administered 2015-11-16 (×2): 250 ug via INTRAVENOUS
  Administered 2015-11-16: 50 ug via INTRAVENOUS
  Administered 2015-11-16: 200 ug via INTRAVENOUS
  Administered 2015-11-16: 150 ug via INTRAVENOUS

## 2015-11-16 MED ORDER — LACTATED RINGERS IV SOLN
INTRAVENOUS | Status: DC | PRN
  Administered 2015-11-16 (×2): via INTRAVENOUS

## 2015-11-16 MED ORDER — ACETAMINOPHEN 160 MG/5ML PO SOLN: 650.0000 mg | Freq: Once | ORAL | Status: AC

## 2015-11-16 MED ORDER — ACETAMINOPHEN 500 MG PO TABS
1000.0000 mg | ORAL_TABLET | Freq: Four times a day (QID) | ORAL | Status: AC
  Administered 2015-11-17 – 2015-11-21 (×13): 1000 mg via ORAL
  Filled 2015-11-16 (×14): qty 2

## 2015-11-16 MED ORDER — LACTATED RINGERS IV SOLN: INTRAVENOUS | Status: DC

## 2015-11-16 MED ORDER — LUBIPROSTONE 24 MCG PO CAPS
24.0000 ug | ORAL_CAPSULE | Freq: Every day | ORAL | Status: DC
  Administered 2015-11-18 – 2015-11-25 (×8): 24 ug via ORAL
  Filled 2015-11-16 (×9): qty 1

## 2015-11-16 MED ORDER — SODIUM BICARBONATE 8.4 % IV SOLN
100.0000 meq | Freq: Once | INTRAVENOUS | Status: AC
  Administered 2015-11-16: 100 meq via INTRAVENOUS

## 2015-11-16 MED ORDER — FENTANYL CITRATE (PF) 250 MCG/5ML IJ SOLN
INTRAMUSCULAR | Status: AC
  Filled 2015-11-16: qty 25

## 2015-11-16 MED ORDER — LACTATED RINGERS IV SOLN
INTRAVENOUS | Status: DC | PRN
  Administered 2015-11-16: 07:00:00 via INTRAVENOUS

## 2015-11-16 MED ORDER — LACTATED RINGERS IV SOLN: 500.0000 mL | Freq: Once | INTRAVENOUS | Status: DC | PRN

## 2015-11-16 MED ORDER — ACETAMINOPHEN 650 MG RE SUPP
650.0000 mg | Freq: Once | RECTAL | Status: AC
  Administered 2015-11-16: 650 mg via RECTAL

## 2015-11-16 MED ORDER — METOPROLOL TARTRATE 12.5 MG HALF TABLET
12.5000 mg | ORAL_TABLET | Freq: Two times a day (BID) | ORAL | Status: DC
Start: 2015-11-16 — End: 2015-11-20
  Administered 2015-11-18: 12.5 mg via ORAL
  Filled 2015-11-16: qty 1

## 2015-11-16 MED ORDER — CHLORHEXIDINE GLUCONATE 0.12 % MT SOLN
15.0000 mL | OROMUCOSAL | Status: AC
  Administered 2015-11-16: 15 mL via OROMUCOSAL

## 2015-11-16 MED ORDER — PHENYLEPHRINE HCL 10 MG/ML IJ SOLN
0.0000 ug/min | INTRAVENOUS | Status: DC
  Administered 2015-11-16: 60 ug/min via INTRAVENOUS
  Filled 2015-11-16 (×2): qty 2

## 2015-11-16 MED ORDER — METOPROLOL TARTRATE 5 MG/5ML IV SOLN: 2.5000 mg | INTRAVENOUS | Status: DC | PRN

## 2015-11-16 MED ORDER — ASPIRIN 81 MG PO CHEW: 324.0000 mg | CHEWABLE_TABLET | Freq: Every day | ORAL | Status: DC

## 2015-11-16 MED ORDER — CALCIUM CHLORIDE 10 % IV SOLN
INTRAVENOUS | Status: AC
  Filled 2015-11-16: qty 10

## 2015-11-16 MED ORDER — HEMOSTATIC AGENTS (NO CHARGE) OPTIME
TOPICAL | Status: DC | PRN
  Administered 2015-11-16: 1 via TOPICAL

## 2015-11-16 MED ORDER — MIDAZOLAM HCL 5 MG/5ML IJ SOLN
INTRAMUSCULAR | Status: DC | PRN
  Administered 2015-11-16: 2 mg via INTRAVENOUS
  Administered 2015-11-16: 1 mg via INTRAVENOUS
  Administered 2015-11-16: 2 mg via INTRAVENOUS

## 2015-11-16 MED ORDER — ROCURONIUM BROMIDE 10 MG/ML (PF) SYRINGE
PREFILLED_SYRINGE | INTRAVENOUS | Status: DC | PRN
  Administered 2015-11-16 (×4): 50 mg via INTRAVENOUS

## 2015-11-16 MED ORDER — SODIUM CHLORIDE 0.9 % IR SOLN
Status: DC | PRN
  Administered 2015-11-16: 1000 mL

## 2015-11-16 MED ORDER — ONDANSETRON HCL 4 MG/2ML IJ SOLN
4.0000 mg | Freq: Four times a day (QID) | INTRAMUSCULAR | Status: DC | PRN
  Administered 2015-11-17 – 2015-11-19 (×3): 4 mg via INTRAVENOUS
  Filled 2015-11-16 (×3): qty 2

## 2015-11-16 MED ORDER — DEXMEDETOMIDINE HCL IN NACL 200 MCG/50ML IV SOLN
0.0000 ug/kg/h | INTRAVENOUS | Status: DC
  Administered 2015-11-16: 0.3 ug/kg/h via INTRAVENOUS
  Filled 2015-11-16: qty 50

## 2015-11-16 MED ORDER — HEPARIN SODIUM (PORCINE) 1000 UNIT/ML IJ SOLN
INTRAMUSCULAR | Status: DC | PRN
  Administered 2015-11-16: 2000 [IU] via INTRAVENOUS
  Administered 2015-11-16: 5000 [IU] via INTRAVENOUS
  Administered 2015-11-16: 8000 [IU] via INTRAVENOUS

## 2015-11-16 MED ORDER — SODIUM CHLORIDE 0.9% FLUSH
3.0000 mL | Freq: Two times a day (BID) | INTRAVENOUS | Status: DC
  Administered 2015-11-17 – 2015-11-18 (×2): 3 mL via INTRAVENOUS
  Administered 2015-11-19: 10 mL via INTRAVENOUS

## 2015-11-16 MED ORDER — ORAL CARE MOUTH RINSE
15.0000 mL | Freq: Four times a day (QID) | OROMUCOSAL | Status: DC
  Administered 2015-11-17 – 2015-11-18 (×7): 15 mL via OROMUCOSAL

## 2015-11-16 MED ORDER — ARTIFICIAL TEARS OP OINT
TOPICAL_OINTMENT | OPHTHALMIC | Status: DC | PRN
Start: 2015-11-16 — End: 2015-11-16
  Administered 2015-11-16: 1 via OPHTHALMIC

## 2015-11-16 MED ORDER — VANCOMYCIN HCL IN DEXTROSE 1-5 GM/200ML-% IV SOLN
1000.0000 mg | Freq: Once | INTRAVENOUS | Status: AC
  Administered 2015-11-16: 1000 mg via INTRAVENOUS
  Filled 2015-11-16: qty 200

## 2015-11-16 MED ORDER — SODIUM CHLORIDE 0.9% FLUSH: 3.0000 mL | INTRAVENOUS | Status: DC | PRN

## 2015-11-16 MED ORDER — SODIUM CHLORIDE 0.9 % IV SOLN
INTRAVENOUS | Status: DC | PRN
  Administered 2015-11-16: 13:00:00 via INTRAVENOUS

## 2015-11-16 MED ORDER — ASPIRIN EC 325 MG PO TBEC
325.0000 mg | DELAYED_RELEASE_TABLET | Freq: Every day | ORAL | Status: DC
  Administered 2015-11-17 – 2015-11-25 (×9): 325 mg via ORAL
  Filled 2015-11-16 (×9): qty 1

## 2015-11-16 MED ORDER — MIDAZOLAM HCL 10 MG/2ML IJ SOLN
INTRAMUSCULAR | Status: AC
  Filled 2015-11-16: qty 2

## 2015-11-16 MED ORDER — 0.9 % SODIUM CHLORIDE (POUR BTL) OPTIME
TOPICAL | Status: DC | PRN
  Administered 2015-11-16: 5000 mL

## 2015-11-16 MED ORDER — ACETAMINOPHEN 160 MG/5ML PO SOLN
1000.0000 mg | Freq: Four times a day (QID) | ORAL | Status: AC
  Administered 2015-11-16: 1000 mg
  Filled 2015-11-16: qty 40.6

## 2015-11-16 MED ORDER — ALBUMIN HUMAN 5 % IV SOLN
250.0000 mL | INTRAVENOUS | Status: AC | PRN
  Administered 2015-11-16 (×2): 250 mL via INTRAVENOUS

## 2015-11-16 MED ORDER — PHENYLEPHRINE 40 MCG/ML (10ML) SYRINGE FOR IV PUSH (FOR BLOOD PRESSURE SUPPORT)
PREFILLED_SYRINGE | INTRAVENOUS | Status: DC | PRN
  Administered 2015-11-16: 80 ug via INTRAVENOUS

## 2015-11-16 MED ORDER — BISACODYL 5 MG PO TBEC
10.0000 mg | DELAYED_RELEASE_TABLET | Freq: Every day | ORAL | Status: DC
  Administered 2015-11-18 – 2015-11-21 (×3): 10 mg via ORAL
  Filled 2015-11-16 (×5): qty 2

## 2015-11-16 MED ORDER — PANTOPRAZOLE SODIUM 40 MG PO TBEC
40.0000 mg | DELAYED_RELEASE_TABLET | Freq: Every day | ORAL | Status: DC
  Administered 2015-11-18 – 2015-11-25 (×8): 40 mg via ORAL
  Filled 2015-11-16 (×8): qty 1

## 2015-11-16 MED ORDER — PHENYLEPHRINE HCL 10 MG/ML IJ SOLN
INTRAVENOUS | Status: DC | PRN
  Administered 2015-11-16: 10 ug/min via INTRAVENOUS

## 2015-11-16 MED ORDER — BISACODYL 10 MG RE SUPP: 10.0000 mg | Freq: Every day | RECTAL | Status: DC

## 2015-11-16 MED ORDER — SODIUM CHLORIDE 0.9 % IV SOLN
250.0000 mL | INTRAVENOUS | Status: DC
  Administered 2015-11-17: 250 mL via INTRAVENOUS

## 2015-11-16 MED ORDER — PROPOFOL 10 MG/ML IV BOLUS
INTRAVENOUS | Status: DC | PRN
  Administered 2015-11-16: 20 mg via INTRAVENOUS
  Administered 2015-11-16: 40 mg via INTRAVENOUS

## 2015-11-16 MED ORDER — MORPHINE SULFATE (PF) 2 MG/ML IV SOLN: 2.0000 mg | INTRAVENOUS | Status: DC | PRN

## 2015-11-16 MED ORDER — INSULIN REGULAR BOLUS VIA INFUSION
0.0000 [IU] | Freq: Three times a day (TID) | INTRAVENOUS | Status: DC
  Filled 2015-11-16: qty 10

## 2015-11-16 MED ORDER — LACTATED RINGERS IV SOLN
INTRAVENOUS | Status: DC
  Administered 2015-11-18: 15:00:00 via INTRAVENOUS

## 2015-11-16 MED ORDER — POLYETHYLENE GLYCOL 3350 17 G PO PACK
17.0000 g | PACK | Freq: Every day | ORAL | Status: DC
  Administered 2015-11-18 – 2015-11-25 (×6): 17 g via ORAL
  Filled 2015-11-16 (×7): qty 1

## 2015-11-16 MED ORDER — MIDAZOLAM HCL 2 MG/2ML IJ SOLN: 2.0000 mg | INTRAMUSCULAR | Status: DC | PRN

## 2015-11-16 MED FILL — Heparin Sodium (Porcine) Inj 1000 Unit/ML: INTRAMUSCULAR | Qty: 30 | Status: AC

## 2015-11-16 MED FILL — Sodium Chloride IV Soln 0.9%: INTRAVENOUS | Qty: 1000 | Status: AC

## 2015-11-16 MED FILL — Magnesium Sulfate Inj 50%: INTRAMUSCULAR | Qty: 10 | Status: AC

## 2015-11-16 MED FILL — Potassium Chloride Inj 2 mEq/ML: INTRAVENOUS | Qty: 40 | Status: AC

## 2015-11-16 SURGICAL SUPPLY — 108 items
ADH SKN CLS APL DERMABOND .7 (GAUZE/BANDAGES/DRESSINGS) ×3
BAG DECANTER FOR FLEXI CONT (MISCELLANEOUS) ×4 IMPLANT
BANDAGE ACE 4X5 VEL STRL LF (GAUZE/BANDAGES/DRESSINGS) ×4 IMPLANT
BANDAGE ACE 6X5 VEL STRL LF (GAUZE/BANDAGES/DRESSINGS) ×4 IMPLANT
BASKET HEART (ORDER IN 25'S) (MISCELLANEOUS) ×1
BASKET HEART (ORDER IN 25S) (MISCELLANEOUS) ×3 IMPLANT
BATTERY MAXDRIVER (MISCELLANEOUS) ×2 IMPLANT
BLADE STERNUM SYSTEM 6 (BLADE) ×4 IMPLANT
BNDG GAUZE ELAST 4 BULKY (GAUZE/BANDAGES/DRESSINGS) ×4 IMPLANT
CANISTER SUCTION 2500CC (MISCELLANEOUS) ×4 IMPLANT
CANNULA EZ GLIDE AORTIC 21FR (CANNULA) ×4 IMPLANT
CANNULA VESSEL 3MM BLUNT TIP (CANNULA) ×4 IMPLANT
CATH CPB KIT HENDRICKSON (MISCELLANEOUS) ×4 IMPLANT
CATH ROBINSON RED A/P 18FR (CATHETERS) ×4 IMPLANT
CATH THORACIC 36FR (CATHETERS) ×4 IMPLANT
CATH THORACIC 36FR RT ANG (CATHETERS) ×4 IMPLANT
CLIP FOGARTY SPRING 6M (CLIP) ×2 IMPLANT
CLIP RETRACTION 3.0MM CORONARY (MISCELLANEOUS) ×2 IMPLANT
CLIP TI MEDIUM 24 (CLIP) IMPLANT
CLIP TI WIDE RED SMALL 24 (CLIP) ×6 IMPLANT
CRADLE DONUT ADULT HEAD (MISCELLANEOUS) ×4 IMPLANT
DERMABOND ADVANCED (GAUZE/BANDAGES/DRESSINGS) ×1
DERMABOND ADVANCED .7 DNX12 (GAUZE/BANDAGES/DRESSINGS) ×1 IMPLANT
DRAPE CARDIOVASCULAR INCISE (DRAPES) ×4
DRAPE SLUSH/WARMER DISC (DRAPES) ×4 IMPLANT
DRAPE SRG 135X102X78XABS (DRAPES) ×3 IMPLANT
DRSG AQUACEL AG ADV 3.5X 4 (GAUZE/BANDAGES/DRESSINGS) ×2 IMPLANT
DRSG COVADERM 4X14 (GAUZE/BANDAGES/DRESSINGS) ×4 IMPLANT
ELECT REM PT RETURN 9FT ADLT (ELECTROSURGICAL) ×8
ELECTRODE REM PT RTRN 9FT ADLT (ELECTROSURGICAL) ×6 IMPLANT
FELT TEFLON 1X6 (MISCELLANEOUS) ×8 IMPLANT
GAUZE SPONGE 4X4 12PLY STRL (GAUZE/BANDAGES/DRESSINGS) ×8 IMPLANT
GLOVE SURG SIGNA 7.5 PF LTX (GLOVE) ×12 IMPLANT
GOWN STRL REUS W/ TWL LRG LVL3 (GOWN DISPOSABLE) ×12 IMPLANT
GOWN STRL REUS W/ TWL XL LVL3 (GOWN DISPOSABLE) ×6 IMPLANT
GOWN STRL REUS W/TWL LRG LVL3 (GOWN DISPOSABLE) ×16
GOWN STRL REUS W/TWL XL LVL3 (GOWN DISPOSABLE) ×8
HEMOSTAT POWDER SURGIFOAM 1G (HEMOSTASIS) ×12 IMPLANT
HEMOSTAT SURGICEL 2X14 (HEMOSTASIS) ×4 IMPLANT
INSERT FOGARTY XLG (MISCELLANEOUS) IMPLANT
KIT BASIN OR (CUSTOM PROCEDURE TRAY) ×4 IMPLANT
KIT ROOM TURNOVER OR (KITS) ×4 IMPLANT
KIT SUCTION CATH 14FR (SUCTIONS) ×8 IMPLANT
KIT VASOVIEW HEMOPRO VH 3000 (KITS) ×4 IMPLANT
MARKER GRAFT CORONARY BYPASS (MISCELLANEOUS) ×12 IMPLANT
NS IRRIG 1000ML POUR BTL (IV SOLUTION) ×22 IMPLANT
PACK OPEN HEART (CUSTOM PROCEDURE TRAY) ×4 IMPLANT
PAD ARMBOARD 7.5X6 YLW CONV (MISCELLANEOUS) ×8 IMPLANT
PAD ELECT DEFIB RADIOL ZOLL (MISCELLANEOUS) ×4 IMPLANT
PENCIL BUTTON HOLSTER BLD 10FT (ELECTRODE) ×4 IMPLANT
PLATE STERNAL 2.3X208 14H 2-PK (Plate) ×2 IMPLANT
POSITIONER ACROBAT-I OFFPUMP (MISCELLANEOUS) ×2 IMPLANT
PUNCH AORTIC ROTATE 4.0MM (MISCELLANEOUS) IMPLANT
PUNCH AORTIC ROTATE 4.5MM 8IN (MISCELLANEOUS) IMPLANT
PUNCH AORTIC ROTATE 5MM 8IN (MISCELLANEOUS) IMPLANT
SCREW BONE LOCKING 2.3X9 (Screw) ×12 IMPLANT
SCREW LOCKING TI 2.3X11MM (Screw) ×14 IMPLANT
SHUNT FLO COIL 1.50MM (MISCELLANEOUS) ×2 IMPLANT
SPONGE GAUZE 4X4 12PLY STER LF (GAUZE/BANDAGES/DRESSINGS) ×4 IMPLANT
SPONGE LAP 18X18 X RAY DECT (DISPOSABLE) ×2 IMPLANT
SUT BONE WAX W31G (SUTURE) ×4 IMPLANT
SUT ETHIBOND 2 0 SH (SUTURE) ×16
SUT ETHIBOND 2 0 SH 36X2 (SUTURE) ×4 IMPLANT
SUT MNCRL AB 4-0 PS2 18 (SUTURE) ×2 IMPLANT
SUT PROLENE 3 0 SH DA (SUTURE) ×4 IMPLANT
SUT PROLENE 4 0 RB 1 (SUTURE)
SUT PROLENE 4 0 SH DA (SUTURE) IMPLANT
SUT PROLENE 4-0 RB1 .5 CRCL 36 (SUTURE) IMPLANT
SUT PROLENE 5 0 C 1 36 (SUTURE) ×4 IMPLANT
SUT PROLENE 6 0 C 1 30 (SUTURE) ×18 IMPLANT
SUT PROLENE 7 0 BV 1 (SUTURE) ×6 IMPLANT
SUT PROLENE 7 0 BV1 MDA (SUTURE) ×4 IMPLANT
SUT PROLENE 8 0 BV175 6 (SUTURE) ×6 IMPLANT
SUT SILK  1 MH (SUTURE) ×5
SUT SILK 1 MH (SUTURE) ×5 IMPLANT
SUT SILK 1 TIES 10X30 (SUTURE) ×2 IMPLANT
SUT SILK 2 0 SH CR/8 (SUTURE) ×2 IMPLANT
SUT SILK 2 0 TIES 10X30 (SUTURE) ×2 IMPLANT
SUT SILK 2 0 TIES 17X18 (SUTURE) ×4
SUT SILK 2-0 18XBRD TIE BLK (SUTURE) ×1 IMPLANT
SUT SILK 3 0 SH CR/8 (SUTURE) ×2 IMPLANT
SUT SILK 4 0 TIE 10X30 (SUTURE) ×2 IMPLANT
SUT STEEL 6MS V (SUTURE) ×4 IMPLANT
SUT STEEL STERNAL CCS#1 18IN (SUTURE) IMPLANT
SUT STEEL SZ 6 DBL 3X14 BALL (SUTURE) ×4 IMPLANT
SUT TEM PAC WIRE 2 0 SH (SUTURE) ×10 IMPLANT
SUT VIC AB 1 CTX 36 (SUTURE) ×8
SUT VIC AB 1 CTX36XBRD ANBCTR (SUTURE) ×6 IMPLANT
SUT VIC AB 2-0 CT1 27 (SUTURE) ×8
SUT VIC AB 2-0 CT1 TAPERPNT 27 (SUTURE) ×2 IMPLANT
SUT VIC AB 2-0 CTX 27 (SUTURE) ×4 IMPLANT
SUT VIC AB 3-0 SH 27 (SUTURE)
SUT VIC AB 3-0 SH 27X BRD (SUTURE) IMPLANT
SUT VIC AB 3-0 X1 27 (SUTURE) ×4 IMPLANT
SUT VICRYL 4-0 PS2 18IN ABS (SUTURE) IMPLANT
SUTURE E-PAK OPEN HEART (SUTURE) ×4 IMPLANT
SYS GUIDANT ACHIEVE OFF PUMP (MISCELLANEOUS) ×2 IMPLANT
SYSTEM HEARTSTRING SEAL 3.8 (VASCULAR PRODUCTS) ×2 IMPLANT
SYSTEM HEARTSTRING SEAL 3.8MM (VASCULAR PRODUCTS) ×8
SYSTEM SAHARA CHEST DRAIN ATS (WOUND CARE) ×4 IMPLANT
TAPE CLOTH SURG 4X10 WHT LF (GAUZE/BANDAGES/DRESSINGS) ×4 IMPLANT
TOWEL OR 17X24 6PK STRL BLUE (TOWEL DISPOSABLE) ×8 IMPLANT
TOWEL OR 17X26 10 PK STRL BLUE (TOWEL DISPOSABLE) ×8 IMPLANT
TRAY FOLEY IC TEMP SENS 16FR (CATHETERS) ×4 IMPLANT
TUBE FEEDING 8FR 16IN STR KANG (MISCELLANEOUS) ×4 IMPLANT
TUBING INSUFFLATION (TUBING) ×4 IMPLANT
UNDERPAD 30X30 (UNDERPADS AND DIAPERS) ×4 IMPLANT
WATER STERILE IRR 1000ML POUR (IV SOLUTION) ×8 IMPLANT

## 2015-11-16 NOTE — Brief Op Note (Addendum)
11/15/2015 - 11/16/2015  1:47 PM  PATIENT:  [name] [name]  82 y.o. female  PRE-OPERATIVE DIAGNOSIS:  CAD  POST-OPERATIVE DIAGNOSIS:  CAD  PROCEDURE:  TRANSESOPHAGEAL ECHOCARDIOGRAM (TEE) by Dr. Glennon Mac,  MEDIAN STERNOTOMY OFF PUMP CORONARY ARTERY BYPASS GRAFTING (CABG) x FOUR  LIMA to LAD,   SVG to OM,   SVG to RAMUS INTERMEDIUS,   SVG to PDA ENDOSCOPICALLY HARVESTED RIGHT GREATER SAPHENOUS VEIN  SURGEON:  Surgeon(s) and Role:    * Melrose Nakayama, MD - Primary  PHYSICIAN ASSISTANT: Lars Pinks PA-C  ANESTHESIA:   general  EBL:  Total I/O In: [id number] [I.V.:3450; Blood:446; IV [id number] Out: 2000 [Urine:1200; Blood:800]  DRAINS: Chest tubes placed in the mediastinal and pleural spaces   COUNTS CORRECT:  YES  PLAN OF CARE: Admit to inpatient   PATIENT DISPOSITION:  ICU - intubated and hemodynamically stable.   Delay start of Pharmacological VTE agent (>24hrs) due to surgical blood loss or risk of bleeding: yes  BASELINE WEIGHT: 63 kg  Procedure done Off pump secondary to severely calcified ascending aorta.

## 2015-11-16 NOTE — Anesthesia Procedure Notes (Addendum)
Central Venous Catheter Insertion Performed by: anesthesiologist Patient location: Pre-op. Preanesthetic checklist: patient identified, IV checked, site marked, risks and benefits discussed, surgical consent, monitors and equipment checked, pre-op evaluation, timeout performed and anesthesia consent Position: Trendelenburg Lidocaine 1% used for infiltration Landmarks identified and Seldinger technique used Catheter size: 9 Fr Central line was placed.MAC introducer Swan type and PA catheter depth:thermodilationProcedure performed using ultrasound guided technique. Attempts: 1 Following insertion, line sutured and dressing applied. Post procedure assessment: blood return through all ports, free fluid flow and no air. Patient tolerated the procedure well with no immediate complications.

## 2015-11-16 NOTE — H&P (View-Only) (Signed)
[address]       [place] [address]             [phone]        Alysandra F Rayos  Medical Record [id number] Date of Birth: 17-May-1931  Referring: No ref. provider found Primary Care: Alesia Richards, MD  Chief Complaint:  Chest pain   History of Present Illness:     Ms. [name] [name] is an 82 year old female with a past medical history positive for anemia, depression, CKD, Diabetes mellitus, GERD, hypothyroidism, and hypertension who presented to [place] for an evaluation of her unstable angina. She was seen last month in her PCP office for complaints of chest pain, vomiting, and nausea. Her troponin was normal this admission. An EKG was done showing concern for ischemia. She has been treating her chest pain at home with nitroglycerin, up to 4 a day for a few months. She underwent cardiac catherization today which showed severe multivessel disease. We are consulted to work her up for revascularization with CABG.  No current chest pain.   Of note the patient lives with her husband who is going blind per the son. The son lives about 45 minutes away. She can do all ADLs but is otherwise sedentary.    Current Activity/ Functional Status: ADL's: independent for UE/LE dressing, toileting, brush teeth, and washface with no assistive devices. , can do all activities of daily living, otherwise sedentary    Zubrod Score: At the time of surgery this patient's most appropriate activity status/level should be described as: []     0    Normal activity, no symptoms [x]     1    Restricted in physical strenuous activity but ambulatory, able to do out light work []     2    Ambulatory and capable of self care, unable to do work activities, up and about                 more than 50%  Of the time                            []     3    Only limited self care, in bed greater than 50% of waking hours []     4    Completely disabled, no self care, confined to bed or  chair []     5    Moribund  Past Medical History:  Diagnosis Date  . Anemia   . Anginal pain ([place])   . Cataract   . CKD stage 2 due to type 2 diabetes mellitus ([place])   . Colon polyp 2009    TUBULAR ADENOMA  . Coronary artery disease   . Depression   . Diabetes ([place])   . Diverticulosis of colon (without mention of hemorrhage) 2009  . Esophagitis 2009  . Gastritis 2009  . GERD (gastroesophageal reflux disease) 2009  . Hiatal hernia 2009  . Hyperlipemia   . Hypertension   . Thyroid disease    hypothyroid    Past Surgical History:  Procedure Laterality Date  . BREAST BIOPSY Left 1999   neg  . CARDIAC CATHETERIZATION N/A 11/15/2015   Procedure: Left Heart Cath and Coronary Angiography;  Surgeon: Troy Sine, MD;  Location: [facility];  Service: Cardiovascular;  Laterality: N/A;  . COLONOSCOPY  05/2012   Dr. Sharlett Iles  . DILATION AND CURETTAGE OF UTERUS  1984    History  Smoking Status  . Never Smoker  Smokeless Tobacco  . Current User  . Types: Snuff    History  Alcohol Use No    Social History   Social History  . Marital status: Married    Spouse name: N/A  . Number of children: 3  . Years of education: N/A   Occupational History  . Not on file.   Social History Main Topics  . Smoking status: Never Smoker  . Smokeless tobacco: Current User    Types: Snuff  . Alcohol use No  . Drug use: No  . Sexual activity: Not on file   Other Topics Concern  . Not on file   Social History Narrative  . No narrative on file    Allergies  Allergen Reactions  . Bee Venom Anaphylaxis and Other (See Comments)    Eyes swell, cannot breathe  . Persantine [Dipyridamole] Other (See Comments)    "went crazy"  . Actos [Pioglitazone] Other (See Comments)    UNSPECIFIED REACTION     Current Facility-Administered Medications  Medication Dose Route Frequency Provider Last Rate Last Dose  . 0.9 %  sodium chloride infusion  250 mL Intravenous PRN Troy Sine, MD      . 0.9 %  sodium chloride infusion   Intravenous Continuous Troy Sine, MD 125 mL/hr at 11/15/15 1113 500 mL at 11/15/15 1113  . acetaminophen (TYLENOL) tablet 650 mg  650 mg Oral Q4H PRN Troy Sine, MD      . aspirin 81 MG chewable tablet           . [START ON 11/16/2015] aspirin chewable tablet 81 mg  81 mg Oral Daily Troy Sine, MD      . atorvastatin (LIPITOR) tablet 80 mg  80 mg Oral q1800 Troy Sine, MD      . diazepam (VALIUM) tablet 5 mg  5 mg Oral Q4H PRN Troy Sine, MD      . heparin ADULT infusion 100 units/mL (25000 units/279mL sodium chloride 0.45%)  950 Units/hr Intravenous Continuous Kendra P Hiatt, RPH      . ondansetron (ZOFRAN) injection 4 mg  4 mg Intravenous Q6H PRN Troy Sine, MD      . sodium chloride flush (NS) 0.9 % injection 3 mL  3 mL Intravenous Q12H Troy Sine, MD      . sodium chloride flush (NS) 0.9 % injection 3 mL  3 mL Intravenous PRN Troy Sine, MD        Prescriptions Prior to Admission  Medication Sig Dispense Refill Last Dose  . aspirin EC 81 MG tablet Take 1 tablet (81 mg total) by mouth daily. 90 tablet 3 11/14/2015 at Unknown time  . atenolol (TENORMIN) 100 MG tablet Take 100 mg by mouth daily.   11/14/2015 at Unknown  . atorvastatin (LIPITOR) 80 MG tablet TAKE 1 TABLET DAILY FOR CHOLESTEROL (Patient taking differently: TAKE 80 MG DAILY FOR CHOLESTEROL) 90 tablet 1 11/14/2015 at Unknown time  . isosorbide mononitrate (IMDUR) 30 MG 24 hr tablet Take 1 tablet (30 mg total) by mouth daily. 30 tablet 11 11/14/2015 at Unknown time  . lubiprostone (AMITIZA) 24 MCG capsule Take 24 mcg by mouth daily.   11/14/2015 at Unknown time  . metFORMIN (GLUCOPHAGE-XR) 500 MG 24 hr tablet Take 2 tablets (1,000 mg total) by mouth 2 (two) times daily. 360 tablet 1 Past Week at Unknown time  .  mirtazapine (REMERON) 15 MG tablet Take 1 tablet (15 mg total) by mouth at bedtime. 30 tablet 2 Unknown at Unknown  . nitroGLYCERIN (NITROSTAT)  0.4 MG SL tablet Dissolve 1 tablet under tongue every 3 to 5 minutes if needed for ChestPain 50 tablet 12 Past Week at Unknown time  . verapamil (CALAN-SR) 240 MG CR tablet TAKE 1 TABLET AT BEDTIME (Patient taking differently: TAKE 240 MG BY MOUTH AT BEDTIME) 90 tablet 1 11/14/2015 at Unknown time  . polyethylene glycol (MIRALAX / GLYCOLAX) packet Take 17 g by mouth daily. (Patient not taking: Reported on 11/15/2015) 100 each 0 Not Taking at Unknown time    Family History  Problem Relation Age of Onset  . Diabetes Father   . Stroke Mother   . Breast cancer Maternal Aunt   . Throat cancer Maternal Uncle   . Lung cancer Brother   . Prostate cancer Brother   . Rectal cancer Neg Hx   . Stomach cancer Neg Hx   . Colon cancer Neg Hx   . Esophageal cancer Neg Hx      Review of Systems:  Pertinent items are noted in HPI.     Cardiac Review of Systems: Y or N  Chest Pain [ x   ]  Resting SOB [   ] Exertional SOB  [  ]  Orthopnea [  ]   Pedal Edema [   ]    Palpitations [  ] Syncope  [  ]   Presyncope [   ]  General Review of Systems: [Y] = yes [  ]=no Constitional: recent weight change [  ]; anorexia [  ]; fatigue [ x ]; nausea [ x ]; night sweats [  ]; fever [  ]; or chills [  ]                                                               Dental: poor dentition[  ]; Last Dentist visit:   Eye : blurred vision [  ]; diplopia [   ]; vision changes [  ];  Amaurosis fugax[  ]; Resp: cough [  ];  wheezing[  ];  hemoptysis[  ]; shortness of breath[  ]; paroxysmal nocturnal dyspnea[  ]; dyspnea on exertion[  ]; or orthopnea[  ];  GI:  gallstones[  ], vomiting[ x ];  dysphagia[  ]; melena[  ];  hematochezia [  ]; heartburn[  ];   Hx of  Colonoscopy[  ]; GU: kidney stones [  ]; hematuria[  ];   dysuria [  ];  nocturia[  ];  history of     obstruction [  ]; urinary frequency [  ]             Skin: rash, swelling[  ];, hair loss[  ];  peripheral edema[  ];  or itching[  ]; Musculosketetal: myalgias[  ];   joint swelling[  ];  joint erythema[  ];  joint pain[  ];  back pain[  ];  Heme/Lymph: bruising[  ];  bleeding[  ];  anemia[  ];  Neuro: TIA[  ];  headaches[  ];  stroke[  ];  vertigo[  ];  seizures[  ];   paresthesias[  ];  difficulty walking[  ];  Psych:depression[ x  ]; anxiety[  ];  Endocrine: diabetes[ x ];  thyroid dysfunction[ x ];  Immunizations: Flu [  ]; Pneumococcal[  ];  Other:  Physical Exam: BP (!) 146/61   Pulse 71   Temp 97.8 F (36.6 C) (Oral)   Resp 18   Ht 5\' 1"  (1.549 m)   Wt 145 lb (65.8 kg)   LMP  (LMP Unknown)   SpO2 97%   BMI 27.40 kg/m    General appearance: sleepy, no distress Resp: clear to auscultation bilaterally Cardio: regular rate and rhythm, S1, S2 normal, no murmur, click, rub or gallop GI: soft, non-tender; bowel sounds normal; no masses,  no organomegaly Extremities: extremities normal, atraumatic, no cyanosis or edema  Diagnostic Studies & Laboratory data:  Cardiac Cath [id number]:  LV end diastolic pressure is normal.  There is moderate left ventricular systolic dysfunction.  Ramus lesion, 80 %stenosed.  Ost Ramus lesion, 70 %stenosed.  Ost LAD lesion, 80 %stenosed.  Prox LAD to Mid LAD lesion, 50 %stenosed.  Mid LAD lesion, 95 %stenosed.  Dist LAD lesion, 30 %stenosed.  Ost Cx lesion, 85 %stenosed.  Prox Cx lesion, 95 %stenosed.  Ost RCA lesion, 95 %stenosed.  Prox RCA lesion, 95 %stenosed.  Mid RCA lesion, 60 %stenosed.   Moderate LV dysfunction with an ejection fraction of 35-40% with a focal area of upper mid anterolateral hypocontractility and mid to basal inferior hypokinesis.  Severely calcified coronary arteries with severe multivessel CAD with 80% ostial LAD stenosis followed by diffuse narrowing of 50%.  Prior to a 95% proximal to mid stenosis with 30% mid distal stenosis; 70% ostial 80% proximal ramus intermediate stenosis; 80% proximal followed by 95% left circumflex stenosis; an ostial 95% RCA stenosis  followed by proximal 95% and mid 60% stenoses.  RECOMMENDATION: Surgical consultation for CABG revascularization surgery.    No recent Echocardiogram to view.    Recent Radiology Findings:   No results found.    Recent Lab Findings: Lab Results  Component Value Date   WBC 5.6 10/31/2015   HGB 12.8 10/31/2015   HCT 36.7 10/31/2015   PLT 212 10/31/2015   GLUCOSE 171 (H) 10/31/2015   CHOL 130 10/31/2015   TRIG 109 10/31/2015   HDL 52 10/31/2015   LDLCALC 56 10/31/2015   ALT 12 10/31/2015   AST 14 10/31/2015   NA 141 10/31/2015   K 3.3 (L) 11/15/2015   CL 106 10/31/2015   CREATININE 0.80 10/31/2015   BUN 8 10/31/2015   CO2 22 10/31/2015   TSH 0.77 10/31/2015   INR 1.01 11/15/2015   HGBA1C 9.1 (H) 10/31/2015      Assessment / Plan:   Surgeon to review cardiac cath report. Possible revascularization tomorrow with Dr. Roxan Hockey.   I  spent 30 minutes counseling the patient face to face and 50% or more the  time was spent in counseling and coordination of care. The total time spent in the appointment was 60 minutes.   Nicholes Rough, PA-C 11/15/2015 1:57 PM   Patient seen and examined, agree with above  59 yo woman who presents with unstable angina. At cath she has severe 3 vessel CAD with moderate LV dysfunction (EF35%). CABG is indicated for survival benefit and relief of symptoms. She also has bilateral carotid bruits but no hemodynamically significant ICA stenosis on duplex.  I discussed with the patient the general nature of the procedure, the need for general anesthesia, the use of cardiopulmonary bypass, and the incisions to be used  with Mrs. Brayman and her son. We discussed the expected hospital stay, overall recovery and short and long term outcomes. I informed them of the indications, risks, benefits and alternatives. They understand the risks include, but are not limited to death, stroke, MI, DVT/PE, bleeding, possible need for transfusion, infections, cardiac  arrhythmias, as well as other organ system dysfunction including respiratory, renal, or GI complications. She is at increased risk for stroke due to age, aortic calcification. Will get CT chest (no contrast) to assess ascending aorta and make sure there is sufficient room for cannulation, proximals, etc.  She understands and accepts the risks and agrees to proceed. Her son is also in agreement.  Plan  CT chest tonight  CABG in AM  Gavynn Duvall C. Roxan Hockey, MD [facility] [phone]

## 2015-11-16 NOTE — Progress Notes (Signed)
Patient ABG outside of protocol limits (pH 7.27), and not following commands appropriately.  D/W RN and will place patient back on support at this time.

## 2015-11-16 NOTE — Interval H&P Note (Signed)
History and Physical Interval Note:  CT chest shows extensive ascending aortic calcification. Unlikely to be able to clamp the aorta. Will assess in OR off pump v on pump beating heart.  11/16/2015 8:03 AM  [name] [name]  has presented today for surgery, with the diagnosis of CAD  The various methods of treatment have been discussed with the patient and family. After consideration of risks, benefits and other options for treatment, the patient has consented to  Procedure(s): CORONARY ARTERY BYPASS GRAFTING (CABG) (N/A) TRANSESOPHAGEAL ECHOCARDIOGRAM (TEE) (N/A) as a surgical intervention .  The patient's history has been reviewed, patient examined, no change in status, stable for surgery.  I have reviewed the patient's chart and labs.  Questions were answered to the patient's satisfaction.     Melrose Nakayama

## 2015-11-16 NOTE — Anesthesia Procedure Notes (Signed)
Procedure Name: Intubation Date/Time: 11/16/2015 8:26 AM Performed by: Garrison Columbus T Pre-anesthesia Checklist: Patient identified, Emergency Drugs available, Suction available and Patient being monitored Patient Re-evaluated:Patient Re-evaluated prior to inductionOxygen Delivery Method: Circle System Utilized Preoxygenation: Pre-oxygenation with 100% oxygen Intubation Type: IV induction Ventilation: Mask ventilation without difficulty and Oral airway inserted - appropriate to patient size Laryngoscope Size: Sabra Heck and 2 Grade View: Grade I Tube type: Oral Tube size: 8.0 mm Number of attempts: 1 Airway Equipment and Method: Stylet and Oral airway Placement Confirmation: ETT inserted through vocal cords under direct vision,  positive ETCO2 and breath sounds checked- equal and bilateral Secured at: 22 cm Tube secured with: Tape Dental Injury: Teeth and Oropharynx as per pre-operative assessment

## 2015-11-16 NOTE — Transfer of Care (Signed)
Immediate Anesthesia Transfer of Care Note  Patient: [name] [name]  Procedure(s) Performed: Procedure(s): TRANSESOPHAGEAL ECHOCARDIOGRAM (TEE) (N/A) OFF PUMP CORONARY ARTERY BYPASS GRAFTING (CABG) x FOUR, USING LEFT MAMMARY ARTERY  AND ENDOSCOPICALLY HARVESTED RIGHT GREATER SAPHENOUS VEIN.  Patient Location: SICU  Anesthesia Type:General  Level of Consciousness: sedated, unresponsive and Patient remains intubated per anesthesia plan  Airway & Oxygen Therapy: Patient remains intubated per anesthesia plan and Patient placed on Ventilator (see vital sign flow sheet for setting)  Post-op Assessment: Report given to RN and Post -op Vital signs reviewed and stable  Post vital signs: Reviewed and stable  Last Vitals:  Vitals:   11/15/15 2223 11/16/15 0429  BP: 138/64 (!) 109/53  Pulse: 77 79  Resp: 15 13  Temp: 37 C 36.9 C    Last Pain:  Vitals:   11/16/15 0429  TempSrc: Oral  PainSc:       Patients Stated Pain Goal: 0 ([phone] [id number])  Complications: No apparent anesthesia complications

## 2015-11-16 NOTE — Progress Notes (Signed)
CT surgery p.m. Rounds  Status post off-pump CABG 4 Patient now warm and ready to start vent wean Opens eyes to stimulation Hemodynamic stable Minimal chest tube drainage Oxygenation normal

## 2015-11-16 NOTE — Progress Notes (Signed)
  Echocardiogram Echocardiogram Transesophageal has been performed.  Bobbye Charleston 11/16/2015, 9:14 AM

## 2015-11-16 NOTE — OR Nursing (Signed)
13:19 - 45 minute call to SICU nurse

## 2015-11-16 NOTE — Anesthesia Procedure Notes (Signed)
Central Venous Catheter Insertion Performed by: anesthesiologist Patient location: Pre-op. Preanesthetic checklist: patient identified, IV checked, site marked, risks and benefits discussed, surgical consent, monitors and equipment checked, pre-op evaluation, timeout performed and anesthesia consent PA cath was placed.Swan type and PA catheter depth:thermodilation and 44PA Cath depth:44 Procedure performed without using ultrasound guided technique. Attempts: 1 Patient tolerated the procedure well with no immediate complications.

## 2015-11-16 NOTE — Progress Notes (Signed)
RT increased FIO2 to 60% due to ABG results. RN aware.

## 2015-11-17 ENCOUNTER — Encounter (HOSPITAL_COMMUNITY): Payer: Self-pay | Admitting: Thoracic Surgery (Cardiothoracic Vascular Surgery)

## 2015-11-17 ENCOUNTER — Inpatient Hospital Stay (HOSPITAL_COMMUNITY): Payer: Medicare Other

## 2015-11-17 DIAGNOSIS — Z951 Presence of aortocoronary bypass graft: Secondary | ICD-10-CM

## 2015-11-17 LAB — POCT I-STAT 3, ART BLOOD GAS (G3+)
Acid-base deficit: 3 mmol/L — ABNORMAL HIGH (ref 0.0–2.0)
Acid-base deficit: 4 mmol/L — ABNORMAL HIGH (ref 0.0–2.0)
Acid-base deficit: 4 mmol/L — ABNORMAL HIGH (ref 0.0–2.0)
Acid-base deficit: 5 mmol/L — ABNORMAL HIGH (ref 0.0–2.0)
Acid-base deficit: 9 mmol/L — ABNORMAL HIGH (ref 0.0–2.0)
Bicarbonate: 17.3 mmol/L — ABNORMAL LOW (ref 20.0–28.0)
Bicarbonate: 20.4 mmol/L (ref 20.0–28.0)
Bicarbonate: 21.1 mmol/L (ref 20.0–28.0)
Bicarbonate: 21.3 mmol/L (ref 20.0–28.0)
Bicarbonate: 22.1 mmol/L (ref 20.0–28.0)
O2 Saturation: 100 %
O2 Saturation: 95 %
O2 Saturation: 95 %
O2 Saturation: 95 %
O2 Saturation: 96 %
Patient temperature: 37.5
Patient temperature: 37.6
Patient temperature: 37.7
Patient temperature: 37.7
TCO2: 18 mmol/L (ref 0–100)
TCO2: 21 mmol/L (ref 0–100)
TCO2: 22 mmol/L (ref 0–100)
TCO2: 22 mmol/L (ref 0–100)
TCO2: 23 mmol/L (ref 0–100)
pCO2 arterial: 37.6 mmHg (ref 32.0–48.0)
pCO2 arterial: 39 mmHg (ref 32.0–48.0)
pCO2 arterial: 39.1 mmHg (ref 32.0–48.0)
pCO2 arterial: 39.5 mmHg (ref 32.0–48.0)
pCO2 arterial: 39.7 mmHg (ref 32.0–48.0)
pH, Arterial: 7.258 — ABNORMAL LOW (ref 7.350–7.450)
pH, Arterial: 7.337 — ABNORMAL LOW (ref 7.350–7.450)
pH, Arterial: 7.345 — ABNORMAL LOW (ref 7.350–7.450)
pH, Arterial: 7.348 — ABNORMAL LOW (ref 7.350–7.450)
pH, Arterial: 7.357 (ref 7.350–7.450)
pO2, Arterial: 396 mmHg — ABNORMAL HIGH (ref 83.0–108.0)
pO2, Arterial: 80 mmHg — ABNORMAL LOW (ref 83.0–108.0)
pO2, Arterial: 84 mmHg (ref 83.0–108.0)
pO2, Arterial: 87 mmHg (ref 83.0–108.0)
pO2, Arterial: 88 mmHg (ref 83.0–108.0)

## 2015-11-17 LAB — POCT I-STAT, CHEM 8
BUN: 5 mg/dL — ABNORMAL LOW (ref 6–20)
BUN: 5 mg/dL — ABNORMAL LOW (ref 6–20)
BUN: 5 mg/dL — ABNORMAL LOW (ref 6–20)
BUN: 5 mg/dL — ABNORMAL LOW (ref 6–20)
BUN: 4 mg/dL — ABNORMAL LOW (ref 6–20)
BUN: 4 mg/dL — ABNORMAL LOW (ref 6–20)
BUN: 3 mg/dL — ABNORMAL LOW (ref 6–20)
BUN: 5 mg/dL — ABNORMAL LOW (ref 6–20)
Calcium, Ion: 1.19 mmol/L (ref 1.15–1.40)
Calcium, Ion: 1.21 mmol/L (ref 1.15–1.40)
Calcium, Ion: 1.15 mmol/L (ref 1.15–1.40)
Calcium, Ion: 1.21 mmol/L (ref 1.15–1.40)
Calcium, Ion: 1.17 mmol/L (ref 1.15–1.40)
Calcium, Ion: 1.16 mmol/L (ref 1.15–1.40)
Calcium, Ion: 1.13 mmol/L — ABNORMAL LOW (ref 1.15–1.40)
Calcium, Ion: 1.11 mmol/L — ABNORMAL LOW (ref 1.15–1.40)
Chloride: 109 mmol/L (ref 101–111)
Chloride: 111 mmol/L (ref 101–111)
Chloride: 110 mmol/L (ref 101–111)
Chloride: 112 mmol/L — ABNORMAL HIGH (ref 101–111)
Chloride: 111 mmol/L (ref 101–111)
Chloride: 110 mmol/L (ref 101–111)
Chloride: 114 mmol/L — ABNORMAL HIGH (ref 101–111)
Chloride: 105 mmol/L (ref 101–111)
Creatinine, Ser: 0.3 mg/dL — ABNORMAL LOW (ref 0.44–1.00)
Creatinine, Ser: 0.2 mg/dL — ABNORMAL LOW (ref 0.44–1.00)
Creatinine, Ser: 0.2 mg/dL — ABNORMAL LOW (ref 0.44–1.00)
Creatinine, Ser: <0.2 mg/dL — ABNORMAL LOW (ref 0.44–1.00)
Creatinine, Ser: <0.2 mg/dL — ABNORMAL LOW (ref 0.44–1.00)
Creatinine, Ser: 0.2 mg/dL — ABNORMAL LOW (ref 0.44–1.00)
Creatinine, Ser: 0.2 mg/dL — ABNORMAL LOW (ref 0.44–1.00)
Creatinine, Ser: 0.5 mg/dL (ref 0.44–1.00)
Glucose, Bld: 133 mg/dL — ABNORMAL HIGH (ref 65–99)
Glucose, Bld: 127 mg/dL — ABNORMAL HIGH (ref 65–99)
Glucose, Bld: 114 mg/dL — ABNORMAL HIGH (ref 65–99)
Glucose, Bld: 118 mg/dL — ABNORMAL HIGH (ref 65–99)
Glucose, Bld: 117 mg/dL — ABNORMAL HIGH (ref 65–99)
Glucose, Bld: 110 mg/dL — ABNORMAL HIGH (ref 65–99)
Glucose, Bld: 108 mg/dL — ABNORMAL HIGH (ref 65–99)
Glucose, Bld: 111 mg/dL — ABNORMAL HIGH (ref 65–99)
HCT: 29 % — ABNORMAL LOW (ref 36.0–46.0)
HCT: 28 % — ABNORMAL LOW (ref 36.0–46.0)
HCT: 28 % — ABNORMAL LOW (ref 36.0–46.0)
HCT: 29 % — ABNORMAL LOW (ref 36.0–46.0)
HCT: 26 % — ABNORMAL LOW (ref 36.0–46.0)
HCT: 24 % — ABNORMAL LOW (ref 36.0–46.0)
HCT: 20 % — ABNORMAL LOW (ref 36.0–46.0)
HCT: 24 % — ABNORMAL LOW (ref 36.0–46.0)
Hemoglobin: 9.9 g/dL — ABNORMAL LOW (ref 12.0–15.0)
Hemoglobin: 9.5 g/dL — ABNORMAL LOW (ref 12.0–15.0)
Hemoglobin: 9.5 g/dL — ABNORMAL LOW (ref 12.0–15.0)
Hemoglobin: 9.9 g/dL — ABNORMAL LOW (ref 12.0–15.0)
Hemoglobin: 8.8 g/dL — ABNORMAL LOW (ref 12.0–15.0)
Hemoglobin: 8.2 g/dL — ABNORMAL LOW (ref 12.0–15.0)
Hemoglobin: 6.8 g/dL — CL (ref 12.0–15.0)
Hemoglobin: 8.2 g/dL — ABNORMAL LOW (ref 12.0–15.0)
Potassium: 3.9 mmol/L (ref 3.5–5.1)
Potassium: 3.8 mmol/L (ref 3.5–5.1)
Potassium: 3.8 mmol/L (ref 3.5–5.1)
Potassium: 3.8 mmol/L (ref 3.5–5.1)
Potassium: 3.6 mmol/L (ref 3.5–5.1)
Potassium: 3.3 mmol/L — ABNORMAL LOW (ref 3.5–5.1)
Potassium: 3.2 mmol/L — ABNORMAL LOW (ref 3.5–5.1)
Potassium: 4 mmol/L (ref 3.5–5.1)
Sodium: 143 mmol/L (ref 135–145)
Sodium: 143 mmol/L (ref 135–145)
Sodium: 144 mmol/L (ref 135–145)
Sodium: 145 mmol/L (ref 135–145)
Sodium: 144 mmol/L (ref 135–145)
Sodium: 146 mmol/L — ABNORMAL HIGH (ref 135–145)
Sodium: 146 mmol/L — ABNORMAL HIGH (ref 135–145)
Sodium: 141 mmol/L (ref 135–145)
TCO2: 23 mmol/L (ref 0–100)
TCO2: 24 mmol/L (ref 0–100)
TCO2: 24 mmol/L (ref 0–100)
TCO2: 25 mmol/L (ref 0–100)
TCO2: 24 mmol/L (ref 0–100)
TCO2: 23 mmol/L (ref 0–100)
TCO2: 21 mmol/L (ref 0–100)
TCO2: 22 mmol/L (ref 0–100)

## 2015-11-17 LAB — CBC
HCT: 29 % — ABNORMAL LOW (ref 36.0–46.0)
HCT: 24.1 % — ABNORMAL LOW (ref 36.0–46.0)
Hemoglobin: 9.9 g/dL — ABNORMAL LOW (ref 12.0–15.0)
Hemoglobin: 8.3 g/dL — ABNORMAL LOW (ref 12.0–15.0)
MCH: 28.2 pg (ref 26.0–34.0)
MCH: 28.7 pg (ref 26.0–34.0)
MCHC: 34.1 g/dL (ref 30.0–36.0)
MCHC: 34.4 g/dL (ref 30.0–36.0)
MCV: 82.6 fL (ref 78.0–100.0)
MCV: 83.4 fL (ref 78.0–100.0)
Platelets: 79 10*3/uL — ABNORMAL LOW (ref 150–400)
Platelets: 63 10*3/uL — ABNORMAL LOW (ref 150–400)
RBC: 3.51 MIL/uL — ABNORMAL LOW (ref 3.87–5.11)
RBC: 2.89 MIL/uL — ABNORMAL LOW (ref 3.87–5.11)
RDW: 15.3 % (ref 11.5–15.5)
RDW: 15.7 % — ABNORMAL HIGH (ref 11.5–15.5)
WBC: 27.5 10*3/uL — ABNORMAL HIGH (ref 4.0–10.5)
WBC: 22.3 10*3/uL — ABNORMAL HIGH (ref 4.0–10.5)

## 2015-11-17 LAB — MAGNESIUM
Magnesium: 2.2 mg/dL (ref 1.7–2.4)
Magnesium: 1.9 mg/dL (ref 1.7–2.4)

## 2015-11-17 LAB — GLUCOSE, CAPILLARY
Glucose-Capillary: 100 mg/dL — ABNORMAL HIGH (ref 65–99)
Glucose-Capillary: 102 mg/dL — ABNORMAL HIGH (ref 65–99)
Glucose-Capillary: 103 mg/dL — ABNORMAL HIGH (ref 65–99)
Glucose-Capillary: 107 mg/dL — ABNORMAL HIGH (ref 65–99)
Glucose-Capillary: 109 mg/dL — ABNORMAL HIGH (ref 65–99)
Glucose-Capillary: 118 mg/dL — ABNORMAL HIGH (ref 65–99)
Glucose-Capillary: 120 mg/dL — ABNORMAL HIGH (ref 65–99)
Glucose-Capillary: 122 mg/dL — ABNORMAL HIGH (ref 65–99)
Glucose-Capillary: 127 mg/dL — ABNORMAL HIGH (ref 65–99)
Glucose-Capillary: 130 mg/dL — ABNORMAL HIGH (ref 65–99)
Glucose-Capillary: 130 mg/dL — ABNORMAL HIGH (ref 65–99)
Glucose-Capillary: 86 mg/dL (ref 65–99)
Glucose-Capillary: 87 mg/dL (ref 65–99)
Glucose-Capillary: 90 mg/dL (ref 65–99)
Glucose-Capillary: 92 mg/dL (ref 65–99)
Glucose-Capillary: 95 mg/dL (ref 65–99)
Glucose-Capillary: 97 mg/dL (ref 65–99)

## 2015-11-17 LAB — BASIC METABOLIC PANEL
Anion gap: 8 (ref 5–15)
BUN: 6 mg/dL (ref 6–20)
CO2: 21 mmol/L — ABNORMAL LOW (ref 22–32)
Calcium: 7.9 mg/dL — ABNORMAL LOW (ref 8.9–10.3)
Chloride: 113 mmol/L — ABNORMAL HIGH (ref 101–111)
Creatinine, Ser: 0.56 mg/dL (ref 0.44–1.00)
GFR calc Af Amer: >60 mL/min (ref >60)
GFR calc non Af Amer: >60 mL/min (ref >60)
Glucose, Bld: 104 mg/dL — ABNORMAL HIGH (ref 65–99)
Potassium: 3.6 mmol/L (ref 3.5–5.1)
Sodium: 142 mmol/L (ref 135–145)

## 2015-11-17 LAB — CREATININE, SERUM
Creatinine, Ser: 0.75 mg/dL (ref 0.44–1.00)
GFR calc Af Amer: >60 mL/min (ref >60)
GFR calc non Af Amer: >60 mL/min (ref >60)

## 2015-11-17 MED ORDER — INSULIN ASPART 100 UNIT/ML ~~LOC~~ SOLN
0.0000 [IU] | SUBCUTANEOUS | Status: DC
  Administered 2015-11-18: 2 [IU] via SUBCUTANEOUS

## 2015-11-17 MED ORDER — DOPAMINE-DEXTROSE 3.2-5 MG/ML-% IV SOLN
0.0000 ug/kg/min | INTRAVENOUS | Status: DC
Start: 2015-11-17 — End: 2015-11-17

## 2015-11-17 MED ORDER — INSULIN DETEMIR 100 UNIT/ML ~~LOC~~ SOLN
20.0000 [IU] | Freq: Two times a day (BID) | SUBCUTANEOUS | Status: DC
  Administered 2015-11-17: 20 [IU] via SUBCUTANEOUS
  Filled 2015-11-17 (×4): qty 0.2

## 2015-11-17 MED ORDER — POTASSIUM CHLORIDE 10 MEQ/50ML IV SOLN
10.0000 meq | INTRAVENOUS | Status: AC | PRN
  Administered 2015-11-17 (×3): 10 meq via INTRAVENOUS

## 2015-11-17 MED ORDER — ALBUMIN HUMAN 5 % IV SOLN
12.5000 g | Freq: Once | INTRAVENOUS | Status: AC
  Administered 2015-11-17: 12.5 g via INTRAVENOUS
  Filled 2015-11-17: qty 250

## 2015-11-17 MED ORDER — FUROSEMIDE 10 MG/ML IJ SOLN
40.0000 mg | Freq: Once | INTRAMUSCULAR | Status: AC
  Administered 2015-11-17: 40 mg via INTRAVENOUS
  Filled 2015-11-17: qty 4

## 2015-11-17 MED ORDER — DOPAMINE-DEXTROSE 3.2-5 MG/ML-% IV SOLN
0.0000 ug/kg/min | INTRAVENOUS | Status: DC
  Filled 2015-11-17: qty 250

## 2015-11-17 MED ORDER — OXYCODONE HCL 5 MG PO TABS: 5.0000 mg | ORAL_TABLET | Freq: Four times a day (QID) | ORAL | Status: DC | PRN

## 2015-11-17 MED ORDER — INSULIN DETEMIR 100 UNIT/ML ~~LOC~~ SOLN: 20.0000 [IU] | Freq: Two times a day (BID) | SUBCUTANEOUS | Status: DC

## 2015-11-17 MED ORDER — TRAMADOL HCL 50 MG PO TABS
50.0000 mg | ORAL_TABLET | Freq: Four times a day (QID) | ORAL | Status: DC | PRN
  Administered 2015-11-17 – 2015-11-20 (×4): 50 mg via ORAL
  Filled 2015-11-17 (×6): qty 1

## 2015-11-17 MED FILL — Cefuroxime Sodium For Inj 750 MG: INTRAMUSCULAR | Qty: 750 | Status: AC

## 2015-11-17 NOTE — Progress Notes (Signed)
1 Day Post-Op Procedure(s) (LRB): TRANSESOPHAGEAL ECHOCARDIOGRAM (TEE) (N/A) OFF PUMP CORONARY ARTERY BYPASS GRAFTING (CABG) x FOUR, USING LEFT MAMMARY ARTERY  AND ENDOSCOPICALLY HARVESTED RIGHT GREATER SAPHENOUS VEIN. Subjective: Lethargic but arouses easily  Objective: Vital signs in last 24 hours: Temp:  [92.1 F (33.4 C)-99.9 F (37.7 C)] 99.5 F (37.5 C) (11/10 0700) Pulse Rate:  [82-128] 111 (11/10 0700) Cardiac Rhythm: Sinus tachycardia (11/10 0030) Resp:  [11-23] 17 (11/10 0700) BP: (78-142)/(40-94) 109/56 (11/10 0700) SpO2:  [95 %-100 %] 98 % (11/10 0700) Arterial Line BP: (55-166)/(45-79) 125/51 (11/10 0700) FiO2 (%):  [40 %-60 %] 40 % (11/10 0101) Weight:  [154 lb 12.2 oz (70.2 kg)] 154 lb 12.2 oz (70.2 kg) (11/10 0545)  Hemodynamic parameters for last 24 hours: PAP: (21-42)/(9-31) 23/13 CO:  [2.6 L/min-5 L/min] 4.7 L/min CI:  [1.6 L/min/m2-3.1 L/min/m2] 2.9 L/min/m2  Intake/Output from previous day: 11/09 0701 - 11/10 0700 In: 7761.9 [I.V.:5155.9; Blood:1116; NG/GT:90; IV Piggyback:1400] Out: 3670 [Urine:2485; Emesis/NG output:30; Blood:800; Chest Tube:355] Intake/Output this shift: Total I/O In: 50 [IV Piggyback:50] Out: -   General appearance: cooperative and no distress Neurologic: lethargic but arouses, oriented to person,place, no focal motor deficit Heart: tachy, regular Lungs: diminished breath sounds bibasilar Abdomen: soft, nontender, +BS  Lab Results:  Recent Labs  11/16/15 2000 11/16/15 2003 11/17/15 0400  WBC 29.0*  --  27.5*  HGB 10.7* 10.9* 9.9*  HCT 32.1* 32.0* 29.0*  PLT 96*  --  79*   BMET:  Recent Labs  11/16/15 0318  11/16/15 2003 11/17/15 0400  NA 139  < > 144 142  K 3.7  < > 4.4 3.6  CL 112*  --  111 113*  CO2 19*  --   --  21*  GLUCOSE 137*  < > 136* 104*  BUN 8  --  5* 6  CREATININE 0.67  < > 0.40* 0.56  CALCIUM 8.3*  --   --  7.9*  < > = values in this interval not displayed.  PT/INR:  Recent Labs   11/16/15 1500  LABPROT 18.7*  INR 1.55   ABG    Component Value Date/Time   PHART 7.345 (L) 11/17/2015 0323   HCO3 20.4 11/17/2015 0323   TCO2 21 11/17/2015 0323   ACIDBASEDEF 5.0 (H) 11/17/2015 0323   O2SAT 95.0 11/17/2015 0323   CBG (last 3)   Recent Labs  11/17/15 0322 11/17/15 0428 11/17/15 0540  GLUCAP 109* 122* 130*    Assessment/Plan: S/P Procedure(s) (LRB): TRANSESOPHAGEAL ECHOCARDIOGRAM (TEE) (N/A) OFF PUMP CORONARY ARTERY BYPASS GRAFTING (CABG) x FOUR, USING LEFT MAMMARY ARTERY  AND ENDOSCOPICALLY HARVESTED RIGHT GREATER SAPHENOUS VEIN. -  CV- mildly tachy, good cardiac output- wean dopamine  Dc swan and A line  ASA, betablocker, statin  NEURO- lethargic but no focal deficit. Not unusual in the elderly in the early postop phase. No focal deficit  Minimize sedation, follow  RESP- poor effort with IS  RENAL- creatinine normal  Volume overload secondary to 3rd spacing- diurese  ENDO- CBG controlled with insulin drip. Transition to levemir + SSI  Leukocytosis- WBC= 27 K, suspect SIRS type reaction, follow  Anemia-Hct 29, received 2 units PRBC in OR  Thrombocytopenia- PLT= 79K, down slightly from last night  No bleeding- follow  No heparin or lovenox for now  DC CT   LOS: 2 days    Melrose Nakayama 11/17/2015

## 2015-11-17 NOTE — Progress Notes (Signed)
      [address]       [place] [address]             [phone]      Sleeping currently  Has been mildly combative and uncooperative with nursing  She is more alert and answers questions appropriately  BP (!) 95/53   Pulse (!) 108   Temp 98.2 F (36.8 C) (Oral)   Resp 20   Ht 5\' 1"  (1.549 m)   Wt 154 lb 12.2 oz (70.2 kg)   LMP  (LMP Unknown)   SpO2 100%   BMI 29.24 kg/m    Intake/Output Summary (Last 24 hours) at 11/17/15 1724 Last data filed at 11/17/15 1600  Gross per 24 hour  Intake          2286.61 ml  Output             2745 ml  Net          -458.39 ml   K= 4.0, creatinine= 0.5  Hct= 24  Gerard Cantara C. Roxan Hockey, MD [facility] [phone]

## 2015-11-17 NOTE — Progress Notes (Signed)
Patient tolerating PS wean well.  Following commands and able to lift head from bed.  ABG within protocol limits.  VC: 500 (7ml/kg = 480) NIF: -26

## 2015-11-17 NOTE — Plan of Care (Signed)
Problem: Respiratory: Goal: Respiratory status will improve Outcome: Progressing O2 weaning Goal: Ability to tolerate decreased levels of ventilator support will improve Outcome: Completed/Met Date Met: 11/17/15 Pt extubated 11/9  Problem: Pain Management: Goal: Pain level will decrease Outcome: Progressing Pt taking po pain medications

## 2015-11-17 NOTE — Procedures (Signed)
Extubation Procedure Note  Patient Details:   Name: [name] [name] DOB: [birth year] MRN: [id number]   Airway Documentation:  Airway 8 mm (Active)  Secured at (cm) 22 cm 11/16/2015 11:20 PM  Measured From Lips 11/16/2015 11:20 PM  Secured Location Right 11/16/2015 11:20 PM  Secured By Rana Snare Tape 11/16/2015 11:20 PM  Cuff Pressure (cm H2O) 24 cm H2O 11/16/2015  4:31 PM  Site Condition Dry 11/16/2015 11:20 PM    Evaluation  O2 sats: stable throughout Complications: No apparent complications Patient did tolerate procedure well. Bilateral Breath Sounds: Clear, Diminished   Yes   Rapid wean guidelines met.  Cuff leak positive prior to extubation.  Oral and endotracheal suctioning preformed.  Extubated to 4L [place].  Able to cough and speak.  RN to instruct IS.  Rayne Du Charly Hunton 11/17/2015, 1:37 AM

## 2015-11-17 NOTE — Op Note (Signed)
[name] [name]                ACCOUNT NO.:  [id number]  MEDICAL RECORD NO.:  [id number]  LOCATION:  2S01C                        FACILITY:  [facility]  PHYSICIAN:  Revonda Standard. Roxan Hockey, M.D.DATE OF BIRTH:  Sep 01, 1931  DATE OF PROCEDURE:  11/16/2015 DATE OF DISCHARGE:                              OPERATIVE REPORT   PREOPERATIVE DIAGNOSIS:  Severe three-vessel coronary artery disease.  POSTOPERATIVE DIAGNOSES:  Severe three-vessel coronary artery disease, severe calcific atherosclerotic disease of the ascending aorta.  PROCEDURE:   Median sternotomy, Off pump coronary artery bypass grafting X 4  Left internal mammary artery to left anterior descending,  Saphenous vein graft to ramus intermedius,  Saphenous vein graft to obtuse marginal 1, and  Saphenous vein graft to posterior descending Endoscopic vein harvest right leg.  SURGEON:  Revonda Standard. Roxan Hockey, M.D.  ASSISTANT:  Lars Pinks, PA  ANESTHESIA:  General.  FINDINGS:  Transesophageal echocardiography showed ejection fraction approximately 30% with anterior hypokinesis.  Post procedure TEE- no significant change.  She did have mild aortic and mitral insufficiency. Sternal osteoporosis.  OM fair quality target. Remaining targets were good quality.  Mammary and saphenous vein good quality.  Severe calcific atherosclerotic disease of the ascending aorta.  CLINICAL NOTE:  [name] [name] is an 82 year old woman with multiple cardiac risk factors, who presented for evaluation of unstable angina. She has been taking up to 4 nitroglycerins a day for chest pain.  She underwent cardiac catheterization which showed severe three-vessel coronary artery disease and was referred for coronary artery bypass grafting.  There was evidence of calcification of the ascending aorta on the catheterization images and a CT scan was done which confirmed concentric eggshell type calcification in the ascending aorta.  The finding was further  confirmed with intraoperative examination and transesophageal echocardiography.  The patient was advised to undergo coronary artery bypass grafting.  The indications, risks, benefits, and alternatives were discussed in detail with the patient. She was relatively high risk due to her advanced age and aortic atherosclerosis.  She understood and accepted the risks and agreed to proceed.  OPERATIVE NOTE:  [name] [name] was brought to the preoperative holding area on November 16, 2015.  Dr. Annye Asa placed a Swan-Ganz catheter and an arterial blood pressure monitoring line.  She was taken to the operating room, anesthetized, and intubated.  Transesophageal echocardiography was performed.  Findings as previously noted.  Please see Dr. Marisue Brooklyn note for full details. Intravenous antibiotics were administered. A Foley catheter was placed. The chest, abdomen, and legs were prepped and draped in usual sterile fashion.  A median sternotomy was performed.  Sternal osteoporosis was present. Left internal mammary artery was harvested using standard technique. Simultaneously, an incision was made in the medial aspect of the right leg at the level of the knee.  The greater saphenous vein was harvested from the mid calf to the groin.  The saphenous vein was of good quality for the patient's age. The mammary artery was relatively small but had good flow.  It did accept a 1.5-mm probe.  2000 units of heparin was administered during the vessel harvest.  An additional 10,000 units of heparin was administered after harvesting the conduits. A  sternal retractor was placed and opened gently over time.  The ascending aorta was inspected and this was done together with TEE. There were not adequate sites for aortic cross-clamping or full cardiopulmonary bypass, but there were adequate sites for proximal vein graft placement.  Decision was made to proceed with off-pump grafting. The Maquet off-pump system was  used with an apical suction device and a stabilizer.  The conduits were prepared.  The inferior wall the heart was exposed.  The posterior descending coronary artery was identified.  The stabilizer was placed.  Silastic tapes were placed proximal and distal to the site selected for the anastomosis.  An arteriotomy was made.  Traction was placed on the tapes.  The arteriotomy was lengthened.  This was a 1.5 mm vessel.  The vein graft was anastomosed end-to-side with a running 7-0 Prolene suture.  At their completion, all anastomoses were probed proximally and distally to ensure patency before tying the suture.  There was good hemostasis at the anastomosis.  The heart was allowed to rest back in its normal position.  A 3.8 mm heartstring III device then was deployed in the ascending aorta and the proximal anastomosis was performed with a running 6-0 Prolene suture. Air was aspirated from the graft and the clamp was removed from the graft.  It should be noted that the patient was in steep Trendelenburg position during the distal anastomoses.  The right pleura was opened.  The heart was then elevated exposing the obtuse marginal branches.  The patient did not tolerate this well hemodynamically.  #1 silk stay sutures were placed in the pericardium and with traction on those, the patient did tolerate exposure of the ramus intermedius branch.  Silastic tapes were placed proximally and distally.  The stabilizer was placed.  An arteriotomy was made and the vein graft was anastomosed end-to-side with a running 7-0 Prolene suture.  Again, a probe passed easily, and there was good hemostasis at the anastomosis.  The heart was then again allowed to rest in its normal location.  A 3.8 mm heartstring III device was again deployed in the ascending aorta and the proximal anastomosis was performed with a running 6-0 Prolene suture.  Air was aspirated from the graft and the clamp was removed.  The  heart was again elevated exposing the OM branches, 2 were easily visible.  The larger one was grafted although this was slightly less than 1.5 mm in diameter.  The patient did tolerate this well hemodynamically on the second attempt. The vein graft was anastomosed end-to-side with a running 7-0 Prolene suture.  A 1 mm probe did pass distally, and there was good flow through the graft.  The heart was then allowed to rest back in its normal position.  Bulldog clamps were placed proximally and distally on the ramus graft. A venotomy was performed and the proximal anastomosis for the OM graft was brought off end-to-side from the ramus graft with the anastomosis being done with a running 7-0 Prolene suture.  The left internal mammary artery was brought through a window in the pericardium.  The heart was again elevated.  The stabilizer was placed at the LAD.  The LAD was intramyocardial proximally.  It was grafted right where it came back to the epicardial surface.  It was a 1.5 mm vessel at that point.  Silastic tapes were placed proximally and distally and an arteriotomy was made.  There was bleeding.  A 1.5 mm shunt could not be placed satisfactorily.  A second proximal silastic tape was placed closer to the arteriotomy, and this allowed sufficient hemostasis.  The distal end of the mammary was beveled and was anastomosed end-to-side to the LAD with a running 8-0 Prolene suture. At completion of the anastomosis, the bulldog clamp was removed before tying the suture to the area the anastomosis.  The proximal and distal silastic tapes were removed.  The suture then was tied.  There was good hemostasis at the anastomosis.  The mammary pedicle was tacked to the epicardial surface of heart with 6-0 Prolene suture.  The pericardial stay sutures were removed.  All proximal and distal anastomoses were inspected for hemostasis.  Epicardial pacing wires were placed on the right ventricle and right  atrium.  Transesophageal echocardiography was repeated, it was essentially unchanged although there may have been slightly better anterior wall motion per Dr. Marisue Brooklyn assessment.  A test dose of protamine was administered and was well tolerated.  The remainder of the protamine was administered without incident.  The chest was irrigated with warm saline.  Hemostasis was achieved.  The pericardium was not closed.  Left pleural and mediastinal chest tubes were placed through separate subcostal incisions and secured with #1 silk sutures.  KLA/Tencor sternal reinforcement was used due to the sternal osteoporosis.  These were placed on either side of the sternum and secured to the sternum with 9 and 11 mm screws.  The sternum then was closed with a combination of single and double heavy gauge stainless steel wires.  The pectoralis fascia, subcutaneous tissue, and skin were closed in standard fashion.  All sponge, needle, and instrument counts were correct at the end of the procedure.  The patient was taken from the operating room to the Surgical Intensive Care Unit, intubated and in good condition.     Revonda Standard Roxan Hockey, M.D.     SCH/MEDQ  D:  11/16/2015  T:  11/17/2015  Job:  [id number]

## 2015-11-18 ENCOUNTER — Inpatient Hospital Stay (HOSPITAL_COMMUNITY): Payer: Medicare Other

## 2015-11-18 LAB — PREPARE RBC (CROSSMATCH)

## 2015-11-18 LAB — GLUCOSE, CAPILLARY
Glucose-Capillary: 107 mg/dL — ABNORMAL HIGH (ref 65–99)
Glucose-Capillary: 113 mg/dL — ABNORMAL HIGH (ref 65–99)
Glucose-Capillary: 115 mg/dL — ABNORMAL HIGH (ref 65–99)
Glucose-Capillary: 105 mg/dL — ABNORMAL HIGH (ref 65–99)
Glucose-Capillary: 104 mg/dL — ABNORMAL HIGH (ref 65–99)

## 2015-11-18 LAB — BASIC METABOLIC PANEL
Anion gap: 7 (ref 5–15)
BUN: 8 mg/dL (ref 6–20)
CO2: 23 mmol/L (ref 22–32)
Calcium: 7.6 mg/dL — ABNORMAL LOW (ref 8.9–10.3)
Chloride: 109 mmol/L (ref 101–111)
Creatinine, Ser: 0.75 mg/dL (ref 0.44–1.00)
GFR calc Af Amer: >60 mL/min (ref >60)
GFR calc non Af Amer: >60 mL/min (ref >60)
Glucose, Bld: 109 mg/dL — ABNORMAL HIGH (ref 65–99)
Potassium: 4.2 mmol/L (ref 3.5–5.1)
Sodium: 139 mmol/L (ref 135–145)

## 2015-11-18 LAB — CBC
HCT: 24.1 % — ABNORMAL LOW (ref 36.0–46.0)
Hemoglobin: 8.1 g/dL — ABNORMAL LOW (ref 12.0–15.0)
MCH: 28.6 pg (ref 26.0–34.0)
MCHC: 33.6 g/dL (ref 30.0–36.0)
MCV: 85.2 fL (ref 78.0–100.0)
Platelets: 66 10*3/uL — ABNORMAL LOW (ref 150–400)
RBC: 2.83 MIL/uL — ABNORMAL LOW (ref 3.87–5.11)
RDW: 15.9 % — ABNORMAL HIGH (ref 11.5–15.5)
WBC: 19.3 10*3/uL — ABNORMAL HIGH (ref 4.0–10.5)

## 2015-11-18 LAB — HEMOGLOBIN AND HEMATOCRIT, BLOOD
HCT: 28.2 % — ABNORMAL LOW (ref 36.0–46.0)
Hemoglobin: 9.3 g/dL — ABNORMAL LOW (ref 12.0–15.0)

## 2015-11-18 MED ORDER — INSULIN DETEMIR 100 UNIT/ML ~~LOC~~ SOLN
15.0000 [IU] | Freq: Two times a day (BID) | SUBCUTANEOUS | Status: DC
  Administered 2015-11-18 (×2): 15 [IU] via SUBCUTANEOUS
  Filled 2015-11-18 (×4): qty 0.15

## 2015-11-18 MED ORDER — ORAL CARE MOUTH RINSE
15.0000 mL | Freq: Two times a day (BID) | OROMUCOSAL | Status: DC
  Administered 2015-11-18 – 2015-11-24 (×9): 15 mL via OROMUCOSAL

## 2015-11-18 MED ORDER — INSULIN ASPART 100 UNIT/ML ~~LOC~~ SOLN
0.0000 [IU] | Freq: Three times a day (TID) | SUBCUTANEOUS | Status: DC
  Administered 2015-11-23: 5 [IU] via SUBCUTANEOUS
  Administered 2015-11-23: 3 [IU] via SUBCUTANEOUS
  Administered 2015-11-23: 2 [IU] via SUBCUTANEOUS
  Administered 2015-11-24: 3 [IU] via SUBCUTANEOUS
  Administered 2015-11-24 (×2): 2 [IU] via SUBCUTANEOUS
  Administered 2015-11-25: 3 [IU] via SUBCUTANEOUS

## 2015-11-18 MED ORDER — SODIUM CHLORIDE 0.9 % IV SOLN: Freq: Once | INTRAVENOUS | Status: AC

## 2015-11-18 NOTE — Progress Notes (Signed)
      [address]       [place] [address]             [phone]      Alert, denies pain  Ambulated over 100' today  BP 120/65   Pulse (!) 104   Temp 97.6 F (36.4 C) (Oral)   Resp 15   Ht 5\' 1"  (1.549 m)   Wt 152 lb 8.9 oz (69.2 kg)   LMP  (LMP Unknown)   SpO2 97%   BMI 28.83 kg/m    Intake/Output Summary (Last 24 hours) at 11/18/15 1748 Last data filed at 11/18/15 1701  Gross per 24 hour  Intake          1609.53 ml  Output              490 ml  Net          1119.53 ml   Appetite and PO intake remain poor  Remo Lipps C. Roxan Hockey, MD [facility] [phone]

## 2015-11-18 NOTE — Plan of Care (Signed)
Problem: Nutritional: Goal: Risk for body nutrition deficit will decrease Outcome: Not Progressing Pt not interested in eating today, refused even with education and encouragement

## 2015-11-18 NOTE — Progress Notes (Signed)
RT note : Instructed patient with flutter valve use. Patient unable to do it with out assistance. Patient had good effort with non productive cough.

## 2015-11-18 NOTE — Progress Notes (Signed)
2 Days Post-Op Procedure(s) (LRB): TRANSESOPHAGEAL ECHOCARDIOGRAM (TEE) (N/A) OFF PUMP CORONARY ARTERY BYPASS GRAFTING (CABG) x FOUR, USING LEFT MAMMARY ARTERY  AND ENDOSCOPICALLY HARVESTED RIGHT GREATER SAPHENOUS VEIN. Subjective: Up in chair BP dropped when she first got up- dopamine transiently increased, now back down to 2 Still lethargic, but more easily arousable and more conversant  Objective: Vital signs in last 24 hours: Temp:  [97.5 F (36.4 C)-99.4 F (37.4 C)] 98.5 F (36.9 C) (11/11 0733) Pulse Rate:  [68-117] 110 (11/11 0645) Cardiac Rhythm: Sinus tachycardia (11/11 0000) Resp:  [10-37] 13 (11/11 0645) BP: (82-124)/(41-87) 96/48 (11/11 0600) SpO2:  [78 %-100 %] 94 % (11/11 0645) Arterial Line BP: (78-138)/(33-52) 78/35 (11/11 0645) Weight:  [152 lb 8.9 oz (69.2 kg)] 152 lb 8.9 oz (69.2 kg) (11/11 0600)  Hemodynamic parameters for last 24 hours: PAP: (23-33)/(12-19) 23/14 CO:  [4.5 L/min-4.8 L/min] 4.5 L/min CI:  [2.8 L/min/m2-3 L/min/m2] 2.8 L/min/m2  Intake/Output from previous day: 11/10 0701 - 11/11 0700 In: 1211.1 [P.O.:180; I.V.:881.1; IV Piggyback:150] Out: 2175 [Urine:2105; Chest Tube:70] Intake/Output this shift: No intake/output data recorded.  General appearance: cooperative and no distress Neurologic: no focal motor deficit Heart: tachy, regular Lungs: diminished breath sounds bibasilar Abdomen: soft, hypoactive BS  Lab Results:  Recent Labs  11/17/15 1645 11/17/15 1647 11/18/15 0410  WBC 22.3*  --  19.3*  HGB 8.3* 8.2* 8.1*  HCT 24.1* 24.0* 24.1*  PLT 63*  --  66*   BMET:  Recent Labs  11/17/15 0400  11/17/15 1647 11/18/15 0410  NA 142  --  141 139  K 3.6  --  4.0 4.2  CL 113*  --  105 109  CO2 21*  --   --  23  GLUCOSE 104*  --  111* 109*  BUN 6  --  5* 8  CREATININE 0.56  < > 0.50 0.75  CALCIUM 7.9*  --   --  7.6*  < > = values in this interval not displayed.  PT/INR:  Recent Labs  11/16/15 1500  LABPROT 18.7*  INR  1.55   ABG    Component Value Date/Time   PHART 7.345 (L) 11/17/2015 0323   HCO3 20.4 11/17/2015 0323   TCO2 22 11/17/2015 1647   ACIDBASEDEF 5.0 (H) 11/17/2015 0323   O2SAT 95.0 11/17/2015 0323   CBG (last 3)   Recent Labs  11/17/15 2305 11/18/15 0311 11/18/15 0727  GLUCAP 127* 107* 113*    Assessment/Plan: S/P Procedure(s) (LRB): TRANSESOPHAGEAL ECHOCARDIOGRAM (TEE) (N/A) OFF PUMP CORONARY ARTERY BYPASS GRAFTING (CABG) x FOUR, USING LEFT MAMMARY ARTERY  AND ENDOSCOPICALLY HARVESTED RIGHT GREATER SAPHENOUS VEIN. -POD # 2 NEURO- no focal deficits but lethargic, minimal pain meds  CV- BP relatively low. Wean dopamine as tolerated  Weight up but may still be intravascularly dry. Filling pressures were low, hopefully transfusion helps  RESP- bibasilar atelectasis, not effective with IS, will try flutter valve  RENAL- creatinine and electrolytes OK  ENDO- CBG well controlled, change to AC/ HS  Anemia secondary to ABL- mild, follow  SCD for DVT prophylaxis- no enoxaparin secondary to thrombocytopenia  Thrombocytopenia- check HIT panel   LOS: 3 days    Melrose Nakayama 11/18/2015

## 2015-11-18 NOTE — Progress Notes (Signed)
Pt woke-up disoriented  Self-discontinued atrial pacing wires intact. Site cleaned and re dressed. Will continue to monitor.

## 2015-11-19 ENCOUNTER — Inpatient Hospital Stay (HOSPITAL_COMMUNITY): Payer: Medicare Other

## 2015-11-19 LAB — TYPE AND SCREEN
ABO/RH(D): A POS
Antibody Screen: NEGATIVE
Unit division: 0
Unit division: 0
Unit division: 0
Unit division: 0

## 2015-11-19 LAB — COMPREHENSIVE METABOLIC PANEL
ALT: 21 U/L (ref 14–54)
AST: 23 U/L (ref 15–41)
Albumin: 2.9 g/dL — ABNORMAL LOW (ref 3.5–5.0)
Alkaline Phosphatase: 40 U/L (ref 38–126)
Anion gap: 7 (ref 5–15)
BUN: 13 mg/dL (ref 6–20)
CO2: 23 mmol/L (ref 22–32)
Calcium: 7.5 mg/dL — ABNORMAL LOW (ref 8.9–10.3)
Chloride: 106 mmol/L (ref 101–111)
Creatinine, Ser: 0.76 mg/dL (ref 0.44–1.00)
GFR calc Af Amer: >60 mL/min (ref >60)
GFR calc non Af Amer: >60 mL/min (ref >60)
Glucose, Bld: 81 mg/dL (ref 65–99)
Potassium: 3.8 mmol/L (ref 3.5–5.1)
Sodium: 136 mmol/L (ref 135–145)
Total Bilirubin: 1.6 mg/dL — ABNORMAL HIGH (ref 0.3–1.2)
Total Protein: 5.2 g/dL — ABNORMAL LOW (ref 6.5–8.1)

## 2015-11-19 LAB — CBC
HCT: 27.6 % — ABNORMAL LOW (ref 36.0–46.0)
Hemoglobin: 9.2 g/dL — ABNORMAL LOW (ref 12.0–15.0)
MCH: 29 pg (ref 26.0–34.0)
MCHC: 33.3 g/dL (ref 30.0–36.0)
MCV: 87.1 fL (ref 78.0–100.0)
Platelets: 75 10*3/uL — ABNORMAL LOW (ref 150–400)
RBC: 3.17 MIL/uL — ABNORMAL LOW (ref 3.87–5.11)
RDW: 15.6 % — ABNORMAL HIGH (ref 11.5–15.5)
WBC: 17.1 10*3/uL — ABNORMAL HIGH (ref 4.0–10.5)

## 2015-11-19 LAB — GLUCOSE, CAPILLARY
Glucose-Capillary: 56 mg/dL — ABNORMAL LOW (ref 65–99)
Glucose-Capillary: 172 mg/dL — ABNORMAL HIGH (ref 65–99)
Glucose-Capillary: 111 mg/dL — ABNORMAL HIGH (ref 65–99)
Glucose-Capillary: 80 mg/dL (ref 65–99)
Glucose-Capillary: 75 mg/dL (ref 65–99)

## 2015-11-19 LAB — HEPARIN INDUCED PLATELET AB (HIT ANTIBODY): Heparin Induced Plt Ab: 0.11 OD (ref 0.000–0.400)

## 2015-11-19 LAB — CORTISOL-AM, BLOOD: Cortisol - AM: 12.8 ug/dL (ref 6.7–22.6)

## 2015-11-19 MED ORDER — MIDODRINE HCL 5 MG PO TABS
5.0000 mg | ORAL_TABLET | Freq: Two times a day (BID) | ORAL | Status: DC
  Administered 2015-11-19 (×2): 5 mg via ORAL
  Filled 2015-11-19 (×2): qty 1

## 2015-11-19 MED ORDER — DEXTROSE 50 % IV SOLN
INTRAVENOUS | Status: AC
  Administered 2015-11-19: 50 mL
  Filled 2015-11-19: qty 50

## 2015-11-19 NOTE — Evaluation (Signed)
Physical Therapy Evaluation Patient Details Name: [name] [name] MRN: [id number] DOB: 05-11-1931 Today's Date: 11/19/2015   History of Present Illness  pt presents post CABG x4.  pt with hx of Anemia, Depression, DM, HTN, CKD, and CAD.    Clinical Impression  Pt drowsy throughout session.  Pt is able to be aroused, but does not answer all questions and needs frequent cues for arousal.  Pt only able to ambulate a short distance due to becoming nauseated and needing to return to sitting.  Feel with nausea and arousal improved pt should make great progress with mobility.  If pt continues to require this level of A for mobility then may need to consider SNF for further rehab prior to returning to home.      Follow Up Recommendations Home health PT;Supervision/Assistance - 24 hour    Equipment Recommendations  Rolling walker with 5" wheels;3in1 (PT)    Recommendations for Other Services       Precautions / Restrictions Precautions Precautions: Fall;Sternal Precaution Comments: Reviewed sternal precautions Restrictions Weight Bearing Restrictions: No      Mobility  Bed Mobility               General bed mobility comments: pt in recliner.  Transfers Overall transfer level: Needs assistance Equipment used: Rolling walker (2 wheeled) Transfers: Sit to/from Stand Sit to Stand: Mod assist         General transfer comment: A for power up to standing and A controlling descent back to sitting, but not extensive A.  cues for UE use as pt keeps trying to push up from recliner.  Ambulation/Gait Ambulation/Gait assistance: Min assist;+2 safety/equipment Ambulation Distance (Feet): 10 Feet Assistive device: Rolling walker (2 wheeled) Gait Pattern/deviations: Step-through pattern;Decreased stride length;Trunk flexed     General Gait Details: pt moving well, but then begins to dry heave and needed 2nd person to bring recliner.  Feel pt could have ambulated despite drowsiness had  she not become nauseated.    Stairs            Wheelchair Mobility    Modified Rankin (Stroke Patients Only)       Balance Overall balance assessment: Needs assistance Sitting-balance support: No upper extremity supported;Feet supported Sitting balance-Leahy Scale: Good     Standing balance support: Bilateral upper extremity supported;During functional activity Standing balance-Leahy Scale: Poor                               Pertinent Vitals/Pain Pain Assessment: Faces Faces Pain Scale: Hurts a little bit Pain Location: chest Pain Descriptors / Indicators: Grimacing Pain Intervention(s): Monitored during session;Premedicated before session;Repositioned    Home Living Family/patient expects to be discharged to:: Private residence Living Arrangements: Spouse/significant other Available Help at Discharge: Family;Available 24 hours/day Type of Home: House Home Access: Stairs to enter Entrance Stairs-Rails:  (pt states theres a rail but unsure which side.) Entrance Stairs-Number of Steps: "few" Home Layout: One level   Additional Comments: pt drowsy and not answering all questions, so would need to double check home set-up.      Prior Function Level of Independence: Independent               Hand Dominance        Extremity/Trunk Assessment   Upper Extremity Assessment: Generalized weakness           Lower Extremity Assessment: Generalized weakness      Cervical / Trunk  Assessment: Kyphotic  Communication   Communication: No difficulties  Cognition Arousal/Alertness:  (Drowsy) Behavior During Therapy: Flat affect Overall Cognitive Status: Difficult to assess                      General Comments      Exercises     Assessment/Plan    PT Assessment Patient needs continued PT services  PT Problem List Decreased strength;Decreased activity tolerance;Decreased balance;Decreased mobility;Decreased knowledge of use of  DME;Decreased knowledge of precautions          PT Treatment Interventions DME instruction;Gait training;Stair training;Functional mobility training;Therapeutic activities;Therapeutic exercise;Balance training;Patient/family education    PT Goals (Current goals can be found in the Care Plan section)  Acute Rehab PT Goals Patient Stated Goal: pt did not answer. PT Goal Formulation: With patient Time For Goal Achievement: 12/03/15 Potential to Achieve Goals: Good    Frequency Min 3X/week   Barriers to discharge        Co-evaluation               End of Session Equipment Utilized During Treatment: Gait belt Activity Tolerance: Other (comment) (Limited by nausea) Patient left: in chair;with call bell/phone within reach;with chair alarm set;with nursing/sitter in room Nurse Communication: Mobility status (Nausea)         Time: [id number] PT Time Calculation (min) (ACUTE ONLY): 18 min   Charges:   PT Evaluation $PT Eval Moderate Complexity: 1 Procedure     PT G CodesCatarina Hartshorn, [place] [phone] 11/19/2015, 3:19 PM

## 2015-11-19 NOTE — Progress Notes (Signed)
Hypoglycemic Event  CBG: 56  Treatment: D50 IV 50 mL  Symptoms: pt lethargic this morning  Follow-up CBG: Time:0800 CBG Result:172  Possible Reasons for Event: Inadequate meal intake and medication regimen  Comments/MD notified: Hendrickson notified during rounds     [name] [name]

## 2015-11-19 NOTE — Progress Notes (Signed)
3 Days Post-Op Procedure(s) (LRB): TRANSESOPHAGEAL ECHOCARDIOGRAM (TEE) (N/A) OFF PUMP CORONARY ARTERY BYPASS GRAFTING (CABG) x FOUR, USING LEFT MAMMARY ARTERY  AND ENDOSCOPICALLY HARVESTED RIGHT GREATER SAPHENOUS VEIN. Subjective: Hypoglycemic this AM lethargic but nonfocal  Objective: Vital signs in last 24 hours: Temp:  [97.6 F (36.4 C)-98.8 F (37.1 C)] 97.8 F (36.6 C) (11/12 0727) Pulse Rate:  [88-146] 95 (11/12 0700) Cardiac Rhythm: Normal sinus rhythm;Sinus tachycardia (11/11 2000) Resp:  [11-30] 14 (11/12 0700) BP: (81-131)/(48-93) 81/56 (11/12 0700) SpO2:  [90 %-99 %] 97 % (11/12 0700) Arterial Line BP: (88-99)/(41-46) 88/46 (11/11 0845) Weight:  [155 lb 3.3 oz (70.4 kg)] 155 lb 3.3 oz (70.4 kg) (11/12 0400)  Hemodynamic parameters for last 24 hours:    Intake/Output from previous day: 11/11 0701 - 11/12 0700 In: 1437.3 [P.O.:450; I.V.:652.3; Blood:335] Out: 475 [Urine:475] Intake/Output this shift: No intake/output data recorded.  General appearance: cooperative and no distress Neurologic: nonfocal Heart: slightly irregular Lungs: diminished breath sounds bibasilar Abdomen: normal findings: soft, non-tender Wound: clean and dry  Lab Results:  Recent Labs  11/18/15 0410 11/18/15 1600 11/19/15 0400  WBC 19.3*  --  17.1*  HGB 8.1* 9.3* 9.2*  HCT 24.1* 28.2* 27.6*  PLT 66*  --  75*   BMET:  Recent Labs  11/18/15 0410 11/19/15 0400  NA 139 136  K 4.2 3.8  CL 109 106  CO2 23 23  GLUCOSE 109* 81  BUN 8 13  CREATININE 0.75 0.76  CALCIUM 7.6* 7.5*    PT/INR:  Recent Labs  11/16/15 1500  LABPROT 18.7*  INR 1.55   ABG    Component Value Date/Time   PHART 7.345 (L) 11/17/2015 0323   HCO3 20.4 11/17/2015 0323   TCO2 22 11/17/2015 1647   ACIDBASEDEF 5.0 (H) 11/17/2015 0323   O2SAT 95.0 11/17/2015 0323   CBG (last 3)   Recent Labs  11/18/15 1519 11/18/15 2153 11/19/15 0725  GLUCAP 105* 104* 60*    Assessment/Plan: S/P  Procedure(s) (LRB): TRANSESOPHAGEAL ECHOCARDIOGRAM (TEE) (N/A) OFF PUMP CORONARY ARTERY BYPASS GRAFTING (CABG) x FOUR, USING LEFT MAMMARY ARTERY  AND ENDOSCOPICALLY HARVESTED RIGHT GREATER SAPHENOUS VEIN. -POD # 3 Slow to progress CV- BP relatively low, will add midodrine  RESP- bibasilar atelectasis  RENAL- creatinine stable  ENDO- hypoglycemia- dc levemir  Mobilize as tolerated   LOS: 4 days    [name] [name] 11/19/2015

## 2015-11-19 NOTE — Progress Notes (Signed)
      [address]       [place], [address]             [phone]      Not much change  BP 135/67 (BP Location: Left Arm)   Pulse 98   Temp 98.3 F (36.8 C) (Oral)   Resp 14   Ht 5\' 1"  (1.549 m)   Wt 155 lb 3.3 oz (70.4 kg)   LMP  (LMP Unknown)   SpO2 98%   BMI 29.33 kg/m    Intake/Output Summary (Last 24 hours) at 11/19/15 1753 Last data filed at 11/19/15 1441  Gross per 24 hour  Intake              580 ml  Output              775 ml  Net             -195 ml    Probably transfer to [place]. Roxan Hockey, MD [facility] [phone]

## 2015-11-20 ENCOUNTER — Inpatient Hospital Stay (HOSPITAL_COMMUNITY): Payer: Medicare Other

## 2015-11-20 LAB — CBC
HCT: 27.5 % — ABNORMAL LOW (ref 36.0–46.0)
Hemoglobin: 9.1 g/dL — ABNORMAL LOW (ref 12.0–15.0)
MCH: 28.8 pg (ref 26.0–34.0)
MCHC: 33.1 g/dL (ref 30.0–36.0)
MCV: 87 fL (ref 78.0–100.0)
Platelets: 98 10*3/uL — ABNORMAL LOW (ref 150–400)
RBC: 3.16 MIL/uL — ABNORMAL LOW (ref 3.87–5.11)
RDW: 15.5 % (ref 11.5–15.5)
WBC: 8.6 10*3/uL (ref 4.0–10.5)

## 2015-11-20 LAB — GLUCOSE, CAPILLARY
Glucose-Capillary: 85 mg/dL (ref 65–99)
Glucose-Capillary: 119 mg/dL — ABNORMAL HIGH (ref 65–99)
Glucose-Capillary: 105 mg/dL — ABNORMAL HIGH (ref 65–99)
Glucose-Capillary: 109 mg/dL — ABNORMAL HIGH (ref 65–99)

## 2015-11-20 LAB — COMPREHENSIVE METABOLIC PANEL
ALT: 17 U/L (ref 14–54)
AST: 15 U/L (ref 15–41)
Albumin: 2.7 g/dL — ABNORMAL LOW (ref 3.5–5.0)
Alkaline Phosphatase: 43 U/L (ref 38–126)
Anion gap: 6 (ref 5–15)
BUN: 8 mg/dL (ref 6–20)
CO2: 26 mmol/L (ref 22–32)
Calcium: 7.7 mg/dL — ABNORMAL LOW (ref 8.9–10.3)
Chloride: 110 mmol/L (ref 101–111)
Creatinine, Ser: 0.62 mg/dL (ref 0.44–1.00)
GFR calc Af Amer: >60 mL/min (ref >60)
GFR calc non Af Amer: >60 mL/min (ref >60)
Glucose, Bld: 76 mg/dL (ref 65–99)
Potassium: 3.6 mmol/L (ref 3.5–5.1)
Sodium: 142 mmol/L (ref 135–145)
Total Bilirubin: 1.2 mg/dL (ref 0.3–1.2)
Total Protein: 5.2 g/dL — ABNORMAL LOW (ref 6.5–8.1)

## 2015-11-20 MED ORDER — SODIUM CHLORIDE 0.9% FLUSH: 3.0000 mL | INTRAVENOUS | Status: DC | PRN

## 2015-11-20 MED ORDER — METOPROLOL TARTRATE 25 MG PO TABS
25.0000 mg | ORAL_TABLET | Freq: Two times a day (BID) | ORAL | Status: DC
  Administered 2015-11-20 (×2): 25 mg via ORAL
  Filled 2015-11-20 (×2): qty 1

## 2015-11-20 MED ORDER — SODIUM CHLORIDE 0.9% FLUSH
3.0000 mL | Freq: Two times a day (BID) | INTRAVENOUS | Status: DC
  Administered 2015-11-20 – 2015-11-24 (×9): 3 mL via INTRAVENOUS

## 2015-11-20 MED ORDER — METOPROLOL TARTRATE 25 MG/10 ML ORAL SUSPENSION
25.0000 mg | Freq: Two times a day (BID) | ORAL | Status: DC
  Filled 2015-11-20: qty 10

## 2015-11-20 MED ORDER — MOVING RIGHT ALONG BOOK
Freq: Once | Status: AC
  Administered 2015-11-20: 1
  Filled 2015-11-20: qty 1

## 2015-11-20 MED ORDER — MIDODRINE HCL 5 MG PO TABS
5.0000 mg | ORAL_TABLET | Freq: Two times a day (BID) | ORAL | Status: DC
  Administered 2015-11-20: 5 mg via ORAL
  Filled 2015-11-20 (×3): qty 1

## 2015-11-20 MED ORDER — MAGNESIUM HYDROXIDE 400 MG/5ML PO SUSP: 30.0000 mL | Freq: Every day | ORAL | Status: DC | PRN

## 2015-11-20 MED ORDER — ALUM & MAG HYDROXIDE-SIMETH 200-200-20 MG/5ML PO SUSP: 15.0000 mL | ORAL | Status: DC | PRN

## 2015-11-20 MED ORDER — POTASSIUM CHLORIDE 10 MEQ/50ML IV SOLN
10.0000 meq | INTRAVENOUS | Status: DC
  Administered 2015-11-20: 10 meq via INTRAVENOUS
  Filled 2015-11-20 (×2): qty 50

## 2015-11-20 MED ORDER — SODIUM CHLORIDE 0.9 % IV SOLN: 250.0000 mL | INTRAVENOUS | Status: DC | PRN

## 2015-11-20 NOTE — Progress Notes (Signed)
4 Days Post-Op Procedure(s) (LRB): TRANSESOPHAGEAL ECHOCARDIOGRAM (TEE) (N/A) OFF PUMP CORONARY ARTERY BYPASS GRAFTING (CABG) x FOUR, USING LEFT MAMMARY ARTERY  AND ENDOSCOPICALLY HARVESTED RIGHT GREATER SAPHENOUS VEIN. Subjective: Ambulated last night and again this AM Less lethargic but still falls asleep easily Appetite remains poor  Objective: Vital signs in last 24 hours: Temp:  [98.2 F (36.8 C)-98.6 F (37 C)] 98.3 F (36.8 C) (11/13 0709) Pulse Rate:  [81-114] 109 (11/13 0700) Cardiac Rhythm: Sinus tachycardia (11/13 0100) Resp:  [6-29] 24 (11/13 0700) BP: (87-160)/(48-93) 141/76 (11/13 0700) SpO2:  [93 %-100 %] 96 % (11/13 0700) Weight:  [152 lb 1.9 oz (69 kg)] 152 lb 1.9 oz (69 kg) (11/13 0500)  Hemodynamic parameters for last 24 hours:    Intake/Output from previous day: 11/12 0701 - 11/13 0700 In: 630 [P.O.:120; I.V.:460; IV Piggyback:50] Out: 1750 [Urine:1750] Intake/Output this shift: No intake/output data recorded.  General appearance: alert, cooperative and no distress Neurologic: no focal weakness Heart: tachy, regular Lungs: diminished breath sounds bibasilar Abdomen: normal findings: soft, non-tender  Lab Results:  Recent Labs  11/19/15 0400 11/20/15 0500  WBC 17.1* 8.6  HGB 9.2* 9.1*  HCT 27.6* 27.5*  PLT 75* 98*   BMET:  Recent Labs  11/19/15 0400 11/20/15 0500  NA 136 142  K 3.8 3.6  CL 106 110  CO2 23 26  GLUCOSE 81 76  BUN 13 8  CREATININE 0.76 0.62  CALCIUM 7.5* 7.7*    PT/INR: No results for input(s): LABPROT, INR in the last 72 hours. ABG    Component Value Date/Time   PHART 7.345 (L) 11/17/2015 0323   HCO3 20.4 11/17/2015 0323   TCO2 22 11/17/2015 1647   ACIDBASEDEF 5.0 (H) 11/17/2015 0323   O2SAT 95.0 11/17/2015 0323   CBG (last 3)   Recent Labs  11/19/15 1554 11/19/15 2114 11/20/15 0707  GLUCAP 80 75 85    Assessment/Plan: S/P Procedure(s) (LRB): TRANSESOPHAGEAL ECHOCARDIOGRAM (TEE) (N/A) OFF PUMP  CORONARY ARTERY BYPASS GRAFTING (CABG) x FOUR, USING LEFT MAMMARY ARTERY  AND ENDOSCOPICALLY HARVESTED RIGHT GREATER SAPHENOUS VEIN.  CV- BP up with midodrine, may be able to dc soon  Tachy- increase lopressor  RESP- poor effort with IS, continue flutter  CXR improving  RENAL- creatinine and lytes OK, diuresed yesterday  ENDO- CBG well controlled last 24 hours  Thrombocytopenia- gradually improving  Deconditioning- ambulating but still needs a lot of assistance  transfer to [place]   LOS: 5 days    Melrose Nakayama 11/20/2015

## 2015-11-20 NOTE — Care Management Important Message (Signed)
Important Message  Patient Details  Name: [name] [name] MRN: [id number] Date of Birth: 1931/09/23   Medicare Important Message Given:  Other (see comment)    [place], [place] Vanaman Abena 11/20/2015, 10:21 AM

## 2015-11-20 NOTE — Progress Notes (Signed)
CARDIAC REHAB PHASE I   PRE:  Rate/Rhythm: 102 ST with PVCs    BP: lying 130/74, sitting 142/74    SaO2: 88-91 RA  MODE:  Ambulation: 150 ft   POST:  Rate/Rhythm: 120 ST with PVC    BP: sitting 138/72     SaO2: 94 RA   Pt sleepy, not wanting to get up. Stood easily but c/o not feeling well. She could not specify why she felt bad. Sat again, BP stable at 142/74. Ambulated with RW, assist x1 with gait belt, followed with w/c for safety. Quick pace. Continued to st she did not feel well but could not specify. To [facility] after walking then recliner reluctantly. Pts family came in and she began smiling and conversing, wanting a crossword puzzle. Will continue to follow. [place], ACSM 11/20/2015 1:48 PM

## 2015-11-21 LAB — BASIC METABOLIC PANEL
Anion gap: 10 (ref 5–15)
BUN: 8 mg/dL (ref 6–20)
CO2: 21 mmol/L — ABNORMAL LOW (ref 22–32)
Calcium: 7.8 mg/dL — ABNORMAL LOW (ref 8.9–10.3)
Chloride: 111 mmol/L (ref 101–111)
Creatinine, Ser: 0.75 mg/dL (ref 0.44–1.00)
GFR calc Af Amer: >60 mL/min (ref >60)
GFR calc non Af Amer: >60 mL/min (ref >60)
Glucose, Bld: 96 mg/dL (ref 65–99)
Potassium: 3.8 mmol/L (ref 3.5–5.1)
Sodium: 142 mmol/L (ref 135–145)

## 2015-11-21 LAB — CBC
HCT: 27.6 % — ABNORMAL LOW (ref 36.0–46.0)
Hemoglobin: 9.1 g/dL — ABNORMAL LOW (ref 12.0–15.0)
MCH: 29.1 pg (ref 26.0–34.0)
MCHC: 33 g/dL (ref 30.0–36.0)
MCV: 88.2 fL (ref 78.0–100.0)
Platelets: 142 10*3/uL — ABNORMAL LOW (ref 150–400)
RBC: 3.13 MIL/uL — ABNORMAL LOW (ref 3.87–5.11)
RDW: 16 % — ABNORMAL HIGH (ref 11.5–15.5)
WBC: 7 10*3/uL (ref 4.0–10.5)

## 2015-11-21 LAB — GLUCOSE, CAPILLARY
Glucose-Capillary: 96 mg/dL (ref 65–99)
Glucose-Capillary: 108 mg/dL — ABNORMAL HIGH (ref 65–99)
Glucose-Capillary: 102 mg/dL — ABNORMAL HIGH (ref 65–99)
Glucose-Capillary: 106 mg/dL — ABNORMAL HIGH (ref 65–99)

## 2015-11-21 MED ORDER — METOPROLOL TARTRATE 25 MG PO TABS
25.0000 mg | ORAL_TABLET | Freq: Two times a day (BID) | ORAL | Status: DC
  Administered 2015-11-21: 25 mg via ORAL
  Filled 2015-11-21 (×2): qty 1

## 2015-11-21 MED ORDER — HYDRALAZINE HCL 20 MG/ML IJ SOLN: 10.0000 mg | INTRAMUSCULAR | Status: DC | PRN

## 2015-11-21 MED ORDER — METOPROLOL TARTRATE 50 MG PO TABS: 50.0000 mg | ORAL_TABLET | Freq: Two times a day (BID) | ORAL | Status: DC

## 2015-11-21 MED ORDER — METOPROLOL TARTRATE 50 MG PO TABS
50.0000 mg | ORAL_TABLET | Freq: Two times a day (BID) | ORAL | Status: DC
  Administered 2015-11-21 – 2015-11-25 (×8): 50 mg via ORAL
  Filled 2015-11-21 (×8): qty 1

## 2015-11-21 NOTE — Progress Notes (Signed)
Removed left side wire with slight resistance pt. Tolerated procedure well.  bed rest for 1 hr @1105 

## 2015-11-21 NOTE — Discharge Summary (Signed)
Physician Discharge Summary  Patient ID: [name] [name] MRN: [id number] DOB/AGE: Jul 04, 1931 81 y.o.  Admit date: 11/15/2015 Discharge date: 11/25/2015  Admission Diagnoses: Unstable angina  Discharge Diagnoses:  Active Problems:   Unstable angina (HCC)   Coronary artery disease   S/P CABG x 4  Patient Active Problem List   Diagnosis Date Noted  . S/P CABG x 4   . Coronary artery disease 11/16/2015  . Unstable angina ([place]) 11/15/2015  . Medication management 07/27/2013  . Vitamin D deficiency 02/01/2013  . GERD (gastroesophageal reflux disease)   . Hypothyroidism   . CKD stage 2 due to type 2 diabetes mellitus ([place]) 06/04/2007  . Hyperlipidemia 06/04/2007  . Overweight (BMI 25.0-29.9) 06/04/2007  . Essential hypertension 06/04/2007   History of Present Illness:    At time of consultation Ms. [name] [name] is an 82 year old female with a past medical history positive for anemia, depression, CKD, Diabetes mellitus, GERD, hypothyroidism, and hypertension who presented to [facility] for an evaluation of her unstable angina. She was seen last month in her PCP office for complaints of chest pain, vomiting, and nausea. Her troponin was normal this admission. An EKG was done showing concern for ischemia. She has been treating her chest pain at home with nitroglycerin, up to 4 a day for a few months. She underwent cardiac catherization today which showed severe multivessel disease. We are consulted to work her up for revascularization with CABG.  No current chest pain.   Of note the patient lives with her husband who is going blind per the son. The son lives about 45 minutes away. She can do all ADLs but is otherwise sedentary.   Discharged Condition: good  Hospital Course: The patient was admitted electively and taken to the operating room where she underwent the below described procedure. She tolerated procedure well and was taken to the surgical intensive care unit in good condition. She  did require 2 units of packed cells in the operating room.  Postoperative hospital course:  She initially did require some blood pressure support this was weaned over time without significant difficulty. Blood pressure was also assisted with mididodrine. Early postoperatively she did have some lethargy but has remained neurologically intact. Blood sugars have been under adequate control using standard measures. Her appetite has been somewhat of an issue and has not normalized quite yet. She does have an expected acute blood loss anemia and values are stable. She has required aggressive pulmonary toilet but has responded well to routine measures. She did have postoperative thrombocytopenia but values have been improving. She did have a mild postoperative volume overload but is now below preoperative weight and not requiring diuretics. Incisions are healing well without evidence of infection. Activity level is slowly improving with cardiac rehabilitation.  It was felt SNF would be beneficial to the patient.  However, she declined placement and therefore, home health arrangements were made.  She is medically stable for discharge home today.  Consults: None  Significant Diagnostic Studies: Routine postoperative serial chest x-ray and laboratories.  Treatments: surgery:  DATE OF PROCEDURE:  11/16/2015 DATE OF DISCHARGE:                              OPERATIVE REPORT   PREOPERATIVE DIAGNOSIS:  Severe three-vessel coronary artery disease.  POSTOPERATIVE DIAGNOSES:  Severe three-vessel coronary artery disease, severe calcific atherosclerotic disease of the ascending aorta.  PROCEDURE:  Median sternotomy, off pump  coronary artery bypass grafting x4 (left internal mammary artery to left anterior descending, saphenous vein graft to ramus intermedius, saphenous vein graft to obtuse marginal 1, and saphenous vein graft to posterior descending), endoscopic vein harvest, right leg.  SURGEON:   Revonda Standard. Roxan Hockey, M.D.  ASSISTANT:  Lars Pinks, PA  ANESTHESIA:  General.  FINDINGS:  Transesophageal echocardiography showed ejection fraction approximately 30% with anterior hypokinesis.  Post procedure TEE, no significant change.  She did have mild aortic and mitral insufficiency, sternal osteoporosis.  OM fair. Quality target, remaining.  Target is good quality.  Mammary and saphenous vein good quality.  Severe calcific atherosclerotic disease of the ascending aorta.  Disposition: Home  Discharge medications:  The patient has been discharged on:   1.Beta Blocker:  Yes [ x  ]                              No   [   ]                              If No, reason:  2.Ace Inhibitor/ARB: Yes [ x  ]                                     No  [    ]                                     If No, reason:  3.Statin:   Yes [x   ]                  No  [   ]                  If No, reason:  4.Ecasa:  Yes  [  x ]                  No   [   ]                  If No, reason:     Medication List    STOP taking these medications   atenolol 100 MG tablet Commonly known as:  TENORMIN   isosorbide mononitrate 30 MG 24 hr tablet Commonly known as:  IMDUR   verapamil 240 MG CR tablet Commonly known as:  CALAN-SR     TAKE these medications   aspirin 325 MG EC tablet Take 1 tablet (325 mg total) by mouth daily. What changed:  medication strength  how much to take   atorvastatin 80 MG tablet Commonly known as:  LIPITOR TAKE 1 TABLET DAILY FOR CHOLESTEROL What changed:  See the new instructions.   lisinopril 2.5 MG tablet Commonly known as:  PRINIVIL,ZESTRIL Take 1 tablet (2.5 mg total) by mouth daily.   lubiprostone 24 MCG capsule Commonly known as:  AMITIZA Take 24 mcg by mouth daily.   metFORMIN 500 MG 24 hr tablet Commonly known as:  GLUCOPHAGE-XR TAKE 2 TABLETS TWICE A DAY What changed:  See the new instructions.   metoprolol 50 MG tablet Commonly  known as:  LOPRESSOR Take 1 tablet (50 mg total) by mouth 2 (two) times daily.   mirtazapine 15 MG tablet  Commonly known as:  REMERON Take 1 tablet (15 mg total) by mouth at bedtime.   nitroGLYCERIN 0.4 MG SL tablet Commonly known as:  NITROSTAT Dissolve 1 tablet under tongue every 3 to 5 minutes if needed for ChestPain   polyethylene glycol packet Commonly known as:  MIRALAX / GLYCOLAX Take 17 g by mouth daily.   traMADol 50 MG tablet Commonly known as:  ULTRAM Take 1-2 tablets (50-100 mg total) by mouth every 6 (six) hours as needed for moderate pain.            Durable Medical Equipment        Start     Ordered   11/21/15 0759  For home use only DME Eelevated commode seat  Once     11/21/15 0759   11/21/15 0758  For home use only DME Walker rolling  Once     11/21/15 [id number]     Follow-up Information    Melrose Nakayama, MD Follow up.   Specialty:  Cardiothoracic Surgery Why:  Appointment to see the surgeon on 12/19/2015 at 12 noon. Please obtain a chest x-ray [facility] at 11:30 AM. [facility] imaging is located in the same office complex. Contact information: [address] [place] [address] [phone]        [facility] Follow up.   Why:  HHRN/PT arranged- they will call you to set up home visits Contact information: [address] [place] [address] [phone]        Inc[facility] - Dme [facility] Follow up.   Why:  rolling walker and elevated toilet arranged- to be delivered to room prior to discharge Contact information: [place] [address] [phone]        Shelva Majestic, MD Follow up.   Specialty:  Cardiology Why:  office will contact you with follow up appointment Contact information: [address] [place] [address] [phone]           Signed: Ellwood Handler 11/25/2015, 7:31 AM

## 2015-11-21 NOTE — Anesthesia Postprocedure Evaluation (Signed)
Anesthesia Post Note  Patient: [name] [name]  Procedure(s) Performed: Procedure(s) (LRB): TRANSESOPHAGEAL ECHOCARDIOGRAM (TEE) (N/A) OFF PUMP CORONARY ARTERY BYPASS GRAFTING (CABG) x FOUR, USING LEFT MAMMARY ARTERY  AND ENDOSCOPICALLY HARVESTED RIGHT GREATER SAPHENOUS VEIN.  Patient location during evaluation: Nursing Unit Anesthesia Type: General Level of consciousness: awake and alert, oriented and patient cooperative Pain management: pain level controlled Vital Signs Assessment: post-procedure vital signs reviewed and stable Respiratory status: spontaneous breathing, nonlabored ventilation, respiratory function stable and patient connected to nasal cannula oxygen Cardiovascular status: stable and blood pressure returned to baseline Postop Assessment: no signs of nausea or vomiting (ambulating with assistance) Anesthetic complications: no    Last Vitals:  Vitals:   11/21/15 1150 11/21/15 1312  BP: (!) 152/71 (!) 156/86  Pulse: 92 100  Resp:  17  Temp:  36.8 C    Last Pain:  Vitals:   11/21/15 1312  TempSrc: Oral  PainSc:                  [name],E. Lakayla Barrington

## 2015-11-21 NOTE — Discharge Instructions (Signed)
Endoscopic Saphenous Vein Harvesting, Care After °Introduction °Refer to this sheet in the next few weeks. These instructions provide you with information about caring for yourself after your procedure. Your health care provider may also give you more specific instructions. Your treatment has been planned according to current medical practices, but problems sometimes occur. Call your health care provider if you have any problems or questions after your procedure. °What can I expect after the procedure? °After the procedure, it is common to have: °· Pain. °· Bruising. °· Swelling. °· Numbness. °Follow these instructions at home: °Medicine °· Take over-the-counter and prescription medicines only as told by your health care provider. °· Do not drive or operate heavy machinery while taking prescription pain medicine. °Incision care °· Follow instructions from your health care provider about how to take care of the cut made during surgery (incision). Make sure you: °¨ Wash your hands with soap and water before you change your bandage (dressing). If soap and water are not available, use hand sanitizer. °¨ Change your dressing as told by your health care provider. °¨ Leave stitches (sutures), skin glue, or adhesive strips in place. These skin closures may need to be in place for 2 weeks or longer. If adhesive strip edges start to loosen and curl up, you may trim the loose edges. Do not remove adhesive strips completely unless your health care provider tells you to do that. °· Check your incision area every day for signs of infection. Check for: °¨ More redness, swelling, or pain. °¨ More fluid or blood. °¨ Warmth. °¨ Pus or a bad smell. °General instructions °· Raise (elevate) your legs above the level of your heart while you are sitting or lying down. °· Do any exercises your health care providers have given you. These may include deep breathing, coughing, and walking exercises. °· Do not shower, take baths, swim, or use  a hot tub unless told by your health care provider. °· Wear your elastic stocking if told by your health care provider. °· Keep all follow-up visits as told by your health care provider. This is important. °Contact a health care provider if: °· Medicine does not help your pain. °· Your pain gets worse. °· You have new leg bruises or your leg bruises get bigger. °· You have a fever. °· Your leg feels numb. °· You have more redness, swelling, or pain around your incision. °· You have more fluid or blood coming from your incision. °· Your incision feels warm to the touch. °· You have pus or a bad smell coming from your incision. °Get help right away if: °· Your pain is severe. °· You develop pain, tenderness, warmth, redness, or swelling in any part of your leg. °· You have chest pain. °· You have trouble breathing. °This information is not intended to replace advice given to you by your health care provider. Make sure you discuss any questions you have with your health care provider. °Document Released: 09/05/2010 Document Revised: 06/01/2015 Document Reviewed: 11/07/2014 °© 2017 Elsevier °Coronary Artery Bypass Grafting, Care After °These instructions give you information on caring for yourself after your procedure. Your doctor may also give you more specific instructions. Call your doctor if you have any problems or questions after your procedure. °Follow these instructions at home: °· Only take medicine as told by your doctor. Take medicines exactly as told. Do not stop taking medicines or start any new medicines without talking to your doctor first. °· Take your pulse as told   by your doctor. °· Do deep breathing as told by your doctor. Use your breathing device (incentive spirometer), if given, to practice deep breathing several times a day. Support your chest with a pillow or your arms when you take deep breaths or cough. °· Keep the area clean, dry, and protected where the surgery cuts (incisions) were made.  Remove bandages (dressings) only as told by your doctor. If strips were applied to surgical area, do not take them off. They fall off on their own. °· Check the surgery area daily for puffiness (swelling), redness, or leaking fluid. °· If surgery cuts were made in your legs: °¨ Avoid crossing your legs. °¨ Avoid sitting for long periods of time. Change positions every 30 minutes. °¨ Raise your legs when you are sitting. Place them on pillows. °· Wear stockings that help keep blood clots from forming in your legs (compression stockings). °· Only take sponge baths until your doctor says it is okay to take showers. Pat the surgery area dry. Do not rub the surgery area with a washcloth or towel. Do not bathe, swim, or use a hot tub until your doctor says it is okay. °· Eat foods that are high in fiber. These include raw fruits and vegetables, whole grains, beans, and nuts. Choose lean meats. Avoid canned, processed, and fried foods. °· Drink enough fluids to keep your pee (urine) clear or pale yellow. °· Weigh yourself every day. °· Rest and limit activity as told by your doctor. You may be told to: °¨ Stop any activity if you have chest pain, shortness of breath, changes in heartbeat, or dizziness. Get help right away if this happens. °¨ Move around often for short amounts of time or take short walks as told by your doctor. Gradually become more active. You may need help to strengthen your muscles and build endurance. °¨ Avoid lifting, pushing, or pulling anything heavier than 10 pounds (4.5 kg) for at least 6 weeks after surgery. °· Do not drive until your doctor says it is okay. °· Ask your doctor when you can go back to work. °· Ask your doctor when you can begin sexual activity again. °· Follow up with your doctor as told. °Contact a doctor if: °· You have puffiness, redness, more pain, or fluid draining from the incision site. °· You have a fever. °· You have puffiness in your ankles or legs. °· You have pain in  your legs. °· You gain 2 or more pounds (0.9 kg) a day. °· You feel sick to your stomach (nauseous) or throw up (vomit). °· You have watery poop (diarrhea). °Get help right away if: °· You have chest pain that goes to your jaw or arms. °· You have shortness of breath. °· You have a fast or irregular heartbeat. °· You notice a "clicking" in your breastbone when you move. °· You have numbness or weakness in your arms or legs. °· You feel dizzy or light-headed. °This information is not intended to replace advice given to you by your health care provider. Make sure you discuss any questions you have with your health care provider. °Document Released: 12/29/2012 Document Revised: 06/01/2015 Document Reviewed: 06/02/2012 °Elsevier Interactive Patient Education © 2017 [employer]. ° °

## 2015-11-21 NOTE — Progress Notes (Signed)
CARDIAC REHAB PHASE I   PRE:  Rate/Rhythm: 100 SR PVCs  BP:  Supine: 171/88  Sitting:   Standing:    SaO2: 94%RA  MODE:  Ambulation: 150 ft   POST:  Rate/Rhythm: 116 ST PVCs  BP:  Supine:   Sitting: 144/97  Standing:    SaO2: 96%RA 1232-1255 Pt off of bedrest. Walked 150 ft on RA with gait belt use, rolling walker, and asst x 2. Would have tried to go farther but pt stated she needed to go to [facility]. Fast pace at times.. Assisted to [facility] and offered to walk some more but she declined. To recliner with chair alarm. Set up lunch.   Graylon Good, RN BSN  11/21/2015 12:52 PM

## 2015-11-21 NOTE — Progress Notes (Addendum)
      [address]       [place] [address]             [phone]      5 Days Post-Op Procedure(s) (LRB): TRANSESOPHAGEAL ECHOCARDIOGRAM (TEE) (N/A) OFF PUMP CORONARY ARTERY BYPASS GRAFTING (CABG) x FOUR, USING LEFT MAMMARY ARTERY  AND ENDOSCOPICALLY HARVESTED RIGHT GREATER SAPHENOUS VEIN.   Subjective:  Ms. Erbes has no specific complaints.  She states she has ambulated + BM  Objective: Vital signs in last 24 hours: Temp:  [98.2 F (36.8 C)-98.9 F (37.2 C)] 98.9 F (37.2 C) (11/14 0521) Pulse Rate:  [107-118] 109 (11/14 0521) Cardiac Rhythm: Sinus tachycardia (11/14 0710) Resp:  [15-18] 18 (11/14 0521) BP: (123-152)/(63-86) 152/86 (11/14 0521) SpO2:  [92 %-96 %] 92 % (11/14 0521) Weight:  [148 lb 13 oz (67.5 kg)] 148 lb 13 oz (67.5 kg) (11/14 0521)   Intake/Output from previous day: 11/13 0701 - 11/14 0700 In: -  Out: 2675 [Urine:2675]  General appearance: alert, cooperative and no distress Heart: regular rate and rhythm and tachy Lungs: clear to auscultation bilaterally Abdomen: soft, non-tender; bowel sounds normal; no masses,  no organomegaly Extremities: edema trace Wound: clean and dry, ecchymotic RLE from [facility]  Lab Results:  Recent Labs  11/20/15 0500 11/21/15 0249  WBC 8.6 7.0  HGB 9.1* 9.1*  HCT 27.5* 27.6*  PLT 98* 142*   BMET:  Recent Labs  11/20/15 0500 11/21/15 0249  NA 142 142  K 3.6 3.8  CL 110 111  CO2 26 21*  GLUCOSE 76 96  BUN 8 8  CREATININE 0.62 0.75  CALCIUM 7.7* 7.8*    PT/INR: No results for input(s): LABPROT, INR in the last 72 hours. ABG    Component Value Date/Time   PHART 7.345 (L) 11/17/2015 0323   HCO3 20.4 11/17/2015 0323   TCO2 22 11/17/2015 1647   ACIDBASEDEF 5.0 (H) 11/17/2015 0323   O2SAT 95.0 11/17/2015 0323   CBG (last 3)   Recent Labs  11/20/15 1628 11/20/15 2022 11/21/15 0631  GLUCAP 105* 109* 96    Assessment/Plan: S/P Procedure(s) (LRB): TRANSESOPHAGEAL ECHOCARDIOGRAM  (TEE) (N/A) OFF PUMP CORONARY ARTERY BYPASS GRAFTING (CABG) x FOUR, USING LEFT MAMMARY ARTERY  AND ENDOSCOPICALLY HARVESTED RIGHT GREATER SAPHENOUS VEIN.  1. CV- Sinus Tach, HTN SBP in the 150s- will increase Lopressor to 50 mg BID, can likely d/c Midodrine, however parameters in place for administration 2. Pulm- no acute issues, continue IS 3. Renal- creatinine stable, weight is below baseline, no currently requiring Lasix 4. Thrombocytopenia- continues to improve 5. DM- uncontrolled, preoperatively, sugars well controlled currently- continue regimen 6. Dispo- patient remains tachy, will increase Lopressor, probably stop Midodrine, d/c EPW, oral intake remains poor, continue current care   LOS: 6 days    BARRETT, ERIN 11/21/2015  Patient seen and examined, agree with above Will dc midodrine as BP now high, but keep metoprolol at 25 mg BID for now  [place]. Roxan Hockey, MD [facility] [phone]

## 2015-11-21 NOTE — Progress Notes (Signed)
Physical Therapy Treatment Patient Details Name: [name] [name] MRN: [id number] DOB: [birth year] Today's Date: 11/21/2015    History of Present Illness pt presents post CABG x4.  pt with hx of Anemia, Depression, DM, HTN, CKD, and CAD.      PT Comments    Pt standing in room with bed alarm going off upon arrival. When asked, she does not remember sternal precautions. Patient demonstrates decreased short term memory, orientation and safety awareness. Pt reports that her family is able to take care of her at home and she insists on returning home. If family is able to provide 24 hour supervision, home health PT is a feasible discharge option. Pt will continue to need 24 hour assistance for discharge. Home set up updated.   Hr: 110-132 O2sat: 94% room air Post-Bp: 153/85   Follow Up Recommendations  Home health PT;Supervision/Assistance - 24 hour     Equipment Recommendations  Rolling walker with 5" wheels    Recommendations for Other Services       Precautions / Restrictions Precautions Precautions: Fall;Sternal Precaution Comments: Reviewed sternal precautions pt unaware of them on arrival, able to state 2/5 end of session    Mobility  Bed Mobility               General bed mobility comments: standing with alarm on bed going off on arrival  Transfers Overall transfer level: Needs assistance   Transfers: Sit to/from Stand Sit to Stand: Min guard         General transfer comment: max cues for hand placement on thighs, precautions and safety x 3 trials during session  Ambulation/Gait Ambulation/Gait assistance: Min assist Ambulation Distance (Feet): 160 Feet Assistive device: Rolling walker (2 wheeled) Gait Pattern/deviations: Step-through pattern;Decreased stride length;Trunk flexed   Gait velocity interpretation: Below normal speed for age/gender General Gait Details: cues for posture, position in RW and directional cues. assist to avoid obstacles. Pt trying  to ditch RW several times during gait   Stairs            Wheelchair Mobility    Modified Rankin (Stroke Patients Only)       Balance                                    Cognition Arousal/Alertness: Awake/alert Behavior During Therapy: Flat affect Overall Cognitive Status: Impaired/Different from baseline Area of Impairment: Orientation;Memory;Attention;Safety/judgement;Following commands Orientation Level: Disoriented to;Time;Situation Current Attention Level: Sustained Memory: Decreased recall of precautions;Decreased short-term memory Following Commands: Follows one step commands with increased time Safety/Judgement: Decreased awareness of safety          Exercises General Exercises - Lower Extremity Long Arc Quad: AROM;Right;Left;Strengthening;15 reps;Seated Hip Flexion/Marching: AROM;Right;Left;Strengthening;15 reps;Seated Toe Raises: AROM;Strengthening;Right;Left;15 reps;Seated    General Comments        Pertinent Vitals/Pain Pain Assessment: No/denies pain    Home Living Family/patient expects to be discharged to:: Private residence Living Arrangements: Spouse/significant other Available Help at Discharge: Family;Available 24 hours/day Type of Home: House Home Access: Stairs to enter Entrance Stairs-Rails: None Home Layout: One level Home Equipment: None Additional Comments: pt states spouse with poor vision but otherwise in good health. Son able to assist as well if needed    Prior Function Level of Independence: Independent      Comments: pt reports she and spouse both cook, clean and drive   PT Goals (current goals can now be found in the  care plan section) Progress towards PT goals: Progressing toward goals    Frequency    Min 3X/week      PT Plan Current plan remains appropriate    Co-evaluation             End of Session Equipment Utilized During Treatment: Gait belt Activity Tolerance: Patient tolerated  treatment well;No increased pain;Patient limited by fatigue Patient left: in chair;with call bell/phone within reach;with chair alarm set;with nursing/sitter in room     Time: 0910-0933 PT Time Calculation (min) (ACUTE ONLY): 23 min  Charges:  $Gait Training: 8-22 mins $Therapeutic Exercise: 8-22 mins                    G Codes:      Daryus Sowash 2015/12/17, 10:25 AM Brevyn Ring D. Jyrah Blye SPT [phone]

## 2015-11-22 ENCOUNTER — Other Ambulatory Visit: Payer: Self-pay | Admitting: Internal Medicine

## 2015-11-22 LAB — GLUCOSE, CAPILLARY
Glucose-Capillary: 105 mg/dL — ABNORMAL HIGH (ref 65–99)
Glucose-Capillary: 112 mg/dL — ABNORMAL HIGH (ref 65–99)
Glucose-Capillary: 137 mg/dL — ABNORMAL HIGH (ref 65–99)
Glucose-Capillary: 125 mg/dL — ABNORMAL HIGH (ref 65–99)
Glucose-Capillary: 159 mg/dL — ABNORMAL HIGH (ref 65–99)

## 2015-11-22 MED ORDER — LISINOPRIL 5 MG PO TABS
5.0000 mg | ORAL_TABLET | Freq: Every day | ORAL | Status: DC
  Administered 2015-11-22 – 2015-11-25 (×4): 5 mg via ORAL
  Filled 2015-11-22 (×4): qty 1

## 2015-11-22 NOTE — Progress Notes (Signed)
CARDIAC REHAB PHASE I   PRE:  Rate/Rhythm: 116 ST  BP:  Sitting: 141/89        SaO2: 97 RA  MODE:  Ambulation: 250 ft   POST:  Rate/Rhythm: 131 ST  BP:  Sitting: 137/75         SaO2: 98 RA  Pt with somewhat affect this morning. No specific complaints, just states "I don't feel well." Pt stood with minimal assistance. Pt ambulated 250 ft on RA, rolling walker, gait belt, assist x2, fairly steady gait, tolerated fairly well. Pt does exhibit poor safety awareness, tends to rush/speed up when tired. Pt HR elevated 130s, some mild DOE.  Encouraged additional ambulation x2 today. Pt to recliner after walk, feet elevated, call bell within reach. Will follow.    [id number] Lenna Sciara, RN, BSN 11/22/2015 10:14 AM

## 2015-11-22 NOTE — Progress Notes (Addendum)
      [address]       [place] [address]             [phone]      6 Days Post-Op Procedure(s) (LRB): TRANSESOPHAGEAL ECHOCARDIOGRAM (TEE) (N/A) OFF PUMP CORONARY ARTERY BYPASS GRAFTING (CABG) x FOUR, USING LEFT MAMMARY ARTERY  AND ENDOSCOPICALLY HARVESTED RIGHT GREATER SAPHENOUS VEIN.   Subjective:  [name] [name] has no new complaints.  She is sleepy this morning, but woke up and was able to answer questions appropriately.  + ambulation  + BM  Objective: Vital signs in last 24 hours: Temp:  [98.2 F (36.8 C)-99.5 F (37.5 C)] 99.5 F (37.5 C) (11/15 0434) Pulse Rate:  [92-114] 105 (11/15 0434) Cardiac Rhythm: Sinus tachycardia (11/15 0718) Resp:  [17-20] 20 (11/15 0434) BP: (152-162)/(66-86) 155/79 (11/15 0434) SpO2:  [94 %-95 %] 94 % (11/15 0434) Weight:  [145 lb 12.8 oz (66.1 kg)] 145 lb 12.8 oz (66.1 kg) (11/15 0427)  Intake/Output from previous day: 11/14 0701 - 11/15 0700 In: 830 [P.O.:830] Out: 3550 [Urine:3550]  General appearance: alert, cooperative and no distress Heart: regular rate and rhythm Lungs: clear to auscultation bilaterally Abdomen: soft, non-tender; bowel sounds normal; no masses,  no organomegaly Extremities: edema trace, extensive ecchymosis entire RLE, into foot Wound: clean and dry  Lab Results:  Recent Labs  11/20/15 0500 11/21/15 0249  WBC 8.6 7.0  HGB 9.1* 9.1*  HCT 27.5* 27.6*  PLT 98* 142*   BMET:  Recent Labs  11/20/15 0500 11/21/15 0249  NA 142 142  K 3.6 3.8  CL 110 111  CO2 26 21*  GLUCOSE 76 96  BUN 8 8  CREATININE 0.62 0.75  CALCIUM 7.7* 7.8*    PT/INR: No results for input(s): LABPROT, INR in the last 72 hours. ABG    Component Value Date/Time   PHART 7.345 (L) 11/17/2015 0323   HCO3 20.4 11/17/2015 0323   TCO2 22 11/17/2015 1647   ACIDBASEDEF 5.0 (H) 11/17/2015 0323   O2SAT 95.0 11/17/2015 0323   CBG (last 3)   Recent Labs  11/21/15 1636 11/21/15 2058 11/22/15 0901  GLUCAP  102* 106* 112*    Assessment/Plan: S/P Procedure(s) (LRB): TRANSESOPHAGEAL ECHOCARDIOGRAM (TEE) (N/A) OFF PUMP CORONARY ARTERY BYPASS GRAFTING (CABG) x FOUR, USING LEFT MAMMARY ARTERY  AND ENDOSCOPICALLY HARVESTED RIGHT GREATER SAPHENOUS VEIN.  1. CV- Sinus Tach, remains Hypertensive with SBP ranging 150s-160s- Lopressor was increased last night to 50 mg BID, creatinine has been WNL, will start low dose ACE 2. Pulm- no acute issues, continue IS 3. Renal- creatinine has been WNL, not hypervolemic, no indication for lasix at this time 4. DM- uncontrolled preoperatively, good sugar control currently, continue current regimen 5. Dispo- patient stable, tachycardia, hypertensive- lopressor increased last night, will start low dose Lopressor, continue current care.. Needs better HR/BP control prior to discharge    LOS: 7 days    BARRETT, [name] [name] 11/22/2015 Patient seen and examined, agree with above She is more alert and conversant Will likely need SNF placement short term  Remo Lipps C. Roxan Hockey, MD [facility] [phone]

## 2015-11-22 NOTE — Progress Notes (Signed)
Patient lying in bed. Assisted to commode. Patient has been getting out of the bed even with the alarm on. Stressed the importance of waiting for assistance before getting up. Bed alarm on, call light within reach

## 2015-11-23 LAB — GLUCOSE, CAPILLARY
Glucose-Capillary: 136 mg/dL — ABNORMAL HIGH (ref 65–99)
Glucose-Capillary: 162 mg/dL — ABNORMAL HIGH (ref 65–99)
Glucose-Capillary: 212 mg/dL — ABNORMAL HIGH (ref 65–99)
Glucose-Capillary: 119 mg/dL — ABNORMAL HIGH (ref 65–99)

## 2015-11-23 NOTE — Progress Notes (Signed)
Physical Therapy Treatment Patient Details Name: [name] [name] MRN: [id number] DOB: 1931-03-17 Today's Date: 11/23/2015    History of Present Illness pt presents post CABG x4.  pt with hx of Anemia, Depression, DM, HTN, CKD, and CAD.      PT Comments    Pt had ambulated earlier with cardiac rehab and not sure if that is why pt kept saying she felt weak, but she did walk 150' with RW.  If family can provide 24 hour A then recommend home with HHPT.  If they are unable to, then will need to recommend SNF.  Pt needing cues throughout session to maintain sternal precautions.  Follow Up Recommendations  Home health PT;Supervision/Assistance - 24 hour     Equipment Recommendations  Rolling walker with 5" wheels    Recommendations for Other Services       Precautions / Restrictions Precautions Precautions: Fall;Sternal Precaution Comments: reviewed sternal precautions Restrictions Weight Bearing Restrictions: No    Mobility  Bed Mobility               General bed mobility comments: Pt sitting EOB trying to get OOB to [facility]  Transfers Overall transfer level: Needs assistance Equipment used: Rolling walker (2 wheeled) Transfers: Sit to/from Stand Sit to Stand: Min guard Stand pivot transfers: Min guard       General transfer comment: cues for hand placement for sternal precautions  Ambulation/Gait Ambulation/Gait assistance: Min guard Ambulation Distance (Feet): 150 Feet Assistive device: Rolling walker (2 wheeled) Gait Pattern/deviations: Step-through pattern     General Gait Details: Pt with step through pattern and did not attempt to leave RW this gait session.  Pt just kept repeating, " I'm just so weak." HR 117 bpm after gait     Stairs            Wheelchair Mobility    Modified Rankin (Stroke Patients Only)       Balance     Sitting balance-Leahy Scale: Good     Standing balance support: Bilateral upper extremity supported Standing  balance-Leahy Scale: Poor                      Cognition Arousal/Alertness: Awake/alert Behavior During Therapy: Flat affect Overall Cognitive Status: No family/caregiver present to determine baseline cognitive functioning   Orientation Level: Disoriented to;Time;Situation   Memory: Decreased recall of precautions;Decreased short-term memory Following Commands: Follows one step commands with increased time Safety/Judgement: Decreased awareness of safety          Exercises      General Comments        Pertinent Vitals/Pain Pain Assessment: Faces Faces Pain Scale: No hurt    Home Living                      Prior Function            PT Goals (current goals can now be found in the care plan section) Acute Rehab PT Goals PT Goal Formulation: With patient Time For Goal Achievement: 12/03/15 Potential to Achieve Goals: Good Progress towards PT goals: Progressing toward goals    Frequency    Min 3X/week      PT Plan Current plan remains appropriate    Co-evaluation             End of Session Equipment Utilized During Treatment: Gait belt Activity Tolerance: Patient tolerated treatment well;No increased pain;Patient limited by fatigue Patient left: in bed;with call bell/phone within  reach;with bed alarm set (sitting up eating lunch)     Time: [id number] PT Time Calculation (min) (ACUTE ONLY): 11 min  Charges:  $Gait Training: 8-22 mins                    G Codes:      Erionna Strum LUBECK 11/23/2015, 2:06 PM

## 2015-11-23 NOTE — Progress Notes (Addendum)
      [address]       [place] [address]             [phone]      7 Days Post-Op Procedure(s) (LRB): TRANSESOPHAGEAL ECHOCARDIOGRAM (TEE) (N/A) OFF PUMP CORONARY ARTERY BYPASS GRAFTING (CABG) x FOUR, USING LEFT MAMMARY ARTERY  AND ENDOSCOPICALLY HARVESTED RIGHT GREATER SAPHENOUS VEIN.   Subjective:  [name] [name] has no new complaints.  She continues to be sleepy.  She is ambulating without much difficulty.  She does agree to SNF placement if possible, but does have her husband to help at home  Objective: Vital signs in last 24 hours: Temp:  [97.4 F (36.3 C)-98.2 F (36.8 C)] 98 F (36.7 C) (11/16 0303) Pulse Rate:  [101-110] 110 (11/16 0303) Cardiac Rhythm: Sinus tachycardia (11/15 1900) Resp:  [18] 18 (11/16 0303) BP: (112-141)/(65-89) 112/65 (11/16 0303) SpO2:  [96 %-98 %] 97 % (11/16 0303) Weight:  [141 lb 1.6 oz (64 kg)] 141 lb 1.6 oz (64 kg) (11/16 0303)  Intake/Output from previous day: 11/15 0701 - 11/16 0700 In: 480 [P.O.:480] Out: 1650 [Urine:1650]  General appearance: alert, cooperative and no distress Heart: regular rate and rhythm Lungs: clear to auscultation bilaterally Abdomen: soft, non-tender; bowel sounds normal; no masses,  no organomegaly Extremities: edema trace, ecchymosis entire RLE Wound: clean and dry  Lab Results:  Recent Labs  11/21/15 0249  WBC 7.0  HGB 9.1*  HCT 27.6*  PLT 142*   BMET:  Recent Labs  11/21/15 0249  NA 142  K 3.8  CL 111  CO2 21*  GLUCOSE 96  BUN 8  CREATININE 0.75  CALCIUM 7.8*    PT/INR: No results for input(s): LABPROT, INR in the last 72 hours. ABG    Component Value Date/Time   PHART 7.345 (L) 11/17/2015 0323   HCO3 20.4 11/17/2015 0323   TCO2 22 11/17/2015 1647   ACIDBASEDEF 5.0 (H) 11/17/2015 0323   O2SAT 95.0 11/17/2015 0323   CBG (last 3)   Recent Labs  11/22/15 1644 11/22/15 2143 11/23/15 0609  GLUCAP 125* 159* 136*    Assessment/Plan: S/P Procedure(s)  (LRB): TRANSESOPHAGEAL ECHOCARDIOGRAM (TEE) (N/A) OFF PUMP CORONARY ARTERY BYPASS GRAFTING (CABG) x FOUR, USING LEFT MAMMARY ARTERY  AND ENDOSCOPICALLY HARVESTED RIGHT GREATER SAPHENOUS VEIN.  1. CV- Sinus Tach, HTN improved- continue Lopressor, Lisinopril 2. Pulm- no acute issues, continue IS 3. Renal- creatinine WNL, weight remains stable 4. DM- sugars are well controlled 5. Deconditioning- it is felt she may benefit from SNF, however PT recs are for H/H... She does have help at home, however she is agreeable to SNF placement if it can be arranged... Will consult CSW.. If unable to arrange, H/H orders are placed 6. Dispo- patient stable, remains tachycardic, BP improved, continue Lopressor, Lisinopril, CSW consult for possible SNF placement   LOS: 8 days    [name], [name] 11/23/2015 Patient seen and examined, agree with above Her husband is at home but is not in good health himself. Would benefit from [facility] C. Roxan Hockey, MD [facility] [phone]

## 2015-11-23 NOTE — Progress Notes (Signed)
CARDIAC REHAB PHASE I   PRE:  Rate/Rhythm: 112 ST PVCs  BP:  Supine:   Sitting: 144/66  Standing:    SaO2: 96%RA  MODE:  Ambulation: 300 ft   POST:  Rate/Rhythm: 131 ST PVCs  BP:  Supine:   Sitting: 123/75  Standing:    SaO2: 97%RA 0920-050 Pt walked 300 ft on RA with gait belt use, rolling walker and asst x 2. Stopped a couple of times to rest. Encouraged pursed lip breathing. To [facility] and then to recliner with call bell. Heart rate elevated with PVCs. PT to see today also.  Graylon Good, RN BSN  11/23/2015 9:45 AM

## 2015-11-23 NOTE — Progress Notes (Signed)
Pt refused to walk and stated she will try later on. Will continue to monitor.

## 2015-11-23 NOTE — Care Management Important Message (Signed)
Important Message  Patient Details  Name: [name] [name] MRN: [id number] Date of Birth: 12/20/1931   Medicare Important Message Given:  Yes    Nathen May 11/23/2015, 9:29 AM

## 2015-11-24 LAB — GLUCOSE, CAPILLARY
Glucose-Capillary: 147 mg/dL — ABNORMAL HIGH (ref 65–99)
Glucose-Capillary: 161 mg/dL — ABNORMAL HIGH (ref 65–99)
Glucose-Capillary: 141 mg/dL — ABNORMAL HIGH (ref 65–99)
Glucose-Capillary: 174 mg/dL — ABNORMAL HIGH (ref 65–99)

## 2015-11-24 NOTE — Progress Notes (Addendum)
      [address]       [place] [address]             [phone]      8 Days Post-Op Procedure(s) (LRB): TRANSESOPHAGEAL ECHOCARDIOGRAM (TEE) (N/A) OFF PUMP CORONARY ARTERY BYPASS GRAFTING (CABG) x FOUR, USING LEFT MAMMARY ARTERY  AND ENDOSCOPICALLY HARVESTED RIGHT GREATER SAPHENOUS VEIN.   Subjective:  [name] [name] is again sleepy this morning.  She has no complaints.  She is not sure if social work came to speak with her yesterday.  Objective: Vital signs in last 24 hours: Temp:  [98.3 F (36.8 C)-98.9 F (37.2 C)] 98.3 F (36.8 C) (11/17 0442) Pulse Rate:  [90-123] 104 (11/17 0442) Cardiac Rhythm: Sinus tachycardia (11/16 1930) Resp:  [17-20] 20 (11/17 0442) BP: (121-140)/(69-87) 127/87 (11/17 0442) SpO2:  [95 %-97 %] 95 % (11/17 0442) Weight:  [140 lb 10.5 oz (63.8 kg)] 140 lb 10.5 oz (63.8 kg) (11/17 0442)  Intake/Output from previous day: 11/16 0701 - 11/17 0700 In: 300 [P.O.:300] Out: 950 [Urine:950]  General appearance: alert, cooperative and no distress Heart: regular rate and rhythm Lungs: clear to auscultation bilaterally Abdomen: soft, non-tender; bowel sounds normal; no masses,  no organomegaly Extremities: edema trace, RLE ecchysmosis stabel Wound: clean and dry  Lab Results: No results for input(s): WBC, HGB, HCT, PLT in the last 72 hours. BMET: No results for input(s): NA, K, CL, CO2, GLUCOSE, BUN, CREATININE, CALCIUM in the last 72 hours.  PT/INR: No results for input(s): LABPROT, INR in the last 72 hours. ABG    Component Value Date/Time   PHART 7.345 (L) 11/17/2015 0323   HCO3 20.4 11/17/2015 0323   TCO2 22 11/17/2015 1647   ACIDBASEDEF 5.0 (H) 11/17/2015 0323   O2SAT 95.0 11/17/2015 0323   CBG (last 3)   Recent Labs  11/23/15 1621 11/23/15 2042 11/24/15 0621  GLUCAP 212* 119* 147*    Assessment/Plan: S/P Procedure(s) (LRB): TRANSESOPHAGEAL ECHOCARDIOGRAM (TEE) (N/A) OFF PUMP CORONARY ARTERY BYPASS GRAFTING  (CABG) x FOUR, USING LEFT MAMMARY ARTERY  AND ENDOSCOPICALLY HARVESTED RIGHT GREATER SAPHENOUS VEIN.  1. CV- Sinus Tach, continues to have PVCs- BP improved, continue Lisinopril, Lopressor 2. Pulm- no acute issues, off oxygen, continue IS 3. Renal- creatinine has been WNL, weight is stable, no diuretics 4. Deconditioning- patient does not have adequate assistance at home will need SNF placement, consult placed yesterday, however it appears patient has not been evaluated by CSW.. I will contact them today  5. Dispo- patient stable, needs SNF placement, continue current care   LOS: 9 days    [name] [name] 11/24/2015 Patient seen and examined, agree with above To SNF when bed available  Remo Lipps C. Roxan Hockey, MD [facility] [phone]

## 2015-11-24 NOTE — Clinical Social Work Note (Signed)
CSW spoke with PA regarding SNF placement. She stated that patient is capable of making her own decisions. Per RNCM, patient was willing to consider SNF due to lack of support at home. CSW met with patient. No supports at bedside. Introduced SNF option. Patient wants to go home. She lives there with her husband and says her daughter-in-law lives across the street. Patient stated she has a son but does not know his phone number. Patient stated he does not live across the street but lives close enough where he can visit frequently. CSW asked multiple times if she was sure she did not want to go to SNF and each time patient replied that she wanted to go home. Patient walked 300 feet in cardiac rehab and 150 feet in PT yesterday. Patient walked 350 feet today in cardiac rehab.   RNCM and PA notified.  CSW signing off. Consult again if any social work needs arise.

## 2015-11-24 NOTE — Progress Notes (Signed)
CARDIAC REHAB PHASE I   PRE:  Rate/Rhythm: 102 ST  BP:  Supine: 132/66  Sitting:   Standing:    SaO2: 96%RA  MODE:  Ambulation: 350 ft   POST:  Rate/Rhythm: 116 ST  BP:  Supine:   Sitting: 105/67  Standing:    SaO2: 99%RA 1055-1125 When asked pt questions she states I don"t know. Perked up a little after walk. Walked 350 ft on RA with gait belt use, rolling walker and asst x 1. Pt does not hold to walker until she reaches recliner as we have to remind her to not let go of it before reaching chair. Pt will need family here for education.    Graylon Good, RN BSN  11/24/2015 11:21 AM

## 2015-11-24 NOTE — Care Management Note (Addendum)
Case Management Note Marvetta Gibbons RN, BSN Unit 2W-Case Manager [phone]  Patient Details  Name: [name] [name] MRN: [id number] Date of Birth: [birth year]  Subjective/Objective:   Pt admitted with [place], s/p CABG x4 11/9-  tx from ICU to 2W on 11/20/15                Action/Plan: PTA pt lived at home with spouse- order placed for HHRN/PT, and DME - spoke with pt at bedside to offer choice- list provided for [facility] agencies- pt would like to look over list - CM will f/u with pt prior to discharge for choice- notified Jermaine with [facility] for DME needs- RW and BSC will be delivered prior to discharge.   Expected Discharge Date:                  Expected Discharge Plan:  Skilled Nursing Facility  In-House Referral:  Clinical Social Work  Discharge planning Services  CM Consult  Post Acute Care Choice:  Home Health Choice offered to:  Patient  DME Arranged:   rolling walker, elevated toliet DME Agency:   [facility]  HH Arranged:  RN, PT [facility] Agency:   [place]  Status of Service:  In process, will continue to follow  If discussed at Long Length of Stay Meetings, dates discussed:  11/16  Additional Comments:  11/24/15- 1100- Marvetta Gibbons RN, CM- again spoke with pt at bedside regarding d/c plans- pt again reports that her husband is at home and can help her- asked about his health she states "it's ok"- asked her if she would be agreeable to STSNF- she states "maybe" - will have CSW come to speak with pt and look at placement options-- pt has to be agreeable to placement for STSNF-  If pt not agreeable will set up [facility] with agency of choice and arrange DME.   1430- per CSW- pt was not agreeable to STSNF- wants to return home- back in to speak with pt about HH choice- per pt she does not have a preference -reviewed list with pt- and pt would like to use [facility] for services- referral called to Santiago Glad with [facility] for HHRN/PT- have also notified Jermaine with [facility] of DME  needs- to be delivered to room prior to discharge.   11/23/15- 1230- Maurie Musco RN, CM- follow up done with pt regarding HH needs vs STSNF- noted MD note in chart regarding possible need for placement- per pt she has not decided on [facility] agency at this time- discussed [place] needs- pt states that her husband is at home does not have any one else- pt states that she wants to go home and does not want to go to rehab-pt walking 300 + ft with cardiac rehab. Will f/u again tomorrow.   Dawayne Patricia, RN 11/24/2015, 10:56 AM

## 2015-11-25 LAB — GLUCOSE, CAPILLARY
Glucose-Capillary: 164 mg/dL — ABNORMAL HIGH (ref 65–99)
Glucose-Capillary: 147 mg/dL — ABNORMAL HIGH (ref 65–99)

## 2015-11-25 MED ORDER — LISINOPRIL 2.5 MG PO TABS
2.5000 mg | ORAL_TABLET | Freq: Every day | ORAL | 3 refills | Status: DC
Start: 2015-11-25 — End: 2016-04-03

## 2015-11-25 MED ORDER — TRAMADOL HCL 50 MG PO TABS: 50.0000 mg | ORAL_TABLET | Freq: Four times a day (QID) | ORAL | 0 refills | Status: DC | PRN

## 2015-11-25 MED ORDER — METOPROLOL TARTRATE 50 MG PO TABS: 50.0000 mg | ORAL_TABLET | Freq: Two times a day (BID) | ORAL | 3 refills | Status: DC

## 2015-11-25 MED ORDER — ASPIRIN 325 MG PO TBEC: 325.0000 mg | DELAYED_RELEASE_TABLET | Freq: Every day | ORAL | 0 refills | Status: DC

## 2015-11-25 MED ORDER — LISINOPRIL 2.5 MG PO TABS: 2.5000 mg | ORAL_TABLET | Freq: Every day | ORAL | 3 refills | Status: DC

## 2015-11-25 NOTE — Progress Notes (Signed)
Pt/family given discharge instructions, medication lists, follow up appointments, and when to call the doctor.  Pt/family verbalizes understanding. Pt given signs and symptoms of infection. Tegh Franek McClintock, RN    

## 2015-11-25 NOTE — Progress Notes (Addendum)
      [address]       [place] [address]             [phone]      9 Days Post-Op Procedure(s) (LRB): TRANSESOPHAGEAL ECHOCARDIOGRAM (TEE) (N/A) OFF PUMP CORONARY ARTERY BYPASS GRAFTING (CABG) x FOUR, USING LEFT MAMMARY ARTERY  AND ENDOSCOPICALLY HARVESTED RIGHT GREATER SAPHENOUS VEIN.   Subjective:  No new complaints.  Patient declined SNF placement.  She wants to go home and states her son and husband will be able to provide care.  She is ambulating  + BM  Objective: Vital signs in last 24 hours: Temp:  [98.5 F (36.9 C)-99.3 F (37.4 C)] 99.3 F (37.4 C) (11/18 0449) Pulse Rate:  [104-107] 107 (11/18 0449) Cardiac Rhythm: Sinus tachycardia (11/17 1900) Resp:  [18-20] 18 (11/18 0449) BP: (93-137)/(55-80) 123/65 (11/18 0449) SpO2:  [95 %-96 %] 95 % (11/18 0449) Weight:  [140 lb 14 oz (63.9 kg)] 140 lb 14 oz (63.9 kg) (11/18 0449)  Intake/Output from previous day: 11/17 0701 - 11/18 0700 In: 720 [P.O.:720] Out: 1700 [Urine:1700] Intake/Output this shift: Total I/O In: -  Out: 1000 [Urine:1000]  General appearance: alert, cooperative and no distress Heart: regular rate and rhythm Lungs: clear to auscultation bilaterally Abdomen: soft, non-tender; bowel sounds normal; no masses,  no organomegaly Extremities: edema trace, RLE remains ecchymotic but unchanged Wound: clean and dry  Lab Results: No results for input(s): WBC, HGB, HCT, PLT in the last 72 hours. BMET: No results for input(s): NA, K, CL, CO2, GLUCOSE, BUN, CREATININE, CALCIUM in the last 72 hours.  PT/INR: No results for input(s): LABPROT, INR in the last 72 hours. ABG    Component Value Date/Time   PHART 7.345 (L) 11/17/2015 0323   HCO3 20.4 11/17/2015 0323   TCO2 22 11/17/2015 1647   ACIDBASEDEF 5.0 (H) 11/17/2015 0323   O2SAT 95.0 11/17/2015 0323   CBG (last 3)   Recent Labs  11/24/15 1112 11/24/15 1621 11/24/15 2003  GLUCAP 161* 141* 174*    Assessment/Plan: S/P  Procedure(s) (LRB): TRANSESOPHAGEAL ECHOCARDIOGRAM (TEE) (N/A) OFF PUMP CORONARY ARTERY BYPASS GRAFTING (CABG) x FOUR, USING LEFT MAMMARY ARTERY  AND ENDOSCOPICALLY HARVESTED RIGHT GREATER SAPHENOUS VEIN.  1. CV- Sinus Tach, BP improved- continue Lopressor, Lisinopril 2. Pulm- no acute issues, continue IS 3. Reanl- weight remains stable, no Lasix indicated 4. Deconditioning- declined SNF, ambulating with minimal assistance 5. Dispo- patient is stable, discharge instructions provided, will d/c home today   LOS: 10 days    BARRETT, ERIN 11/25/2015  I have seen and examined the patient and agree with the assessment and plan as outlined.  Rexene Alberts, MD 11/25/2015 10:47 AM

## 2015-11-25 NOTE — Progress Notes (Signed)
For discharge home today.  Education done with son, patient, and husband re: sternal precautions, activity restrictions, exercise progression, signs of infection, daily weights, and phase II cardiac rehab.  Patient does not seem interested in education, so it was focused more to the son.  She has no appetite and made many suggestions in how to get protein and fruits and vegetables to help with strength and healing.  Referred to [facility] cardiac rehab.   [phone]

## 2015-11-25 NOTE — Progress Notes (Signed)
Patient  refused to walk tonight.

## 2015-11-25 NOTE — Progress Notes (Signed)
Removed CT sutures and applied benzoin and 1/2 " steri strips.  Pt tolerated procedure well. Pt given signs and symptoms of infection. Jaydalee Bardwell McClintock, RN    

## 2015-11-28 DIAGNOSIS — Z951 Presence of aortocoronary bypass graft: Secondary | ICD-10-CM | POA: Diagnosis not present

## 2015-11-28 DIAGNOSIS — Z48812 Encounter for surgical aftercare following surgery on the circulatory system: Secondary | ICD-10-CM | POA: Diagnosis not present

## 2015-11-29 ENCOUNTER — Ambulatory Visit: Payer: Self-pay | Admitting: Internal Medicine

## 2015-12-05 ENCOUNTER — Encounter: Payer: Self-pay | Admitting: Physician Assistant

## 2015-12-08 ENCOUNTER — Other Ambulatory Visit: Payer: Self-pay | Admitting: *Deleted

## 2015-12-08 MED ORDER — BLOOD GLUCOSE MONITOR KIT: PACK | 0 refills | Status: DC

## 2015-12-09 NOTE — Progress Notes (Deleted)
Cardiology Office Note    Date:  12/09/2015   ID:  [name] [name], DOB 1931-11-14, MRN [id number]  PCP:  Alesia Richards, MD  Cardiologist:  Dr. Marlou Porch  CC: follow up s/p CABG  History of Present Illness:  [name] [name] is a 82 y.o. female with a history of DMT2, anemia, CKD, hypothyroidism, GERD, HTN and recently diagnosed CAD s/p CABG and ischemic CM who presents to clinic for follow up.  She was seen by Dr. Acie Fredrickson in the office for unstable angina. Cath on 11/15/15 showed severe multivessel CAD with EF 35-40%.   She was admitted 11/8-11/1815. She ultimately underwent CABG x4V (LIMA--> LAD, SVG--> RI, sVG--> OM1, SVG--> post descending) by Dr. Roxan Hockey on 11/16/15. Intraoperative TEE showed EF 25-30% which imrpved to 40% after bypass, severe septal hypokinesis and mild-mod TR. She required 2U PRBCS. Otherwise hospital course uncomplicated and she was discharged to SNF.   Today she presents to clinic for follow up.   Past Medical History:  Diagnosis Date  . Anemia   . Anginal pain ([place])   . Cataract   . CKD stage 2 due to type 2 diabetes mellitus ([place])   . Colon polyp 2009    TUBULAR ADENOMA  . Coronary artery disease   . Depression   . Diabetes ([place])   . Diverticulosis of colon (without mention of hemorrhage) 2009  . Esophagitis 2009  . Gastritis 2009  . GERD (gastroesophageal reflux disease) 2009  . Hiatal hernia 2009  . Hyperlipemia   . Hypertension   . Thyroid disease    hypothyroid    Past Surgical History:  Procedure Laterality Date  . BREAST BIOPSY Left 1999   neg  . CARDIAC CATHETERIZATION N/A 11/15/2015   Procedure: Left Heart Cath and Coronary Angiography;  Surgeon: Troy Sine, MD;  Location: [facility];  Service: Cardiovascular;  Laterality: N/A;  . COLONOSCOPY  05/2012   Dr. Sharlett Iles  . CORONARY ARTERY BYPASS GRAFT  11/16/2015   Procedure: OFF PUMP CORONARY ARTERY BYPASS GRAFTING (CABG) x FOUR, USING LEFT MAMMARY ARTERY  AND  ENDOSCOPICALLY HARVESTED RIGHT GREATER SAPHENOUS VEIN.;  Surgeon: Melrose Nakayama, MD;  Location: [facility];  Service: Open Heart Surgery;;  . DILATION AND CURETTAGE OF UTERUS  1984  . TEE WITHOUT CARDIOVERSION N/A 11/16/2015   Procedure: TRANSESOPHAGEAL ECHOCARDIOGRAM (TEE);  Surgeon: Melrose Nakayama, MD;  Location: [facility];  Service: Open Heart Surgery;  Laterality: N/A;    Current Medications: Outpatient Medications Prior to Visit  Medication Sig Dispense Refill  . aspirin EC 325 MG EC tablet Take 1 tablet (325 mg total) by mouth daily. 30 tablet 0  . atorvastatin (LIPITOR) 80 MG tablet TAKE 1 TABLET DAILY FOR CHOLESTEROL (Patient taking differently: TAKE 80 MG DAILY FOR CHOLESTEROL) 90 tablet 1  . blood glucose meter kit and supplies KIT Dispense based on patient and insurance preference. Check blood sugar 1 time daily-DX-E11.22. 1 each 0  . lisinopril (PRINIVIL,ZESTRIL) 2.5 MG tablet Take 1 tablet (2.5 mg total) by mouth daily. 30 tablet 3  . lubiprostone (AMITIZA) 24 MCG capsule Take 24 mcg by mouth daily.    . metFORMIN (GLUCOPHAGE-XR) 500 MG 24 hr tablet TAKE 2 TABLETS TWICE A DAY 360 tablet 1  . metoprolol (LOPRESSOR) 50 MG tablet Take 1 tablet (50 mg total) by mouth 2 (two) times daily. 60 tablet 3  . mirtazapine (REMERON) 15 MG tablet Take 1 tablet (15 mg total) by mouth at bedtime. 30 tablet  2  . nitroGLYCERIN (NITROSTAT) 0.4 MG SL tablet Dissolve 1 tablet under tongue every 3 to 5 minutes if needed for ChestPain 50 tablet 12  . polyethylene glycol (MIRALAX / GLYCOLAX) packet Take 17 g by mouth daily. (Patient not taking: Reported on 11/15/2015) 100 each 0  . traMADol (ULTRAM) 50 MG tablet Take 1-2 tablets (50-100 mg total) by mouth every 6 (six) hours as needed for moderate pain. 30 tablet 0   No facility-administered medications prior to visit.      Allergies:   Bee venom; Persantine [dipyridamole]; and Actos [pioglitazone]   Social History   Social History  . Marital  status: Married    Spouse name: N/A  . Number of children: 3  . Years of education: N/A   Social History Main Topics  . Smoking status: Never Smoker  . Smokeless tobacco: Current User    Types: Snuff  . Alcohol use No  . Drug use: No  . Sexual activity: Not on file   Other Topics Concern  . Not on file   Social History Narrative  . No narrative on file     Family History:  The patient's family history includes Breast cancer in her maternal aunt; Diabetes in her father; Lung cancer in her brother; Prostate cancer in her brother; Stroke in her mother; Throat cancer in her maternal uncle.     ROS:   Please see the history of present illness.    ROS All other systems reviewed and are negative.   PHYSICAL EXAM:   VS:  LMP  (LMP Unknown)    GEN: Well nourished, well developed, in no acute distress  HEENT: normal  Neck: no JVD, carotid bruits, or masses Cardiac: ***RRR; no murmurs, rubs, or gallops,no edema  Respiratory:  clear to auscultation bilaterally, normal work of breathing GI: soft, nontender, nondistended, + BS MS: no deformity or atrophy  Skin: warm and dry, no rash Neuro:  Alert and Oriented x 3, Strength and sensation are intact Psych: euthymic mood, full affect  Wt Readings from Last 3 Encounters:  11/25/15 140 lb 14 oz (63.9 kg)  11/10/15 145 lb (65.8 kg)  11/09/15 144 lb 12.8 oz (65.7 kg)      Studies/Labs Reviewed:   EKG:  EKG is*** ordered today.  The ekg ordered today demonstrates ***  Recent Labs: 10/31/2015: TSH 0.77 11/17/2015: Magnesium 1.9 11/20/2015: ALT 17 11/21/2015: BUN 8; Creatinine, Ser 0.75; Hemoglobin 9.1; Platelets 142; Potassium 3.8; Sodium 142   Lipid Panel    Component Value Date/Time   CHOL 130 10/31/2015 1134   TRIG 109 10/31/2015 1134   HDL 52 10/31/2015 1134   CHOLHDL 2.5 10/31/2015 1134   VLDL 22 10/31/2015 1134   LDLCALC 56 10/31/2015 1134    Additional studies/ records that were reviewed today include:     11/15/15 Left Heart Cath and Coronary Angiography  Conclusion     LV end diastolic pressure is normal.  There is moderate left ventricular systolic dysfunction.  Ramus lesion, 80 %stenosed.  Ost Ramus lesion, 70 %stenosed.  Ost LAD lesion, 80 %stenosed.  Prox LAD to Mid LAD lesion, 50 %stenosed.  Mid LAD lesion, 95 %stenosed.  Dist LAD lesion, 30 %stenosed.  Ost Cx lesion, 85 %stenosed.  Prox Cx lesion, 95 %stenosed.  Ost RCA lesion, 95 %stenosed.  Prox RCA lesion, 95 %stenosed.  Mid RCA lesion, 60 %stenosed.   Moderate LV dysfunction with an ejection fraction of 35-40% with a focal area of upper mid anterolateral  hypocontractility and mid to basal inferior hypokinesis.  Severely calcified coronary arteries with severe multivessel CAD with 80% ostial LAD stenosis followed by diffuse narrowing of 50%.  Prior to a 95% proximal to mid stenosis with 30% mid distal stenosis; 70% ostial 80% proximal ramus intermediate stenosis; 80% proximal followed by 95% left circumflex stenosis; an ostial 95% RCA stenosis followed by proximal 95% and mid 60% stenoses.  RECOMMENDATION: Surgical consultation for CABG revascularization surgery.     Intraoperative TEE 11/16/15 Conclusions Result status: Final result    Left ventricle: Normal cavity size and wall thickness. LV systolic function is severely reduced with an EF of 25-30%. Wall motion is abnormal. Inferior wall motion is hypokinetic. The septal wall is severely hypokinetic.  Aortic valve: The valve is trileaflet. Mild valve thickening present. No stenosis. Mild regurgitation.  Mitral valve: No leaflet thickening and calcification present. Mild regurgitation.  Tricuspid valve: Mild to moderate regurgitation. The tricuspid valve regurgitation jet is directed toward septum.   Post Coronary Bypass grafting, the inferior wall contractility is improving.  The septum remains severely hypokinetic. The overall EF has  improved to approximately 40%.        ASSESSMENT & PLAN:   CAD s/p CABG x4: continue ASA, statin and BB.   HTN: BP *** today.   Ischemic CM: Intraoperative TEE showed EF 25-30% which imrpved to 40% after bypass, severe septal hypokinesis and mild-mod TR. Continue BB and ACE. No s/s CHF ***.   HLD: LDL excellent at 56 on 10/31/15. Continue statin   TR: mild to mod. Follow   Medication Adjustments/Labs and Tests Ordered: Current medicines are reviewed at length with the patient today.  Concerns regarding medicines are outlined above.  Medication changes, Labs and Tests ordered today are listed in the Patient Instructions below. There are no Patient Instructions on file for this visit.   Signed, Angelena Form, PA-C  12/09/2015 [facility] [place], [place], [place]  [address] Phone: [phone]; Fax: [phone]

## 2015-12-12 ENCOUNTER — Encounter: Payer: Medicare Other | Admitting: Physician Assistant

## 2015-12-18 ENCOUNTER — Other Ambulatory Visit: Payer: Self-pay | Admitting: Thoracic Surgery (Cardiothoracic Vascular Surgery)

## 2015-12-18 DIAGNOSIS — Z951 Presence of aortocoronary bypass graft: Secondary | ICD-10-CM

## 2015-12-18 NOTE — Progress Notes (Signed)
Cardiology Office Note    Date:  12/20/2015   ID:  [name] [name], DOB 02-27-1931, MRN [id number]  PCP:  Alesia Richards, MD  Cardiologist: Dr. Marlou Porch   CC: post hosp follow up- CABG  History of Present Illness:  [name] [name] is a 82 y.o. female with a history of DMT2, anemia, CKD, hypothyroidism, GERD, HTN and recently diagnosed CAD s/p CABG x4V and ischemic CM who presents to clinic for follow up.    She was seen by Dr. Acie Fredrickson in the office for unstable angina. Cath on 11/15/15 showed severe multivessel CAD with EF 35-40%.    She was admitted 11/8-11/18/17. She ultimately underwent CABG x4V (LIMA--> LAD, SVG--> RI, sVG--> OM1, SVG--> post descending) by Dr. Roxan Hockey on 11/16/15. Intraoperative TEE showed EF 25-30% which imrpved to 40% after bypass, severe septal hypokinesis and mild-mod TR. She required 2U PRBCS. Otherwise hospital course uncomplicated and she was discharged to home with home health RN.    Today she presents to clinic for follow up. No CP or SOB. She does walk around the house but no formal exercise. No exertional chest pain or SOB. Some mild LE edema where they harvested the vein. No other LE edema, no orthopnea or PND. No dizziness or syncope. No blood in stool or urine. No palpitations.     Past Medical History:  Diagnosis Date  . Anemia   . Anginal pain ([place])   . Cataract   . CKD stage 2 due to type 2 diabetes mellitus ([place])   . Colon polyp 2009    TUBULAR ADENOMA  . Coronary artery disease   . Depression   . Diabetes ([place])   . Diverticulosis of colon (without mention of hemorrhage) 2009  . Esophagitis 2009  . Gastritis 2009  . GERD (gastroesophageal reflux disease) 2009  . Hiatal hernia 2009  . Hyperlipemia   . Hypertension   . Thyroid disease    hypothyroid    Past Surgical History:  Procedure Laterality Date  . BREAST BIOPSY Left 1999   neg  . CARDIAC CATHETERIZATION N/A 11/15/2015   Procedure: Left Heart Cath and Coronary  Angiography;  Surgeon: Troy Sine, MD;  Location: [facility];  Service: Cardiovascular;  Laterality: N/A;  . COLONOSCOPY  05/2012   Dr. Sharlett Iles  . CORONARY ARTERY BYPASS GRAFT  11/16/2015   Procedure: OFF PUMP CORONARY ARTERY BYPASS GRAFTING (CABG) x FOUR, USING LEFT MAMMARY ARTERY  AND ENDOSCOPICALLY HARVESTED RIGHT GREATER SAPHENOUS VEIN.;  Surgeon: Melrose Nakayama, MD;  Location: [facility];  Service: Open Heart Surgery;;  . DILATION AND CURETTAGE OF UTERUS  1984  . TEE WITHOUT CARDIOVERSION N/A 11/16/2015   Procedure: TRANSESOPHAGEAL ECHOCARDIOGRAM (TEE);  Surgeon: Melrose Nakayama, MD;  Location: [facility];  Service: Open Heart Surgery;  Laterality: N/A;    Current Medications: Outpatient Medications Prior to Visit  Medication Sig Dispense Refill  . aspirin EC 325 MG EC tablet Take 1 tablet (325 mg total) by mouth daily. 30 tablet 0  . blood glucose meter kit and supplies KIT Dispense based on patient and insurance preference. Check blood sugar 1 time daily-DX-E11.22. 1 each 0  . lisinopril (PRINIVIL,ZESTRIL) 2.5 MG tablet Take 1 tablet (2.5 mg total) by mouth daily. 30 tablet 3  . lubiprostone (AMITIZA) 24 MCG capsule Take 24 mcg by mouth daily.    . metoprolol (LOPRESSOR) 50 MG tablet Take 1 tablet (50 mg total) by mouth 2 (two) times daily. 60 tablet 3  .  mirtazapine (REMERON) 15 MG tablet Take 1 tablet (15 mg total) by mouth at bedtime. 30 tablet 2  . nitroGLYCERIN (NITROSTAT) 0.4 MG SL tablet Dissolve 1 tablet under tongue every 3 to 5 minutes if needed for ChestPain 50 tablet 12  . atorvastatin (LIPITOR) 80 MG tablet TAKE 1 TABLET DAILY FOR CHOLESTEROL (Patient not taking: Reported on 12/20/2015) 90 tablet 1  . metFORMIN (GLUCOPHAGE-XR) 500 MG 24 hr tablet TAKE 2 TABLETS TWICE A DAY (Patient not taking: Reported on 12/20/2015) 360 tablet 1  . polyethylene glycol (MIRALAX / GLYCOLAX) packet Take 17 g by mouth daily. (Patient not taking: Reported on 12/20/2015) 100 each 0    . traMADol (ULTRAM) 50 MG tablet Take 1-2 tablets (50-100 mg total) by mouth every 6 (six) hours as needed for moderate pain. (Patient not taking: Reported on 12/20/2015) 30 tablet 0   No facility-administered medications prior to visit.      Allergies:   Bee venom; Persantine [dipyridamole]; and Actos [pioglitazone]   Social History   Social History  . Marital status: Married    Spouse name: N/A  . Number of children: 3  . Years of education: N/A   Social History Main Topics  . Smoking status: Never Smoker  . Smokeless tobacco: Current User    Types: Snuff  . Alcohol use No  . Drug use: No  . Sexual activity: Not Asked   Other Topics Concern  . None   Social History Narrative  . None     Family History:  The patient'sfamily history includes Breast cancer in her maternal aunt; Diabetes in her father; Lung cancer in her brother; Prostate cancer in her brother; Stroke in her mother; Throat cancer in her maternal uncle.     ROS:   Please see the history of present illness.    ROS All other systems reviewed and are negative.   PHYSICAL EXAM:   VS:  BP 130/60   Pulse 95   Ht 5' 1"  (1.549 m)   Wt 136 lb 6.4 oz (61.9 kg)   LMP  (LMP Unknown)   BMI 25.77 kg/m    GEN: Well nourished, well developed, in no acute distress  HEENT: normal  Neck: no JVD, carotid bruits, or masses Cardiac: RRR; no murmurs, rubs, or gallops,no edema  Respiratory:  clear to auscultation bilaterally, normal work of breathing GI: soft, nontender, nondistended, + BS MS: no deformity or atrophy  Skin: warm and dry, no rash Neuro:  Alert and Oriented x 3, Strength and sensation are intact Psych: euthymic mood, full affect  Wt Readings from Last 3 Encounters:  12/20/15 136 lb 6.4 oz (61.9 kg)  12/19/15 140 lb (63.5 kg)  11/25/15 140 lb 14 oz (63.9 kg)      Studies/Labs Reviewed:   EKG:  EKG is ordered today.  The ekg ordered today demonstrates Normal sinus rhythm with sinus arrhythmia  heart rate 95  Recent Labs: 10/31/2015: TSH 0.77 11/17/2015: Magnesium 1.9 11/20/2015: ALT 17 11/21/2015: BUN 8; Creatinine, Ser 0.75; Hemoglobin 9.1; Platelets 142; Potassium 3.8; Sodium 142   Lipid Panel    Component Value Date/Time   CHOL 130 10/31/2015 1134   TRIG 109 10/31/2015 1134   HDL 52 10/31/2015 1134   CHOLHDL 2.5 10/31/2015 1134   VLDL 22 10/31/2015 1134   LDLCALC 56 10/31/2015 1134    Additional studies/ records that were reviewed today include:   11/15/15 Left Heart Cath and Coronary Angiography  Conclusion     LV end  diastolic pressure is normal.  There is moderate left ventricular systolic dysfunction.  Ramus lesion, 80 %stenosed.  Ost Ramus lesion, 70 %stenosed.  Ost LAD lesion, 80 %stenosed.  Prox LAD to Mid LAD lesion, 50 %stenosed.  Mid LAD lesion, 95 %stenosed.  Dist LAD lesion, 30 %stenosed.  Ost Cx lesion, 85 %stenosed.  Prox Cx lesion, 95 %stenosed.  Ost RCA lesion, 95 %stenosed.  Prox RCA lesion, 95 %stenosed.  Mid RCA lesion, 60 %stenosed.  Moderate LV dysfunction with an ejection fraction of 35-40% with a focal area of upper mid anterolateral hypocontractility and mid to basal inferior hypokinesis.  Severely calcified coronary arteries with severe multivessel CAD with 80% ostial LAD stenosis followed by diffuse narrowing of 50%. Prior to a 95% proximal to mid stenosis with 30% mid distal stenosis; 70% ostial 80% proximal ramus intermediate stenosis; 80% proximal followed by 95% left circumflex stenosis; an ostial 95% RCA stenosis followed by proximal 95% and mid 60% stenoses.  RECOMMENDATION: Surgical consultation for CABG revascularization surgery.     Intraoperative TEE 11/16/15 Conclusions Result status: Final result    Left ventricle: Normal cavity size and wall thickness. LV systolic function is severely reduced with an EF of 25-30%. Wall motion is abnormal. Inferior wall motion is hypokinetic. The septal wall  is severely hypokinetic.  Aortic valve: The valve is trileaflet. Mild valve thickening present. No stenosis. Mild regurgitation.  Mitral valve: No leaflet thickening and calcification present. Mild regurgitation.  Tricuspid valve: Mild to moderate regurgitation. The tricuspid valve regurgitation jet is directed toward septum.  Post Coronary Bypass grafting, the inferior wall contractility is improving. The septum remains severely hypokinetic. The overall EF has improved to approximately 40%.       ASSESSMENT & PLAN:   CAD s/p CABG x4: continue ASA, statin and BB.   HTN: BP 13/60 today. Continue current meds  Ischemic CM: Intraoperative TEE showed EF 25-30% which imrpved to 40% after bypass, severe septal hypokinesis and mild-mod TR. Continue BB and ACE. No s/s CHF.   HLD: LDL excellent at 56 on 10/31/15. Continue statin   TR: mild to mod. Follow   DMT2: sees PCP today to get a glucometer  Medication Adjustments/Labs and Tests Ordered: Current medicines are reviewed at length with the patient today.  Concerns regarding medicines are outlined above.  Medication changes, Labs and Tests ordered today are listed in the Patient Instructions below. Patient Instructions  Medication Instructions:  Your physician recommends that you continue on your current medications as directed. Please refer to the Current Medication list given to you today.   Labwork: None ordered  Testing/Procedures: None ordered  Follow-Up: Your physician wants you to follow-up in: 4 MONTHS WITH DR. Marlou Porch   You will receive a reminder letter in the mail two months in advance. If you don't receive a letter, please call our office to schedule the follow-up appointment.    Any Other Special Instructions Will Be Listed Below (If Applicable).     If you need a refill on your cardiac medications before your next appointment, please call your pharmacy.      Signed, Angelena Form, PA-C   12/20/2015 2:19 PM    [facility] [address], [place], [place]  [address] Phone: [phone]; Fax: [phone]

## 2015-12-19 ENCOUNTER — Ambulatory Visit
Admission: RE | Admit: 2015-12-19 | Discharge: 2015-12-19 | Disposition: A | Discharge status: Home / Self Care | Payer: Medicare Other | Source: Ambulatory Visit | Attending: Thoracic Surgery (Cardiothoracic Vascular Surgery) | Admitting: Thoracic Surgery (Cardiothoracic Vascular Surgery)

## 2015-12-19 ENCOUNTER — Ambulatory Visit (INDEPENDENT_AMBULATORY_CARE_PROVIDER_SITE_OTHER): Payer: Self-pay | Admitting: Physician Assistant

## 2015-12-19 VITALS — BP 113/69 | HR 92 | Resp 16 | Ht 61.0 in | Wt 140.0 lb

## 2015-12-19 DIAGNOSIS — Z951 Presence of aortocoronary bypass graft: Secondary | ICD-10-CM

## 2015-12-19 NOTE — Progress Notes (Signed)
HPI:  Patient returns for routine postoperative follow-up having undergone S/P OP CABG x 4 on 11/16/2015.  The patient's early postoperative recovery while in the hospital was notable for orthostatic hypotension which resolved prior to discharge, deconditioning with recs for SNF but patient declined.  Since hospital discharge the patient reports she is doing fine.  Her son accompanies her today.  She is ambulating in her home.  The son states it is not very much, but when the nurse comes they make her walk outside.  She has a poor appetite which is unchanged from prior to surgery.  Her son is going to try and get her some boost.  She does have pain in her right lower leg.  She denies fevers, chills, and drainage from her incision.  She says her blood sugars have been okay, however the son states they have not been checked as she does not have a glucometer.  He states they have just been giving her a reduced dose of her diabetic medications.  They do have an appointment with her PCP tomorrow in addition to Cardiology.  Finally in regards to her wounds, she has not done anything to them since surgery.    Current Outpatient Prescriptions  Medication Sig Dispense Refill  . aspirin EC 325 MG EC tablet Take 1 tablet (325 mg total) by mouth daily. 30 tablet 0  . atorvastatin (LIPITOR) 80 MG tablet TAKE 1 TABLET DAILY FOR CHOLESTEROL (Patient taking differently: TAKE 80 MG DAILY FOR CHOLESTEROL) 90 tablet 1  . blood glucose meter kit and supplies KIT Dispense based on patient and insurance preference. Check blood sugar 1 time daily-DX-E11.22. 1 each 0  . lisinopril (PRINIVIL,ZESTRIL) 2.5 MG tablet Take 1 tablet (2.5 mg total) by mouth daily. 30 tablet 3  . lubiprostone (AMITIZA) 24 MCG capsule Take 24 mcg by mouth daily.    . metFORMIN (GLUCOPHAGE-XR) 500 MG 24 hr tablet TAKE 2 TABLETS TWICE A DAY 360 tablet 1  . metoprolol (LOPRESSOR) 50 MG tablet Take 1 tablet (50 mg total) by mouth 2 (two) times daily. 60  tablet 3  . mirtazapine (REMERON) 15 MG tablet Take 1 tablet (15 mg total) by mouth at bedtime. 30 tablet 2  . polyethylene glycol (MIRALAX / GLYCOLAX) packet Take 17 g by mouth daily. 100 each 0  . nitroGLYCERIN (NITROSTAT) 0.4 MG SL tablet Dissolve 1 tablet under tongue every 3 to 5 minutes if needed for ChestPain (Patient not taking: Reported on 12/19/2015) 50 tablet 12  . traMADol (ULTRAM) 50 MG tablet Take 1-2 tablets (50-100 mg total) by mouth every 6 (six) hours as needed for moderate pain. (Patient not taking: Reported on 12/19/2015) 30 tablet 0   No current facility-administered medications for this visit.     Physical Exam:  BP 113/69 (BP Location: Left Arm, Patient Position: Sitting, Cuff Size: Large)   Pulse 92   Resp 16   Ht 5' 1"  (1.549 m)   Wt 140 lb (63.5 kg)   LMP  (LMP Unknown)   BMI 26.45 kg/m   Gen: no apparent distress Heart: RRR Lungs: CTA bilaterally Ext: no edema LLE, Right lower extremity is significantly improved since hospital discharge.  However, there is swelling and erythema present, with evidence of residual hematoma in the lower EVH tract.  There is no calf swelling or tenderness to palpation  Diagnostic Tests:  CXR: no pleural effusion, pneumothorax, sternal wires are intact  A/P:  1. S/P OP CABG x 4- hemodynamically stable, BP well  controlled... Continue Lopressor, Lisinopril at current dose 2. DM- no idea what sugars have been as patient doesn't have a glucometer.  She is also not taking medications as prescribed per son.  Instructed patient on importance of good glucose control and it is unsafe to be taking medications without knowing what sugars are.  They need to be 150 or less.... She is scheduled to follow up with PCP tomorrow.  They were instructed they need to get a glucometer ASAP and begin checking sugars 3. Right Lower extremity edema- her RLE is significantly improved since hospital discharge.  There was extensive ecchymosis present  during hospitalization.  This has resolved and there is not some residual hematoma in the RLE [facility] tract.  There is minimal erythema present.  I do not feel there is active infection present, however need to watch closely as this can develop if her blood sugars are not controlled 4. Ambulation- instructed patient on continuing ambulation at least 3x per day, and the risks of remaining sedentary 5. Dispo- F/U with Cardiology, PCP tomorrow, RTC prn  Ellwood Handler, PA-C [facility] [phone]

## 2015-12-19 NOTE — Progress Notes (Signed)
ALL OPERATIVE INCISIONS ARE VERY WELL HEALED. 

## 2015-12-20 ENCOUNTER — Encounter: Payer: Self-pay | Admitting: Physician Assistant

## 2015-12-20 ENCOUNTER — Encounter: Payer: Self-pay | Admitting: Internal Medicine

## 2015-12-20 ENCOUNTER — Ambulatory Visit (INDEPENDENT_AMBULATORY_CARE_PROVIDER_SITE_OTHER): Payer: Medicare Other | Admitting: Physician Assistant

## 2015-12-20 ENCOUNTER — Ambulatory Visit (INDEPENDENT_AMBULATORY_CARE_PROVIDER_SITE_OTHER): Payer: Medicare Other | Admitting: Internal Medicine

## 2015-12-20 VITALS — BP 130/64 | HR 88 | Temp 97.5°F | Resp 16 | Ht 61.0 in | Wt 137.8 lb

## 2015-12-20 VITALS — BP 130/60 | HR 95 | Ht 61.0 in | Wt 136.4 lb

## 2015-12-20 DIAGNOSIS — E1122 Type 2 diabetes mellitus with diabetic chronic kidney disease: Secondary | ICD-10-CM | POA: Diagnosis not present

## 2015-12-20 DIAGNOSIS — E785 Hyperlipidemia, unspecified: Secondary | ICD-10-CM

## 2015-12-20 DIAGNOSIS — Z951 Presence of aortocoronary bypass graft: Secondary | ICD-10-CM | POA: Diagnosis not present

## 2015-12-20 DIAGNOSIS — E559 Vitamin D deficiency, unspecified: Secondary | ICD-10-CM | POA: Diagnosis not present

## 2015-12-20 DIAGNOSIS — E782 Mixed hyperlipidemia: Secondary | ICD-10-CM

## 2015-12-20 DIAGNOSIS — N182 Chronic kidney disease, stage 2 (mild): Secondary | ICD-10-CM

## 2015-12-20 DIAGNOSIS — Z79899 Other long term (current) drug therapy: Secondary | ICD-10-CM

## 2015-12-20 DIAGNOSIS — I10 Essential (primary) hypertension: Secondary | ICD-10-CM

## 2015-12-20 DIAGNOSIS — E118 Type 2 diabetes mellitus with unspecified complications: Secondary | ICD-10-CM | POA: Diagnosis not present

## 2015-12-20 DIAGNOSIS — I25708 Atherosclerosis of coronary artery bypass graft(s), unspecified, with other forms of angina pectoris: Secondary | ICD-10-CM

## 2015-12-20 DIAGNOSIS — N189 Chronic kidney disease, unspecified: Secondary | ICD-10-CM | POA: Diagnosis not present

## 2015-12-20 NOTE — Patient Instructions (Addendum)
Medication Instructions:  Your physician recommends that you continue on your current medications as directed. Please refer to the Current Medication list given to you today.   Labwork: None ordered  Testing/Procedures: None ordered  Follow-Up: Your physician wants you to follow-up in: 4 MONTHS WITH DR. SKAINS   You will receive a reminder letter in the mail two months in advance. If you don't receive a letter, please call our office to schedule the follow-up appointment.    Any Other Special Instructions Will Be Listed Below (If Applicable).     If you need a refill on your cardiac medications before your next appointment, please call your pharmacy.   

## 2015-12-20 NOTE — Patient Instructions (Signed)

## 2015-12-20 NOTE — Progress Notes (Signed)
[name] [name], M.D.        Uvaldo Bristle. Silverio Lay, P.A.-C       Starlyn Skeans, P.A.-C  [facility]                [address] [place], [place] [id number] Telephone [phone] Telefax [phone] ______________________________________________________________________     This very nice 82 y.o. MWF presents for  follow up with after recent hospitalization . Patient had an apparent MI at home and reluctantly saw a cardiologist after which heart cath (Dr Claiborne Billings) found severe multivessel CAD and she subsequently underwent CABG x 4V on 12/19 (Dr Roxan Hockey) and she's done great since surgery.  She's also followed for Hypertension, Hyperlipidemia, T2_NIDDM and Vitamin D Deficiency.      Patient is treated for HTN (1991) & BP has been controlled at home. Today's BP is at goal - 130/64. Patient has had no complaints of any cardiac type chest pain, palpitations, dyspnea /orthopnea/PND, dizziness, claudication, or dependent edema. Patient's son is supervising her medications and also notes that her heart rates are running in th 90-100 range.      Hyperlipidemia is controlled with diet & meds. Patient denies myalgias or other med SE's. Last Lipids were  Lab Results  Component Value Date   CHOL 130 10/31/2015   HDL 52 10/31/2015   LDLCALC 56 10/31/2015   TRIG 109 10/31/2015   CHOLHDL 2.5 10/31/2015      Also, the patient has history of T2_NIDDM and has had no symptoms of reactive hypoglycemia, diabetic polys, paresthesias or visual blurring.  Last A1c was  Lab Results  Component Value Date   HGBA1C 9.1 (H) 10/31/2015      Further, the patient also has history of Vitamin D Deficiency and supplements vitamin D without any suspected side-effects. Last vitamin D was   Lab Results  Component Value Date   VD25OH 36 10/31/2015   Current Outpatient Prescriptions on File Prior to Visit  Medication Sig  .  aspirin EC 325 MG EC tablet Take 1 tab daily.  Marland Kitchen lisinopril 2.5 MG tablet Take 1 tab daily.  . mirtazapine  15 MG tablet Take 1 tab at bedtime.  Marland Kitchen NITROSTAT 0.4 MG SL tablet  if needed for ChestPain  . lubiprostone (AMITIZA) 24 MCG capsule Take 24 mcg  daily.  . metoprolol  50 MG tablet Take 1 tab 2  times daily.   Allergies  Allergen Reactions  . Bee Venom Anaphylaxis and Other (See Comments)    Eyes swell, cannot breathe  . Persantine [Dipyridamole] Other (See Comments)    "went crazy"  . Actos [Pioglitazone] Other (See Comments)    UNSPECIFIED REACTION    PMHx:   Past Medical History:  Diagnosis Date  . Anemia   . Anginal pain ([place])   . Cataract   . CKD stage 2 due to type 2 diabetes mellitus ([place])   . Colon polyp 2009    TUBULAR ADENOMA  . Coronary artery disease   . Depression   . Diabetes ([place])   . Diverticulosis of colon (without mention of hemorrhage) 2009  . Esophagitis 2009  . Gastritis 2009  . GERD (gastroesophageal reflux disease) 2009  . Hiatal hernia 2009  . Hyperlipemia   . Hypertension   . Thyroid disease    hypothyroid   Immunization History  Administered Date(s) Administered  .  Influenza Split 10/03/2014  . Influenza,inj,quad, With Preservative 10/31/2015  . Influenza-Unspecified 11/18/2012  . Pneumococcal Conjugate-13 11/04/2013  . Pneumococcal Polysaccharide-23 07/25/2010  . Pneumococcal-Unspecified 02/01/2001  . Td 02/01/2006   Past Surgical History:  Procedure Laterality Date  . BREAST BIOPSY Left 1999   neg  . CARDIAC CATHETERIZATION N/A 11/15/2015   Procedure: Left Heart Cath and Coronary Angiography;  Surgeon: Troy Sine, MD;  Location: [facility];  Service: Cardiovascular;  Laterality: N/A;  . COLONOSCOPY  05/2012   Dr. Sharlett Iles  . CORONARY ARTERY BYPASS GRAFT  11/16/2015   Procedure: OFF PUMP CORONARY ARTERY BYPASS GRAFTING (CABG) x FOUR, USING LEFT MAMMARY ARTERY  AND ENDOSCOPICALLY HARVESTED RIGHT GREATER SAPHENOUS VEIN.;   Surgeon: Melrose Nakayama, MD;  Location: [facility];  Service: Open Heart Surgery;;  . DILATION AND CURETTAGE OF UTERUS  1984  . TEE WITHOUT CARDIOVERSION N/A 11/16/2015   Procedure: TRANSESOPHAGEAL ECHOCARDIOGRAM (TEE);  Surgeon: Melrose Nakayama, MD;  Location: [facility];  Service: Open Heart Surgery;  Laterality: N/A;   FHx:    Reviewed / unchanged  SHx:    Reviewed / unchanged  Systems Review:  Constitutional: Denies fever, chills, wt changes, headaches, insomnia, fatigue, night sweats, change in appetite. Eyes: Denies redness, blurred vision, diplopia, discharge, itchy, watery eyes.  ENT: Denies discharge, congestion, post nasal drip, epistaxis, sore throat, earache, hearing loss, dental pain, tinnitus, vertigo, sinus pain, snoring.  CV: Denies chest pain, palpitations, irregular heartbeat, syncope, dyspnea, diaphoresis, orthopnea, PND, claudication or edema. Respiratory: denies cough, dyspnea, DOE, pleurisy, hoarseness, laryngitis, wheezing.  Gastrointestinal: Denies dysphagia, odynophagia, heartburn, reflux, water brash, abdominal pain or cramps, nausea, vomiting, bloating, diarrhea, constipation, hematemesis, melena, hematochezia  or hemorrhoids. Genitourinary: Denies dysuria, frequency, urgency, nocturia, hesitancy, discharge, hematuria or flank pain. Musculoskeletal: Denies arthralgias, myalgias, stiffness, jt. swelling, pain, limping or strain/sprain.  Skin: Denies pruritus, rash, hives, warts, acne, eczema or change in skin lesion(s). Neuro: No weakness, tremor, incoordination, spasms, paresthesia or pain. Psychiatric: Denies confusion, memory loss or sensory loss. Endo: Denies change in weight, skin or hair change.  Heme/Lymph: No excessive bleeding, bruising or enlarged lymph nodes.  Physical Exam BP 130/64   Pulse 88   Temp 97.5 F (36.4 C)   Resp 16   Ht 5\' 1"  (1.549 m)   Wt 137 lb 12.8 oz (62.5 kg)   LMP  (LMP Unknown)   BMI 26.04 kg/m   Appears well nourished and  in no distress.  Eyes: PERRLA, EOMs, conjunctiva no swelling or erythema. Sinuses: No frontal/maxillary tenderness ENT/Mouth: EAC's clear, TM's nl w/o erythema, bulging. Nares clear w/o erythema, swelling, exudates. Oropharynx clear without erythema or exudates. Oral hygiene is good. Tongue normal, non obstructing. Hearing intact.  Neck: Supple. Thyroid nl. Car 2+/2+ without bruits, nodes or JVD. Chest: Respirations nl with BS clear & equal w/o rales, rhonchi, wheezing or stridor. Healed Median sternotomy scar. Cor: Heart sounds normal w/ regular rate and rhythm without sig. murmurs, gallops, clicks, or rubs. Peripheral pulses normal and equal  without edema.  Abdomen: Soft & bowel sounds normal. Non-tender w/o guarding, rebound, hernias, masses, or organomegaly.  Lymphatics: Unremarkable.  Musculoskeletal: Full ROM all peripheral extremities, joint stability, 5/5 strength, and normal gait.  Skin: Warm, dry without exposed rashes, lesions or ecchymosis apparent.  Neuro: Cranial nerves intact, reflexes equal bilaterally. Sensory-motor testing grossly intact. Tendon reflexes grossly intact.  Pysch: Alert & oriented x 3.  Insight and judgement nl & appropriate. No ideations.  Assessment and Plan:  1. Essential hypertension  - Continue medication, monitor blood pressure at home.  - Continue DASH diet. Reminder to go to the ER if any CP,  SOB, nausea, dizziness, severe HA, changes vision/speech,  left arm numbness and tingling and jaw pain. - BASIC METABOLIC PANEL WITH GFR - TSH  2. Hyperlipidemia  - Continue diet/meds, exercise,& lifestyle modifications.  - Continue monitor periodic cholesterol/liver & renal functions - Hepatic function panel - TSH  3. CKD stage 2 due to type 2 diabetes mellitus ([place])  - Continue diet, exercise, lifestyle modifications.  - Monitor appropriate labs.  4. Coronary artery disease involving coronary bypass graft ([place])   5. Vitamin D deficiency  -  Continue supplementation.  6. Medication management  - BASIC METABOLIC PANEL WITH GFR - Hepatic function panel       Recommended regular exercise, BP monitoring, weight control, and discussed med and SE's. Recommended labs to assess and monitor clinical status. Further disposition pending results of labs. Over 30 minutes of exam, counseling, chart review was performed

## 2015-12-21 ENCOUNTER — Other Ambulatory Visit: Payer: Self-pay | Admitting: Internal Medicine

## 2015-12-21 DIAGNOSIS — E0590 Thyrotoxicosis, unspecified without thyrotoxic crisis or storm: Secondary | ICD-10-CM

## 2015-12-21 LAB — TSH: TSH: 0.18 mIU/L — ABNORMAL LOW

## 2015-12-21 LAB — HEPATIC FUNCTION PANEL
ALT: 9 U/L (ref 6–29)
AST: 13 U/L (ref 10–35)
Albumin: 3.9 g/dL (ref 3.6–5.1)
Alkaline Phosphatase: 63 U/L (ref 33–130)
Bilirubin, Direct: 0.1 mg/dL (ref <=0.2)
Indirect Bilirubin: 0.4 mg/dL (ref 0.2–1.2)
Total Bilirubin: 0.5 mg/dL (ref 0.2–1.2)
Total Protein: 7.7 g/dL (ref 6.1–8.1)

## 2015-12-21 LAB — BASIC METABOLIC PANEL WITH GFR
BUN: 7 mg/dL (ref 7–25)
CO2: 23 mmol/L (ref 20–31)
Calcium: 9.2 mg/dL (ref 8.6–10.4)
Chloride: 106 mmol/L (ref 98–110)
Creat: 0.59 mg/dL — ABNORMAL LOW (ref 0.60–0.88)
GFR, Est African American: >89 mL/min (ref >=60)
GFR, Est Non African American: 86 mL/min (ref >=60)
Glucose, Bld: 105 mg/dL — ABNORMAL HIGH (ref 65–99)
Potassium: 4.3 mmol/L (ref 3.5–5.3)
Sodium: 139 mmol/L (ref 135–146)

## 2015-12-21 MED ORDER — METOPROLOL TARTRATE 50 MG PO TABS: ORAL_TABLET | ORAL | 1 refills | Status: DC

## 2015-12-22 ENCOUNTER — Other Ambulatory Visit: Payer: Self-pay | Admitting: Internal Medicine

## 2015-12-22 DIAGNOSIS — E0590 Thyrotoxicosis, unspecified without thyrotoxic crisis or storm: Secondary | ICD-10-CM

## 2016-01-03 ENCOUNTER — Other Ambulatory Visit: Payer: Self-pay | Admitting: Internal Medicine

## 2016-01-05 ENCOUNTER — Other Ambulatory Visit: Payer: Self-pay | Admitting: Internal Medicine

## 2016-01-22 ENCOUNTER — Ambulatory Visit (INDEPENDENT_AMBULATORY_CARE_PROVIDER_SITE_OTHER): Payer: Medicare Other | Admitting: *Deleted

## 2016-01-22 DIAGNOSIS — I2510 Atherosclerotic heart disease of native coronary artery without angina pectoris: Secondary | ICD-10-CM | POA: Diagnosis not present

## 2016-01-22 DIAGNOSIS — E0590 Thyrotoxicosis, unspecified without thyrotoxic crisis or storm: Secondary | ICD-10-CM | POA: Diagnosis not present

## 2016-01-22 LAB — TSH: TSH: 0.63 mIU/L

## 2016-01-22 NOTE — Progress Notes (Signed)
Patient here for a NV and recheck her TSH, per Dr Melford Aase.  The patient is not currently on thyroid medication.

## 2016-02-27 ENCOUNTER — Encounter (HOSPITAL_COMMUNITY): Payer: Self-pay | Admitting: Internal Medicine

## 2016-02-27 NOTE — Progress Notes (Signed)
Mailed patient letter with information about Cardiac Rehab program. MW °

## 2016-02-29 ENCOUNTER — Other Ambulatory Visit: Payer: Self-pay | Admitting: Internal Medicine

## 2016-03-21 ENCOUNTER — Ambulatory Visit (INDEPENDENT_AMBULATORY_CARE_PROVIDER_SITE_OTHER): Payer: Medicare Other | Admitting: Internal Medicine

## 2016-03-21 ENCOUNTER — Encounter: Payer: Self-pay | Admitting: Internal Medicine

## 2016-03-21 VITALS — BP 124/60 | HR 78 | Temp 98.2°F | Resp 16 | Ht 61.0 in | Wt 128.0 lb

## 2016-03-21 DIAGNOSIS — I10 Essential (primary) hypertension: Secondary | ICD-10-CM | POA: Diagnosis not present

## 2016-03-21 DIAGNOSIS — E1159 Type 2 diabetes mellitus with other circulatory complications: Secondary | ICD-10-CM

## 2016-03-21 DIAGNOSIS — R413 Other amnesia: Secondary | ICD-10-CM | POA: Diagnosis not present

## 2016-03-21 DIAGNOSIS — Z79899 Other long term (current) drug therapy: Secondary | ICD-10-CM | POA: Diagnosis not present

## 2016-03-21 DIAGNOSIS — Z9114 Patient's other noncompliance with medication regimen: Secondary | ICD-10-CM

## 2016-03-21 DIAGNOSIS — E039 Hypothyroidism, unspecified: Secondary | ICD-10-CM | POA: Diagnosis not present

## 2016-03-21 DIAGNOSIS — E782 Mixed hyperlipidemia: Secondary | ICD-10-CM | POA: Diagnosis not present

## 2016-03-21 DIAGNOSIS — R627 Adult failure to thrive: Secondary | ICD-10-CM

## 2016-03-21 LAB — CBC WITH DIFFERENTIAL/PLATELET
Basophils Absolute: 0 cells/uL (ref 0–200)
Basophils Relative: 0 %
Eosinophils Absolute: 0 cells/uL — ABNORMAL LOW (ref 15–500)
Eosinophils Relative: 0 %
HCT: 35.2 % (ref 35.0–45.0)
Hemoglobin: 11.9 g/dL (ref 11.7–15.5)
Lymphocytes Relative: 50 %
Lymphs Abs: 2750 cells/uL (ref 850–3900)
MCH: 29.2 pg (ref 27.0–33.0)
MCHC: 33.8 g/dL (ref 32.0–36.0)
MCV: 86.3 fL (ref 80.0–100.0)
MPV: 9.5 fL (ref 7.5–12.5)
Monocytes Absolute: 715 cells/uL (ref 200–950)
Monocytes Relative: 13 %
Neutro Abs: 2035 cells/uL (ref 1500–7800)
Neutrophils Relative %: 37 %
Platelets: 218 10*3/uL (ref 140–400)
RBC: 4.08 MIL/uL (ref 3.80–5.10)
RDW: 14.9 % (ref 11.0–15.0)
WBC: 5.5 10*3/uL (ref 3.8–10.8)

## 2016-03-21 LAB — TSH: TSH: 0.41 mIU/L

## 2016-03-21 NOTE — Progress Notes (Signed)
Assessment and Plan:  Hypertension:  -well controlled here today -Continue medication -monitor blood pressure at home. -Continue DASH diet -Reminder to go to the ER if any CP, SOB, nausea, dizziness, severe HA, changes vision/speech, left arm numbness and tingling and jaw pain.  Cholesterol -statin therapy currently being utilized -LDL goal < 70 - Continue diet and exercise -Check cholesterol.   Diabetes with diabetic chronic kidney disease, PAD, CAD with 4 vessel bypass -cont current medications -non-compliance with diet -needs to get set up with meals on wheels.   -needs retinal examination and is way overdue.  Son was reminded -Continue diet and exercise.  -Check A1C  Vitamin D Def -continue medications.   Memory Disturbance -B12 -Thyroid -Iron and TIBC -CT head -referral to neuro -vascular dementia vs. Possible pseudodementia secondary to severe depression -cont remeron -will have home health perform an evaluation to determine what we can do to maximize home care -feel that they would likely be better served moving to assisted living as her husband is blind and can't hear well.  They both likely need higher acuity of care  CAD -recovered well from 4 vessel bypass -is due for a visit with cardiology next month although nothing is schedule -recommended son call and get this appointment set up -no angina  Chronic idiopathic constipation -amitiza samples given and prescription was called in  Continue diet and meds as discussed. Further disposition pending results of labs. Discussed med's effects and SE's.    HPI 83 y.o. female  presents for 3 month follow up with hypertension, hyperlipidemia, diabetes and vitamin D deficiency.   Her blood pressure has been controlled at home, today their BP is BP: 124/60.She does not workout. She denies chest pain, shortness of breath, dizziness.   She is on cholesterol medication and denies myalgias. Her cholesterol is at goal.  The cholesterol was:  03/21/2016: Cholesterol 96; HDL 38; LDL Cholesterol 40; Triglycerides 89    She has been working on diet and exercise for diabetes with other circulatory complications, she is on bASA, she is on ACE/ARB, and denies  foot ulcerations, hyperglycemia, hypoglycemia , increased appetite, nausea, polydipsia, polyuria, visual disturbances, vomiting and weight loss. Last A1C was: 03/21/2016: Hgb A1c MFr Bld 6.6.  She is still not eating well per her son.  She has poor appetites on most days.  Sometimes dinners consist of half of a candy bar.     Patient is on Vitamin D supplement. 10/31/2015: Vit D, 25-Hydroxy 81  Patient's son reports that he is concerned that she is losing her memory.  She is getting confused on medications.  She does have a pill box.  He is concerned that she took too much of her medications.  Othertimes she does not take all of it.  He reports that he is going over there often.  He reports that he comes down twice weekly.  He cannot afford to go over there more often.    She has been having problems with constipation.  She did get relief with amitiza 24 mcg samples.  She would like a prescription for this.    Current Medications:  Current Outpatient Prescriptions on File Prior to Visit  Medication Sig Dispense Refill  . aspirin EC 325 MG EC tablet Take 1 tablet (325 mg total) by mouth daily. 30 tablet 0  . atorvastatin (LIPITOR) 80 MG tablet TAKE 1 TABLET DAILY FOR CHOLESTEROL 90 tablet 1  . blood glucose meter kit and supplies KIT Dispense based on patient  and insurance preference. Check blood sugar 1 time daily-DX-E11.22. 1 each 0  . lisinopril (PRINIVIL,ZESTRIL) 2.5 MG tablet Take 1 tablet (2.5 mg total) by mouth daily. 30 tablet 3  . lubiprostone (AMITIZA) 24 MCG capsule Take 24 mcg by mouth daily.    . metFORMIN (GLUCOPHAGE-XR) 500 MG 24 hr tablet Take 500 mg by mouth 2 (two) times daily.     . metoprolol (LOPRESSOR) 50 MG tablet Take 1 tablet 2 x / day for  BP & heart rate 180 tablet 1  . mirtazapine (REMERON) 15 MG tablet TAKE 1 TABLET BY MOUTH EVERY EVENING AT BEDTIME 90 tablet 1  . nitroGLYCERIN (NITROSTAT) 0.4 MG SL tablet Dissolve 1 tablet under tongue every 3 to 5 minutes if needed for ChestPain 50 tablet 12  . verapamil (CALAN-SR) 240 MG CR tablet TAKE 1 TABLET AT BEDTIME 90 tablet 1   No current facility-administered medications on file prior to visit.    Medical History:  Past Medical History:  Diagnosis Date  . Anemia   . Anginal pain ([place])   . Cataract   . CKD stage 2 due to type 2 diabetes mellitus ([place])   . Colon polyp 2009    TUBULAR ADENOMA  . Coronary artery disease   . Depression   . Diabetes ([place])   . Diverticulosis of colon (without mention of hemorrhage) 2009  . Esophagitis 2009  . Gastritis 2009  . GERD (gastroesophageal reflux disease) 2009  . Hiatal hernia 2009  . Hyperlipemia   . Hypertension   . Thyroid disease    hypothyroid   Allergies:  Allergies  Allergen Reactions  . Bee Venom Anaphylaxis and Other (See Comments)    Eyes swell, cannot breathe  . Persantine [Dipyridamole] Other (See Comments)    "went crazy"  . Actos [Pioglitazone] Other (See Comments)    UNSPECIFIED REACTION      Review of Systems:  Review of Systems  Constitutional: Positive for weight loss. Negative for chills, fever and malaise/fatigue.  HENT: Negative for congestion, ear pain and sore throat.   Eyes: Negative.   Respiratory: Negative for cough, shortness of breath and wheezing.   Cardiovascular: Negative for chest pain, palpitations and leg swelling.  Gastrointestinal: Positive for constipation. Negative for abdominal pain, blood in stool, diarrhea, heartburn and melena.  Genitourinary: Negative.   Musculoskeletal: Negative for falls.  Skin: Negative.  Negative for itching and rash.  Neurological: Positive for weakness. Negative for dizziness, sensory change, loss of consciousness and headaches.   Psychiatric/Behavioral: Positive for depression and memory loss. Negative for hallucinations, substance abuse and suicidal ideas. The patient is not nervous/anxious and does not have insomnia.     Family history- Review and unchanged  Social history- Review and unchanged  Physical Exam: BP 124/60   Pulse 78   Temp 98.2 F (36.8 C) (Temporal)   Resp 16   Ht _0  (1.549 m)   Wt 128 lb (58.1 kg)   LMP  (LMP Unknown)   BMI 24.19 kg/m  Wt Readings from Last 3 Encounters:  03/21/16 128 lb (58.1 kg)  12/20/15 137 lb 12.8 oz (62.5 kg)  12/20/15 136 lb 6.4 oz (61.9 kg)   General Appearance: Poorly dressed and disheveled appearing WF who appears older than stated age Eyes: PERRLA, EOMs, conjunctiva no swelling or erythema ENT/Mouth: Ear canals clear with no erythema, swelling, or discharge.  TMs normal bilaterally, oropharynx clear, moist, with no exudate.  Facial hirsutism present Neck: Supple, thyroid normal, no JVD, no  cervical adenopathy.  Respiratory: Respiratory effort normal, breath sounds clear A&P, no wheeze, rhonchi or rales noted.  No retractions, no accessory muscle usage.  Well healed midline surgical scar to the anterior chest without tenderness to palpation. Cardio: RRR with no MRGs. No noted edema.  Abdomen: Soft, + BS.  Non tender, no guarding, rebound, hernias, masses. Musculoskeletal: Full ROM, 5/5 strength, Shuffling gait Skin: Warm, dry without rashes, lesions, ecchymosis.  Neuro: Awake and oriented X 3, Cranial nerves intact. No cerebellar symptoms.  Psych: flat affect, minimally communicative, Insight and Judgment appropriate.    Starlyn Skeans, PA-C 5:12 PM [facility]

## 2016-03-22 LAB — BASIC METABOLIC PANEL WITH GFR
BUN: 9 mg/dL (ref 7–25)
CO2: 21 mmol/L (ref 20–31)
Calcium: 9 mg/dL (ref 8.6–10.4)
Chloride: 105 mmol/L (ref 98–110)
Creat: 0.77 mg/dL (ref 0.60–0.88)
GFR, Est African American: 83 mL/min (ref >=60)
GFR, Est Non African American: 72 mL/min (ref >=60)
Glucose, Bld: 128 mg/dL — ABNORMAL HIGH (ref 65–99)
Potassium: 4.4 mmol/L (ref 3.5–5.3)
Sodium: 137 mmol/L (ref 135–146)

## 2016-03-22 LAB — LIPID PANEL
Cholesterol: 96 mg/dL (ref <200)
HDL: 38 mg/dL — ABNORMAL LOW (ref >50)
LDL Cholesterol: 40 mg/dL (ref <100)
Total CHOL/HDL Ratio: 2.5 Ratio (ref <5.0)
Triglycerides: 89 mg/dL (ref <150)
VLDL: 18 mg/dL (ref <30)

## 2016-03-22 LAB — HEMOGLOBIN A1C
Hgb A1c MFr Bld: 6.6 % — ABNORMAL HIGH (ref <5.7)
Mean Plasma Glucose: 143 mg/dL

## 2016-03-22 LAB — IRON AND TIBC
%SAT: 25 % (ref 11–50)
Iron: 69 ug/dL (ref 45–160)
TIBC: 273 ug/dL (ref 250–450)
UIBC: 204 ug/dL (ref 125–400)

## 2016-03-22 LAB — VITAMIN B12: Vitamin B-12: 326 pg/mL (ref 200–1100)

## 2016-03-22 LAB — HEPATIC FUNCTION PANEL
ALT: 9 U/L (ref 6–29)
AST: 18 U/L (ref 10–35)
Albumin: 4 g/dL (ref 3.6–5.1)
Alkaline Phosphatase: 41 U/L (ref 33–130)
Bilirubin, Direct: 0.1 mg/dL (ref <=0.2)
Indirect Bilirubin: 0.5 mg/dL (ref 0.2–1.2)
Total Bilirubin: 0.6 mg/dL (ref 0.2–1.2)
Total Protein: 7.1 g/dL (ref 6.1–8.1)

## 2016-03-25 ENCOUNTER — Encounter: Payer: Self-pay | Admitting: Neurology

## 2016-03-27 ENCOUNTER — Other Ambulatory Visit: Payer: Self-pay | Admitting: *Deleted

## 2016-03-27 MED ORDER — MIRTAZAPINE 15 MG PO TABS: ORAL_TABLET | ORAL | 1 refills | Status: DC

## 2016-03-27 MED ORDER — ISOSORBIDE MONONITRATE ER 30 MG PO TB24: 30.0000 mg | ORAL_TABLET | Freq: Every day | ORAL | 1 refills | Status: DC

## 2016-04-03 ENCOUNTER — Other Ambulatory Visit: Payer: Self-pay | Admitting: *Deleted

## 2016-04-03 DIAGNOSIS — I10 Essential (primary) hypertension: Secondary | ICD-10-CM

## 2016-04-03 MED ORDER — LISINOPRIL 2.5 MG PO TABS: 2.5000 mg | ORAL_TABLET | Freq: Every day | ORAL | 1 refills | Status: DC

## 2016-04-03 NOTE — Telephone Encounter (Signed)
error 

## 2016-04-04 ENCOUNTER — Other Ambulatory Visit: Payer: Self-pay | Admitting: *Deleted

## 2016-04-04 MED ORDER — LISINOPRIL 2.5 MG PO TABS: 2.5000 mg | ORAL_TABLET | Freq: Every day | ORAL | 1 refills | Status: DC

## 2016-05-15 LAB — HM DIABETES EYE EXAM

## 2016-05-20 ENCOUNTER — Other Ambulatory Visit: Payer: Self-pay | Admitting: Physician Assistant

## 2016-05-24 ENCOUNTER — Ambulatory Visit: Payer: Medicare Other | Admitting: Neurology

## 2016-06-19 ENCOUNTER — Encounter: Payer: Self-pay | Admitting: Internal Medicine

## 2016-06-26 ENCOUNTER — Other Ambulatory Visit: Payer: Self-pay | Admitting: *Deleted

## 2016-06-26 ENCOUNTER — Ambulatory Visit (INDEPENDENT_AMBULATORY_CARE_PROVIDER_SITE_OTHER): Payer: Medicare Other | Admitting: Internal Medicine

## 2016-06-26 ENCOUNTER — Encounter: Payer: Self-pay | Admitting: Internal Medicine

## 2016-06-26 VITALS — BP 124/86 | HR 93 | Temp 96.6°F | Resp 16 | Ht 61.0 in | Wt 120.6 lb

## 2016-06-26 DIAGNOSIS — E782 Mixed hyperlipidemia: Secondary | ICD-10-CM | POA: Diagnosis not present

## 2016-06-26 DIAGNOSIS — I10 Essential (primary) hypertension: Secondary | ICD-10-CM

## 2016-06-26 DIAGNOSIS — Z79899 Other long term (current) drug therapy: Secondary | ICD-10-CM | POA: Diagnosis not present

## 2016-06-26 DIAGNOSIS — E559 Vitamin D deficiency, unspecified: Secondary | ICD-10-CM

## 2016-06-26 DIAGNOSIS — N182 Chronic kidney disease, stage 2 (mild): Secondary | ICD-10-CM

## 2016-06-26 DIAGNOSIS — E039 Hypothyroidism, unspecified: Secondary | ICD-10-CM

## 2016-06-26 DIAGNOSIS — E1122 Type 2 diabetes mellitus with diabetic chronic kidney disease: Secondary | ICD-10-CM

## 2016-06-26 DIAGNOSIS — I2510 Atherosclerotic heart disease of native coronary artery without angina pectoris: Secondary | ICD-10-CM

## 2016-06-26 LAB — CBC WITH DIFFERENTIAL/PLATELET
Basophils Absolute: 0 cells/uL (ref 0–200)
Basophils Relative: 0 %
Eosinophils Absolute: 0 cells/uL — ABNORMAL LOW (ref 15–500)
Eosinophils Relative: 0 %
HCT: 33 % — ABNORMAL LOW (ref 35.0–45.0)
Hemoglobin: 11.1 g/dL — ABNORMAL LOW (ref 11.7–15.5)
Lymphocytes Relative: 50 %
Lymphs Abs: 2550 cells/uL (ref 850–3900)
MCH: 30 pg (ref 27.0–33.0)
MCHC: 33.6 g/dL (ref 32.0–36.0)
MCV: 89.2 fL (ref 80.0–100.0)
MPV: 10 fL (ref 7.5–12.5)
Monocytes Absolute: 816 cells/uL (ref 200–950)
Monocytes Relative: 16 %
Neutro Abs: 1734 cells/uL (ref 1500–7800)
Neutrophils Relative %: 34 %
Platelets: 244 10*3/uL (ref 140–400)
RBC: 3.7 MIL/uL — ABNORMAL LOW (ref 3.80–5.10)
RDW: 13.9 % (ref 11.0–15.0)
WBC: 5.1 10*3/uL (ref 3.8–10.8)

## 2016-06-26 MED ORDER — VERAPAMIL HCL ER 240 MG PO TBCR: 240.0000 mg | EXTENDED_RELEASE_TABLET | Freq: Every day | ORAL | 1 refills | Status: DC

## 2016-06-26 NOTE — Patient Instructions (Signed)

## 2016-06-26 NOTE — Progress Notes (Signed)
This very nice 83 y.o. MWF brought by her son and presents for 6 month follow up with Hypertension, ASCAD, Hyperlipidemia, Pre-Diabetes and Vitamin D Deficiency. Per patient's son, she has had several falls w/o significant injury.      Patient is treated for HTN (1991) & BP has been controlled at home. Today's BP is at goal - 124/86. In Dec 2017, post MI (at home), she underwent 4V CABG. Patient has had no complaints of any cardiac type chest pain, palpitations, dyspnea/orthopnea/PND, dizziness, claudication or dependent edema.     Hyperlipidemia is controlled with diet & meds. Patient denies myalgias or other med SE's. Last Lipids were at goal: Lab Results  Component Value Date   CHOL 96 03/21/2016   HDL 38 (L) 03/21/2016   LDLCALC 40 03/21/2016   TRIG 89 03/21/2016   CHOLHDL 2.5 03/21/2016      Also, the patient has history of T2_NIDDM (1996) and has had no symptoms of reactive hypoglycemia, diabetic polys, paresthesias or visual blurring.  Last A1c was not at goal: Lab Results  Component Value Date   HGBA1C 6.6 (H) 03/21/2016      Further, the patient also has history of Vitamin D Deficiency ("27" in 2008) and supplements vitamin D without any suspected side-effects. Last vitamin D was still very low :  Lab Results  Component Value Date   VD25OH 36 10/31/2015   Current Outpatient Prescriptions on File Prior to Visit  Medication Sig  . aspirin EC 325 MG EC tablet Take 1 tablet (325 mg total) by mouth daily.  Marland Kitchen atorvastatin (LIPITOR) 80 MG tablet TAKE 1 TABLET DAILY FOR CHOLESTEROL  . blood glucose meter kit and supplies KIT Dispense based on patient and insurance preference. Check blood sugar 1 time daily-DX-E11.22.  . isosorbide mononitrate (IMDUR) 30 MG 24 hr tablet Take 1 tablet (30 mg total) by mouth daily.  Marland Kitchen lisinopril (PRINIVIL,ZESTRIL) 2.5 MG tablet Take 1 tablet (2.5 mg total) by mouth daily.  Marland Kitchen lubiprostone (AMITIZA) 24 MCG capsule Take 24 mcg by mouth daily.  .  metFORMIN (GLUCOPHAGE-XR) 500 MG 24 hr tablet TAKE 2 TABLETS TWICE A DAY  . metoprolol (LOPRESSOR) 50 MG tablet Take 1 tablet 2 x / day for BP & heart rate  . mirtazapine (REMERON) 15 MG tablet TAKE 1 TABLET BY MOUTH EVERY EVENING AT BEDTIME  . nitroGLYCERIN (NITROSTAT) 0.4 MG SL tablet Dissolve 1 tablet under tongue every 3 to 5 minutes if needed for ChestPain   No current facility-administered medications on file prior to visit.    Allergies  Allergen Reactions  . Bee Venom Anaphylaxis and Other (See Comments)    Eyes swell, cannot breathe  . Persantine [Dipyridamole] Other (See Comments)    "went crazy"  . Actos [Pioglitazone] Other (See Comments)    UNSPECIFIED REACTION    PMHx:   Past Medical History:  Diagnosis Date  . Anemia   . Anginal pain ([place])   . Cataract   . CKD stage 2 due to type 2 diabetes mellitus ([place])   . Colon polyp 2009    TUBULAR ADENOMA  . Coronary artery disease   . Depression   . Diabetes ([place])   . Diverticulosis of colon (without mention of hemorrhage) 2009  . Esophagitis 2009  . Gastritis 2009  . GERD (gastroesophageal reflux disease) 2009  . Hiatal hernia 2009  . Hyperlipemia   . Hypertension   . Thyroid disease    hypothyroid   Immunization History  Administered Date(s) Administered  . Influenza Split 10/03/2014  . Influenza,inj,quad, With Preservative 10/31/2015  . Influenza-Unspecified 11/18/2012  . Pneumococcal Conjugate-13 11/04/2013  . Pneumococcal Polysaccharide-23 07/25/2010  . Pneumococcal-Unspecified 02/01/2001  . Td 02/01/2006   Past Surgical History:  Procedure Laterality Date  . BREAST BIOPSY Left 1999   neg  . CARDIAC CATHETERIZATION N/A 11/15/2015   Procedure: Left Heart Cath and Coronary Angiography;  Surgeon: Troy Sine, MD;  Location: [facility];  Service: Cardiovascular;  Laterality: N/A;  . COLONOSCOPY  05/2012   Dr. Sharlett Iles  . CORONARY ARTERY BYPASS GRAFT  11/16/2015   Procedure: OFF PUMP CORONARY  ARTERY BYPASS GRAFTING (CABG) x FOUR, USING LEFT MAMMARY ARTERY  AND ENDOSCOPICALLY HARVESTED RIGHT GREATER SAPHENOUS VEIN.;  Surgeon: Melrose Nakayama, MD;  Location: [facility];  Service: Open Heart Surgery;;  . DILATION AND CURETTAGE OF UTERUS  1984  . TEE WITHOUT CARDIOVERSION N/A 11/16/2015   Procedure: TRANSESOPHAGEAL ECHOCARDIOGRAM (TEE);  Surgeon: Melrose Nakayama, MD;  Location: [facility];  Service: Open Heart Surgery;  Laterality: N/A;   FHx:    Reviewed / unchanged  SHx:    Reviewed / unchanged  Systems Review:  Constitutional: Denies fever, chills, wt changes, headaches, insomnia, fatigue, night sweats, change in appetite. Eyes: Denies redness, blurred vision, diplopia, discharge, itchy, watery eyes.  ENT: Denies discharge, congestion, post nasal drip, epistaxis, sore throat, earache, hearing loss, dental pain, tinnitus, vertigo, sinus pain, snoring.  CV: Denies chest pain, palpitations, irregular heartbeat, syncope, dyspnea, diaphoresis, orthopnea, PND, claudication or edema. Respiratory: denies cough, dyspnea, DOE, pleurisy, hoarseness, laryngitis, wheezing.  Gastrointestinal: Denies dysphagia, odynophagia, heartburn, reflux, water brash, abdominal pain or cramps, nausea, vomiting, bloating, diarrhea, constipation, hematemesis, melena, hematochezia  or hemorrhoids. Genitourinary: Denies dysuria, frequency, urgency, nocturia, hesitancy, discharge, hematuria or flank pain. Musculoskeletal: Denies arthralgias, myalgias, stiffness, jt. swelling, pain, limping or strain/sprain.  Skin: Denies pruritus, rash, hives, warts, acne, eczema or change in skin lesion(s). Neuro: No weakness, tremor, incoordination, spasms, paresthesia or pain. Psychiatric: Denies confusion, memory loss or sensory loss. Endo: Denies change in weight, skin or hair change.  Heme/Lymph: No excessive bleeding, bruising or enlarged lymph nodes.  Physical Exam  BP 124/86   Pulse 93   Temp (!) 96.6 F (35.9 C)    Resp 16   Ht 5' 1"  (1.549 m)   Wt 120 lb 9.6 oz (54.7 kg)   LMP  (LMP Unknown)   BMI 22.79 kg/m   Appears well nourished, well groomed  and in no distress.  Eyes: PERRLA, EOMs, conjunctiva no swelling or erythema. Sinuses: No frontal/maxillary tenderness ENT/Mouth: EAC's clear, TM's nl w/o erythema, bulging. Nares clear w/o erythema, swelling, exudates. Oropharynx clear without erythema or exudates. Oral hygiene is good. Tongue normal, non obstructing. Hearing intact.  Neck: Supple. Thyroid nl. Car 2+/2+ without bruits, nodes or JVD. Chest: Respirations nl with BS clear & equal w/o rales, rhonchi, wheezing or stridor.  Cor: Heart sounds normal w/ regular rate and rhythm without sig. murmurs, gallops, clicks or rubs. Peripheral pulses normal and equal  without edema.  Abdomen: Soft & bowel sounds normal. Non-tender w/o guarding, rebound, hernias, masses or organomegaly.  Lymphatics: Unremarkable.  Musculoskeletal: Full ROM all peripheral extremities, joint stability, 5/5 strength and normal gait.  Skin: Warm, dry without exposed rashes, lesions or ecchymosis apparent.  Neuro: Cranial nerves intact, reflexes equal bilaterally. Sensory-motor testing grossly intact. Tendon reflexes grossly intact.  Pysch: Alert & oriented x 3.  Insight and judgement nl &  appropriate. No ideations.  Assessment and Plan:  1. Essential hypertension  - Continue medication, monitor blood pressure at home.  - Continue DASH diet. Reminder to go to the ER if any CP,  SOB, nausea, dizziness, severe HA, changes vision/speech,  left arm numbness and tingling and jaw pain.  - CBC with Differential/Platelet - BASIC METABOLIC PANEL WITH GFR - Magnesium - TSH  2. Hyperlipidemia, mixed  - Continue diet/meds, exercise,& lifestyle modifications.  - Continue monitor periodic cholesterol/liver & renal functions  - Hepatic function panel - Lipid panel - TSH  3. CKD stage 2 due to type 2 diabetes mellitus  ([place])  - Continue diet, exercise, lifestyle modifications.  - Monitor appropriate labs.  - Hemoglobin A1c - Insulin, random  4. Vitamin D deficiency  - Continue supplementation.  - VITAMIN D 25 Hydroxy   5. Coronary artery disease involving native heart without angina pectoris, unspecified vessel or lesion type  - Lipid panel  6. Acquired hypothyroidism  - TSH  7. Medication management  - CBC with Differential/Platelet - BASIC METABOLIC PANEL WITH GFR - Hepatic function panel - Magnesium - Lipid panel - TSH - Hemoglobin A1c - Insulin, random - VITAMIN D 25 Hydroxy        Discussed  regular exercise, BP monitoring, weight control to achieve/maintain BMI less than 25 and discussed med and SE's. Recommended labs to assess and monitor clinical status with further disposition pending results of labs. Over 30 minutes of exam, counseling, chart review was performed.

## 2016-06-27 ENCOUNTER — Other Ambulatory Visit: Payer: Self-pay | Admitting: Internal Medicine

## 2016-06-27 DIAGNOSIS — E0590 Thyrotoxicosis, unspecified without thyrotoxic crisis or storm: Secondary | ICD-10-CM

## 2016-06-27 LAB — HEPATIC FUNCTION PANEL
ALT: 6 U/L (ref 6–29)
AST: 10 U/L (ref 10–35)
Albumin: 4.1 g/dL (ref 3.6–5.1)
Alkaline Phosphatase: 43 U/L (ref 33–130)
Bilirubin, Direct: 0.1 mg/dL (ref <=0.2)
Indirect Bilirubin: 0.3 mg/dL (ref 0.2–1.2)
Total Bilirubin: 0.4 mg/dL (ref 0.2–1.2)
Total Protein: 7.4 g/dL (ref 6.1–8.1)

## 2016-06-27 LAB — MAGNESIUM: Magnesium: 1.5 mg/dL (ref 1.5–2.5)

## 2016-06-27 LAB — INSULIN, RANDOM: Insulin: 14 u[IU]/mL (ref 2.0–19.6)

## 2016-06-27 LAB — LIPID PANEL
Cholesterol: 110 mg/dL (ref <200)
HDL: 37 mg/dL — ABNORMAL LOW (ref >50)
LDL Cholesterol: 57 mg/dL (ref <100)
Total CHOL/HDL Ratio: 3 Ratio (ref <5.0)
Triglycerides: 82 mg/dL (ref <150)
VLDL: 16 mg/dL (ref <30)

## 2016-06-27 LAB — HEMOGLOBIN A1C
Hgb A1c MFr Bld: 6 % — ABNORMAL HIGH (ref <5.7)
Mean Plasma Glucose: 126 mg/dL

## 2016-06-27 LAB — VITAMIN D 25 HYDROXY (VIT D DEFICIENCY, FRACTURES): Vit D, 25-Hydroxy: 43 ng/mL (ref 30–100)

## 2016-06-27 LAB — TSH: TSH: 0.27 mIU/L — ABNORMAL LOW

## 2016-07-24 ENCOUNTER — Ambulatory Visit (INDEPENDENT_AMBULATORY_CARE_PROVIDER_SITE_OTHER): Payer: Medicare Other | Admitting: Neurology

## 2016-07-24 ENCOUNTER — Encounter: Payer: Self-pay | Admitting: Neurology

## 2016-07-24 VITALS — BP 88/46 | HR 74 | Ht 61.5 in | Wt 117.0 lb

## 2016-07-24 DIAGNOSIS — F0390 Unspecified dementia without behavioral disturbance: Secondary | ICD-10-CM

## 2016-07-24 DIAGNOSIS — F03A0 Unspecified dementia, mild, without behavioral disturbance, psychotic disturbance, mood disturbance, and anxiety: Secondary | ICD-10-CM

## 2016-07-24 NOTE — Progress Notes (Signed)
NEUROLOGY CONSULTATION NOTE  [name] MRN: [id number] DOB: November 24, 1931  Referring provider: Starlyn Skeans, PA-C Primary care provider: Dr. [name]  Reason for consult:  Memory loss  Thank you for your kind referral of [name] [name] for consultation of the above symptoms. Although her history is well known to you, please allow me to reiterate it for the purpose of our medical record. The patient was accompanied to the clinic by her son who also provides collateral information. Records and images were personally reviewed where available.  HISTORY OF PRESENT ILLNESS: This is an 83 year old right-handed woman with a history of hypertension, hyperlipidemia, diabetes, CAD s/p CABG, presenting for evaluation of worsening memory. She feels her memory is not as good as it used to be. Sometimes she forgets. Her son started noticing changes around 3 years ago. Initially she would misplace things or forget what he told her or what she was doing. She got lost coming home from Dr. Idell Pickles office last year, gone for 4 hours, and has not driven since. She does not remember this episode. Her son started helping with bill payments 6-8 months ago after her husband asked him to help because she would miss payments. He fills out her pillbox, most of the time she remembers to take her medication, missing maybe 1 day a week of her night medications. He visits 2-3 times a week and calls daily to remind her to take the medication. Most bothersome for her son is not recognizing some of her grandchildren and forgetting his middle name 8 months ago. 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 She does laundry and is independent with dressing and bathing. No personality changes, paranoia, or hallucinations. Her son feels she is a little on the depressed side, she reports her brother and daughter passed away.  She has had a couple of falls, last month she fell going up steps.  She fell in the living room prior to that with no clear reason, no loss of consciousness. She occasionally gets dizzy. She has constipation. Otherwise she denies any headaches, diplopia, dysarthria/dysphagia, neck/back pain, focal numbness/tingling/weakness, bladder dysfunction, anosmia, or tremors. No family history of dementia. No history of significant head injuries. No alcohol use.  Laboratory Data: Lab Results  Component Value Date   WBC 5.1 06/26/2016   HGB 11.1 (L) 06/26/2016   HCT 33.0 (L) 06/26/2016   MCV 89.2 06/26/2016   PLT 244 06/26/2016     Chemistry      Component Value Date/Time   NA 137 03/21/2016 1630   K 4.4 03/21/2016 1630   CL 105 03/21/2016 1630   CO2 21 03/21/2016 1630   BUN 9 03/21/2016 1630   CREATININE 0.77 03/21/2016 1630      Component Value Date/Time   CALCIUM 9.0 03/21/2016 1630   ALKPHOS 43 06/26/2016 1757   AST 10 06/26/2016 1757   ALT 6 06/26/2016 1757   BILITOT 0.4 06/26/2016 1757     Lab Results  Component Value Date   TSH 0.27 (L) 06/26/2016   Lab Results  Component Value Date   VITAMINB12 326 03/21/2016     PAST MEDICAL HISTORY: Past Medical History:  Diagnosis Date  . Anemia   . Anginal pain ([place])   . Cataract   . Colon polyp 2009    TUBULAR ADENOMA  . Coronary artery disease   . Depression   . Diabetes ([place])   . Diverticulosis of colon (  without mention of hemorrhage) 2009  . Esophagitis 2009  . Gastritis 2009  . GERD (gastroesophageal reflux disease) 2009  . Hiatal hernia 2009  . Hyperlipemia   . Hypertension   . Thyroid disease    hypothyroid    PAST SURGICAL HISTORY: Past Surgical History:  Procedure Laterality Date  . BREAST BIOPSY Left 1999   neg  . CARDIAC CATHETERIZATION N/A 11/15/2015   Procedure: Left Heart Cath and Coronary Angiography;  Surgeon: Troy Sine, MD;  Location: [facility];  Service: Cardiovascular;  Laterality: N/A;  . COLONOSCOPY  05/2012   Dr. Sharlett Iles  . CORONARY ARTERY  BYPASS GRAFT  11/16/2015   Procedure: OFF PUMP CORONARY ARTERY BYPASS GRAFTING (CABG) x FOUR, USING LEFT MAMMARY ARTERY  AND ENDOSCOPICALLY HARVESTED RIGHT GREATER SAPHENOUS VEIN.;  Surgeon: Melrose Nakayama, MD;  Location: [facility];  Service: Open Heart Surgery;;  . DILATION AND CURETTAGE OF UTERUS  1984  . TEE WITHOUT CARDIOVERSION N/A 11/16/2015   Procedure: TRANSESOPHAGEAL ECHOCARDIOGRAM (TEE);  Surgeon: Melrose Nakayama, MD;  Location: [facility];  Service: Open Heart Surgery;  Laterality: N/A;    MEDICATIONS: Current Outpatient Prescriptions on File Prior to Visit  Medication Sig Dispense Refill  . aspirin EC 325 MG EC tablet Take 1 tablet (325 mg total) by mouth daily. 30 tablet 0  . atorvastatin (LIPITOR) 80 MG tablet TAKE 1 TABLET DAILY FOR CHOLESTEROL 90 tablet 1  . blood glucose meter kit and supplies KIT Dispense based on patient and insurance preference. Check blood sugar 1 time daily-DX-E11.22. 1 each 0  . isosorbide mononitrate (IMDUR) 30 MG 24 hr tablet Take 1 tablet (30 mg total) by mouth daily. 90 tablet 1  . lisinopril (PRINIVIL,ZESTRIL) 2.5 MG tablet Take 1 tablet (2.5 mg total) by mouth daily. 90 tablet 1  . metFORMIN (GLUCOPHAGE-XR) 500 MG 24 hr tablet TAKE 2 TABLETS TWICE A DAY 360 tablet 1  . metoprolol (LOPRESSOR) 50 MG tablet Take 1 tablet 2 x / day for BP & heart rate 180 tablet 1  . mirtazapine (REMERON) 15 MG tablet TAKE 1 TABLET BY MOUTH EVERY EVENING AT BEDTIME 90 tablet 1  . nitroGLYCERIN (NITROSTAT) 0.4 MG SL tablet Dissolve 1 tablet under tongue every 3 to 5 minutes if needed for ChestPain 50 tablet 12  . verapamil (CALAN-SR) 240 MG CR tablet Take 1 tablet (240 mg total) by mouth at bedtime. 90 tablet 1   No current facility-administered medications on file prior to visit.     ALLERGIES: Allergies  Allergen Reactions  . Bee Venom Anaphylaxis and Other (See Comments)    Eyes swell, cannot breathe  . Persantine [Dipyridamole] Other (See Comments)    "went  crazy"  . Actos [Pioglitazone] Other (See Comments)    UNSPECIFIED REACTION     FAMILY HISTORY: Family History  Problem Relation Age of Onset  . Diabetes Father   . Stroke Mother   . Breast cancer Maternal Aunt   . Throat cancer Maternal Uncle   . Lung cancer Brother   . Prostate cancer Brother   . Breast cancer Maternal Grandmother   . Rectal cancer Neg Hx   . Stomach cancer Neg Hx   . Colon cancer Neg Hx   . Esophageal cancer Neg Hx     SOCIAL HISTORY: Social History   Social History  . Marital status: Married    Spouse name: N/A  . Number of children: 3  . Years of education: N/A   Occupational  History  . Not on file.   Social History Main Topics  . Smoking status: Never Smoker  . Smokeless tobacco: Current User    Types: Snuff  . Alcohol use No  . Drug use: No  . Sexual activity: Not on file   Other Topics Concern  . Not on file   Social History Narrative  . No narrative on file    REVIEW OF SYSTEMS: Constitutional: No fevers, chills, or sweats, no generalized fatigue, change in appetite Eyes: No visual changes, double vision, eye pain Ear, nose and throat: No hearing loss, ear pain, nasal congestion, sore throat Cardiovascular: No chest pain, palpitations Respiratory:  No shortness of breath at rest or with exertion, wheezes GastrointestinaI: No nausea, vomiting, diarrhea, abdominal pain, fecal incontinence Genitourinary:  No dysuria, urinary retention or frequency Musculoskeletal:  No neck pain, back pain Integumentary: No rash, pruritus, skin lesions Neurological: as above Psychiatric: No depression, insomnia, anxiety Endocrine: No palpitations, fatigue, diaphoresis, mood swings, change in appetite, change in weight, increased thirst Hematologic/Lymphatic:  No anemia, purpura, petechiae. Allergic/Immunologic: no itchy/runny eyes, nasal congestion, recent allergic reactions, rashes  PHYSICAL EXAM: Vitals:   07/24/16 1359  BP: (!) 88/46  Pulse:  74   General: No acute distress, slightly disheveled appearance Head:  Normocephalic/atraumatic Eyes: Fundoscopic exam shows bilateral sharp discs, no vessel changes, exudates, or hemorrhages Neck: supple, no paraspinal tenderness, full range of motion Back: No paraspinal tenderness Heart: regular rate and rhythm Lungs: Clear to auscultation bilaterally. Vascular: No carotid bruits. Skin/Extremities: No rash, no edema Neurological Exam: Mental status: alert and oriented to person, place, and year and day of week. States it is March, spring (it is July, summer). No dysarthria or aphasia, Fund of knowledge is appropriate.  Recent and remote memory are impaired.  Attention and concentration are normal.    Able to name objects and repeat phrases. CDT 5/5  MMSE - Mini Mental State Exam 07/24/2016  Orientation to time 2  Orientation to Place 5  Registration 3  Attention/ Calculation 5  Recall 0  Language- name 2 objects 2  Language- repeat 1  Language- follow 3 step command 3  Language- read & follow direction 1  Write a sentence 1  Copy design 1  Total score 24   Cranial nerves: CN I: not tested CN II: pupils equal, round and reactive to light, visual fields intact, fundi unremarkable. CN III, IV, VI:  full range of motion, no nystagmus, no ptosis CN V: facial sensation intact CN VII: upper and lower face symmetric CN VIII: hearing intact to finger rub CN IX, X: gag intact, uvula midline CN XI: sternocleidomastoid and trapezius muscles intact CN XII: tongue midline Bulk & Tone: normal, mild cogwheeling bilaterally with distraction, no fasciculations. Motor: 5/5 throughout with no pronator drift. Sensation: intact to light touch, cold, pin, vibration and joint position sense.  No extinction to double simultaneous stimulation.  Romberg test negative Deep Tendon Reflexes: +1 throughout, no ankle clonus Plantar responses: downgoing bilaterally Cerebellar: no incoordination on finger  to nose, heel to shin. No dysdiadochokinesia Gait: narrow-based and steady, unable to tandem walk Tremor: none  IMPRESSION: This is a 83 year old right-handed woman with a history of hypertension, hyperlipidemia, diabetes, CAD s/p CABG, presenting for evaluation of worsening memory. Neurological exam is non-focal, MMSE today 24/30. History suggestive of mild dementia, possibly vascular etiology. We discussed doing an MRI brain without contrast to assess for underlying structural abnormality and assess vascular load. We discussed getting home  health to help with medication management. Her son reports insurance issues, and would like to hold off on testing and home health until these are resolved. We discussed the option of starting cholinesterase inhibitors such as Aricept, she feels she is on too much medications and would like to hold off. We discussed the importance of control of vascular risk factors, physical exercise, and brain stimulation exercises for brain health. She is not driving. She will follow-up for re-evaluation in 6 months and knows to call for any changes.  Thank you for allowing me to participate in the care of this patient. Please do not hesitate to call for any questions or concerns.   Ellouise Newer, M.D.  CC: Dr. Melford Aase

## 2016-07-24 NOTE — Patient Instructions (Addendum)
1. Schedule MRI brain without contrast once able 2. Continue control of blood pressure, cholesterol, diabetes, as well as physical exercise and brain stimulation exercises are important for brain health 3. Follow-up in 6 months, call for any changes

## 2016-07-26 DIAGNOSIS — F03B0 Unspecified dementia, moderate, without behavioral disturbance, psychotic disturbance, mood disturbance, and anxiety: Secondary | ICD-10-CM | POA: Insufficient documentation

## 2016-07-26 DIAGNOSIS — F0390 Unspecified dementia without behavioral disturbance: Secondary | ICD-10-CM | POA: Insufficient documentation

## 2016-07-30 ENCOUNTER — Ambulatory Visit: Payer: Self-pay

## 2016-07-30 DIAGNOSIS — E0590 Thyrotoxicosis, unspecified without thyrotoxic crisis or storm: Secondary | ICD-10-CM | POA: Diagnosis not present

## 2016-07-30 LAB — TSH: TSH: 0.61 mIU/L

## 2016-07-30 NOTE — Progress Notes (Signed)
Pt presents for TSH alb visit. Pt is not taking any thyroid medicine at this time.

## 2016-08-06 ENCOUNTER — Other Ambulatory Visit: Payer: Self-pay | Admitting: *Deleted

## 2016-08-06 MED ORDER — ATORVASTATIN CALCIUM 80 MG PO TABS: 80.0000 mg | ORAL_TABLET | Freq: Every day | ORAL | 1 refills | Status: DC

## 2016-08-06 MED ORDER — LISINOPRIL 2.5 MG PO TABS
2.5000 mg | ORAL_TABLET | Freq: Every day | ORAL | 1 refills | Status: DC
Start: 2016-08-06 — End: 2016-10-25

## 2016-08-06 MED ORDER — MIRTAZAPINE 15 MG PO TABS: ORAL_TABLET | ORAL | 1 refills | Status: DC

## 2016-08-06 MED ORDER — ISOSORBIDE MONONITRATE ER 30 MG PO TB24
30.0000 mg | ORAL_TABLET | Freq: Every day | ORAL | 1 refills | Status: DC
Start: 2016-08-06 — End: 2016-09-09

## 2016-08-06 MED ORDER — METFORMIN HCL ER 500 MG PO TB24: 1000.0000 mg | ORAL_TABLET | Freq: Two times a day (BID) | ORAL | 1 refills | Status: DC

## 2016-08-06 MED ORDER — VERAPAMIL HCL ER 240 MG PO TBCR: 240.0000 mg | EXTENDED_RELEASE_TABLET | Freq: Every day | ORAL | 1 refills | Status: DC

## 2016-08-27 ENCOUNTER — Other Ambulatory Visit: Payer: Self-pay | Admitting: Internal Medicine

## 2016-09-06 ENCOUNTER — Other Ambulatory Visit: Payer: Self-pay | Admitting: Internal Medicine

## 2016-09-09 ENCOUNTER — Other Ambulatory Visit: Payer: Self-pay | Admitting: Internal Medicine

## 2016-09-28 ENCOUNTER — Other Ambulatory Visit: Payer: Self-pay | Admitting: Internal Medicine

## 2016-09-28 DIAGNOSIS — I10 Essential (primary) hypertension: Secondary | ICD-10-CM

## 2016-10-02 NOTE — Progress Notes (Signed)
Patient ID: [name] [name], female   DOB: [birth year], 83 y.o.   MRN: [id number]  Assessment and Plan:  Hypertension:  -monitor blood pressure at home.  Stay off medications but lisinopril -Continue DASH diet.   -Reminder to go to the ER if any CP, SOB, nausea, dizziness, severe HA, changes vision/speech, left arm numbness and tingling, and jaw pain.  Cholesterol: -Continue diet and exercise.  -Check cholesterol.   Pre-diabetes: -Continue diet and exercise.  -Check A1C  Vitamin D Def: -check level -continue medications.   Constipation -amitiza samples given -recommend increasing water -recommend increasing fiber - try lactulose  Continue diet and meds as discussed. Further disposition pending results of labs.  HPI 83 y.o. female  presents for 3 month follow up with hypertension, hyperlipidemia, prediabetes and vitamin D.   Her blood pressure has been controlled at home, today their BP is BP: 124/60.   She does not workout. She denies chest pain, shortness of breath, dizziness. She has history Dec 2017, underwent 4V CABG.  Son is here with her, doing her pill boxes, states that she is taking 4 different BP meds and she has been falling and dizzy, BP has been as low as 60's. He stopped everything x 1 month but the lisinopril and BP today is 124/60 and she is not falling as much. She has dementia and follows with Dr. Delice Lesch.   She is on cholesterol medication and denies myalgias. Her cholesterol is at goal. The cholesterol last visit was:   Lab Results  Component Value Date   CHOL 110 06/26/2016   HDL 37 (L) 06/26/2016   LDLCALC 57 06/26/2016   TRIG 82 06/26/2016   CHOLHDL 3.0 06/26/2016     She has not been working on diet and exercise for prediabetes, and denies foot ulcerations, hyperglycemia, hypoglycemia , increased appetite, nausea, paresthesia of the feet, polydipsia, polyuria, visual disturbances, vomiting and weight loss. Last A1C in the office was:  Lab Results   Component Value Date   HGBA1C 6.0 (H) 06/26/2016    Patient is on Vitamin D supplement.  Lab Results  Component Value Date   VD25OH 62 06/26/2016     She has been constipated and had only relief with enema.  She did try miralax with no relief.    Current Medications:  Current Outpatient Prescriptions on File Prior to Visit  Medication Sig Dispense Refill  . aspirin EC 325 MG EC tablet Take 1 tablet (325 mg total) by mouth daily. 30 tablet 0  . atorvastatin (LIPITOR) 80 MG tablet TAKE 1 TABLET BY MOUTH EVERY DAY 90 tablet 1  . blood glucose meter kit and supplies KIT Dispense based on patient and insurance preference. Check blood sugar 1 time daily-DX-E11.22. 1 each 0  . lisinopril (PRINIVIL,ZESTRIL) 2.5 MG tablet Take 1 tablet (2.5 mg total) by mouth daily. 30 tablet 1  . mirtazapine (REMERON) 15 MG tablet TAKE 1 TABLET BY MOUTH EVERY EVENING AT BEDTIME 30 tablet 1  . nitroGLYCERIN (NITROSTAT) 0.4 MG SL tablet Dissolve 1 tablet under tongue every 3 to 5 minutes if needed for ChestPain 50 tablet 12   No current facility-administered medications on file prior to visit.     Medical History:  Past Medical History:  Diagnosis Date  . Anemia   . Anginal pain ([place])   . Cataract   . Colon polyp 2009    TUBULAR ADENOMA  . Coronary artery disease   . Depression   . Diabetes ([place])   .  Diverticulosis of colon (without mention of hemorrhage) 2009  . Esophagitis 2009  . Gastritis 2009  . GERD (gastroesophageal reflux disease) 2009  . Hiatal hernia 2009  . Hyperlipemia   . Hypertension   . Thyroid disease    hypothyroid    Allergies:  Allergies  Allergen Reactions  . Bee Venom Anaphylaxis and Other (See Comments)    Eyes swell, cannot breathe  . Persantine [Dipyridamole] Other (See Comments)    "went crazy"  . Actos [Pioglitazone] Other (See Comments)    UNSPECIFIED REACTION      Review of Systems:  Review of Systems  Constitutional: Negative for chills, fever and  malaise/fatigue.  HENT: Negative for congestion, ear pain and sore throat.   Respiratory: Negative for cough, shortness of breath and wheezing.   Cardiovascular: Negative for chest pain, palpitations and leg swelling.  Gastrointestinal: Positive for constipation. Negative for abdominal pain, blood in stool, diarrhea, heartburn and melena.  Genitourinary: Negative.   Skin: Negative.   Neurological: Negative for dizziness, sensory change, loss of consciousness and headaches.  Psychiatric/Behavioral: Positive for memory loss. Negative for depression. The patient is not nervous/anxious and does not have insomnia.     Family history- Review and unchanged  Social history- Review and unchanged  Physical Exam: BP 124/60   Pulse 95   Temp (!) 97.3 F (36.3 C)   Resp 14   Ht 5' 1.5" (1.562 m)   Wt 118 lb 6.4 oz (53.7 kg)   LMP  (LMP Unknown)   SpO2 96%   BMI 22.01 kg/m  Wt Readings from Last 3 Encounters:  10/03/16 118 lb 6.4 oz (53.7 kg)  07/30/16 116 lb 11.2 oz (52.9 kg)  07/24/16 117 lb (53.1 kg)    General Appearance: Well nourished well developed, in no apparent distress. Eyes: PERRLA, EOMs, conjunctiva no swelling or erythema ENT/Mouth: Ear canals normal without obstruction, swelling, erythma, discharge.  TMs normal bilaterally.  Oropharynx moist, clear, without exudate, or postoropharyngeal swelling. Neck: Supple, thyroid normal,no cervical adenopathy  Respiratory: Respiratory effort normal, Breath sounds clear A&P without rhonchi, wheeze, or rale.  No retractions, no accessory usage. Cardio: RRR with no MRGs. Brisk peripheral pulses without edema.  Abdomen: Soft, + BS,  Non tender, no guarding, rebound, hernias, masses. Musculoskeletal: Full ROM, 5/5 strength, Normal gait Skin: Warm, dry without rashes, lesions, ecchymosis.  Neuro: Awake and oriented X 3, Cranial nerves intact. Normal muscle tone, no cerebellar symptoms. Psych: Normal affect, Insight and Judgment appropriate.     Vicie Mutters, PA-C 4:16 PM [facility]

## 2016-10-03 ENCOUNTER — Ambulatory Visit (INDEPENDENT_AMBULATORY_CARE_PROVIDER_SITE_OTHER): Payer: Medicare Other | Admitting: Physician Assistant

## 2016-10-03 ENCOUNTER — Encounter: Payer: Self-pay | Admitting: Physician Assistant

## 2016-10-03 VITALS — BP 124/60 | HR 95 | Temp 97.3°F | Resp 14 | Ht 61.5 in | Wt 118.4 lb

## 2016-10-03 DIAGNOSIS — E782 Mixed hyperlipidemia: Secondary | ICD-10-CM

## 2016-10-03 DIAGNOSIS — N182 Chronic kidney disease, stage 2 (mild): Secondary | ICD-10-CM | POA: Diagnosis not present

## 2016-10-03 DIAGNOSIS — E663 Overweight: Secondary | ICD-10-CM

## 2016-10-03 DIAGNOSIS — E039 Hypothyroidism, unspecified: Secondary | ICD-10-CM

## 2016-10-03 DIAGNOSIS — Z79899 Other long term (current) drug therapy: Secondary | ICD-10-CM | POA: Diagnosis not present

## 2016-10-03 DIAGNOSIS — I200 Unstable angina: Secondary | ICD-10-CM | POA: Diagnosis not present

## 2016-10-03 DIAGNOSIS — E1122 Type 2 diabetes mellitus with diabetic chronic kidney disease: Secondary | ICD-10-CM | POA: Diagnosis not present

## 2016-10-03 DIAGNOSIS — I10 Essential (primary) hypertension: Secondary | ICD-10-CM | POA: Diagnosis not present

## 2016-10-03 MED ORDER — METFORMIN HCL ER 500 MG PO TB24: 500.0000 mg | ORAL_TABLET | Freq: Two times a day (BID) | ORAL | 1 refills | Status: DC

## 2016-10-03 MED ORDER — LACTULOSE 10 GM/15ML PO SOLN: 20.0000 g | Freq: Two times a day (BID) | ORAL | 0 refills | Status: DC

## 2016-10-03 NOTE — Patient Instructions (Signed)
Lactulose, can do 10mg  once or twice a day, can increase to 20mg  twice a day for constipation  About Constipation  Constipation Overview Constipation is the most common gastrointestinal complaint - about 4 million Americans experience constipation and make 2.5 million physician visits a year to get help for the problem.  Constipation can occur when the colon absorbs too much water, the colon's muscle contraction is slow or sluggish, and/or there is delayed transit time through the colon.  The result is stool that is hard and dry.  Indicators of constipation include straining during bowel movements greater than 25% of the time, having fewer than three bowel movements per week, and/or the feeling of incomplete evacuation.  There are established guidelines ([place] II ) for defining constipation. A person needs to have two or more of the following symptoms for at least 12 weeks (not necessarily consecutive) in the preceding 12 months: . Straining in  greater than 25% of bowel movements . Lumpy or hard stools in greater than 25% of bowel movements . Sensation of incomplete emptying in greater than 25% of bowel movements . Sensation of anorectal obstruction/blockade in greater than 25% of bowel movements . Manual maneuvers to help empty greater than 25% of bowel movements (e.g., digital evacuation, support of the pelvic floor)  . Less than  3 bowel movements/week . Loose stools are not present, and criteria for irritable bowel syndrome are insufficient  Common Causes of Constipation . Lack of fiber in your diet . Lack of physical activity . Medications, including iron and calcium supplements  . Dairy intake . Dehydration . Abuse of laxatives  Travel  Irritable Bowel Syndrome  Pregnancy  Luteal phase of menstruation (after ovulation and before menses)  Colorectal problems  Intestinal Dysfunction  Treating Constipation  There are several ways of treating constipation, including changes to  diet and exercise, use of laxatives, adjustments to the pelvic floor, and scheduled toileting.  These treatments include: . increasing fiber and fluids in the diet  . increasing physical activity . learning muscle coordination   learning proper toileting techniques and toileting modifications   designing and sticking  to a toileting schedule     2007, [employer] Doc.22

## 2016-10-04 LAB — HEPATIC FUNCTION PANEL
AG Ratio: 1.3 (calc) (ref 1.0–2.5)
ALT: 7 U/L (ref 6–29)
AST: 12 U/L (ref 10–35)
Albumin: 4 g/dL (ref 3.6–5.1)
Alkaline phosphatase (APISO): 41 U/L (ref 33–130)
Bilirubin, Direct: 0.1 mg/dL (ref 0.0–0.2)
Globulin: 3.2 g/dL (calc) (ref 1.9–3.7)
Indirect Bilirubin: 0.5 mg/dL (calc) (ref 0.2–1.2)
Total Bilirubin: 0.6 mg/dL (ref 0.2–1.2)
Total Protein: 7.2 g/dL (ref 6.1–8.1)

## 2016-10-04 LAB — BASIC METABOLIC PANEL WITH GFR
BUN: 10 mg/dL (ref 7–25)
CO2: 23 mmol/L (ref 20–32)
Calcium: 9 mg/dL (ref 8.6–10.4)
Chloride: 106 mmol/L (ref 98–110)
Creat: 0.72 mg/dL (ref 0.60–0.88)
GFR, Est African American: 90 mL/min/{1.73_m2} (ref >=60)
GFR, Est Non African American: 77 mL/min/{1.73_m2} (ref >=60)
Glucose, Bld: 114 mg/dL — ABNORMAL HIGH (ref 65–99)
Potassium: 4.7 mmol/L (ref 3.5–5.3)
Sodium: 139 mmol/L (ref 135–146)

## 2016-10-04 LAB — CBC WITH DIFFERENTIAL/PLATELET
Basophils Absolute: 20 cells/uL (ref 0–200)
Basophils Relative: 0.4 %
Eosinophils Absolute: 10 cells/uL — ABNORMAL LOW (ref 15–500)
Eosinophils Relative: 0.2 %
HCT: 33.4 % — ABNORMAL LOW (ref 35.0–45.0)
Hemoglobin: 11.4 g/dL — ABNORMAL LOW (ref 11.7–15.5)
Lymphs Abs: 2120 cells/uL (ref 850–3900)
MCH: 30 pg (ref 27.0–33.0)
MCHC: 34.1 g/dL (ref 32.0–36.0)
MCV: 87.9 fL (ref 80.0–100.0)
MPV: 10.3 fL (ref 7.5–12.5)
Monocytes Relative: 17.1 %
Neutro Abs: 1995 cells/uL (ref 1500–7800)
Neutrophils Relative %: 39.9 %
Platelets: 228 10*3/uL (ref 140–400)
RBC: 3.8 10*6/uL (ref 3.80–5.10)
RDW: 13 % (ref 11.0–15.0)
Total Lymphocyte: 42.4 %
WBC mixed population: 855 cells/uL (ref 200–950)
WBC: 5 10*3/uL (ref 3.8–10.8)

## 2016-10-04 LAB — LIPID PANEL
Cholesterol: 121 mg/dL (ref <200)
HDL: 49 mg/dL — ABNORMAL LOW (ref >50)
LDL Cholesterol (Calc): 55 mg/dL (calc)
Non-HDL Cholesterol (Calc): 72 mg/dL (calc) (ref <130)
Total CHOL/HDL Ratio: 2.5 (calc) (ref <5.0)
Triglycerides: 90 mg/dL (ref <150)

## 2016-10-04 LAB — HEMOGLOBIN A1C
Hgb A1c MFr Bld: 6.2 % of total Hgb — ABNORMAL HIGH (ref <5.7)
Mean Plasma Glucose: 131 (calc)
eAG (mmol/L): 7.3 (calc)

## 2016-10-04 LAB — TSH: TSH: 0.56 mIU/L (ref 0.40–4.50)

## 2016-10-04 NOTE — Progress Notes (Signed)
Pt's son was made aware of lab results & voiced understanding of those results.

## 2016-10-25 ENCOUNTER — Other Ambulatory Visit: Payer: Self-pay | Admitting: Cardiology

## 2016-10-25 NOTE — Telephone Encounter (Signed)
Please call office and schedule appointment for 12/18 for further refills  [phone]

## 2016-10-31 LAB — HM DIABETES EYE EXAM

## 2016-12-02 ENCOUNTER — Encounter: Payer: Self-pay | Admitting: Internal Medicine

## 2016-12-04 ENCOUNTER — Encounter: Payer: Self-pay | Admitting: Internal Medicine

## 2016-12-23 ENCOUNTER — Other Ambulatory Visit: Payer: Self-pay | Admitting: Internal Medicine

## 2017-01-05 NOTE — Patient Instructions (Signed)

## 2017-01-05 NOTE — Progress Notes (Signed)
[facility] [name], M.D.     Uvaldo Bristle. Silverio Lay, P.A.-C Liane Comber, [address] [place], [place] [address] Telephone [phone] Telefax [phone] Annual Screening/Preventative Visit & Comprehensive Evaluation &  Examination     This very nice 83 y.o. MWF presents for a Screening/Preventative Visit & comprehensive evaluation and management of multiple medical co-morbidities.  Patient has been followed for HTN, ASCAD,  Prediabetes, Hyperlipidemia and Vitamin D Deficiency. Patient's son is supervising her meds.       HTN predates since 51. Patient's BP has been controlled at home. In Dec 2017, post MI, she underwent 4V CABG.  Patient denies any cardiac symptoms as chest pain, palpitations, shortness of breath, dizziness or ankle swelling. Today's BP is elevated at 834/19 and it's uncertain if she's taken her meds this am.      Patient's hyperlipidemia is controlled with diet and medications. Patient denies myalgias or other medication SE's. Last lipids were at goal: Lab Results  Component Value Date   CHOL 121 10/03/2016   HDL 49 (L) 10/03/2016   LDLCALC 57 06/26/2016   TRIG 90 10/03/2016   CHOLHDL 2.5 10/03/2016      Patient has T2_NIDDM since 1996 and patient denies reactive hypoglycemic symptoms, visual blurring, diabetic polys, or paresthesias. Last A1c was not at goal: Lab Results  Component Value Date   HGBA1C 6.2 (H) 10/03/2016      Finally, patient has history of Vitamin D Deficiency ("27"/2008)  and last Vitamin D was low (goal 70-100): Lab Results  Component Value Date   VD25OH 43 06/26/2016   Current Outpatient Medications on File Prior to Visit  Medication Sig  . aspirin EC 325 MG EC tablet Take 1 tab  daily.  Marland Kitchen atorvastatin  80 MG tablet TAKE 1 TAB EVERY DAY  . lactulose  10 GM/15ML soln Take 30 mLs  2 x  daily.  Marland Kitchen lisinopril  2.5 MG tablet TAKE 1 TAB DAILY  .  metFORMIN-XR 500 MG  TAKE 2 TAB 2 x /  DAY- only takes 1 tab bid  . mirtazapine 15 MG tablet Patient not taking: Reported on 01/06/2017  . nitroGLYCERIN 0.4 MG Sl If needed for ChestPain    Allergies  Allergen Reactions  . Bee Venom Anaphylaxis and Other (See Comments)    Eyes swell, cannot breathe  . Persantine [Dipyridamole] Other (See Comments)    "went crazy"  . Actos [Pioglitazone] Other (See Comments)    UNSPECIFIED REACTION    Past Medical History:  Diagnosis Date  . Anemia   . Anginal pain ([place])   . Cataract   . Colon polyp 2009    TUBULAR ADENOMA  . Coronary artery disease   . Depression   . Diabetes ([place])   . Diverticulosis of colon (without mention of hemorrhage) 2009  . Esophagitis 2009  . Gastritis 2009  . GERD (gastroesophageal reflux disease) 2009  . Hiatal hernia 2009  . Hyperlipemia   . Hypertension   . Thyroid disease    hypothyroid   Health Maintenance  Topic Date Due  . TETANUS/TDAP  02/02/2016  . FOOT EXAM  10/30/2016  . HEMOGLOBIN A1C  04/02/2017  . OPHTHALMOLOGY EXAM  10/31/2017  . INFLUENZA VACCINE  Completed  . DEXA SCAN  Completed  . PNA vac Low Risk Adult  Completed   Immunization History  Administered Date(s) Administered  . Influenza Inj Mdck Quad Pf 10/31/2016  . Influenza Split 10/03/2014  .  Influenza,inj,quad, With Preservative 10/31/2015  . Influenza-Unspecified 11/18/2012  . Pneumococcal Conjugate-13 11/04/2013  . Pneumococcal Polysaccharide-23 07/25/2010  . Pneumococcal-Unspecified 02/01/2001  . Td 02/01/2006   Last Colon -  Last Pap -  Past Surgical History:  Procedure Laterality Date  . BREAST BIOPSY Left 1999   neg  . CARDIAC CATHETERIZATION N/A 11/15/2015   Procedure: Left Heart Cath and Coronary Angiography;  Surgeon: Troy Sine, MD;  Location: [facility];  Service: Cardiovascular;  Laterality: N/A;  . COLONOSCOPY  05/2012   Dr. Sharlett Iles  . CORONARY ARTERY BYPASS GRAFT  11/16/2015   Procedure: OFF PUMP  CORONARY ARTERY BYPASS GRAFTING (CABG) x FOUR, USING LEFT MAMMARY ARTERY  AND ENDOSCOPICALLY HARVESTED RIGHT GREATER SAPHENOUS VEIN.;  Surgeon: Melrose Nakayama, MD;  Location: [facility];  Service: Open Heart Surgery;;  . DILATION AND CURETTAGE OF UTERUS  1984  . TEE WITHOUT CARDIOVERSION N/A 11/16/2015   Procedure: TRANSESOPHAGEAL ECHOCARDIOGRAM (TEE);  Surgeon: Melrose Nakayama, MD;  Location: [facility];  Service: Open Heart Surgery;  Laterality: N/A;   Family History  Problem Relation Age of Onset  . Diabetes Father   . Stroke Mother   . Breast cancer Maternal Aunt   . Throat cancer Maternal Uncle   . Lung cancer Brother   . Prostate cancer Brother   . Breast cancer Maternal Grandmother   . Rectal cancer Neg Hx   . Stomach cancer Neg Hx   . Colon cancer Neg Hx   . Esophageal cancer Neg Hx    Social History   Tobacco Use  . Smoking status: Never Smoker  . Smokeless tobacco: Current User    Types: Snuff  Substance Use Topics  . Alcohol use: No  . Drug use: No    ROS Constitutional: Denies fever, chills, weight loss/gain, headaches, insomnia,  night sweats, and change in appetite. Does c/o fatigue. Eyes: Denies redness, blurred vision, diplopia, discharge, itchy, watery eyes.  ENT: Denies discharge, congestion, post nasal drip, epistaxis, sore throat, earache, hearing loss, dental pain, Tinnitus, Vertigo, Sinus pain, snoring.  Cardio: Denies chest pain, palpitations, irregular heartbeat, syncope, dyspnea, diaphoresis, orthopnea, PND, claudication, edema Respiratory: denies cough, dyspnea, DOE, pleurisy, hoarseness, laryngitis, wheezing.  Gastrointestinal: Denies dysphagia, heartburn, reflux, water brash, pain, cramps, nausea, vomiting, bloating, diarrhea, constipation, hematemesis, melena, hematochezia, jaundice, hemorrhoids Genitourinary: Denies dysuria, frequency, urgency, nocturia, hesitancy, discharge, hematuria, flank pain Breast: Breast lumps, nipple discharge, bleeding.   Musculoskeletal: Denies arthralgia, myalgia, stiffness, Jt. Swelling, pain, limp, and strain/sprain. Denies falls. Skin: Denies puritis, rash, hives, warts, acne, eczema, changing in skin lesion Neuro: No weakness, tremor, incoordination, spasms, paresthesia, pain Psychiatric: Denies confusion, memory loss, sensory loss. Denies Depression. Endocrine: Denies change in weight, skin, hair change, nocturia, and paresthesia, diabetic polys, visual blurring, hyper / hypo glycemic episodes.  Heme/Lymph: No excessive bleeding, bruising, enlarged lymph nodes.  Physical Exam  BP (!) 162/82   Pulse 76   Temp (!) 97.5 F (36.4 C)   Resp 16   Ht 5\' 1"  (1.549 m)   Wt 120 lb (54.4 kg)   LMP  (LMP Unknown)   BMI 22.67 kg/m   General Appearance: Well nourished, well groomed and in no apparent distress.  Eyes: PERRLA, EOMs, conjunctiva no swelling or erythema, normal fundi and vessels. Sinuses: No frontal/maxillary tenderness ENT/Mouth: EACs patent / TMs  nl. Nares clear without erythema, swelling, mucoid exudates. Oral hygiene is good. No erythema, swelling, or exudate. Tongue normal, non-obstructing. Tonsils not swollen or erythematous. Hearing normal.  Neck: Supple, thyroid normal. No bruits, nodes or JVD. Respiratory: Respiratory effort normal.  BS equal and clear bilateral without rales, rhonci, wheezing or stridor. Cardio: Heart sounds are normal with regular rate and rhythm and no murmurs, rubs or gallops. Peripheral pulses are normal and equal bilaterally without edema. No aortic or femoral bruits. Chest: Median Sternotomy scar.  Expansion is symmetric with normal excursions and percussion. Breasts: Symmetric, atrophic/pendulous without lumps, nipple discharge, retractions, or fibrocystic changes.  Abdomen: Flat, soft with bowel sounds active. Nontender, no guarding, rebound, hernias, masses, or organomegaly.  Lymphatics: Non tender without lymphadenopathy.  Musculoskeletal: Full ROM all  peripheral extremities, joint stability, 5/5 strength, and normal gait. Skin: Warm and dry without rashes, lesions, cyanosis, clubbing or  ecchymosis.  Neuro: Cranial nerves intact, reflexes equal bilaterally. Normal muscle tone, no cerebellar symptoms. Sensation intact to touch, vibratory and Monofilament to the toes bilaterally.  Pysch: Alert and oriented X 3, normal affect, Insight and Judgment appropriate.   Assessment and Plan  1. Annual Preventative Screening Examination   2. Essential hypertension  - EKG 12-Lead - Urinalysis, Routine w reflex microscopic - Microalbumin / creatinine urine ratio - CBC with Differential/Platelet - BASIC METABOLIC PANEL WITH GFR - Magnesium - TSH - lisinopril (PRINIVIL,ZESTRIL) 20 MG tablet; Take 1 tablet daily for BP & Diabetic Kidney Protection  Dispense: 90 tablet; Refill: 1  3. Hyperlipidemia, mixed  - EKG 12-Lead - Hepatic function panel - Lipid panel  4. Type 2 diabetes mellitus with stage 2 chronic kidney disease, without long-term current use of insulin (HCC)  - EKG 12-Lead - Urinalysis, Routine w reflex microscopic - Microalbumin / creatinine urine ratio - Hemoglobin A1c - Insulin, random  - HM DIABETES FOOT EXAM - LOW EXTREMITY NEUR EXAM DOCUM  - lisinopril  20 MG tablet; Take 1 tablet daily for BP & Diabetic Kidney Protection  Dispense: 90 tablet; Refill: 1  5. Vitamin D deficiency  - VITAMIN D 25 Hydroxy  6. Coronary artery disease involving coronary bypass graft with other forms of angina pectoris, unspecified whether native or transplanted heart ([place])  - EKG 12-Lead  7. Hypothyroidism  - TSH  8. Gastroesophageal reflux disease  - CBC with Differential/Platelet  9. Screening for colorectal cancer  - POC Hemoccult Bld/Stl (3-Cd Home Screen)  10. Screening for ischemic heart disease  - EKG 12-Lead  11. Medication management  - Urinalysis, Routine w reflex microscopic - Microalbumin / creatinine urine  ratio - CBC with Differential/Platelet - TSH  12. Insomnia, unspecified type  - mirtazapine (REMERON) 15 MG tablet; Take 1 tablet 1 hour before sleep  Dispense: 90 tablet; Refill: 1       Patient was counseled in prudent diet to achieve/maintain BMI less than 25 for weight control, BP monitoring, regular exercise and medications. Discussed med's effects and SE's. Screening labs and tests as requested with regular follow-up as recommended. Over 40 minutes of exam, counseling, chart review and high complex critical decision making was performed.

## 2017-01-06 ENCOUNTER — Encounter: Payer: Self-pay | Admitting: Internal Medicine

## 2017-01-06 ENCOUNTER — Ambulatory Visit (INDEPENDENT_AMBULATORY_CARE_PROVIDER_SITE_OTHER): Payer: Medicare Other | Admitting: Internal Medicine

## 2017-01-06 VITALS — BP 162/82 | HR 76 | Temp 97.5°F | Resp 16 | Ht 61.0 in | Wt 120.0 lb

## 2017-01-06 DIAGNOSIS — Z1212 Encounter for screening for malignant neoplasm of rectum: Secondary | ICD-10-CM

## 2017-01-06 DIAGNOSIS — E1122 Type 2 diabetes mellitus with diabetic chronic kidney disease: Secondary | ICD-10-CM

## 2017-01-06 DIAGNOSIS — E039 Hypothyroidism, unspecified: Secondary | ICD-10-CM

## 2017-01-06 DIAGNOSIS — K219 Gastro-esophageal reflux disease without esophagitis: Secondary | ICD-10-CM

## 2017-01-06 DIAGNOSIS — Z0000 Encounter for general adult medical examination without abnormal findings: Secondary | ICD-10-CM | POA: Diagnosis not present

## 2017-01-06 DIAGNOSIS — G4700 Insomnia, unspecified: Secondary | ICD-10-CM

## 2017-01-06 DIAGNOSIS — N182 Chronic kidney disease, stage 2 (mild): Secondary | ICD-10-CM

## 2017-01-06 DIAGNOSIS — E782 Mixed hyperlipidemia: Secondary | ICD-10-CM

## 2017-01-06 DIAGNOSIS — Z136 Encounter for screening for cardiovascular disorders: Secondary | ICD-10-CM | POA: Diagnosis not present

## 2017-01-06 DIAGNOSIS — Z79899 Other long term (current) drug therapy: Secondary | ICD-10-CM

## 2017-01-06 DIAGNOSIS — E559 Vitamin D deficiency, unspecified: Secondary | ICD-10-CM

## 2017-01-06 DIAGNOSIS — I10 Essential (primary) hypertension: Secondary | ICD-10-CM | POA: Diagnosis not present

## 2017-01-06 DIAGNOSIS — Z0001 Encounter for general adult medical examination with abnormal findings: Secondary | ICD-10-CM

## 2017-01-06 DIAGNOSIS — I25708 Atherosclerosis of coronary artery bypass graft(s), unspecified, with other forms of angina pectoris: Secondary | ICD-10-CM

## 2017-01-06 DIAGNOSIS — Z1211 Encounter for screening for malignant neoplasm of colon: Secondary | ICD-10-CM

## 2017-01-06 MED ORDER — LISINOPRIL 20 MG PO TABS: ORAL_TABLET | ORAL | 1 refills | Status: DC

## 2017-01-06 MED ORDER — MIRTAZAPINE 15 MG PO TABS: ORAL_TABLET | ORAL | 1 refills | Status: DC

## 2017-01-07 LAB — URINALYSIS, ROUTINE W REFLEX MICROSCOPIC
Bacteria, UA: NONE SEEN /HPF
Bilirubin Urine: NEGATIVE
Glucose, UA: NEGATIVE
Hyaline Cast: NONE SEEN /LPF
Ketones, ur: NEGATIVE
Leukocytes, UA: NEGATIVE
Nitrite: NEGATIVE
Protein, ur: NEGATIVE
RBC / HPF: NONE SEEN /HPF (ref 0–2)
Specific Gravity, Urine: 1.004 (ref 1.001–1.03)
Squamous Epithelial / LPF: NONE SEEN /HPF (ref <=5)
WBC, UA: NONE SEEN /HPF (ref 0–5)
pH: 6 (ref 5.0–8.0)

## 2017-01-07 LAB — MICROALBUMIN / CREATININE URINE RATIO
Creatinine, Urine: 27 mg/dL (ref 20–275)
Microalb Creat Ratio: 7 mcg/mg creat (ref <30)
Microalb, Ur: 0.2 mg/dL

## 2017-01-08 LAB — CBC WITH DIFFERENTIAL/PLATELET
Basophils Absolute: 22 cells/uL (ref 0–200)
Basophils Relative: 0.5 %
Eosinophils Absolute: 0 cells/uL — ABNORMAL LOW (ref 15–500)
Eosinophils Relative: 0 %
HCT: 33.1 % — ABNORMAL LOW (ref 35.0–45.0)
Hemoglobin: 11.4 g/dL — ABNORMAL LOW (ref 11.7–15.5)
Lymphs Abs: 1734 cells/uL (ref 850–3900)
MCH: 29.9 pg (ref 27.0–33.0)
MCHC: 34.4 g/dL (ref 32.0–36.0)
MCV: 86.9 fL (ref 80.0–100.0)
MPV: 10.3 fL (ref 7.5–12.5)
Monocytes Relative: 14.4 %
Neutro Abs: 2011 cells/uL (ref 1500–7800)
Neutrophils Relative %: 45.7 %
Platelets: 203 10*3/uL (ref 140–400)
RBC: 3.81 10*6/uL (ref 3.80–5.10)
RDW: 13.7 % (ref 11.0–15.0)
Total Lymphocyte: 39.4 %
WBC mixed population: 634 cells/uL (ref 200–950)
WBC: 4.4 10*3/uL (ref 3.8–10.8)

## 2017-01-08 LAB — HEMOGLOBIN A1C
Hgb A1c MFr Bld: 6.1 % of total Hgb — ABNORMAL HIGH (ref <5.7)
Mean Plasma Glucose: 128 (calc)
eAG (mmol/L): 7.1 (calc)

## 2017-01-08 LAB — HEPATIC FUNCTION PANEL
AG Ratio: 1.4 (calc) (ref 1.0–2.5)
ALT: 6 U/L (ref 6–29)
AST: 13 U/L (ref 10–35)
Albumin: 4.4 g/dL (ref 3.6–5.1)
Alkaline phosphatase (APISO): 43 U/L (ref 33–130)
Bilirubin, Direct: 0.2 mg/dL (ref 0.0–0.2)
Globulin: 3.2 g/dL (calc) (ref 1.9–3.7)
Indirect Bilirubin: 0.6 mg/dL (calc) (ref 0.2–1.2)
Total Bilirubin: 0.8 mg/dL (ref 0.2–1.2)
Total Protein: 7.6 g/dL (ref 6.1–8.1)

## 2017-01-08 LAB — MAGNESIUM: Magnesium: 1.6 mg/dL (ref 1.5–2.5)

## 2017-01-08 LAB — LIPID PANEL
Cholesterol: 114 mg/dL (ref <200)
HDL: 48 mg/dL — ABNORMAL LOW (ref >50)
LDL Cholesterol (Calc): 50 mg/dL (calc)
Non-HDL Cholesterol (Calc): 66 mg/dL (calc) (ref <130)
Total CHOL/HDL Ratio: 2.4 (calc) (ref <5.0)
Triglycerides: 79 mg/dL (ref <150)

## 2017-01-08 LAB — BASIC METABOLIC PANEL WITH GFR
BUN: 8 mg/dL (ref 7–25)
CO2: 26 mmol/L (ref 20–32)
Calcium: 9.4 mg/dL (ref 8.6–10.4)
Chloride: 103 mmol/L (ref 98–110)
Creat: 0.66 mg/dL (ref 0.60–0.88)
GFR, Est African American: 95 mL/min/{1.73_m2} (ref >=60)
GFR, Est Non African American: 82 mL/min/{1.73_m2} (ref >=60)
Glucose, Bld: 112 mg/dL — ABNORMAL HIGH (ref 65–99)
Potassium: 3.9 mmol/L (ref 3.5–5.3)
Sodium: 138 mmol/L (ref 135–146)

## 2017-01-08 LAB — INSULIN, RANDOM: Insulin: 3.7 u[IU]/mL (ref 2.0–19.6)

## 2017-01-08 LAB — TSH: TSH: 0.36 mIU/L — ABNORMAL LOW (ref 0.40–4.50)

## 2017-01-08 LAB — VITAMIN D 25 HYDROXY (VIT D DEFICIENCY, FRACTURES): Vit D, 25-Hydroxy: 35 ng/mL (ref 30–100)

## 2017-01-23 ENCOUNTER — Encounter: Payer: Self-pay | Admitting: *Deleted

## 2017-01-27 ENCOUNTER — Ambulatory Visit (INDEPENDENT_AMBULATORY_CARE_PROVIDER_SITE_OTHER): Payer: Medicare Other | Admitting: Neurology

## 2017-01-27 ENCOUNTER — Encounter: Payer: Self-pay | Admitting: Neurology

## 2017-01-27 VITALS — BP 142/70 | HR 116 | Ht 61.0 in | Wt 123.0 lb

## 2017-01-27 DIAGNOSIS — F0390 Unspecified dementia without behavioral disturbance: Secondary | ICD-10-CM

## 2017-01-27 DIAGNOSIS — F03A0 Unspecified dementia, mild, without behavioral disturbance, psychotic disturbance, mood disturbance, and anxiety: Secondary | ICD-10-CM

## 2017-01-27 NOTE — Progress Notes (Signed)
NEUROLOGY FOLLOW UP OFFICE NOTE  [name] [name] [id number] 12-Dec-1931  HISTORY OF PRESENT ILLNESS: I had the pleasure of seeing [name] [name] in follow-up in the neurology clinic on 01/27/2017.  The patient was last seen 6 months ago for mild dementia and is again accompanied by her son who helps supplement the history today. MMSE 24/30 in July 2018. On her last visit, we had discussed doing an MRI brain and starting Donepezil, which the patient declined. Her son reports she is doing a lot better. She feels the same way. She lives with her husband, but her son frequently comes by. He feels she is a little more on top of things. She continues to misplace things, she loses her purse frequently and puts mail somewhere that her son cannot find. She continues to pay the bills, but her son helps her and sometimes has to talk her into paying for something. He reports that some long term life insurance policies were lost because she forgot to pay them. She is in charge of water/electricity utility bills, she missed one a while back but now he keeps a closer eye on it. She was falling a lot until her BP medications were adjusted. Her son fixes their pillbox weekly. She does not drive. She is independent with dressing and bathing. She denies any headaches, dizziness, vision changes, focal numbness/tingling/weakness.   HPI 07/24/2016: This is an 84 yo RH woman with a history of hypertension, hyperlipidemia, diabetes, CAD s/p CABG, who presented for evaluation of worsening memory. She feels her memory is not as good as it used to be. Sometimes she forgets. Her son started noticing changes around 3 years ago. Initially she would misplace things or forget what he told her or what she was doing. She got lost coming home from Dr. Idell Pickles office last year, gone for 4 hours, and has not driven since. She does not remember this episode. Her son started helping with bill payments 6-8 months ago after her husband asked him to  help because she would miss payments. He fills out her pillbox, most of the time she remembers to take her medication, missing maybe 1 day a week of her night medications. He visits 2-3 times a week and calls daily to remind her to take the medication. Most bothersome for her son is not recognizing some of her grandchildren and forgetting his middle name 8 months ago. 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 She does laundry and is independent with dressing and bathing. No personality changes, paranoia, or hallucinations. Her son feels she is a little on the depressed side, she reports her brother and daughter passed away.  She has had a couple of falls, last month she fell going up steps. She fell in the living room prior to that with no clear reason, no loss of consciousness. She occasionally gets dizzy. She has constipation. Otherwise she denies any headaches, diplopia, dysarthria/dysphagia, neck/back pain, focal numbness/tingling/weakness, bladder dysfunction, anosmia, or tremors. No family history of dementia. No history of significant head injuries. No alcohol use.  PAST MEDICAL HISTORY: Past Medical History:  Diagnosis Date  . Anemia   . Anginal pain ([place])   . Cataract   . Colon polyp 2009    TUBULAR ADENOMA  . Coronary artery disease   . Depression   . Diabetes ([place])   . Diverticulosis of colon (without mention of hemorrhage) 2009  . Esophagitis 2009  . Gastritis 2009  .  GERD (gastroesophageal reflux disease) 2009  . Hiatal hernia 2009  . Hyperlipemia   . Hypertension   . Thyroid disease    hypothyroid    MEDICATIONS: Current Outpatient Medications on File Prior to Visit  Medication Sig Dispense Refill  . aspirin EC 325 MG EC tablet Take 1 tablet (325 mg total) by mouth daily. 30 tablet 0  . atorvastatin (LIPITOR) 80 MG tablet TAKE 1 TABLET BY MOUTH EVERY DAY 90 tablet 1  . blood glucose meter kit and supplies KIT Dispense based on patient and  insurance preference. Check blood sugar 1 time daily-[id number]. 1 each 0  . lactulose (CHRONULAC) 10 GM/15ML solution Take 30 mLs (20 g total) by mouth 2 (two) times daily. 240 mL 0  . lisinopril (PRINIVIL,ZESTRIL) 20 MG tablet Take 1 tablet daily for BP & Diabetic Kidney Protection 90 tablet 1  . metFORMIN (GLUCOPHAGE-XR) 500 MG 24 hr tablet TAKE 2 TABLETS BY MOUTH TWICE A DAY 120 tablet 1  . mirtazapine (REMERON) 15 MG tablet Take 1 tablet 1 hour before sleep 90 tablet 1  . nitroGLYCERIN (NITROSTAT) 0.4 MG SL tablet Dissolve 1 tablet under tongue every 3 to 5 minutes if needed for ChestPain 50 tablet 12   No current facility-administered medications on file prior to visit.     ALLERGIES: Allergies  Allergen Reactions  . Bee Venom Anaphylaxis and Other (See Comments)    Eyes swell, cannot breathe  . Persantine [Dipyridamole] Other (See Comments)    "went crazy"  . Actos [Pioglitazone] Other (See Comments)    UNSPECIFIED REACTION     FAMILY HISTORY: Family History  Problem Relation Age of Onset  . Diabetes Father   . Stroke Mother   . Breast cancer Maternal Aunt   . Throat cancer Maternal Uncle   . Lung cancer Brother   . Prostate cancer Brother   . Breast cancer Maternal Grandmother   . Rectal cancer Neg Hx   . Stomach cancer Neg Hx   . Colon cancer Neg Hx   . Esophageal cancer Neg Hx     SOCIAL HISTORY: Social History   Socioeconomic History  . Marital status: Married    Spouse name: Not on file  . Number of children: 3  . Years of education: Not on file  . Highest education level: Not on file  Social Needs  . Financial resource strain: Not on file  . Food insecurity - worry: Not on file  . Food insecurity - inability: Not on file  . Transportation needs - medical: Not on file  . Transportation needs - non-medical: Not on file  Occupational History  . Not on file  Tobacco Use  . Smoking status: Never Smoker  . Smokeless tobacco: Current User    Types: Snuff    Substance and Sexual Activity  . Alcohol use: No  . Drug use: No  . Sexual activity: Not on file  Other Topics Concern  . Not on file  Social History Narrative  . Not on file    REVIEW OF SYSTEMS: Constitutional: No fevers, chills, or sweats, no generalized fatigue, change in appetite Eyes: No visual changes, double vision, eye pain Ear, nose and throat: No hearing loss, ear pain, nasal congestion, sore throat Cardiovascular: No chest pain, palpitations Respiratory:  No shortness of breath at rest or with exertion, wheezes GastrointestinaI: No nausea, vomiting, diarrhea, abdominal pain, fecal incontinence Genitourinary:  No dysuria, urinary retention or frequency Musculoskeletal:  No neck pain, back pain Integumentary: No  rash, pruritus, skin lesions Neurological: as above Psychiatric: No depression, insomnia, anxiety Endocrine: No palpitations, fatigue, diaphoresis, mood swings, change in appetite, change in weight, increased thirst Hematologic/Lymphatic:  No anemia, purpura, petechiae. Allergic/Immunologic: no itchy/runny eyes, nasal congestion, recent allergic reactions, rashes  PHYSICAL EXAM: Vitals:   01/27/17 1506  BP: (!) 142/70  Pulse: (!) 116  SpO2: 98%   General: No acute distress Head:  Normocephalic/atraumatic Neck: supple, no paraspinal tenderness, full range of motion Heart:  Regular rate and rhythm Lungs:  Clear to auscultation bilaterally Back: No paraspinal tenderness Skin/Extremities: No rash, no edema Neurological Exam: alert and oriented to person, place, and season (states it is [place], 2017. It is Mon, 2019). No aphasia or dysarthria. Fund of knowledge is appropriate.  Recent and remote memory are impaired.  Attention and concentration are normal.    Able to name objects and repeat phrases. CDT 5/5 MMSE - Mini Mental State Exam 01/27/2017 07/24/2016  Orientation to time 1 2  Orientation to Place 5 5  Registration 3 3  Attention/ Calculation 5 5   Recall 0 0  Language- name 2 objects 2 2  Language- repeat 1 1  Language- follow 3 step command 3 3  Language- read & follow direction 1 1  Write a sentence 1 1  Copy design 1 1  Total score 23 24    Cranial nerves: Pupils equal, round, reactive to light. Extraocular movements intact with no nystagmus. Visual fields full. Facial sensation intact. No facial asymmetry. Tongue, uvula, palate midline.  Motor: Bulk and tone normal, muscle strength 5/5 throughout with no pronator drift.  Sensation to light touch intact.  No extinction to double simultaneous stimulation.  Deep tendon reflexes +1 throughout, toes downgoing.  Finger to nose testing intact.  Gait narrow-based and steady, unaable to tandem walk adequately.  Romberg negative.  IMPRESSION: This is an 84 yo RH woman with a history of hypertension, hyperlipidemia, diabetes, CAD s/p CABG, with mild dementia. MMSE today 23/30 (24/30 in July 2018). We again discussed doing an MRI brain without contrast to assess for underlying structural abnormality and assess vascular load, as well as starting Donepezil, which she declines at this time. Her son knows to call our office for any change in symptoms. We again discussed the importance of control of vascular risk factors, physical exercise, and brain stimulation exercises for brain health. She is not driving. She will follow-up for re-evaluation in 6 months and knows to call for any changes.  Thank you for allowing me to participate in her care.  Please do not hesitate to call for any questions or concerns.  The duration of this appointment visit was 25 minutes of face-to-face time with the patient.  Greater than 50% of this time was spent in counseling, explanation of diagnosis, planning of further management, and coordination of care.   Ellouise Newer, M.D.   CC: Dr. Melford Aase

## 2017-01-27 NOTE — Patient Instructions (Signed)
Continue to monitor memory. If symptoms change, call our office. Follow-up in 6 months, call for any changes  FALL PRECAUTIONS: Be cautious when walking. Scan the area for obstacles that may increase the risk of trips and falls. When getting up in the mornings, sit up at the edge of the bed for a few minutes before getting out of bed. Consider elevating the bed at the head end to avoid drop of blood pressure when getting up. Walk always in a well-lit room (use night lights in the walls). Avoid area rugs or power cords from appliances in the middle of the walkways. Use a walker or a cane if necessary and consider physical therapy for balance exercise. Get your eyesight checked regularly.  FINANCIAL OVERSIGHT: Supervision, especially oversight when making financial decisions or transactions is also recommended.  HOME SAFETY: Consider the safety of the kitchen when operating appliances like stoves, microwave oven, and blender. Consider having supervision and share cooking responsibilities until no longer able to participate in those. Accidents with firearms and other hazards in the house should be identified and addressed as well.  DRIVING: Regarding driving, in patients with progressive memory problems, driving will be impaired. We advise to have someone else do the driving if trouble finding directions or if minor accidents are reported. Independent driving assessment is available to determine safety of driving.  ABILITY TO BE LEFT ALONE: If patient is unable to contact 911 operator, consider using LifeLine, or when the need is there, arrange for someone to stay with patients. Smoking is a fire hazard, consider supervision or cessation. Risk of wandering should be assessed by caregiver and if detected at any point, supervision and safe proof recommendations should be instituted.  MEDICATION SUPERVISION: Inability to self-administer medication needs to be constantly addressed. Implement a mechanism to  ensure safe administration of the medications.  RECOMMENDATIONS FOR ALL PATIENTS WITH MEMORY PROBLEMS: 1. Continue to exercise (Recommend 30 minutes of walking everyday, or 3 hours every week) 2. Increase social interactions - continue going to [place] and enjoy social gatherings with friends and family 3. Eat healthy, avoid fried foods and eat more fruits and vegetables 4. Maintain adequate blood pressure, blood sugar, and blood cholesterol level. Reducing the risk of stroke and cardiovascular disease also helps promoting better memory. 5. Avoid stressful situations. Live a simple life and avoid aggravations. Organize your time and prepare for the next day in anticipation. 6. Sleep well, avoid any interruptions of sleep and avoid any distractions in the bedroom that may interfere with adequate sleep quality 7. Avoid sugar, avoid sweets as there is a strong link between excessive sugar intake, diabetes, and cognitive impairment We discussed the Mediterranean diet, which has been shown to help patients reduce the risk of progressive memory disorders and reduces cardiovascular risk. This includes eating fish, eat fruits and green leafy vegetables, nuts like almonds and hazelnuts, walnuts, and also use olive oil. Avoid fast foods and fried foods as much as possible. Avoid sweets and sugar as sugar use has been linked to worsening of memory function.  There is always a concern of gradual progression of memory problems. If this is the case, then we may need to adjust level of care according to patient needs. Support, both to the patient and caregiver, should then be put into place.

## 2017-02-03 ENCOUNTER — Encounter: Payer: Self-pay | Admitting: Neurology

## 2017-02-04 ENCOUNTER — Other Ambulatory Visit: Payer: Self-pay | Admitting: *Deleted

## 2017-02-04 DIAGNOSIS — N182 Chronic kidney disease, stage 2 (mild): Secondary | ICD-10-CM

## 2017-02-04 DIAGNOSIS — E1122 Type 2 diabetes mellitus with diabetic chronic kidney disease: Secondary | ICD-10-CM

## 2017-02-04 DIAGNOSIS — I10 Essential (primary) hypertension: Secondary | ICD-10-CM

## 2017-02-04 MED ORDER — LISINOPRIL 20 MG PO TABS: ORAL_TABLET | ORAL | 1 refills | Status: DC

## 2017-02-17 ENCOUNTER — Other Ambulatory Visit: Payer: Self-pay | Admitting: Internal Medicine

## 2017-02-23 ENCOUNTER — Other Ambulatory Visit: Payer: Self-pay | Admitting: Internal Medicine

## 2017-03-08 ENCOUNTER — Other Ambulatory Visit: Payer: Self-pay | Admitting: Internal Medicine

## 2017-04-07 DIAGNOSIS — E119 Type 2 diabetes mellitus without complications: Secondary | ICD-10-CM | POA: Insufficient documentation

## 2017-04-07 NOTE — Progress Notes (Signed)
MEDICARE ANNUAL WELLNESS VISIT AND FOLLOW UP  Assessment:   Diagnoses and all orders for this visit:  Encounter for Medicare annual wellness exam  Essential hypertension Continue medications Monitor blood pressure at home; call if consistently over 130/80 Continue DASH diet.   Reminder to go to the ER if any CP, SOB, nausea, dizziness, severe HA, changes vision/speech, left arm numbness and tingling and jaw pain.  Coronary artery disease involving coronary bypass graft with other forms of angina pectoris, unspecified whether native or transplanted heart (HCC) S/p CABG x 4, no further chest pain Control blood pressure, cholesterol, glucose, increase exercise.  Followed by cardiology  Gastroesophageal reflux disease, esophagitis presence not specified Well managed off of medications at this time - no problems in over a year Discussed diet, avoiding triggers and other lifestyle changes  Acquired hypothyroidism Currently borderline hyperthyroid off of medications with monitoring only Check TSH, refer as indicated for follow up if getting worse  Type 2 diabetes mellitus with stage 2 chronic kidney disease, without long-term current use of insulin [facility]) Education: Reviewed 'ABCs' of diabetes management (respective goals in parentheses):  A1C (<7), blood pressure (<130/80), and cholesterol (LDL <70) Eye Exam yearly (verified currently up to date) and Dental Exam every 6 months. Defer foot exam as just had at CPE last visit- Dietary recommendations Physical Activity recommendations  CKD stage 2 due to type 2 diabetes mellitus (HCC) Increase fluids, avoid NSAIDS, monitor sugars, will monitor BMP/GFR  Mild dementia Monitoring only; independent for most ADLs, driven by son/daughter in law with some supervision for medication management and bills Control blood pressure, cholesterol, glucose, emphasized need for increased exercise.  Followed by Dr. Delice Lesch  Vitamin D  deficiency Continue supplementation as tolerated for Check vitamin D level  S/P CABG x 4 Control blood pressure, cholesterol, glucose, increase exercise.   Overweight (BMI 25.0-29.9) Long discussion about weight loss, diet, and exercise Recommended diet heavy in fruits and veggies and low in animal meats, cheeses, and dairy products, appropriate calorie intake Discussed appropriate weight for height Follow up at next visit  Medication management CBC, BMP/GFR, LFT  Hyperlipidemia Continue medications: atorvastatin  Continue low cholesterol diet and exercise.  Check lipid panel.   Due for tetanus vaccination Td administered today  Over 40 minutes of exam, counseling, chart review and critical decision making was performed Future Appointments  Date Time Provider [place]  07/14/2017  4:00 PM [name], MD [facility] None  08/04/2017  3:00 PM Cameron Sprang, MD [facility] None  02/02/2018  2:00 PM [name], MD [facility] None     Plan:   During the course of the visit the patient was educated and counseled about appropriate screening and preventive services including:    Pneumococcal vaccine   Prevnar 13  Influenza vaccine  Td vaccine  Screening electrocardiogram  Bone densitometry screening  Colorectal cancer screening  Diabetes screening  Glaucoma screening  Nutrition counseling   Advanced directives: requested   Subjective:  [name] [name] is a 84 y.o. female with hx of MI with 4V CABG in 2017 who presents for Medicare Annual Wellness Visit and 3 month follow up. She has mild dementia followed by neurology Dr. Delice Lesch. Presents accompanied by her son today who drives her to appointments as she no longer drives; he also assists with medication management and supervises bills as she occasionally forgets to pay. She is otherwise independent and lives with her husband who is nearly blind.   BMI is Body mass index is  24.75 kg/m., she has  not been working on diet and exercise. Wt Readings from Last 3 Encounters:  04/09/17 131 lb (59.4 kg)  01/27/17 123 lb (55.8 kg)  01/06/17 120 lb (54.4 kg)    Her blood pressure has been controlled at home, today their BP is BP: 132/62 She does not workout. She denies chest pain, shortness of breath, dizziness.   She is on cholesterol medication (atorvastatin 80 mg daily) and denies myalgias. Her cholesterol is at goal. The cholesterol last visit was:   Lab Results  Component Value Date   CHOL 114 01/06/2017   HDL 48 (L) 01/06/2017   LDLCALC 50 01/06/2017   TRIG 79 01/06/2017   CHOLHDL 2.4 01/06/2017    She has not been working on diet and exercise for T2 diabetes controlled on metoformin, and denies foot ulcerations, hyperglycemia, hypoglycemia , increased appetite, nausea, polydipsia, polyuria, visual disturbances, vomiting and weight loss. Last A1C in the office was:  Lab Results  Component Value Date   HGBA1C 6.1 (H) 01/06/2017   She has hx of thyroid disease but has been borderline hyperthyroid, not on medications, and has been monitored closely:    Lab Results  Component Value Date   TSH 0.36 (L) 01/06/2017   Last GFR: Lab Results  Component Value Date   GFRNONAA 82 01/06/2017   Patient is not on Vitamin D supplement and remains well below goal of 70 at most recent check:   Lab Results  Component Value Date   VD25OH 35 01/06/2017      Medication Review: Current Outpatient Medications on File Prior to Visit  Medication Sig Dispense Refill  . aspirin EC 325 MG EC tablet Take 1 tablet (325 mg total) by mouth daily. 30 tablet 0  . atorvastatin (LIPITOR) 80 MG tablet TAKE 1 TABLET DAILY FOR CHOLESTEROL 90 tablet 1  . blood glucose meter kit and supplies KIT Dispense based on patient and insurance preference. Check blood sugar 1 time daily-[id number]. 1 each 0  . metFORMIN (GLUCOPHAGE-XR) 500 MG 24 hr tablet TAKE 2 TABLETS BY MOUTH TWICE A DAY 120 tablet 1  . mirtazapine  (REMERON) 15 MG tablet Take 1 tablet 1 hour before sleep 90 tablet 1  . verapamil (CALAN-SR) 240 MG CR tablet TAKE 1 TABLET AT BEDTIME 90 tablet 1  . isosorbide mononitrate (IMDUR) 30 MG 24 hr tablet TAKE 1 TABLET DAILY (Patient not taking: Reported on 04/09/2017) 90 tablet 1  . lactulose (CHRONULAC) 10 GM/15ML solution Take 30 mLs (20 g total) by mouth 2 (two) times daily. (Patient not taking: Reported on 04/09/2017) 240 mL 0  . lisinopril (PRINIVIL,ZESTRIL) 20 MG tablet Take 1 tablet daily for BP & Diabetic Kidney Protection (Patient not taking: Reported on 04/09/2017) 90 tablet 1  . nitroGLYCERIN (NITROSTAT) 0.4 MG SL tablet Dissolve 1 tablet under tongue every 3 to 5 minutes if needed for ChestPain 50 tablet 12   No current facility-administered medications on file prior to visit.     Allergies  Allergen Reactions  . Bee Venom Anaphylaxis and Other (See Comments)    Eyes swell, cannot breathe  . Persantine [Dipyridamole] Other (See Comments)    "went crazy"  . Actos [Pioglitazone] Other (See Comments)    UNSPECIFIED REACTION     Current Problems (verified) Patient Active Problem List   Diagnosis Date Noted  . Type 2 diabetes mellitus ([place]) 04/07/2017  . Mild dementia 07/26/2016  . S/P CABG x 4   . Coronary artery disease 11/16/2015  .  Unstable angina ([place]) 11/15/2015  . Medication management 07/27/2013  . Vitamin D deficiency 02/01/2013  . GERD (gastroesophageal reflux disease)   . Hypothyroidism   . CKD stage 2 due to type 2 diabetes mellitus ([place]) 06/04/2007  . Hyperlipidemia 06/04/2007  . Overweight (BMI 25.0-29.9) 06/04/2007  . Essential hypertension 06/04/2007    Screening Tests Immunization History  Administered Date(s) Administered  . Influenza Inj Mdck Quad Pf 10/31/2016  . Influenza Split 10/03/2014  . Influenza,inj,quad, With Preservative 10/31/2015  . Influenza-Unspecified 11/18/2012  . Pneumococcal Conjugate-13 11/04/2013  . Pneumococcal Polysaccharide-23  07/25/2010  . Pneumococcal-Unspecified 02/01/2001  . Td 02/01/2006   Preventative care: Last colonoscopy: 2014 Last mammogram: 2007 - declines Last pap smear/pelvic exam: 2012 DEXA:2015 late osteopenia - declines  Prior vaccinations: TD or Tdap: 2019  Influenza: 2018  Pneumococcal: 2003, 2012 Prevnar13: 2015 Shingles/Zostavax: Declined due to cost  Names of Other Physician/Practitioners you currently use: 1. [facility]- here for primary care 2. Dr. Katy Fitch, eye doctor, last visit 10/31/2016 report confirmed and abstracted 3. Does not see, dentist, last visit unknown - full dentures  Patient Care Team: [name], MD as PCP - General (Internal Medicine) Warden Fillers, MD as Consulting Physician (Optometry) Stanford Breed, Denice Bors, MD as Consulting Physician (Cardiology) Sable Feil, MD as Consulting Physician (Gastroenterology)  SURGICAL HISTORY She  has a past surgical history that includes Dilation and curettage of uterus (1984); Breast biopsy (Left, 1999); Colonoscopy (05/2012); Cardiac catheterization (N/A, 11/15/2015); TEE without cardioversion (N/A, 11/16/2015); and Coronary artery bypass graft (11/16/2015). FAMILY HISTORY Her family history includes Breast cancer in her maternal aunt and maternal grandmother; Diabetes in her father; Lung cancer in her brother; Prostate cancer in her brother; Stroke in her mother; Throat cancer in her maternal uncle. SOCIAL HISTORY She  reports that she has never smoked. Her smokeless tobacco use includes snuff. She reports that she does not drink alcohol or use drugs.   MEDICARE WELLNESS OBJECTIVES: Physical activity: Current Exercise Habits: The patient does not participate in regular exercise at present, Exercise limited by: None identified Cardiac risk factors: Cardiac Risk Factors include: advanced age (>62mn, >>14women);diabetes mellitus;dyslipidemia;hypertension;sedentary  lifestyle;smoking/ tobacco exposure Depression/mood screen:   Depression screen [facility] 04/09/2017  Decreased Interest 0  Down, Depressed, Hopeless 0  PHQ - 2 Score 0  Altered sleeping -  Tired, decreased energy -  Change in appetite -  Feeling bad or failure about yourself  -  Trouble concentrating -  Moving slowly or fidgety/restless -  Suicidal thoughts -  PHQ-9 Score -  Difficult doing work/chores -    ADLs:  In your present state of health, do you have any difficulty performing the following activities: 04/09/2017 01/06/2017  Hearing? N Y  Comment - has hearing aids  Vision? N N  Difficulty concentrating or making decisions? Y N  Comment Followed by neurology -  Walking or climbing stairs? N N  Comment Does well with handrail -  Dressing or bathing? N N  Doing errands, shopping? Y N  Comment No license, driven by son, daughter in lSports coach-  PConservation officer, natureand eating ? N -  Using the Toilet? N -  In the past six months, have you accidently leaked urine? N -  Do you have problems with loss of bowel control? N -  Managing your Medications? Y -  Comment Son arranges for her -  Managing your Finances? Y -  Comment She handles but son supervises as has missed  payments -  Housekeeping or managing your Housekeeping? N -  Some recent data might be hidden     Cognitive Testing  Alert? Yes  Normal Appearance?Yes  Oriented to person? Yes  Place? Yes   Time? Yes  Recall of three objects?  No - 1/3  Can perform simple calculations? Yes  Displays appropriate judgment?Yes  Can read the correct time from a watch face?Yes  EOL planning: Does Patient Have a Medical Advance Directive?: No Would patient like information on creating a medical advance directive?: No - Patient declined  Review of Systems  Constitutional: Negative for malaise/fatigue and weight loss.  HENT: Negative for hearing loss and tinnitus.   Eyes: Negative for blurred vision and double vision.  Respiratory:  Negative for cough, sputum production, shortness of breath and wheezing.   Cardiovascular: Negative for chest pain, palpitations, orthopnea, claudication, leg swelling and PND.  Gastrointestinal: Negative for abdominal pain, blood in stool, constipation, diarrhea, heartburn, melena, nausea and vomiting.  Genitourinary: Negative.   Musculoskeletal: Negative for falls, joint pain and myalgias.  Skin: Negative for rash.  Neurological: Negative for dizziness, tingling, sensory change, weakness and headaches.  Endo/Heme/Allergies: Negative for polydipsia.  Psychiatric/Behavioral: Negative.  Negative for depression, memory loss, substance abuse and suicidal ideas. The patient is not nervous/anxious and does not have insomnia.   All other systems reviewed and are negative.    Objective:     Today's Vitals   04/09/17 1554  BP: 132/62  Pulse: 85  Temp: 97.7 F (36.5 C)  SpO2: 97%  Weight: 131 lb (59.4 kg)  Height: 5' 1"  (1.549 m)   Body mass index is 24.75 kg/m.  General appearance: alert, no distress, WD/WN, female HEENT: normocephalic, sclerae anicteric, TMs pearly, nares patent, no discharge or erythema, pharynx normal Oral cavity: MMM, no lesions Neck: supple, no lymphadenopathy, no thyromegaly, no masses Heart: RRR, normal S1, S2, no murmurs Lungs: CTA bilaterally, no wheezes, rhonchi, or rales Abdomen: +bs, soft, non tender, non distended, no masses, no hepatomegaly, no splenomegaly Musculoskeletal: nontender, no swelling, no obvious deformity Extremities: no edema, no cyanosis, no clubbing Pulses: 2+ symmetric, upper and lower extremities, normal cap refill Neurological: alert, oriented x 3, CN2-12 intact, strength normal upper extremities and lower extremities, sensation normal throughout, DTRs 2+ throughout, no cerebellar signs, gait slow but normal Psychiatric: normal affect, behavior normal, pleasant   Medicare Attestation I have personally reviewed: The patient's medical  and social history Their use of alcohol, tobacco or illicit drugs Their current medications and supplements The patient's functional ability including ADLs,fall risks, home safety risks, cognitive, and hearing and visual impairment Diet and physical activities Evidence for depression or mood disorders  The patient's weight, height, BMI, and visual acuity have been recorded in the chart.  I have made referrals, counseling, and provided education to the patient based on review of the above and I have provided the patient with a written personalized care plan for preventive services.     Izora Ribas, NP   04/09/2017

## 2017-04-09 ENCOUNTER — Ambulatory Visit (INDEPENDENT_AMBULATORY_CARE_PROVIDER_SITE_OTHER): Payer: Medicare Other | Admitting: Adult Health

## 2017-04-09 ENCOUNTER — Encounter: Payer: Self-pay | Admitting: Adult Health

## 2017-04-09 VITALS — BP 132/62 | HR 85 | Temp 97.7°F | Ht 61.0 in | Wt 131.0 lb

## 2017-04-09 DIAGNOSIS — Z951 Presence of aortocoronary bypass graft: Secondary | ICD-10-CM | POA: Diagnosis not present

## 2017-04-09 DIAGNOSIS — E663 Overweight: Secondary | ICD-10-CM | POA: Diagnosis not present

## 2017-04-09 DIAGNOSIS — F0390 Unspecified dementia without behavioral disturbance: Secondary | ICD-10-CM

## 2017-04-09 DIAGNOSIS — I25708 Atherosclerosis of coronary artery bypass graft(s), unspecified, with other forms of angina pectoris: Secondary | ICD-10-CM | POA: Diagnosis not present

## 2017-04-09 DIAGNOSIS — E1122 Type 2 diabetes mellitus with diabetic chronic kidney disease: Secondary | ICD-10-CM | POA: Diagnosis not present

## 2017-04-09 DIAGNOSIS — I200 Unstable angina: Secondary | ICD-10-CM

## 2017-04-09 DIAGNOSIS — K219 Gastro-esophageal reflux disease without esophagitis: Secondary | ICD-10-CM

## 2017-04-09 DIAGNOSIS — N182 Chronic kidney disease, stage 2 (mild): Secondary | ICD-10-CM | POA: Diagnosis not present

## 2017-04-09 DIAGNOSIS — E039 Hypothyroidism, unspecified: Secondary | ICD-10-CM

## 2017-04-09 DIAGNOSIS — Z23 Encounter for immunization: Secondary | ICD-10-CM

## 2017-04-09 DIAGNOSIS — E559 Vitamin D deficiency, unspecified: Secondary | ICD-10-CM

## 2017-04-09 DIAGNOSIS — I10 Essential (primary) hypertension: Secondary | ICD-10-CM

## 2017-04-09 DIAGNOSIS — Z0000 Encounter for general adult medical examination without abnormal findings: Secondary | ICD-10-CM

## 2017-04-09 DIAGNOSIS — Z0001 Encounter for general adult medical examination with abnormal findings: Secondary | ICD-10-CM | POA: Diagnosis not present

## 2017-04-09 DIAGNOSIS — Z79899 Other long term (current) drug therapy: Secondary | ICD-10-CM | POA: Diagnosis not present

## 2017-04-09 DIAGNOSIS — R6889 Other general symptoms and signs: Secondary | ICD-10-CM | POA: Diagnosis not present

## 2017-04-09 DIAGNOSIS — F03A0 Unspecified dementia, mild, without behavioral disturbance, psychotic disturbance, mood disturbance, and anxiety: Secondary | ICD-10-CM

## 2017-04-09 DIAGNOSIS — E782 Mixed hyperlipidemia: Secondary | ICD-10-CM

## 2017-04-09 MED ORDER — VERAPAMIL HCL ER 240 MG PO TBCR: 240.0000 mg | EXTENDED_RELEASE_TABLET | Freq: Every day | ORAL | 1 refills | Status: DC

## 2017-04-09 NOTE — Patient Instructions (Signed)
Vitamin D goal is between 60-80  Please make sure that you are taking your Vitamin D as directed.   It is very important as a natural anti-inflammatory   helping hair, skin, and nails, as well as reducing stroke and heart attack risk.   It helps your bones and helps with mood.  We want you on at least 5000 IU daily  It also decreases numerous cancer risks so please take it as directed.   Low Vit D is associated with a 200-300% higher risk for CANCER   and 200-300% higher risk for HEART   ATTACK  &  STROKE.    .....................................Marland Kitchen  It is also associated with higher death rate at younger ages,   autoimmune diseases like Rheumatoid arthritis, Lupus, Multiple Sclerosis.     Also many other serious conditions, like depression, Alzheimer's  Dementia, infertility, muscle aches, fatigue, fibromyalgia - just to name a few.  +++++++++++++++++++  Can get liquid vitamin D from [place] here in [place] at  [facility] alternatives [address] [place], [place] [address] Or you can try earth fare    Preventing Osteoporosis, Adult Osteoporosis is a condition that causes the bones to get weaker. With osteoporosis, the bones become thinner, and the normal spaces in bone tissue become larger. This can make the bones weak and cause them to break more easily. People who have osteoporosis are more likely to break their wrist, spine, or hip. Even a minor accident or injury can be enough to break weak bones. Osteoporosis can occur with aging. Your body constantly replaces old bone tissue with new tissue. As you get older, you may lose bone tissue more quickly, or it may be replaced more slowly. Osteoporosis is more likely to develop if you have poor nutrition or do not get enough calcium or vitamin D. Other lifestyle factors can also play a role. By making some diet and lifestyle changes, you can help to keep your bones healthy and help to prevent osteoporosis. What nutrition  changes can be made? Nutrition plays an important role in maintaining healthy, strong bones.  Make sure you get enough calcium every day from food or from calcium supplements. ? If you are age 86 or younger, aim to get 1,000 mg of calcium every day. ? If you are older than age 64, aim to get 1,200 mg of calcium every day.  Try to get enough vitamin D every day. ? If you are age 33 or younger, aim to get 600 international units (IU) every day. ? If you are older than age 62, aim to get 800 international units every day.  Follow a healthy diet. Eat plenty of foods that contain calcium and vitamin D. ? Calcium is in milk, cheese, yogurt, and other dairy products. Some fish and vegetables are also good sources of calcium. Many foods such as cereals and breads have had calcium added to them (are fortified). Check nutrition labels to see how much calcium is in a food or drink. ? Foods that contain vitamin D include milk, cereals, salmon, and tuna. Your body also makes vitamin D when you are out in the sun. Bare skin exposure to the sun on your face, arms, legs, or back for no more than 30 minutes a day, 2 times per week is more than enough. Beyond that, it is important to use sunblock to protect your skin from sunburn, which increases your risk for skin cancer.  What lifestyle changes can be made? Making changes in  your everyday life can also play an important role in preventing osteoporosis.  Stay active and get exercise every day. Ask your health care provider what types of exercise are best for you.  Do not use any products that contain nicotine or tobacco, such as cigarettes and e-cigarettes. If you need help quitting, ask your health care provider.  Limit alcohol intake to no more than 1 drink a day for nonpregnant women and 2 drinks a day for men. One drink equals 12 oz of beer, 5 oz of wine, or 1 oz of hard liquor.  Why are these changes important? Making these nutrition and lifestyle  changes can:  Help you develop and maintain healthy, strong bones.  Prevent loss of bone mass and the problems that are caused by that loss, such as broken bones and delayed healing.  Make you feel better mentally and physically.  What can happen if changes are not made? Problems that can result from osteoporosis can be very serious. These may include:  A higher risk of broken bones that are painful and do not heal well.  Physical malformations, such as a collapsed spine or a hunched back.  Problems with movement.  Where to find support: If you need help making changes to prevent osteoporosis, talk with your health care provider. You can ask for a referral to a diet and nutrition specialist (dietitian) and a physical therapist. Where to find more information: Learn more about osteoporosis from:  NIH Osteoporosis and Related [place]: www.niams.GolfingGoddess.com.br  U.S. Office on Women's Health: SouvenirBaseball.es.html  National Osteoporosis Foundation: ProfilePeek.ch  Summary  Osteoporosis is a condition that causes weak bones that are more likely to break.  Eating a healthy diet and making sure you get enough calcium and vitamin D can help prevent osteoporosis.  Other ways to reduce your risk of osteoporosis include getting regular exercise and avoiding alcohol and products that contain nicotine or tobacco. This information is not intended to replace advice given to you by your health care provider. Make sure you discuss any questions you have with your health care provider. Document Released: 01/08/2015 Document Revised: 09/04/2015 Document Reviewed: 09/04/2015 Elsevier Interactive Patient Education  [employer].

## 2017-04-10 LAB — BASIC METABOLIC PANEL WITH GFR
BUN: 17 mg/dL (ref 7–25)
CO2: 25 mmol/L (ref 20–32)
Calcium: 9.3 mg/dL (ref 8.6–10.4)
Chloride: 105 mmol/L (ref 98–110)
Creat: 0.82 mg/dL (ref 0.60–0.88)
GFR, Est African American: 77 mL/min/{1.73_m2} (ref >=60)
GFR, Est Non African American: 66 mL/min/{1.73_m2} (ref >=60)
Glucose, Bld: 152 mg/dL — ABNORMAL HIGH (ref 65–99)
Potassium: 4.3 mmol/L (ref 3.5–5.3)
Sodium: 138 mmol/L (ref 135–146)

## 2017-04-10 LAB — CBC WITH DIFFERENTIAL/PLATELET
Basophils Absolute: 17 cells/uL (ref 0–200)
Basophils Relative: 0.3 %
Eosinophils Absolute: 11 cells/uL — ABNORMAL LOW (ref 15–500)
Eosinophils Relative: 0.2 %
HCT: 32.5 % — ABNORMAL LOW (ref 35.0–45.0)
Hemoglobin: 11.2 g/dL — ABNORMAL LOW (ref 11.7–15.5)
Lymphs Abs: 1927 cells/uL (ref 850–3900)
MCH: 29.6 pg (ref 27.0–33.0)
MCHC: 34.5 g/dL (ref 32.0–36.0)
MCV: 85.8 fL (ref 80.0–100.0)
MPV: 10.5 fL (ref 7.5–12.5)
Monocytes Relative: 16.6 %
Neutro Abs: 2799 cells/uL (ref 1500–7800)
Neutrophils Relative %: 49.1 %
Platelets: 216 10*3/uL (ref 140–400)
RBC: 3.79 10*6/uL — ABNORMAL LOW (ref 3.80–5.10)
RDW: 12.6 % (ref 11.0–15.0)
Total Lymphocyte: 33.8 %
WBC mixed population: 946 cells/uL (ref 200–950)
WBC: 5.7 10*3/uL (ref 3.8–10.8)

## 2017-04-10 LAB — HEPATIC FUNCTION PANEL
AG Ratio: 1.4 (calc) (ref 1.0–2.5)
ALT: 9 U/L (ref 6–29)
AST: 11 U/L (ref 10–35)
Albumin: 4.4 g/dL (ref 3.6–5.1)
Alkaline phosphatase (APISO): 45 U/L (ref 33–130)
Bilirubin, Direct: 0.1 mg/dL (ref 0.0–0.2)
Globulin: 3.1 g/dL (calc) (ref 1.9–3.7)
Indirect Bilirubin: 0.3 mg/dL (calc) (ref 0.2–1.2)
Total Bilirubin: 0.4 mg/dL (ref 0.2–1.2)
Total Protein: 7.5 g/dL (ref 6.1–8.1)

## 2017-04-10 LAB — HEMOGLOBIN A1C
Hgb A1c MFr Bld: 6.5 % of total Hgb — ABNORMAL HIGH (ref <5.7)
Mean Plasma Glucose: 140 (calc)
eAG (mmol/L): 7.7 (calc)

## 2017-04-10 LAB — LIPID PANEL
Cholesterol: 134 mg/dL (ref <200)
HDL: 53 mg/dL (ref >50)
LDL Cholesterol (Calc): 67 mg/dL (calc)
Non-HDL Cholesterol (Calc): 81 mg/dL (calc) (ref <130)
Total CHOL/HDL Ratio: 2.5 (calc) (ref <5.0)
Triglycerides: 63 mg/dL (ref <150)

## 2017-04-10 LAB — TSH: TSH: 0.82 mIU/L (ref 0.40–4.50)

## 2017-05-01 ENCOUNTER — Telehealth: Payer: Self-pay | Admitting: *Deleted

## 2017-05-01 NOTE — Telephone Encounter (Signed)
Patient's son called and reported the patient is having pain behind her left knee.  An ppointmen was offered for tomorrow, but was declined and an appointment was scheduled for 05/05/2017. The son was advised to call in the Am if the pain worsened.

## 2017-05-04 NOTE — Progress Notes (Deleted)
Knee only vs medicare? Just did early april

## 2017-05-05 ENCOUNTER — Ambulatory Visit: Payer: Self-pay | Admitting: Adult Health

## 2017-06-17 ENCOUNTER — Other Ambulatory Visit: Payer: Self-pay | Admitting: Internal Medicine

## 2017-06-17 DIAGNOSIS — G4700 Insomnia, unspecified: Secondary | ICD-10-CM

## 2017-07-14 ENCOUNTER — Encounter: Payer: Self-pay | Admitting: Internal Medicine

## 2017-07-14 ENCOUNTER — Ambulatory Visit (INDEPENDENT_AMBULATORY_CARE_PROVIDER_SITE_OTHER): Payer: Medicare Other | Admitting: Internal Medicine

## 2017-07-14 VITALS — BP 128/68 | HR 68 | Temp 97.4°F | Resp 16 | Ht 61.0 in | Wt 144.6 lb

## 2017-07-14 DIAGNOSIS — I10 Essential (primary) hypertension: Secondary | ICD-10-CM | POA: Diagnosis not present

## 2017-07-14 DIAGNOSIS — E782 Mixed hyperlipidemia: Secondary | ICD-10-CM

## 2017-07-14 DIAGNOSIS — E559 Vitamin D deficiency, unspecified: Secondary | ICD-10-CM

## 2017-07-14 DIAGNOSIS — L570 Actinic keratosis: Secondary | ICD-10-CM

## 2017-07-14 DIAGNOSIS — E1122 Type 2 diabetes mellitus with diabetic chronic kidney disease: Secondary | ICD-10-CM

## 2017-07-14 DIAGNOSIS — N182 Chronic kidney disease, stage 2 (mild): Secondary | ICD-10-CM

## 2017-07-14 DIAGNOSIS — Z79899 Other long term (current) drug therapy: Secondary | ICD-10-CM

## 2017-07-14 DIAGNOSIS — E039 Hypothyroidism, unspecified: Secondary | ICD-10-CM

## 2017-07-14 DIAGNOSIS — I25708 Atherosclerosis of coronary artery bypass graft(s), unspecified, with other forms of angina pectoris: Secondary | ICD-10-CM

## 2017-07-14 DIAGNOSIS — Z951 Presence of aortocoronary bypass graft: Secondary | ICD-10-CM

## 2017-07-14 NOTE — Progress Notes (Signed)
This very nice 84 y.o. MWF presents for 6 month follow up with HTN, ASCAD, HLD, Pre-Diabetes and Vitamin D Deficiency. Patient has mild Dementia followed also by Dr Delice Lesch and her son is supervising her meds.      Patient is treated for HTN (1991) & BP has been controlled at home. Today's BP is at goal - 128/68.  In Dec 2017, She presented with an acute MI & underwent emergent CABG x 4V. Patient has had no complaints of any cardiac type chest pain, palpitations, dyspnea / orthopnea / PND, dizziness, claudication, or dependent edema.     Hyperlipidemia is controlled with diet & meds. Patient denies myalgias or other med SE's. Last Lipids were  Lab Results  Component Value Date   CHOL 134 04/09/2017   HDL 53 04/09/2017   LDLCALC 67 04/09/2017   TRIG 63 04/09/2017   CHOLHDL 2.5 04/09/2017      Also, the patient has history of T2_NIDDM (1996) w/CKD2 and has had no symptoms of reactive hypoglycemia, diabetic polys, paresthesias or visual blurring.  Last A1c was not at goal:  Lab Results  Component Value Date   HGBA1C 6.5 (H) 04/09/2017      Further, the patient also has history of Vitamin D Deficiency ("27"/2008)  and does not supplement vitamin D as recommended. Last vitamin D was still low: Lab Results  Component Value Date   VD25OH 35 01/06/2017   Current Outpatient Medications on File Prior to Visit  Medication Sig  . aspirin EC 325 MG EC tablet Take 1 tablet (325 mg total) by mouth daily.  Marland Kitchen atorvastatin (LIPITOR) 80 MG tablet TAKE 1 TABLET DAILY FOR CHOLESTEROL  . blood glucose meter kit and supplies KIT Dispense based on patient and insurance preference. Check blood sugar 1 time daily-DX-E11.22.  . metFORMIN (GLUCOPHAGE-XR) 500 MG 24 hr tablet TAKE 2 TABLETS BY MOUTH TWICE A DAY  . mirtazapine (REMERON) 15 MG tablet TAKE 1 TABLET ONE HOUR BEFORE SLEEP  . verapamil (CALAN-SR) 240 MG CR tablet Take 1 tablet (240 mg total) by mouth at bedtime.  . nitroGLYCERIN (NITROSTAT) 0.4 MG  SL tablet Dissolve 1 tablet under tongue every 3 to 5 minutes if needed for ChestPain   No current facility-administered medications on file prior to visit.    Allergies  Allergen Reactions  . Bee Venom Anaphylaxis and Other (See Comments)    Eyes swell, cannot breathe  . Persantine [Dipyridamole] Other (See Comments)    "went crazy"  . Actos [Pioglitazone] Other (See Comments)    UNSPECIFIED REACTION    PMHx:   Past Medical History:  Diagnosis Date  . Anemia   . Anginal pain ([place])   . Cataract   . Colon polyp 2009    TUBULAR ADENOMA  . Coronary artery disease   . Depression   . Diabetes ([place])   . Diverticulosis of colon (without mention of hemorrhage) 2009  . Esophagitis 2009  . Gastritis 2009  . GERD (gastroesophageal reflux disease) 2009  . Hiatal hernia 2009  . Hyperlipemia   . Hypertension   . Thyroid disease    hypothyroid   Immunization History  Administered Date(s) Administered  . Influenza Inj Mdck Quad Pf 10/31/2016  . Influenza Split 10/03/2014  . Influenza,inj,quad, With Preservative 10/31/2015  . Influenza-Unspecified 11/18/2012  . Pneumococcal Conjugate-13 11/04/2013  . Pneumococcal Polysaccharide-23 07/25/2010  . Pneumococcal-Unspecified 02/01/2001  . Td 02/01/2006, 04/09/2017   Past Surgical History:  Procedure Laterality Date  .  BREAST BIOPSY Left 1999   neg  . CARDIAC CATHETERIZATION N/A 11/15/2015   Procedure: Left Heart Cath and Coronary Angiography;  Surgeon: Troy Sine, MD;  Location: [facility];  Service: Cardiovascular;  Laterality: N/A;  . COLONOSCOPY  05/2012   Dr. Sharlett Iles  . CORONARY ARTERY BYPASS GRAFT  11/16/2015   Procedure: OFF PUMP CORONARY ARTERY BYPASS GRAFTING (CABG) x FOUR, USING LEFT MAMMARY ARTERY  AND ENDOSCOPICALLY HARVESTED RIGHT GREATER SAPHENOUS VEIN.;  Surgeon: Melrose Nakayama, MD;  Location: [facility];  Service: Open Heart Surgery;;  . DILATION AND CURETTAGE OF UTERUS  1984  . TEE WITHOUT CARDIOVERSION  N/A 11/16/2015   Procedure: TRANSESOPHAGEAL ECHOCARDIOGRAM (TEE);  Surgeon: Melrose Nakayama, MD;  Location: [facility];  Service: Open Heart Surgery;  Laterality: N/A;   FHx:    Reviewed / unchanged  SHx:    Reviewed / unchanged   Systems Review:  Constitutional: Denies fever, chills, wt changes, headaches, insomnia, fatigue, night sweats, change in appetite. Eyes: Denies redness, blurred vision, diplopia, discharge, itchy, watery eyes.  ENT: Denies discharge, congestion, post nasal drip, epistaxis, sore throat, earache, hearing loss, dental pain, tinnitus, vertigo, sinus pain, snoring.  CV: Denies chest pain, palpitations, irregular heartbeat, syncope, dyspnea, diaphoresis, orthopnea, PND, claudication or edema. Respiratory: denies cough, dyspnea, DOE, pleurisy, hoarseness, laryngitis, wheezing.  Gastrointestinal: Denies dysphagia, odynophagia, heartburn, reflux, water brash, abdominal pain or cramps, nausea, vomiting, bloating, diarrhea, constipation, hematemesis, melena, hematochezia  or hemorrhoids. Genitourinary: Denies dysuria, frequency, urgency, nocturia, hesitancy, discharge, hematuria or flank pain. Musculoskeletal: Denies arthralgias, myalgias, stiffness, jt. swelling, pain, limping or strain/sprain.  Skin: Denies pruritus, rash, hives, warts, acne, eczema or change in skin lesion(s). Neuro: No weakness, tremor, incoordination, spasms, paresthesia or pain. Psychiatric: Denies confusion, memory loss or sensory loss. Endo: Denies change in weight, skin or hair change.  Heme/Lymph: No excessive bleeding, bruising or enlarged lymph nodes.  Physical Exam  BP 128/68   Pulse 68   Temp (!) 97.4 F (36.3 C)   Resp 16   Ht _0  (1.549 m)   Wt 144 lb 9.6 oz (65.6 kg)   LMP  (LMP Unknown)   BMI 27.32 kg/m   Appears  well nourished, well groomed  and in no distress.  Eyes: PERRLA, EOMs, conjunctiva no swelling or erythema. Sinuses: No frontal/maxillary tenderness ENT/Mouth:  EAC's clear, TM's nl w/o erythema, bulging. Nares clear w/o erythema, swelling, exudates. Oropharynx clear without erythema or exudates. Oral hygiene is good. Tongue normal, non obstructing. Hearing intact.  Neck: Supple. Thyroid not palpable. Car 2+/2+ without bruits, nodes or JVD. Chest: Respirations nl with BS clear & equal w/o rales, rhonchi, wheezing or stridor.  Cor: Heart sounds normal w/ regular rate and rhythm without sig. murmurs, gallops, clicks or rubs. Peripheral pulses normal and equal  without edema.  Abdomen: Soft & bowel sounds normal. Non-tender w/o guarding, rebound, hernias, masses or organomegaly.  Lymphatics: Unremarkable.  Musculoskeletal: Full ROM all peripheral extremities, joint stability, 5/5 strength and normal gait.  Skin: Warm, dry without exposed rashes, lesions or ecchymosis apparent.  Neuro: Cranial nerves intact, reflexes equal bilaterally. Sensory-motor testing grossly intact. Tendon reflexes grossly intact.  Pysch: Alert & oriented x 3.  Insight and judgement nl & appropriate. No ideations.  Assessment and Plan:  1. Essential hypertension  - Continue medication, monitor blood pressure at home.  - Continue DASH diet.  Reminder to go to the ER if any CP,  SOB, nausea, dizziness, severe HA, changes vision/speech.  -  CBC with Differential/Platelet - COMPLETE METABOLIC PANEL WITH GFR - Magnesium - TSH  2. Hyperlipidemia, mixed  - Continue diet/meds, exercise,& lifestyle modifications.  - Continue monitor periodic cholesterol/liver & renal functions   - Lipid panel - TSH  3. Type 2 diabetes mellitus with stage 2 chronic kidney disease, without long-term current use of insulin (HCC)  - Continue diet, exercise, lifestyle modifications.  - Monitor appropriate labs.  - Hemoglobin A1c - Insulin, random  4. Vitamin D deficiency  - Continue supplementation.  - VITAMIN D 25 Hydroxyl  5. Coronary artery disease involving coronary bypass graft with  other forms of angina pectoris, unspecified whether native or transplanted heart (HCC)  - Lipid panel  6. S/P CABG x 4  - Lipid panel  7. Hypothyroidism  - TSH  8. Medication management  - CBC with Differential/Platelet - COMPLETE METABOLIC PANEL WITH GFR - Magnesium - Lipid panel - TSH - Hemoglobin A1c - Insulin, random - VITAMIN D 25 Hydroxyl      Discussed  regular exercise, BP monitoring, weight control to achieve/maintain BMI less than 25 and discussed med and SE's. Recommended labs to assess and monitor clinical status with further disposition pending results of labs. Over 30 minutes of exam, counseling, chart review was performed.

## 2017-07-14 NOTE — Patient Instructions (Signed)

## 2017-07-15 ENCOUNTER — Other Ambulatory Visit: Payer: Self-pay | Admitting: Internal Medicine

## 2017-07-15 LAB — CBC WITH DIFFERENTIAL/PLATELET
Basophils Absolute: 9 cells/uL (ref 0–200)
Basophils Relative: 0.2 %
Eosinophils Absolute: 9 cells/uL — ABNORMAL LOW (ref 15–500)
Eosinophils Relative: 0.2 %
HCT: 34.9 % — ABNORMAL LOW (ref 35.0–45.0)
Hemoglobin: 12 g/dL (ref 11.7–15.5)
Lymphs Abs: 2138 cells/uL (ref 850–3900)
MCH: 29.6 pg (ref 27.0–33.0)
MCHC: 34.4 g/dL (ref 32.0–36.0)
MCV: 86 fL (ref 80.0–100.0)
MPV: 10.1 fL (ref 7.5–12.5)
Monocytes Relative: 19.5 %
Neutro Abs: 1467 cells/uL — ABNORMAL LOW (ref 1500–7800)
Neutrophils Relative %: 32.6 %
Platelets: 237 10*3/uL (ref 140–400)
RBC: 4.06 10*6/uL (ref 3.80–5.10)
RDW: 13.2 % (ref 11.0–15.0)
Total Lymphocyte: 47.5 %
WBC mixed population: 878 cells/uL (ref 200–950)
WBC: 4.5 10*3/uL (ref 3.8–10.8)

## 2017-07-15 LAB — COMPLETE METABOLIC PANEL WITH GFR
AG Ratio: 1.2 (calc) (ref 1.0–2.5)
ALT: 5 U/L — ABNORMAL LOW (ref 6–29)
AST: 9 U/L — ABNORMAL LOW (ref 10–35)
Albumin: 4.2 g/dL (ref 3.6–5.1)
Alkaline phosphatase (APISO): 46 U/L (ref 33–130)
BUN: 12 mg/dL (ref 7–25)
CO2: 21 mmol/L (ref 20–32)
Calcium: 9.2 mg/dL (ref 8.6–10.4)
Chloride: 106 mmol/L (ref 98–110)
Creat: 0.79 mg/dL (ref 0.60–0.88)
GFR, Est African American: 80 mL/min/{1.73_m2} (ref >=60)
GFR, Est Non African American: 69 mL/min/{1.73_m2} (ref >=60)
Globulin: 3.5 g/dL (calc) (ref 1.9–3.7)
Glucose, Bld: 149 mg/dL — ABNORMAL HIGH (ref 65–99)
Potassium: 4.3 mmol/L (ref 3.5–5.3)
Sodium: 139 mmol/L (ref 135–146)
Total Bilirubin: 0.5 mg/dL (ref 0.2–1.2)
Total Protein: 7.7 g/dL (ref 6.1–8.1)

## 2017-07-15 LAB — VITAMIN D 25 HYDROXY (VIT D DEFICIENCY, FRACTURES): Vit D, 25-Hydroxy: 27 ng/mL — ABNORMAL LOW (ref 30–100)

## 2017-07-15 LAB — MAGNESIUM: Magnesium: 1.7 mg/dL (ref 1.5–2.5)

## 2017-07-15 LAB — INSULIN, RANDOM: Insulin: 10.7 u[IU]/mL (ref 2.0–19.6)

## 2017-07-15 LAB — LIPID PANEL
Cholesterol: 228 mg/dL — ABNORMAL HIGH (ref <200)
HDL: 50 mg/dL — ABNORMAL LOW (ref >50)
LDL Cholesterol (Calc): 156 mg/dL (calc) — ABNORMAL HIGH
Non-HDL Cholesterol (Calc): 178 mg/dL (calc) — ABNORMAL HIGH (ref <130)
Total CHOL/HDL Ratio: 4.6 (calc) (ref <5.0)
Triglycerides: 103 mg/dL (ref <150)

## 2017-07-15 LAB — HEMOGLOBIN A1C
Hgb A1c MFr Bld: 6.9 % of total Hgb — ABNORMAL HIGH (ref <5.7)
Mean Plasma Glucose: 151 (calc)
eAG (mmol/L): 8.4 (calc)

## 2017-07-15 LAB — TSH: TSH: 0.7 mIU/L (ref 0.40–4.50)

## 2017-08-04 ENCOUNTER — Ambulatory Visit: Payer: Medicare Other | Admitting: Neurology

## 2017-08-22 ENCOUNTER — Other Ambulatory Visit: Payer: Self-pay | Admitting: Internal Medicine

## 2017-09-04 ENCOUNTER — Other Ambulatory Visit: Payer: Self-pay | Admitting: Physician Assistant

## 2017-09-10 ENCOUNTER — Other Ambulatory Visit: Payer: Self-pay | Admitting: Internal Medicine

## 2017-10-22 ENCOUNTER — Other Ambulatory Visit: Payer: Self-pay | Admitting: Adult Health

## 2017-10-23 NOTE — Progress Notes (Signed)
Patient ID: AMISADAI WOODFORD, female   DOB: [birth year], 84 y.o.   MRN: [id number]  Assessment and Plan:   Essential hypertension - continue medications, DASH diet, exercise and monitor at home. Call if greater than 130/80.  -     CBC with Differential/Platelet -     COMPLETE METABOLIC PANEL WITH GFR -     TSH  CKD stage 2 due to type 2 diabetes mellitus (HCC) Increase fluids, avoid NSAIDS, monitor sugars, will monitor -     COMPLETE METABOLIC PANEL WITH GFR -     Hemoglobin A1c  Acquired hypothyroidism Hypothyroidism-check TSH level, continue medications the same, reminded to take on an empty stomach 30-2mns before food.  -     TSH  Type 2 diabetes mellitus with stage 2 chronic kidney disease, without long-term current use of insulin (HCC) Discussed general issues about diabetes pathophysiology and management., Educational material distributed., Suggested low cholesterol diet., Encouraged aerobic exercise., Discussed foot care., Reminded to get yearly retinal exam.  Mild [place] Continue follow up neuro Information printed out for son  Hyperlipidemia Check to see if still on medication -     Lipid panel  Constipation, unspecified constipation type -     polyethylene glycol (MIRALAX / GLYCOLAX) packet; Take 17 g by mouth daily. Soft AB, doing well.    Continue diet and meds as discussed. Further disposition pending results of labs. Future Appointments  Date Time Provider [place] 02/02/2018  2:00 PM MUnk Pinto MD [facility] None  04/13/2018  3:45 PM CLiane Comber NP [facility] None    HPI 84y.o. female  presents for 3 month follow up with hypertension, hyperlipidemia, prediabetes and vitamin D.   Her blood pressure has been controlled at home, today their BP is BP: 120/76.   She does not workout. She denies chest pain, shortness of breath, dizziness. She has history Dec 2017, underwent 4V CABG, she has history of angina but is on imdur, denies pain.   Son does medications for her, son's wife brought her today, she does not drive. She lives with her husband at home.  She has dementia and follows with Dr. ADelice Lesch   She is on cholesterol medication and denies myalgias. Her cholesterol was at goal. The cholesterol last visit was:   Lab Results  Component Value Date   CHOL 228 (H) 07/14/2017   HDL 50 (L) 07/14/2017   LDLCALC 156 (H) 07/14/2017   TRIG 103 07/14/2017   CHOLHDL 4.6 07/14/2017     She has not been working on diet and exercise for diabetes, she is on metofmrin, and denies foot ulcerations, hyperglycemia, hypoglycemia , increased appetite, nausea, paresthesia of the feet, polydipsia, polyuria, visual disturbances, vomiting and weight loss. Last A1C in the office was:  Lab Results  Component Value Date   HGBA1C 6.9 (H) 07/14/2017    Patient is on Vitamin D supplement.  Lab Results  Component Value Date   VD25OH 27 (L) 07/14/2017     BMI is Body mass index is 25.92 kg/m., she is working on diet and exercise. Wt Readings from Last 3 Encounters:  10/28/17 137 lb 3.2 oz (62.2 kg)  07/14/17 144 lb 9.6 oz (65.6 kg)  04/09/17 131 lb (59.4 kg)    Current Medications:  Current Outpatient Medications on File Prior to Visit  Medication Sig Dispense Refill  . aspirin EC 325 MG EC tablet Take 1 tablet (325 mg total) by mouth daily. 30 tablet 0  . atorvastatin (LIPITOR)  80 MG tablet TAKE 1 TABLET DAILY FOR CHOLESTEROL 90 tablet 4  . blood glucose meter kit and supplies KIT Dispense based on patient and insurance preference. Check blood sugar 1 time daily-DX-E11.22. 1 each 0  . isosorbide mononitrate (IMDUR) 30 MG 24 hr tablet TAKE 1 TABLET DAILY 90 tablet 4  . lisinopril (PRINIVIL,ZESTRIL) 20 MG tablet TAKE 1 TABLET DAILY FOR BLOOD PRESSURE AND DIABETIC KIDNEY PROTECTION 90 tablet 4  . metFORMIN (GLUCOPHAGE-XR) 500 MG 24 hr tablet TAKE 2 TABLETS BY MOUTH TWICE A DAY 120 tablet 1  . mirtazapine (REMERON) 15 MG tablet TAKE 1 TABLET  ONE HOUR BEFORE SLEEP 90 tablet 1  . nitroGLYCERIN (NITROSTAT) 0.4 MG SL tablet Dissolve 1 tablet under tongue every 3 to 5 minutes if needed for ChestPain 50 tablet 12  . verapamil (CALAN-SR) 240 MG CR tablet TAKE 1 TABLET AT BEDTIME 90 tablet 1   No current facility-administered medications on file prior to visit.     Medical History:  Past Medical History:  Diagnosis Date  . Anemia   . Anginal pain ([place])   . Cataract   . Colon polyp 2009    TUBULAR ADENOMA  . Coronary artery disease   . Depression   . Diabetes ([place])   . Diverticulosis of colon (without mention of hemorrhage) 2009  . Esophagitis 2009  . Gastritis 2009  . GERD (gastroesophageal reflux disease) 2009  . Hiatal hernia 2009  . Hyperlipemia   . Hypertension   . Thyroid disease    hypothyroid    Allergies:  Allergies  Allergen Reactions  . Bee Venom Anaphylaxis and Other (See Comments)    Eyes swell, cannot breathe  . Persantine [Dipyridamole] Other (See Comments)    "went crazy"  . Actos [Pioglitazone] Other (See Comments)    UNSPECIFIED REACTION      Review of Systems:  Review of Systems  Constitutional: Negative for chills, fever and malaise/fatigue.  HENT: Negative for congestion, ear pain and sore throat.   Respiratory: Negative for cough, shortness of breath and wheezing.   Cardiovascular: Negative for chest pain, palpitations and leg swelling.  Gastrointestinal: Positive for constipation. Negative for abdominal pain, blood in stool, diarrhea, heartburn and melena.  Genitourinary: Negative.   Skin: Negative.   Neurological: Negative for dizziness, sensory change, loss of consciousness and headaches.  Psychiatric/Behavioral: Positive for memory loss. Negative for depression. The patient is not nervous/anxious and does not have insomnia.     Family history- Review and unchanged  Social history- Review and unchanged  Physical Exam: BP 120/76   Pulse 94   Temp 98.4 F (36.9 C)   Resp 16    Ht _0  (1.549 m)   Wt 137 lb 3.2 oz (62.2 kg)   LMP  (LMP Unknown)   SpO2 98%   BMI 25.92 kg/m  Wt Readings from Last 3 Encounters:  10/28/17 137 lb 3.2 oz (62.2 kg)  07/14/17 144 lb 9.6 oz (65.6 kg)  04/09/17 131 lb (59.4 kg)    General Appearance: Well nourished well developed, in no apparent distress. Eyes: PERRLA, EOMs, conjunctiva no swelling or erythema ENT/Mouth: Ear canals normal without obstruction, swelling, erythma, discharge.  TMs normal bilaterally.  Oropharynx moist, clear, without exudate, or postoropharyngeal swelling. Neck: Supple, thyroid normal,no cervical adenopathy  Respiratory: Respiratory effort normal, Breath sounds clear A&P without rhonchi, wheeze, or rale.  No retractions, no accessory usage. Cardio: RRR with no MRGs. Brisk peripheral pulses without edema.  Abdomen: Soft, + BS,  Non tender, no guarding, rebound, hernias, masses. Musculoskeletal: Full ROM, 5/5 strength, Normal gait Skin: Warm, dry without rashes, lesions, ecchymosis.  Neuro: Awake and oriented X 3, Cranial nerves intact. Normal muscle tone, no cerebellar symptoms. Psych: Normal affect, Insight and Judgment appropriate.    Vicie Mutters, PA-C 2:41 PM [facility]

## 2017-10-28 ENCOUNTER — Encounter: Payer: Self-pay | Admitting: Physician Assistant

## 2017-10-28 ENCOUNTER — Ambulatory Visit (INDEPENDENT_AMBULATORY_CARE_PROVIDER_SITE_OTHER): Payer: Medicare Other | Admitting: Physician Assistant

## 2017-10-28 VITALS — BP 120/76 | HR 94 | Temp 98.4°F | Resp 16 | Ht 61.0 in | Wt 137.2 lb

## 2017-10-28 DIAGNOSIS — F03A0 Unspecified dementia, mild, without behavioral disturbance, psychotic disturbance, mood disturbance, and anxiety: Secondary | ICD-10-CM

## 2017-10-28 DIAGNOSIS — N182 Chronic kidney disease, stage 2 (mild): Secondary | ICD-10-CM

## 2017-10-28 DIAGNOSIS — F0390 Unspecified dementia without behavioral disturbance: Secondary | ICD-10-CM

## 2017-10-28 DIAGNOSIS — E039 Hypothyroidism, unspecified: Secondary | ICD-10-CM | POA: Diagnosis not present

## 2017-10-28 DIAGNOSIS — E782 Mixed hyperlipidemia: Secondary | ICD-10-CM

## 2017-10-28 DIAGNOSIS — E1122 Type 2 diabetes mellitus with diabetic chronic kidney disease: Secondary | ICD-10-CM

## 2017-10-28 DIAGNOSIS — Z79899 Other long term (current) drug therapy: Secondary | ICD-10-CM

## 2017-10-28 DIAGNOSIS — I10 Essential (primary) hypertension: Secondary | ICD-10-CM | POA: Diagnosis not present

## 2017-10-28 DIAGNOSIS — K5900 Constipation, unspecified: Secondary | ICD-10-CM

## 2017-10-28 MED ORDER — POLYETHYLENE GLYCOL 3350 17 G PO PACK: 17.0000 g | PACK | Freq: Every day | ORAL | 2 refills | Status: DC

## 2017-10-28 NOTE — Patient Instructions (Signed)
Last time your cholesterol was not at goal Please see if you are still taking the atorvastatin or cholesterol medication  Your LDL is not in range or at goal, goal is less than 70.  Your LDL is the bad cholesterol that can lead to heart attack and stroke. To lower your number you can decrease your fatty foods, red meat, cheese, milk and increase fiber like whole grains and veggies. You can also add a fiber supplement like Citracel or Benefiber, these do not cause gas and bloating and are safe to use. Since you have risk factors that make [place] want your number below 70, we need to adjust or add medications to get your number below goal.   I will send in miralax for you to take daily for your constipation Increase your water   Constipation, Adult Constipation is when a person has fewer bowel movements in a week than normal, has difficulty having a bowel movement, or has stools that are dry, hard, or larger than normal. Constipation may be caused by an underlying condition. It may become worse with age if a person takes certain medicines and does not take in enough fluids. Follow these instructions at home: Eating and drinking   Eat foods that have a lot of fiber, such as fresh fruits and vegetables, whole grains, and beans.  Limit foods that are high in fat, low in fiber, or overly processed, such as french fries, hamburgers, cookies, candies, and soda.  Drink enough fluid to keep your urine clear or pale yellow. General instructions  Exercise regularly or as told by your health care provider.  Go to the restroom when you have the urge to go. Do not hold it in.  Take over-the-counter and prescription medicines only as told by your health care provider. These include any fiber supplements.  Practice pelvic floor retraining exercises, such as deep breathing while relaxing the lower abdomen and pelvic floor relaxation during bowel movements.  Watch your condition for any changes.  Keep all  follow-up visits as told by your health care provider. This is important. Contact a health care provider if:  You have pain that gets worse.  You have a fever.  You do not have a bowel movement after 4 days.  You vomit.  You are not hungry.  You lose weight.  You are bleeding from the anus.  You have thin, pencil-like stools. Get help right away if:  You have a fever and your symptoms suddenly get worse.  You leak stool or have blood in your stool.  Your abdomen is bloated.  You have severe pain in your abdomen.  You feel dizzy or you faint. This information is not intended to replace advice given to you by your health care provider. Make sure you discuss any questions you have with your health care provider. Document Released: 09/22/2003 Document Revised: 07/14/2015 Document Reviewed: 06/14/2015 Elsevier Interactive Patient Education  2018 [employer].

## 2017-10-29 LAB — COMPLETE METABOLIC PANEL WITH GFR
AG Ratio: 1.3 (calc) (ref 1.0–2.5)
ALT: 5 U/L — ABNORMAL LOW (ref 6–29)
AST: 12 U/L (ref 10–35)
Albumin: 4.3 g/dL (ref 3.6–5.1)
Alkaline phosphatase (APISO): 46 U/L (ref 33–130)
BUN/Creatinine Ratio: 8 (calc) (ref 6–22)
BUN: 6 mg/dL — ABNORMAL LOW (ref 7–25)
CO2: 28 mmol/L (ref 20–32)
Calcium: 9.1 mg/dL (ref 8.6–10.4)
Chloride: 103 mmol/L (ref 98–110)
Creat: 0.74 mg/dL (ref 0.60–0.88)
GFR, Est African American: 86 mL/min/{1.73_m2} (ref >=60)
GFR, Est Non African American: 74 mL/min/{1.73_m2} (ref >=60)
Globulin: 3.2 g/dL (calc) (ref 1.9–3.7)
Glucose, Bld: 126 mg/dL — ABNORMAL HIGH (ref 65–99)
Potassium: 4.6 mmol/L (ref 3.5–5.3)
Sodium: 139 mmol/L (ref 135–146)
Total Bilirubin: 0.6 mg/dL (ref 0.2–1.2)
Total Protein: 7.5 g/dL (ref 6.1–8.1)

## 2017-10-29 LAB — CBC WITH DIFFERENTIAL/PLATELET
Basophils Absolute: 21 cells/uL (ref 0–200)
Basophils Relative: 0.5 %
Eosinophils Absolute: 0 cells/uL — ABNORMAL LOW (ref 15–500)
Eosinophils Relative: 0 %
HCT: 34.2 % — ABNORMAL LOW (ref 35.0–45.0)
Hemoglobin: 11.7 g/dL (ref 11.7–15.5)
Lymphs Abs: 1806 cells/uL (ref 850–3900)
MCH: 29.5 pg (ref 27.0–33.0)
MCHC: 34.2 g/dL (ref 32.0–36.0)
MCV: 86.4 fL (ref 80.0–100.0)
MPV: 9.9 fL (ref 7.5–12.5)
Monocytes Relative: 17.3 %
Neutro Abs: 1646 cells/uL (ref 1500–7800)
Neutrophils Relative %: 39.2 %
Platelets: 214 10*3/uL (ref 140–400)
RBC: 3.96 10*6/uL (ref 3.80–5.10)
RDW: 13.5 % (ref 11.0–15.0)
Total Lymphocyte: 43 %
WBC mixed population: 727 cells/uL (ref 200–950)
WBC: 4.2 10*3/uL (ref 3.8–10.8)

## 2017-10-29 LAB — HEMOGLOBIN A1C
Hgb A1c MFr Bld: 6.9 % of total Hgb — ABNORMAL HIGH (ref <5.7)
Mean Plasma Glucose: 151 (calc)
eAG (mmol/L): 8.4 (calc)

## 2017-10-29 LAB — HM DIABETES EYE EXAM

## 2017-10-29 LAB — LIPID PANEL
Cholesterol: 128 mg/dL (ref <200)
HDL: 46 mg/dL — ABNORMAL LOW (ref >50)
LDL Cholesterol (Calc): 66 mg/dL (calc)
Non-HDL Cholesterol (Calc): 82 mg/dL (calc) (ref <130)
Total CHOL/HDL Ratio: 2.8 (calc) (ref <5.0)
Triglycerides: 76 mg/dL (ref <150)

## 2017-10-29 LAB — TSH: TSH: 0.49 mIU/L (ref 0.40–4.50)

## 2017-12-08 ENCOUNTER — Encounter: Payer: Self-pay | Admitting: *Deleted

## 2017-12-14 ENCOUNTER — Other Ambulatory Visit: Payer: Self-pay | Admitting: Internal Medicine

## 2017-12-14 DIAGNOSIS — G4700 Insomnia, unspecified: Secondary | ICD-10-CM

## 2018-02-02 ENCOUNTER — Encounter: Payer: Self-pay | Admitting: Internal Medicine

## 2018-02-14 IMAGING — CR DG CHEST 1V PORT
1 series · 1 of 1 positions shown · non-contrast
Comparison: November 17, 2015

CLINICAL DATA: Status post CABG.

EXAM:
PORTABLE CHEST 1 VIEW

[AP]
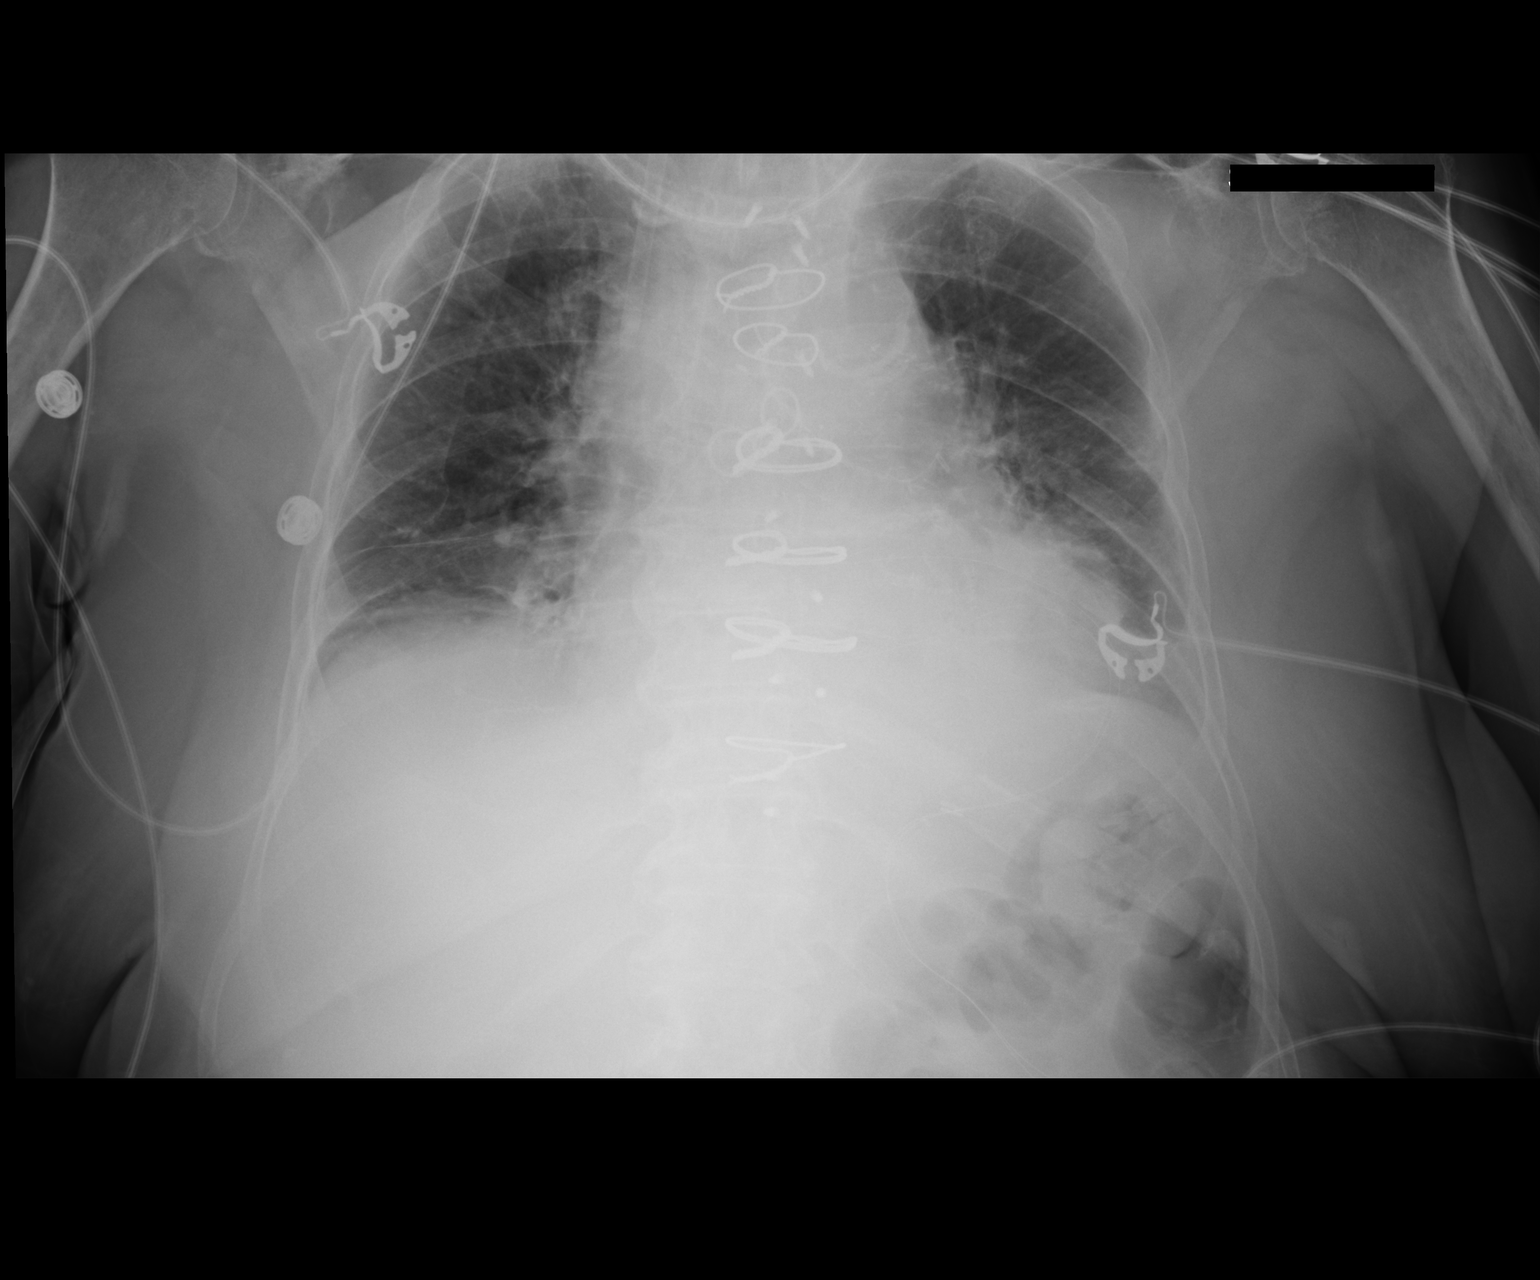

[1 of 1 positions shown; findings below may reference images not displayed]

FINDINGS: The PA catheter has been removed an only a right IJ sheath remains.
No pneumothorax. Mild atelectasis in the right base. No overt edema.
The mediastinal drain and left chest tube has also been removed.
IMPRESSION: Support apparatus as above. No pneumothorax after chest tube
removal. Mild right basilar atelectasis.

## 2018-03-17 IMAGING — CR DG CHEST 2V
2 series · 2 of 2 positions shown · non-contrast
Comparison: 11/20/2015.

CLINICAL DATA: CABG.

EXAM:
CHEST  2 VIEW

[w chest pa]
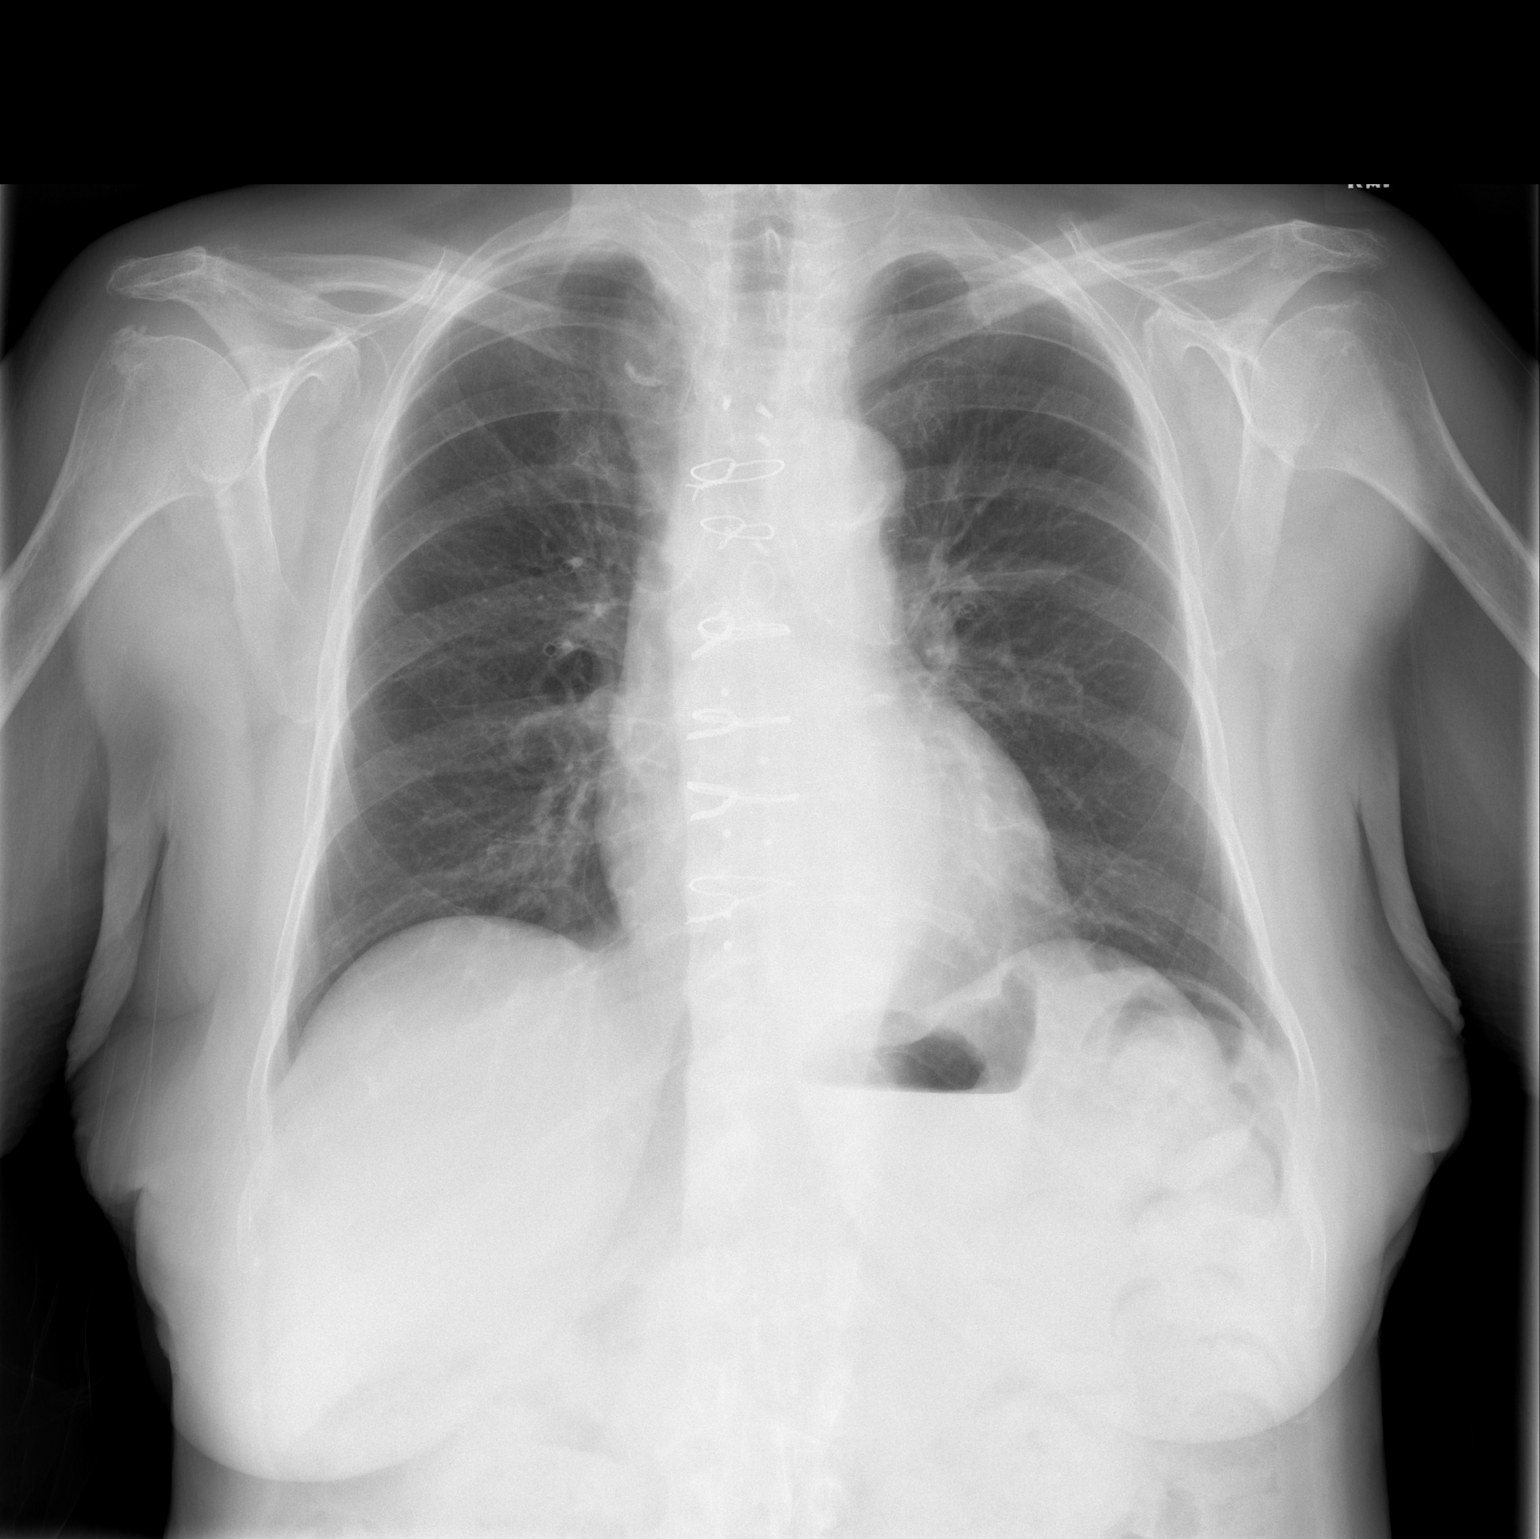

[w chest lat]
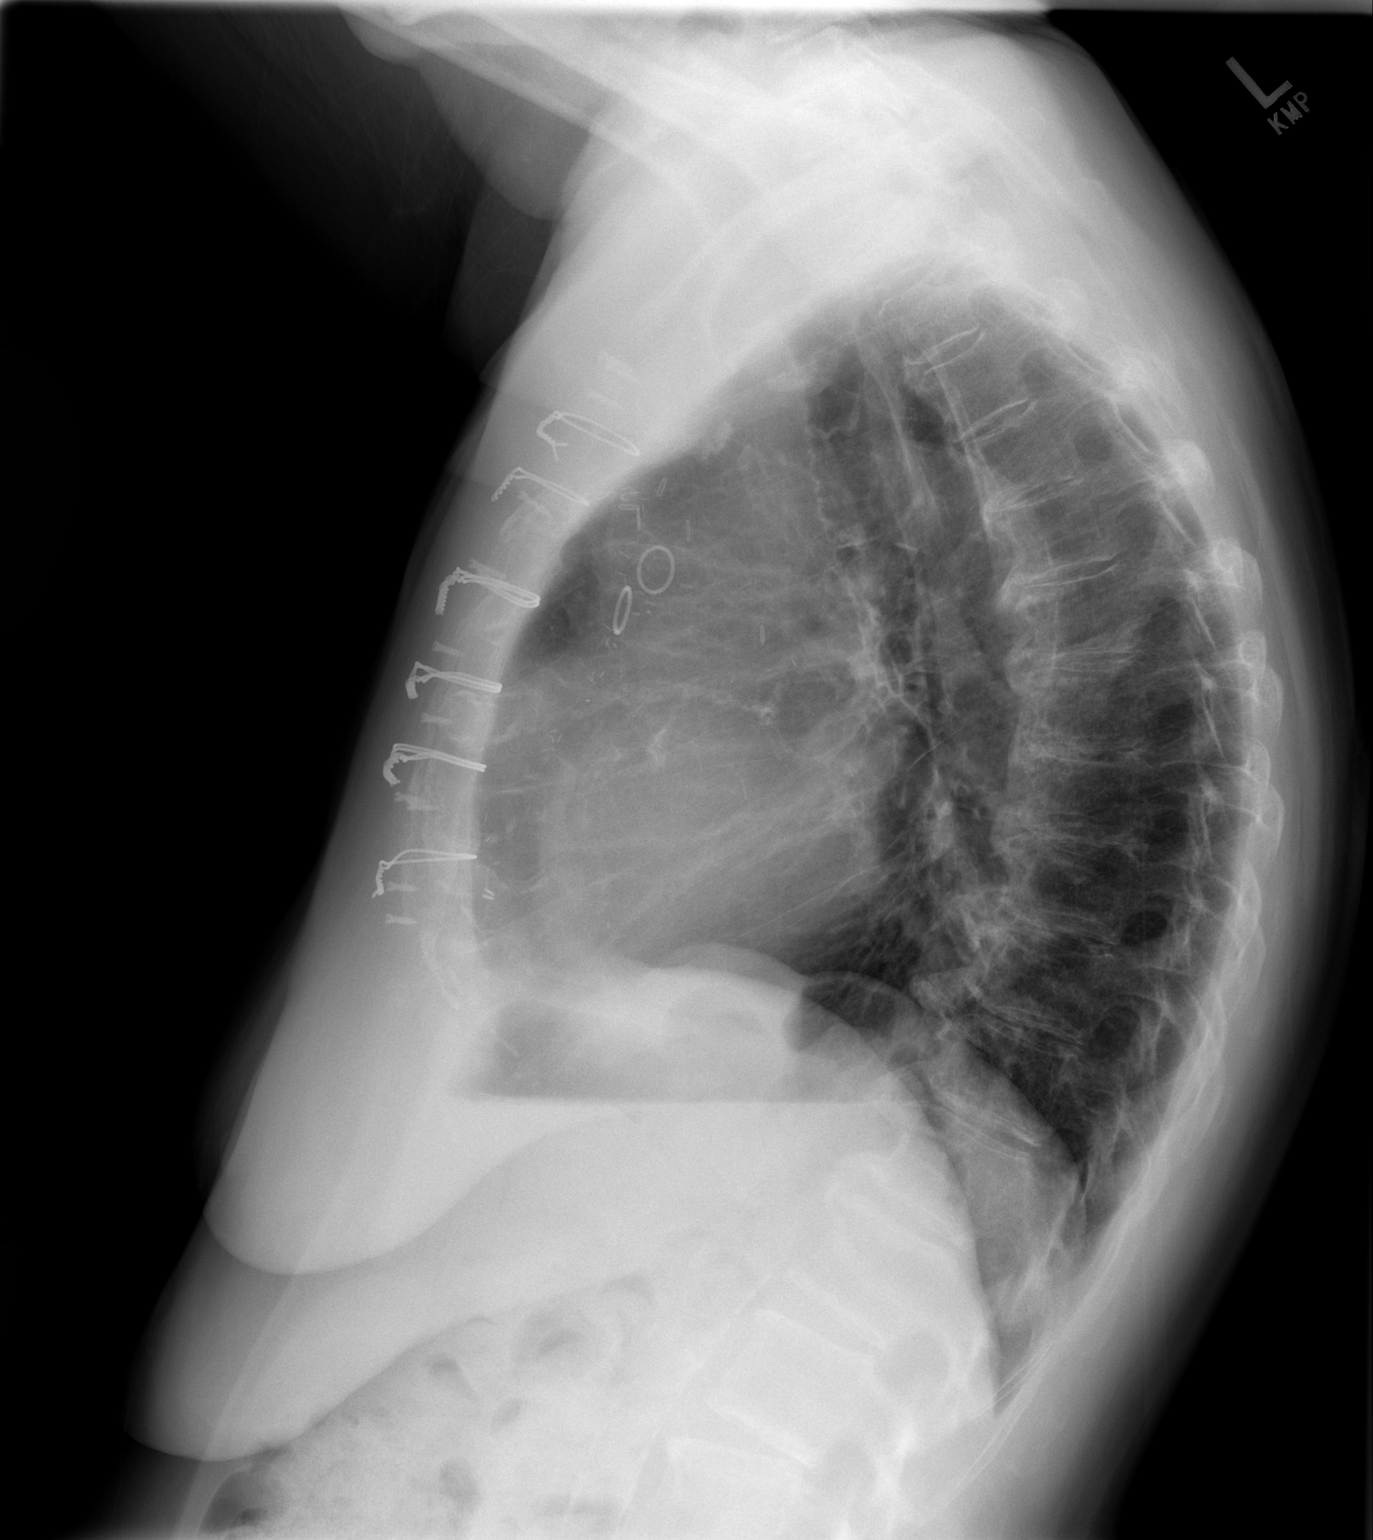

[2 of 2 positions shown; findings below may reference images not displayed]

FINDINGS: Right IJ sheath in good anatomic position. Prior CABG. Cardiomegaly.
No pulmonary venous congestion. No focal infiltrate. Interim
clearing of left base atelectasis and or infiltrate. No pleural
effusion or pneumothorax.
IMPRESSION: 1. Right IJ sheath in good anatomic position.  Graft

2.  Prior CABG.  Heart size normal.

3. Interim clearing of left base atelectasis and or infiltrate.

## 2018-04-13 ENCOUNTER — Ambulatory Visit: Payer: Self-pay | Admitting: Adult Health

## 2018-04-20 ENCOUNTER — Other Ambulatory Visit: Payer: Self-pay | Admitting: Internal Medicine

## 2018-06-12 ENCOUNTER — Other Ambulatory Visit: Payer: Self-pay | Admitting: Internal Medicine

## 2018-06-12 DIAGNOSIS — G4700 Insomnia, unspecified: Secondary | ICD-10-CM

## 2018-06-23 ENCOUNTER — Other Ambulatory Visit: Payer: Self-pay | Admitting: Internal Medicine

## 2018-06-23 DIAGNOSIS — R1112 Projectile vomiting: Secondary | ICD-10-CM

## 2018-06-23 MED ORDER — ONDANSETRON 8 MG PO TBDP: ORAL_TABLET | ORAL | 0 refills | Status: DC

## 2018-06-24 ENCOUNTER — Inpatient Hospital Stay (HOSPITAL_COMMUNITY)
Admission: EM | Admit: 2018-06-24 | Discharge: 2018-07-01 | DRG: 315 | Disposition: A | Discharge status: Home / Self Care | Payer: Medicare Other | Attending: Internal Medicine | Admitting: Internal Medicine

## 2018-06-24 ENCOUNTER — Other Ambulatory Visit: Payer: Self-pay

## 2018-06-24 ENCOUNTER — Emergency Department (HOSPITAL_COMMUNITY): Payer: Medicare Other

## 2018-06-24 ENCOUNTER — Ambulatory Visit (INDEPENDENT_AMBULATORY_CARE_PROVIDER_SITE_OTHER): Payer: Medicare Other | Admitting: Internal Medicine

## 2018-06-24 ENCOUNTER — Encounter: Payer: Self-pay | Admitting: Internal Medicine

## 2018-06-24 ENCOUNTER — Encounter (HOSPITAL_COMMUNITY): Payer: Self-pay

## 2018-06-24 VITALS — BP 84/52 | HR 56 | Temp 97.0°F | Resp 16 | Ht 61.0 in | Wt 109.4 lb

## 2018-06-24 DIAGNOSIS — Z7984 Long term (current) use of oral hypoglycemic drugs: Secondary | ICD-10-CM

## 2018-06-24 DIAGNOSIS — E876 Hypokalemia: Secondary | ICD-10-CM | POA: Diagnosis present

## 2018-06-24 DIAGNOSIS — Z833 Family history of diabetes mellitus: Secondary | ICD-10-CM

## 2018-06-24 DIAGNOSIS — I255 Ischemic cardiomyopathy: Secondary | ICD-10-CM | POA: Diagnosis present

## 2018-06-24 DIAGNOSIS — I25708 Atherosclerosis of coronary artery bypass graft(s), unspecified, with other forms of angina pectoris: Secondary | ICD-10-CM

## 2018-06-24 DIAGNOSIS — E861 Hypovolemia: Secondary | ICD-10-CM | POA: Diagnosis present

## 2018-06-24 DIAGNOSIS — E039 Hypothyroidism, unspecified: Secondary | ICD-10-CM | POA: Diagnosis present

## 2018-06-24 DIAGNOSIS — Z9841 Cataract extraction status, right eye: Secondary | ICD-10-CM

## 2018-06-24 DIAGNOSIS — I9589 Other hypotension: Secondary | ICD-10-CM | POA: Diagnosis not present

## 2018-06-24 DIAGNOSIS — E1122 Type 2 diabetes mellitus with diabetic chronic kidney disease: Secondary | ICD-10-CM | POA: Diagnosis not present

## 2018-06-24 DIAGNOSIS — Z9842 Cataract extraction status, left eye: Secondary | ICD-10-CM

## 2018-06-24 DIAGNOSIS — D649 Anemia, unspecified: Secondary | ICD-10-CM | POA: Diagnosis present

## 2018-06-24 DIAGNOSIS — Z7982 Long term (current) use of aspirin: Secondary | ICD-10-CM

## 2018-06-24 DIAGNOSIS — K8000 Calculus of gallbladder with acute cholecystitis without obstruction: Secondary | ICD-10-CM | POA: Diagnosis present

## 2018-06-24 DIAGNOSIS — F17290 Nicotine dependence, other tobacco product, uncomplicated: Secondary | ICD-10-CM | POA: Diagnosis present

## 2018-06-24 DIAGNOSIS — I951 Orthostatic hypotension: Secondary | ICD-10-CM | POA: Diagnosis not present

## 2018-06-24 DIAGNOSIS — Z1159 Encounter for screening for other viral diseases: Secondary | ICD-10-CM

## 2018-06-24 DIAGNOSIS — Z803 Family history of malignant neoplasm of breast: Secondary | ICD-10-CM

## 2018-06-24 DIAGNOSIS — R1112 Projectile vomiting: Secondary | ICD-10-CM

## 2018-06-24 DIAGNOSIS — F329 Major depressive disorder, single episode, unspecified: Secondary | ICD-10-CM | POA: Diagnosis present

## 2018-06-24 DIAGNOSIS — E1143 Type 2 diabetes mellitus with diabetic autonomic (poly)neuropathy: Secondary | ICD-10-CM | POA: Diagnosis present

## 2018-06-24 DIAGNOSIS — E079 Disorder of thyroid, unspecified: Secondary | ICD-10-CM | POA: Diagnosis present

## 2018-06-24 DIAGNOSIS — K219 Gastro-esophageal reflux disease without esophagitis: Secondary | ICD-10-CM | POA: Diagnosis present

## 2018-06-24 DIAGNOSIS — K3184 Gastroparesis: Secondary | ICD-10-CM | POA: Diagnosis present

## 2018-06-24 DIAGNOSIS — E785 Hyperlipidemia, unspecified: Secondary | ICD-10-CM | POA: Diagnosis present

## 2018-06-24 DIAGNOSIS — R1111 Vomiting without nausea: Secondary | ICD-10-CM | POA: Diagnosis not present

## 2018-06-24 DIAGNOSIS — F0390 Unspecified dementia without behavioral disturbance: Secondary | ICD-10-CM | POA: Diagnosis present

## 2018-06-24 DIAGNOSIS — R404 Transient alteration of awareness: Secondary | ICD-10-CM | POA: Diagnosis not present

## 2018-06-24 DIAGNOSIS — I959 Hypotension, unspecified: Secondary | ICD-10-CM | POA: Diagnosis not present

## 2018-06-24 DIAGNOSIS — D72819 Decreased white blood cell count, unspecified: Secondary | ICD-10-CM | POA: Diagnosis present

## 2018-06-24 DIAGNOSIS — Z8679 Personal history of other diseases of the circulatory system: Secondary | ICD-10-CM | POA: Diagnosis not present

## 2018-06-24 DIAGNOSIS — K0889 Other specified disorders of teeth and supporting structures: Secondary | ICD-10-CM | POA: Diagnosis present

## 2018-06-24 DIAGNOSIS — R8281 Pyuria: Secondary | ICD-10-CM | POA: Diagnosis present

## 2018-06-24 DIAGNOSIS — R112 Nausea with vomiting, unspecified: Secondary | ICD-10-CM | POA: Diagnosis present

## 2018-06-24 DIAGNOSIS — I10 Essential (primary) hypertension: Secondary | ICD-10-CM | POA: Diagnosis present

## 2018-06-24 DIAGNOSIS — F03B0 Unspecified dementia, moderate, without behavioral disturbance, psychotic disturbance, mood disturbance, and anxiety: Secondary | ICD-10-CM | POA: Diagnosis present

## 2018-06-24 DIAGNOSIS — E860 Dehydration: Secondary | ICD-10-CM

## 2018-06-24 DIAGNOSIS — I129 Hypertensive chronic kidney disease with stage 1 through stage 4 chronic kidney disease, or unspecified chronic kidney disease: Secondary | ICD-10-CM | POA: Diagnosis present

## 2018-06-24 DIAGNOSIS — N189 Chronic kidney disease, unspecified: Secondary | ICD-10-CM | POA: Diagnosis present

## 2018-06-24 DIAGNOSIS — Z823 Family history of stroke: Secondary | ICD-10-CM

## 2018-06-24 DIAGNOSIS — N182 Chronic kidney disease, stage 2 (mild): Secondary | ICD-10-CM

## 2018-06-24 DIAGNOSIS — E871 Hypo-osmolality and hyponatremia: Secondary | ICD-10-CM | POA: Diagnosis present

## 2018-06-24 DIAGNOSIS — E119 Type 2 diabetes mellitus without complications: Secondary | ICD-10-CM

## 2018-06-24 DIAGNOSIS — I2510 Atherosclerotic heart disease of native coronary artery without angina pectoris: Secondary | ICD-10-CM | POA: Diagnosis present

## 2018-06-24 DIAGNOSIS — Z951 Presence of aortocoronary bypass graft: Secondary | ICD-10-CM

## 2018-06-24 DIAGNOSIS — K5900 Constipation, unspecified: Secondary | ICD-10-CM | POA: Diagnosis present

## 2018-06-24 LAB — URINALYSIS, ROUTINE W REFLEX MICROSCOPIC
Bilirubin Urine: NEGATIVE
Glucose, UA: 50 mg/dL — AB
Hgb urine dipstick: NEGATIVE
Ketones, ur: 20 mg/dL — AB
Nitrite: NEGATIVE
Protein, ur: NEGATIVE mg/dL
Specific Gravity, Urine: 1.008 (ref 1.005–1.030)
pH: 6 (ref 5.0–8.0)

## 2018-06-24 LAB — SARS CORONAVIRUS 2 BY RT PCR (HOSPITAL ORDER, PERFORMED IN ~~LOC~~ HOSPITAL LAB): SARS Coronavirus 2: NEGATIVE

## 2018-06-24 LAB — CBC WITH DIFFERENTIAL/PLATELET
Abs Immature Granulocytes: 0.03 10*3/uL (ref 0.00–0.07)
Basophils Absolute: 0 10*3/uL (ref 0.0–0.1)
Basophils Relative: 0 %
Eosinophils Absolute: 0 10*3/uL (ref 0.0–0.5)
Eosinophils Relative: 0 %
HCT: 32.6 % — ABNORMAL LOW (ref 36.0–46.0)
Hemoglobin: 11 g/dL — ABNORMAL LOW (ref 12.0–15.0)
Immature Granulocytes: 1 %
Lymphocytes Relative: 32 %
Lymphs Abs: 1.1 10*3/uL (ref 0.7–4.0)
MCH: 30.6 pg (ref 26.0–34.0)
MCHC: 33.7 g/dL (ref 30.0–36.0)
MCV: 90.8 fL (ref 80.0–100.0)
Monocytes Absolute: 0.8 10*3/uL (ref 0.1–1.0)
Monocytes Relative: 21 %
Neutro Abs: 1.6 10*3/uL — ABNORMAL LOW (ref 1.7–7.7)
Neutrophils Relative %: 46 %
Platelets: 182 10*3/uL (ref 150–400)
RBC: 3.59 MIL/uL — ABNORMAL LOW (ref 3.87–5.11)
RDW: 13.8 % (ref 11.5–15.5)
WBC: 3.6 10*3/uL — ABNORMAL LOW (ref 4.0–10.5)
nRBC: 0 % (ref 0.0–0.2)

## 2018-06-24 LAB — VITAMIN B12: Vitamin B-12: 300 pg/mL (ref 180–914)

## 2018-06-24 LAB — COMPREHENSIVE METABOLIC PANEL
ALT: 9 U/L (ref 0–44)
AST: 16 U/L (ref 15–41)
Albumin: 3.9 g/dL (ref 3.5–5.0)
Alkaline Phosphatase: 33 U/L — ABNORMAL LOW (ref 38–126)
Anion gap: 11 (ref 5–15)
BUN: 13 mg/dL (ref 8–23)
CO2: 22 mmol/L (ref 22–32)
Calcium: 8.4 mg/dL — ABNORMAL LOW (ref 8.9–10.3)
Chloride: 98 mmol/L (ref 98–111)
Creatinine, Ser: 0.83 mg/dL (ref 0.44–1.00)
GFR calc Af Amer: >60 mL/min (ref >60)
GFR calc non Af Amer: >60 mL/min (ref >60)
Glucose, Bld: 136 mg/dL — ABNORMAL HIGH (ref 70–99)
Potassium: 3.7 mmol/L (ref 3.5–5.1)
Sodium: 131 mmol/L — ABNORMAL LOW (ref 135–145)
Total Bilirubin: 1 mg/dL (ref 0.3–1.2)
Total Protein: 7.1 g/dL (ref 6.5–8.1)

## 2018-06-24 LAB — FERRITIN: Ferritin: 244 ng/mL (ref 11–307)

## 2018-06-24 LAB — RETICULOCYTES
Immature Retic Fract: 2.8 % (ref 2.3–15.9)
RBC.: 3.3 MIL/uL — ABNORMAL LOW (ref 3.87–5.11)
Retic Count, Absolute: 22.1 10*3/uL (ref 19.0–186.0)
Retic Ct Pct: 0.7 % (ref 0.4–3.1)

## 2018-06-24 LAB — LACTIC ACID, PLASMA: Lactic Acid, Venous: 1 mmol/L (ref 0.5–1.9)

## 2018-06-24 LAB — IRON AND TIBC
Iron: 45 ug/dL (ref 28–170)
Saturation Ratios: 23 % (ref 10.4–31.8)
TIBC: 198 ug/dL — ABNORMAL LOW (ref 250–450)
UIBC: 153 ug/dL

## 2018-06-24 LAB — TSH: TSH: 0.288 u[IU]/mL — ABNORMAL LOW (ref 0.350–4.500)

## 2018-06-24 LAB — LIPASE, BLOOD: Lipase: 70 U/L — ABNORMAL HIGH (ref 11–51)

## 2018-06-24 MED ORDER — ONDANSETRON HCL 4 MG PO TABS
4.0000 mg | ORAL_TABLET | Freq: Four times a day (QID) | ORAL | Status: DC | PRN
  Administered 2018-06-27: 4 mg via ORAL
  Filled 2018-06-24: qty 1

## 2018-06-24 MED ORDER — INSULIN ASPART 100 UNIT/ML ~~LOC~~ SOLN
0.0000 [IU] | SUBCUTANEOUS | Status: DC
  Administered 2018-06-26 (×2): 2 [IU] via SUBCUTANEOUS
  Administered 2018-06-27: 1 [IU] via SUBCUTANEOUS
  Administered 2018-06-28: 2 [IU] via SUBCUTANEOUS
  Administered 2018-06-28: 1 [IU] via SUBCUTANEOUS
  Administered 2018-06-28: 3 [IU] via SUBCUTANEOUS
  Administered 2018-06-28: 1 [IU] via SUBCUTANEOUS
  Administered 2018-06-29 (×2): 2 [IU] via SUBCUTANEOUS
  Administered 2018-06-30: 1 [IU] via SUBCUTANEOUS
  Administered 2018-06-30: 3 [IU] via SUBCUTANEOUS

## 2018-06-24 MED ORDER — SODIUM CHLORIDE 0.9 % IV BOLUS
500.0000 mL | Freq: Once | INTRAVENOUS | Status: AC
  Administered 2018-06-24: 500 mL via INTRAVENOUS

## 2018-06-24 MED ORDER — SODIUM CHLORIDE 0.9 % IV SOLN
250.0000 mL | INTRAVENOUS | Status: DC | PRN
  Administered 2018-06-24: 250 mL via INTRAVENOUS

## 2018-06-24 MED ORDER — ONDANSETRON HCL 4 MG/2ML IJ SOLN
4.0000 mg | Freq: Once | INTRAMUSCULAR | Status: AC
  Administered 2018-06-24: 4 mg via INTRAVENOUS
  Filled 2018-06-24: qty 2

## 2018-06-24 MED ORDER — ACETAMINOPHEN 325 MG PO TABS: 650.0000 mg | ORAL_TABLET | Freq: Four times a day (QID) | ORAL | Status: DC | PRN

## 2018-06-24 MED ORDER — FLEET ENEMA 7-19 GM/118ML RE ENEM
1.0000 | ENEMA | Freq: Once | RECTAL | Status: DC | PRN
  Filled 2018-06-24: qty 1

## 2018-06-24 MED ORDER — SODIUM CHLORIDE 0.9% FLUSH
3.0000 mL | Freq: Two times a day (BID) | INTRAVENOUS | Status: DC
  Administered 2018-06-25 – 2018-06-29 (×2): 3 mL via INTRAVENOUS

## 2018-06-24 MED ORDER — ACETAMINOPHEN 650 MG RE SUPP: 650.0000 mg | Freq: Four times a day (QID) | RECTAL | Status: DC | PRN

## 2018-06-24 MED ORDER — BISACODYL 5 MG PO TBEC: 5.0000 mg | DELAYED_RELEASE_TABLET | Freq: Every day | ORAL | Status: DC | PRN

## 2018-06-24 MED ORDER — POLYETHYLENE GLYCOL 3350 17 G PO PACK: 17.0000 g | PACK | Freq: Every day | ORAL | Status: DC | PRN

## 2018-06-24 MED ORDER — SODIUM CHLORIDE 0.9% FLUSH: 3.0000 mL | INTRAVENOUS | Status: DC | PRN

## 2018-06-24 MED ORDER — SODIUM CHLORIDE 0.9% FLUSH
3.0000 mL | Freq: Two times a day (BID) | INTRAVENOUS | Status: DC
  Administered 2018-06-25 – 2018-06-26 (×2): 3 mL via INTRAVENOUS

## 2018-06-24 MED ORDER — SENNA 8.6 MG PO TABS
1.0000 | ORAL_TABLET | Freq: Two times a day (BID) | ORAL | Status: DC
  Administered 2018-06-24 – 2018-07-01 (×13): 8.6 mg via ORAL
  Filled 2018-06-24 (×14): qty 1

## 2018-06-24 MED ORDER — PROMETHAZINE HCL 25 MG/ML IJ SOLN: 12.5000 mg | Freq: Four times a day (QID) | INTRAMUSCULAR | Status: DC | PRN

## 2018-06-24 MED ORDER — SODIUM CHLORIDE 0.9 % IV SOLN
1.0000 g | Freq: Once | INTRAVENOUS | Status: AC
  Administered 2018-06-24: 1 g via INTRAVENOUS
  Filled 2018-06-24: qty 10

## 2018-06-24 MED ORDER — SODIUM CHLORIDE 0.9 % IV SOLN
1.0000 g | INTRAVENOUS | Status: DC
  Filled 2018-06-24: qty 10

## 2018-06-24 MED ORDER — HYDROCODONE-ACETAMINOPHEN 5-325 MG PO TABS
1.0000 | ORAL_TABLET | ORAL | Status: DC | PRN
  Administered 2018-06-27: 1 via ORAL
  Filled 2018-06-24: qty 1

## 2018-06-24 MED ORDER — ONDANSETRON HCL 4 MG/2ML IJ SOLN
4.0000 mg | Freq: Four times a day (QID) | INTRAMUSCULAR | Status: DC | PRN
  Administered 2018-06-25: 4 mg via INTRAVENOUS
  Filled 2018-06-24: qty 2

## 2018-06-24 MED ORDER — ENOXAPARIN SODIUM 30 MG/0.3ML ~~LOC~~ SOLN
30.0000 mg | Freq: Every day | SUBCUTANEOUS | Status: DC
  Administered 2018-06-24 – 2018-06-30 (×7): 30 mg via SUBCUTANEOUS
  Filled 2018-06-24 (×7): qty 0.3

## 2018-06-24 NOTE — ED Provider Notes (Signed)
[facility] DEPT Provider Note   CSN: [id number] Arrival date & time: 06/24/18  1616    History   Chief Complaint Chief Complaint  Patient presents with  . Hypotension    HPI [name] [name] is a 85 y.o. female.     HPI Patient states he has had several weeks of inability to tolerate oral intake.  States that she vomits immediately after eating or drinking.  She states she has been able to drink Coke.  Complains of generalized fatigue but denies dizziness.  Was at her primary 51 office today and noted to be hypotensive.  Transferred to the emergency department for evaluation.  Patient currently denies any specific pain including headache, pain with swallowing, chest pain, abdominal pain.  Denies blood in the vomit or stool.  No recent fever or chills. Past Medical History:  Diagnosis Date  . Anemia   . Anginal pain ([place])   . Cataract   . Colon polyp 2009    TUBULAR ADENOMA  . Coronary artery disease   . Depression   . Diabetes ([place])   . Diverticulosis of colon (without mention of hemorrhage) 2009  . Esophagitis 2009  . Gastritis 2009  . GERD (gastroesophageal reflux disease) 2009  . Hiatal hernia 2009  . Hyperlipemia   . Hypertension   . Thyroid disease    hypothyroid    Patient Active Problem List   Diagnosis Date Noted  . Nausea & vomiting 06/24/2018  . Hypotension due to hypovolemia 06/24/2018  . Leukopenia 06/24/2018  . History of ischemic cardiomyopathy 06/24/2018  . Hyponatremia 06/24/2018  . Type 2 diabetes mellitus ([place]) 04/07/2017  . Mild dementia ([place]) 07/26/2016  . S/P CABG x 4   . Coronary artery disease 11/16/2015  . Medication management 07/27/2013  . Vitamin D deficiency 02/01/2013  . GERD (gastroesophageal reflux disease)   . Hypothyroidism   . CKD stage 2 due to type 2 diabetes mellitus ([place]) 06/04/2007  . Hyperlipidemia 06/04/2007  . BMI 24.0-24.9, adult 06/04/2007  . Essential hypertension 06/04/2007     Past Surgical History:  Procedure Laterality Date  . BREAST BIOPSY Left 1999   neg  . CARDIAC CATHETERIZATION N/A 11/15/2015   Procedure: Left Heart Cath and Coronary Angiography;  Surgeon: Troy Sine, MD;  Location: [facility];  Service: Cardiovascular;  Laterality: N/A;  . COLONOSCOPY  05/2012   Dr. Sharlett Iles  . CORONARY ARTERY BYPASS GRAFT  11/16/2015   Procedure: OFF PUMP CORONARY ARTERY BYPASS GRAFTING (CABG) x FOUR, USING LEFT MAMMARY ARTERY  AND ENDOSCOPICALLY HARVESTED RIGHT GREATER SAPHENOUS VEIN.;  Surgeon: Melrose Nakayama, MD;  Location: [facility];  Service: Open Heart Surgery;;  . DILATION AND CURETTAGE OF UTERUS  1984  . TEE WITHOUT CARDIOVERSION N/A 11/16/2015   Procedure: TRANSESOPHAGEAL ECHOCARDIOGRAM (TEE);  Surgeon: Melrose Nakayama, MD;  Location: [facility];  Service: Open Heart Surgery;  Laterality: N/A;     OB History   No obstetric history on file.      Home Medications    Prior to Admission medications   Medication Sig Start Date End Date Taking? Authorizing Provider  aspirin EC 325 MG EC tablet Take 1 tablet (325 mg total) by mouth daily. 11/25/15  Yes Barrett, Erin R, PA-C  atorvastatin (LIPITOR) 80 MG tablet TAKE 1 TABLET DAILY FOR CHOLESTEROL 08/22/17  Yes Corbett, Caryl Pina, NP  metFORMIN (GLUCOPHAGE-XR) 500 MG 24 hr tablet TAKE 2 TABLETS BY MOUTH TWICE A DAY Patient taking differently: Take  500 mg by mouth 2 (two) times a day.  12/23/16  Yes Liane Comber, NP  mirtazapine (REMERON) 15 MG tablet TAKE 1 TABLET ONE HOUR BEFORE SLEEP Patient taking differently: Take 15 mg by mouth at bedtime. TAKE 1 TABLET ONE HOUR BEFORE SLEEP 06/12/18  Yes Liane Comber, NP  verapamil (CALAN-SR) 240 MG CR tablet Take 1 tablet daily with food for BP - needs OV before further refills 04/20/18  Yes [name], MD  blood glucose meter kit and supplies KIT Dispense based on patient and insurance preference. Check blood sugar 1 time daily-[id number]. 12/08/15    [name], MD  nitroGLYCERIN (NITROSTAT) 0.4 MG SL tablet Dissolve 1 tablet under tongue every 3 to 5 minutes if needed for ChestPain 10/31/15 01/27/17  [name], MD  ondansetron (ZOFRAN ODT) 8 MG disintegrating tablet Dissolve 1 tablet under tongue every 6 hours for nausea  or vomitting 06/23/18   [name], MD  polyethylene glycol [facility] / Floria Raveling) packet Take 17 g by mouth daily. Patient taking differently: Take 17 g by mouth daily as needed for moderate constipation.  10/28/17   Vicie Mutters, PA-C    Family History Family History  Problem Relation Age of Onset  . Diabetes Father   . Stroke Mother   . Breast cancer Maternal Aunt   . Throat cancer Maternal Uncle   . Lung cancer Brother   . Prostate cancer Brother   . Breast cancer Maternal Grandmother   . Rectal cancer Neg Hx   . Stomach cancer Neg Hx   . Colon cancer Neg Hx   . Esophageal cancer Neg Hx     Social History Social History   Tobacco Use  . Smoking status: Never Smoker  . Smokeless tobacco: Current User    Types: Snuff  Substance Use Topics  . Alcohol use: No  . Drug use: No     Allergies   Bee venom, Persantine [dipyridamole], and Actos [pioglitazone]   Review of Systems Review of Systems  Constitutional: Positive for fatigue. Negative for chills and fever.  HENT: Negative for sore throat and trouble swallowing.   Respiratory: Negative for cough and shortness of breath.   Cardiovascular: Negative for chest pain.  Gastrointestinal: Positive for nausea and vomiting. Negative for abdominal pain, constipation and diarrhea.  Genitourinary: Negative for difficulty urinating, dysuria and flank pain.  Musculoskeletal: Negative for back pain, myalgias and neck pain.  Skin: Negative for rash and wound.  Neurological: Negative for dizziness, light-headedness, numbness and headaches.  All other systems reviewed and are negative.    Physical Exam Updated Vital Signs BP (!) 91/54  (BP Location: Right Arm)   Pulse 68   Temp 98 F (36.7 C) (Oral)   Resp 19   Wt 53.6 kg   LMP  (LMP Unknown)   SpO2 98%   BMI 22.33 kg/m   Physical Exam Vitals signs and nursing note reviewed.  Constitutional:      Appearance: Normal appearance. She is well-developed.  HENT:     Head: Normocephalic and atraumatic.     Mouth/Throat:     Mouth: Mucous membranes are moist.  Eyes:     Pupils: Pupils are equal, round, and reactive to light.  Neck:     Musculoskeletal: Normal range of motion and neck supple.  Cardiovascular:     Rate and Rhythm: Normal rate and regular rhythm.     Heart sounds: No murmur. No friction rub. No gallop.   Pulmonary:     Effort:  Pulmonary effort is normal. No respiratory distress.     Breath sounds: Normal breath sounds. No stridor. No wheezing, rhonchi or rales.  Chest:     Chest wall: No tenderness.  Abdominal:     General: Bowel sounds are normal. There is no distension.     Palpations: Abdomen is soft. There is no mass.     Tenderness: There is no abdominal tenderness. There is no right CVA tenderness, left CVA tenderness, guarding or rebound.     Hernia: No hernia is present.  Musculoskeletal: Normal range of motion.        General: No swelling, tenderness, deformity or signs of injury.     Right lower leg: No edema.     Left lower leg: No edema.  Skin:    General: Skin is warm and dry.     Capillary Refill: Capillary refill takes less than 2 seconds.     Findings: No erythema or rash.  Neurological:     General: No focal deficit present.     Mental Status: She is alert and oriented to person, place, and time.  Psychiatric:        Behavior: Behavior normal.      ED Treatments / Results  Labs (all labs ordered are listed, but only abnormal results are displayed) Labs Reviewed  CBC WITH DIFFERENTIAL/PLATELET - Abnormal; Notable for the following components:      Result Value   WBC 3.6 (*)    RBC 3.59 (*)    Hemoglobin 11.0 (*)     HCT 32.6 (*)    Neutro Abs 1.6 (*)    All other components within normal limits  COMPREHENSIVE METABOLIC PANEL - Abnormal; Notable for the following components:   Sodium 131 (*)    Glucose, Bld 136 (*)    Calcium 8.4 (*)    Alkaline Phosphatase 33 (*)    All other components within normal limits  LIPASE, BLOOD - Abnormal; Notable for the following components:   Lipase 70 (*)    All other components within normal limits  URINALYSIS, ROUTINE W REFLEX MICROSCOPIC - Abnormal; Notable for the following components:   Glucose, UA 50 (*)    Ketones, ur 20 (*)    Leukocytes,Ua MODERATE (*)    Bacteria, UA RARE (*)    All other components within normal limits  COMPREHENSIVE METABOLIC PANEL - Abnormal; Notable for the following components:   Glucose, Bld 103 (*)    Calcium 8.1 (*)    Total Protein 6.4 (*)    AST 13 (*)    Alkaline Phosphatase 31 (*)    All other components within normal limits  CBC WITH DIFFERENTIAL/PLATELET - Abnormal; Notable for the following components:   WBC 3.3 (*)    RBC 3.03 (*)    Hemoglobin 9.3 (*)    HCT 27.9 (*)    Neutro Abs 1.3 (*)    All other components within normal limits  TSH - Abnormal; Notable for the following components:   TSH 0.288 (*)    All other components within normal limits  FOLATE - Abnormal; Notable for the following components:   Folate 3.3 (*)    All other components within normal limits  IRON AND TIBC - Abnormal; Notable for the following components:   TIBC 198 (*)    All other components within normal limits  RETICULOCYTES - Abnormal; Notable for the following components:   RBC. 3.30 (*)    All other components within normal limits  GLUCOSE,  CAPILLARY - Abnormal; Notable for the following components:   Glucose-Capillary 114 (*)    All other components within normal limits  T4, FREE - Abnormal; Notable for the following components:   Free T4 1.25 (*)    All other components within normal limits  HEMOGLOBIN A1C - Abnormal;  Notable for the following components:   Hgb A1c MFr Bld 6.1 (*)    All other components within normal limits  SARS CORONAVIRUS 2 (HOSPITAL ORDER, [facility])  CULTURE, BLOOD (ROUTINE X 2)  CULTURE, BLOOD (ROUTINE X 2)  URINE CULTURE  LACTIC ACID, PLASMA  CORTISOL  VITAMIN B12  FERRITIN  GLUCOSE, CAPILLARY  GLUCOSE, CAPILLARY  GLUCOSE, CAPILLARY    EKG EKG Interpretation  Date/Time:  Wednesday June 24 2018 16:39:52 EDT Ventricular Rate:  69 PR Interval:    QRS Duration: 97 QT Interval:  395 QTC Calculation: 424 R Axis:   -25 Text Interpretation:  Sinus rhythm Borderline left axis deviation Posterior infarct, old Confirmed by Julianne Rice [phone]) on 06/24/2018 5:37:05 PM Also confirmed by Julianne Rice [phone]), editor Philomena Doheny [phone])  on 06/25/2018 10:36:19 AM   Radiology Dg Abd Acute 2+v W 1v Chest  Result Date: 06/24/2018 CLINICAL DATA:  Hypotension. EXAM: DG ABDOMEN ACUTE W/ 1V CHEST COMPARISON:  12/19/2015 FINDINGS: The patient is status post prior median sternotomy. The heart size is stable. Aortic calcifications are noted. Chronic lung changes are noted bilaterally without evidence of a focal infiltrate or pneumothorax. There is no large pleural effusion. No acute osseous abnormality. The bowel gas pattern is nonobstructive. There is a large amount of stool throughout the colon. Phleboliths project over the patient's pelvis. Degenerative changes are noted of the lumbar spine. No radiographic evidence for pneumatosis or free air. IMPRESSION: 1. No acute cardiopulmonary process. 2. Nonobstructive bowel gas pattern. 3. Large amount of stool in the colon. Electronically Signed   By: Constance Holster M.D.   On: 06/24/2018 19:24    Procedures Procedures (including critical care time)  Medications Ordered in ED Medications  insulin aspart (novoLOG) injection 0-9 Units (0 Units Subcutaneous Not Given 06/25/18 1251)  enoxaparin (LOVENOX)  injection 30 mg (30 mg Subcutaneous Given 06/24/18 2241)  sodium chloride flush (NS) 0.9 % injection 3 mL (0 mLs Intravenous Duplicate 8/36/62 9476)  sodium chloride flush (NS) 0.9 % injection 3 mL (3 mLs Intravenous Not Given 06/25/18 1006)  sodium chloride flush (NS) 0.9 % injection 3 mL (has no administration in time range)  0.9 %  sodium chloride infusion ( Intravenous Rate/Dose Verify 06/25/18 0500)  acetaminophen (TYLENOL) tablet 650 mg (has no administration in time range)    Or  acetaminophen (TYLENOL) suppository 650 mg (has no administration in time range)  HYDROcodone-acetaminophen (NORCO/VICODIN) 5-325 MG per tablet 1 tablet (has no administration in time range)  polyethylene glycol (MIRALAX / GLYCOLAX) packet 17 g (has no administration in time range)  senna (SENOKOT) tablet 8.6 mg (8.6 mg Oral Given 06/25/18 1006)  bisacodyl (DULCOLAX) EC tablet 5 mg (has no administration in time range)  ondansetron (ZOFRAN) tablet 4 mg ( Oral See Alternative 06/25/18 0342)    Or  ondansetron (ZOFRAN) injection 4 mg (4 mg Intravenous Given 06/25/18 0342)  sodium phosphate (FLEET) 7-19 GM/118ML enema 1 enema (has no administration in time range)  promethazine (PHENERGAN) injection 12.5 mg (has no administration in time range)  folic acid (FOLVITE) tablet 1 mg (1 mg Oral Given 06/25/18 1006)  aspirin EC tablet 325 mg (has  no administration in time range)  atorvastatin (LIPITOR) tablet 80 mg (has no administration in time range)  mirtazapine (REMERON) tablet 15 mg (has no administration in time range)  feeding supplement (ENSURE ENLIVE) (ENSURE ENLIVE) liquid 237 mL (has no administration in time range)  sodium chloride 0.9 % bolus 500 mL (0 mLs Intravenous Stopped 06/24/18 1912)  sodium chloride 0.9 % bolus 500 mL (0 mLs Intravenous Stopped 06/24/18 2045)  cefTRIAXone (ROCEPHIN) 1 g in sodium chloride 0.9 % 100 mL IVPB (0 g Intravenous Stopped 06/24/18 2045)  ondansetron (ZOFRAN) injection 4 mg (4 mg  Intravenous Given 06/24/18 1957)  sodium chloride 0.9 % bolus 500 mL (500 mLs Intravenous New Bag/Given (Non-Interop) 06/24/18 2133)     Initial Impression / Assessment and Plan / ED Course  I have reviewed the triage vital signs and the nursing notes.  Pertinent labs & imaging results that were available during my care of the patient were reviewed by me and considered in my medical decision making (see chart for details).        Patient with persistent hypotension despite IV fluids.  Likely has UTI.  Given dose of IV Rocephin.  Urine sent for culture.  Discussed with hospitalist will see patient in the emergency department and admit.  Final Clinical Impressions(s) / ED Diagnoses   Final diagnoses:  Pain, dental  Intractable nausea and vomiting    ED Discharge Orders    None       Julianne Rice, MD 06/25/18 [phone]

## 2018-06-24 NOTE — Progress Notes (Signed)
Pharmacy: [facility] Dose adjustment  [facility] 40 mg > 30 mg for wt 49.6 kg  Eudelia Bunch, Pharm.D 06/24/2018 10:23 PM

## 2018-06-24 NOTE — Progress Notes (Signed)
[facility]  [name], M.D.  Uvaldo Bristle. Silverio Lay, P.A.-C Liane Comber, [place] [address] [place] [address] Telephone [phone] Telefax [phone]  History of Present Illness:       This very nice 85 y.o. MWF  with mild Dementia, HTN, ASCAD, HLD, Pre-Diabetes and Vitamin D Deficiency who has been lost to follow-up for 8 months is brought in today by hr son for 3-4 week hx.of nausea and occasional emesis . History according to son is inability to eat any food and is has only taken scant amounts of liquids for 2-3 weeks. Patient is not forthcoming regarding any sx's and denies any pain, dysphagia, odynophagia, heartburn, reflux , indigestion or any abdominal pains. Denies fever, chills, rashes. She can't remember when her last BM was. Denies any blood in emesis or BM's. Son relates she has not taken any of her meds , metformin or otherwise for a "long time".  She has lost 28# since her last visit in Oct 2019 - from 137# to current 109#.  Medications  Current Outpatient Medications (Endocrine & Metabolic):  .  metFORMIN (GLUCOPHAGE-XR) 500 MG 24 hr tablet, TAKE 2 TABLETS BY MOUTH TWICE A DAY .  atorvastatin (LIPITOR) 80 MG tablet, TAKE 1 TABLET DAILY FOR CHOLESTEROL .  isosorbide mononitrate (IMDUR) 30 MG 24 hr tablet, TAKE 1 TABLET DAILY .  lisinopril (PRINIVIL,ZESTRIL) 20 MG tablet, TAKE 1 TABLET DAILY FOR BLOOD PRESSURE AND DIABETIC KIDNEY PROTECTION .  nitroGLYCERIN (NITROSTAT) 0.4 MG SL tablet, Dissolve 1 tablet under tongue every 3 to 5 minutes if needed for ChestPain .  verapamil (CALAN-SR) 240 MG CR tablet, Take 1 tablet daily with food for BP - needs OV before further refills .  aspirin EC 325 MG EC tablet, Take 1 tablet (325 mg total) by mouth daily. .  mirtazapine (REMERON) 15 MG tablet, TAKE 1 TABLET ONE HOUR BEFORE SLEEP .  ondansetron (ZOFRAN ODT) 8 MG disintegrating tablet,  Dissolve 1 tablet under tongue every 6 hours for nausea  or vomitting .  polyethylene glycol (MIRALAX / GLYCOLAX) packet, Take 17 g by mouth daily.  Problem list She has CKD stage 2 due to type 2 diabetes mellitus ([place]); Hyperlipidemia; BMI 24.0-24.9, adult; Essential hypertension; GERD (gastroesophageal reflux disease); Hypothyroidism; Vitamin D deficiency; Medication management; Coronary artery disease; S/P CABG x 4; Mild dementia ([place]); and Type 2 diabetes mellitus ([place]) on their problem list.   Observations/Objective:  BP  84/52   P 56   T 97 F   R 16   Ht 5\' 1"     Wt 109 lb   BMI 20.67  Recheck standing BP at 80/40.  Chronically ill wasted elderly white female. No cyanosis, icterus or rash. Skin tents.   HEENT - WNL. O/P moist w/o lesions. Neck - Supple. No JVD, bruits, nodes, thyromegaly.  Chest - Distant clear BS. Cor - Soft HS. RRR w/o sig Gr 2 sys murmur at Aortic areas & apex. PP - not palpable.  Abd- soft w/o guarding tenderness and bowel sounds decreased. MS- Generalized decrease in muscle power, tone & bulk. Unstable gait. Unable to stand or walk w/o assistance or support.  Neuro -  Nl w/o focal abnormalities.  Assessment and Plan:  1. Orthostatic hypotension  2. Dehydration / Weight Loss  3. Projectile vomiting with nausea  4. Type 2 diabetes mellitus with stage 2 chronic kidney disease, without long-term current use of insulin ([place])  Follow Up :  -  Advised transport to hospital for further evaluation & management  I discussed the assessment and treatment plan with the patient's son & he agreed with the plan and demonstrated an understanding of the instructions.  Kirtland Bouchard, MD

## 2018-06-24 NOTE — ED Notes (Addendum)
Ambulated patient to restroom with 1 assist to provide UA sample.

## 2018-06-24 NOTE — ED Notes (Signed)
ED TO INPATIENT HANDOFF REPORT  Name/Age/Gender [name] [name] 85 y.o. female  Code Status Code Status History    Date Active Date Inactive Code Status Order ID Comments User Context   11/16/2015 1431 11/25/2015 1614 Full Code [id number]  Arnoldo Lenis Inpatient   11/15/2015 1235 11/16/2015 1430 Full Code [id number]  Troy Sine, MD Inpatient   Advance Care Planning Activity      Home/SNF/Other Home  Chief Complaint hypotensive  Level of Care/Admitting Diagnosis ED Disposition    ED Disposition Condition [facility] Area: [place] [[id number]]  Level of Care: Telemetry [5]  Admit to tele based on following criteria: Monitor for Ischemic changes  Covid Evaluation: Screening Protocol (No Symptoms)  Diagnosis: Hypotension due to hypovolemia [0630160]  Admitting Physician: Vianne Bulls [[id number]]  Attending Physician: Vianne Bulls [[id number]]  PT Class (Do Not Modify): Observation [104]  PT Acc Code (Do Not Modify): Observation [10022]       Medical History Past Medical History:  Diagnosis Date  . Anemia   . Anginal pain ([place])   . Cataract   . Colon polyp 2009    TUBULAR ADENOMA  . Coronary artery disease   . Depression   . Diabetes ([place])   . Diverticulosis of colon (without mention of hemorrhage) 2009  . Esophagitis 2009  . Gastritis 2009  . GERD (gastroesophageal reflux disease) 2009  . Hiatal hernia 2009  . Hyperlipemia   . Hypertension   . Thyroid disease    hypothyroid    Allergies Allergies  Allergen Reactions  . Bee Venom Anaphylaxis and Other (See Comments)    Eyes swell, cannot breathe  . Persantine [Dipyridamole] Other (See Comments)    "went crazy"  . Actos [Pioglitazone] Other (See Comments)    UNSPECIFIED REACTION     IV Location/Drains/Wounds Patient Lines/Drains/Airways Status   Active Line/Drains/Airways    Name:   Placement date:   Placement time:   Site:   Days:   Peripheral IV  06/24/18 Right Hand   06/24/18    1739    Hand   less than 1   Incision (Closed) 11/16/15 Chest Other (Comment)   11/16/15    1301     951   Incision (Closed) 11/16/15 Leg Right   11/16/15    1301     951          Labs/Imaging Results for orders placed or performed during the hospital encounter of 06/24/18 (from the past 48 hour(s))  CBC with Differential/Platelet     Status: Abnormal   Collection Time: 06/24/18  5:39 PM  Result Value Ref Range   WBC 3.6 (L) 4.0 - 10.5 K/uL    Comment: WHITE COUNT CONFIRMED ON SMEAR   RBC 3.59 (L) 3.87 - 5.11 MIL/uL   Hemoglobin 11.0 (L) 12.0 - 15.0 g/dL   HCT 32.6 (L) 36.0 - 46.0 %   MCV 90.8 80.0 - 100.0 fL   MCH 30.6 26.0 - 34.0 pg   MCHC 33.7 30.0 - 36.0 g/dL   RDW 13.8 11.5 - 15.5 %   Platelets 182 150 - 400 K/uL   nRBC 0.0 0.0 - 0.2 %   Neutrophils Relative % 46 %   Neutro Abs 1.6 (L) 1.7 - 7.7 K/uL   Lymphocytes Relative 32 %   Lymphs Abs 1.1 0.7 - 4.0 K/uL   Monocytes Relative 21 %   Monocytes Absolute 0.8 0.1 - 1.0 K/uL  Eosinophils Relative 0 %   Eosinophils Absolute 0.0 0.0 - 0.5 K/uL   Basophils Relative 0 %   Basophils Absolute 0.0 0.0 - 0.1 K/uL   Immature Granulocytes 1 %   Abs Immature Granulocytes 0.03 0.00 - 0.07 K/uL   Schistocytes PRESENT    Burr Cells PRESENT     Comment: Performed at [facility], [place] [address], [place], [place] [address]  Comprehensive metabolic panel     Status: Abnormal   Collection Time: 06/24/18  5:39 PM  Result Value Ref Range   Sodium 131 (L) 135 - 145 mmol/L   Potassium 3.7 3.5 - 5.1 mmol/L   Chloride 98 98 - 111 mmol/L   CO2 22 22 - 32 mmol/L   Glucose, Bld 136 (H) 70 - 99 mg/dL   BUN 13 8 - 23 mg/dL   Creatinine, Ser 0.83 0.44 - 1.00 mg/dL   Calcium 8.4 (L) 8.9 - 10.3 mg/dL   Total Protein 7.1 6.5 - 8.1 g/dL   Albumin 3.9 3.5 - 5.0 g/dL   AST 16 15 - 41 U/L   ALT 9 0 - 44 U/L   Alkaline Phosphatase 33 (L) 38 - 126 U/L   Total Bilirubin 1.0 0.3 - 1.2 mg/dL    GFR calc non Af Amer >60 >60 mL/min   GFR calc Af Amer >60 >60 mL/min   Anion gap 11 5 - 15    Comment: Performed at [facility], [place] [address] [place], [place] [address]  Lipase, blood     Status: Abnormal   Collection Time: 06/24/18  5:39 PM  Result Value Ref Range   Lipase 70 (H) 11 - 51 U/L    Comment: Performed at [facility], [place] [address] [place], [place]  [address]  Urinalysis, Routine w reflex microscopic     Status: Abnormal   Collection Time: 06/24/18  6:39 PM  Result Value Ref Range   Color, Urine YELLOW YELLOW   APPearance CLEAR CLEAR   Specific Gravity, Urine 1.008 1.005 - 1.030   pH 6.0 5.0 - 8.0   Glucose, UA 50 (A) NEGATIVE mg/dL   Hgb urine dipstick NEGATIVE NEGATIVE   Bilirubin Urine NEGATIVE NEGATIVE   Ketones, ur 20 (A) NEGATIVE mg/dL   Protein, ur NEGATIVE NEGATIVE mg/dL   Nitrite NEGATIVE NEGATIVE   Leukocytes,Ua MODERATE (A) NEGATIVE   RBC / HPF 0-5 0 - 5 RBC/hpf   WBC, UA 6-10 0 - 5 WBC/hpf   Bacteria, UA RARE (A) NONE SEEN   Squamous Epithelial / LPF 0-5 0 - 5    Comment: Performed at [facility], [place] [address], [place], [place] [address]   Dg Abd Acute 2+v W 1v Chest  Result Date: 06/24/2018 CLINICAL DATA:  Hypotension. EXAM: DG ABDOMEN ACUTE W/ 1V CHEST COMPARISON:  12/19/2015 FINDINGS: The patient is status post prior median sternotomy. The heart size is stable. Aortic calcifications are noted. Chronic lung changes are noted bilaterally without evidence of a focal infiltrate or pneumothorax. There is no large pleural effusion. No acute osseous abnormality. The bowel gas pattern is nonobstructive. There is a large amount of stool throughout the colon. Phleboliths project over the patient's pelvis. Degenerative changes are noted of the lumbar spine. No radiographic evidence for pneumatosis or free air. IMPRESSION: 1. No acute cardiopulmonary process. 2. Nonobstructive bowel gas pattern. 3. Large  amount of stool in the colon. Electronically Signed   By: Jamie Kato.D.  On: 06/24/2018 19:24    Pending Labs Unresulted Labs (From admission, onward)    Start     Ordered   06/24/18 2052  SARS Coronavirus 2 (CEPHEID - Performed in [place] hospital lab), Hosp Order  (Asymptomatic Patients Labs)  Once,   STAT    Question:  Rule Out  Answer:  Yes   06/24/18 2052   06/24/18 2052  Culture, blood (Routine X 2) w Reflex to ID Panel  BLOOD CULTURE X 2,   STAT     06/24/18 2052   06/24/18 2052  Lactic acid, plasma  ONCE - STAT,   STAT     06/24/18 2052   06/24/18 1948  Urine culture  Add-on,   AD     06/24/18 1947   Signed and Held  Creatinine, serum  (enoxaparin (LOVENOX)    CrCl >/= 30 ml/min)  Weekly,   R    Comments: while on enoxaparin therapy    Signed and Held   Signed and Held  Comprehensive metabolic panel  Tomorrow morning,   R     Signed and Held   Signed and Held  CBC WITH DIFFERENTIAL  Tomorrow morning,   R     Signed and Held   Signed and Held  TSH  Once,   R     Signed and Held   Signed and Held  Cortisol  Once,   R     Signed and Held   Signed and Held  Vitamin B12  (Anemia Panel (PNL))  Once,   R     Signed and Held   Signed and Held  Folate  (Anemia Panel (PNL))  Once,   R     Signed and Held   Signed and Held  Iron and TIBC  (Anemia Panel (PNL))  Once,   R     Signed and Held   Signed and Held  Ferritin  (Anemia Panel (PNL))  Once,   R     Signed and Held   Signed and Held  Reticulocytes  (Anemia Panel (PNL))  Once,   R     Signed and Held          Vitals/Pain Today's Vitals   06/24/18 2000 06/24/18 2049 06/24/18 2100 06/24/18 2130  BP: 97/80 (!) 81/69 (!) 104/49 114/66  Pulse: 72 66 64 78  Resp: 19 (!) 24 18 17   Temp:      TempSrc:      SpO2: 100% 96% 96% 97%  PainSc:        Isolation Precautions No active isolations  Medications Medications  promethazine (PHENERGAN) injection 12.5 mg (has no administration in time range)  sodium  chloride 0.9 % bolus 500 mL (0 mLs Intravenous Stopped 06/24/18 1912)  sodium chloride 0.9 % bolus 500 mL (0 mLs Intravenous Stopped 06/24/18 2045)  cefTRIAXone (ROCEPHIN) 1 g in sodium chloride 0.9 % 100 mL IVPB (0 g Intravenous Stopped 06/24/18 2045)  ondansetron (ZOFRAN) injection 4 mg (4 mg Intravenous Given 06/24/18 1957)  sodium chloride 0.9 % bolus 500 mL (500 mLs Intravenous New Bag/Given (Non-Interop) 06/24/18 2133)    Mobility walks with device

## 2018-06-24 NOTE — ED Triage Notes (Signed)
Per EMS, patient sent from PCP for hypotension, patient reports vomiting for three weeks. Denies diarrhea. Can tolerate liquids. Hx diabetes. Denies pain. Ambulatory. Son is caretaker.  BP 84/64 291ml NS with EMS CBG 231 T 97.9 O2 100% HR 99

## 2018-06-24 NOTE — H&P (Signed)
History and Physical    TANEEKA CURTNER YOV:[id number] DOB: 06-21-1931 DOA: 06/24/2018  PCP: [name], MD   Patient coming from: Home   Chief Complaint: N/V  HPI: [name] [name] is a 85 y.o. female with medical history significant for dementia, coronary artery disease, ischemic cardiomyopathy, hypothyroidism, and type 2 diabetes mellitus, now presenting to emergency department for evaluation of nausea, vomiting, and low blood pressure.  Patient has reportedly been experiencing nausea with nonbloody vomiting for the past 3 to 4 weeks, has been able to tolerate cola, but has vomited anytime she attempts to drink water or eat.  She has not been taking her medications "for a long time" per report of her son.  She was taken to see her PCP today for evaluation of these complaints, was noted to have a blood pressure of 84/52, and was directed to the ED for further evaluation.  She was given 250 cc of IV fluids with EMS prior to arrival in the ED. patient denies abdominal pain, diarrhea, fevers, or chills.  She denies any dysphasia.  ED Course: Upon arrival to the ED, patient is found to be afebrile, saturating well on room air, bradycardic in the mid 50s, and with blood pressure 81/69.  EKG features a sinus rhythm and chest x-ray is negative for acute cardiopulmonary disease.  KUB features a nonobstructive bowel gas pattern with large amount of stool in the colon.  Chemistry panel is notable for a sodium of 131 and lipase is elevated to 70.  CBC features a WBC of 3600 and a mild normocytic anemia.  Patient was given 1.5 L of IV fluids in the ED.  Blood and urine cultures were ordered, COVID-19 screening test in process, and the patient was given a dose of IV Rocephin for possible UTI.  Hospitalists are asked to admit for further evaluation and management.  Review of Systems:  All other systems reviewed and apart from HPI, are negative.  Past Medical History:  Diagnosis Date  . Anemia   . Anginal  pain ([place])   . Cataract   . Colon polyp 2009    TUBULAR ADENOMA  . Coronary artery disease   . Depression   . Diabetes ([place])   . Diverticulosis of colon (without mention of hemorrhage) 2009  . Esophagitis 2009  . Gastritis 2009  . GERD (gastroesophageal reflux disease) 2009  . Hiatal hernia 2009  . Hyperlipemia   . Hypertension   . Thyroid disease    hypothyroid    Past Surgical History:  Procedure Laterality Date  . BREAST BIOPSY Left 1999   neg  . CARDIAC CATHETERIZATION N/A 11/15/2015   Procedure: Left Heart Cath and Coronary Angiography;  Surgeon: Troy Sine, MD;  Location: [facility];  Service: Cardiovascular;  Laterality: N/A;  . COLONOSCOPY  05/2012   Dr. Sharlett Iles  . CORONARY ARTERY BYPASS GRAFT  11/16/2015   Procedure: OFF PUMP CORONARY ARTERY BYPASS GRAFTING (CABG) x FOUR, USING LEFT MAMMARY ARTERY  AND ENDOSCOPICALLY HARVESTED RIGHT GREATER SAPHENOUS VEIN.;  Surgeon: Melrose Nakayama, MD;  Location: [facility];  Service: Open Heart Surgery;;  . DILATION AND CURETTAGE OF UTERUS  1984  . TEE WITHOUT CARDIOVERSION N/A 11/16/2015   Procedure: TRANSESOPHAGEAL ECHOCARDIOGRAM (TEE);  Surgeon: Melrose Nakayama, MD;  Location: [facility];  Service: Open Heart Surgery;  Laterality: N/A;     reports that she has never smoked. Her smokeless tobacco use includes snuff. She reports that she does not drink alcohol  or use drugs.  Allergies  Allergen Reactions  . Bee Venom Anaphylaxis and Other (See Comments)    Eyes swell, cannot breathe  . Persantine [Dipyridamole] Other (See Comments)    "went crazy"  . Actos [Pioglitazone] Other (See Comments)    UNSPECIFIED REACTION     Family History  Problem Relation Age of Onset  . Diabetes Father   . Stroke Mother   . Breast cancer Maternal Aunt   . Throat cancer Maternal Uncle   . Lung cancer Brother   . Prostate cancer Brother   . Breast cancer Maternal Grandmother   . Rectal cancer Neg Hx   . Stomach cancer Neg Hx    . Colon cancer Neg Hx   . Esophageal cancer Neg Hx      Prior to Admission medications   Medication Sig Start Date End Date Taking? Authorizing Provider  aspirin EC 325 MG EC tablet Take 1 tablet (325 mg total) by mouth daily. 11/25/15  Yes Barrett, Erin R, PA-C  atorvastatin (LIPITOR) 80 MG tablet TAKE 1 TABLET DAILY FOR CHOLESTEROL 08/22/17   Liane Comber, NP  blood glucose meter kit and supplies KIT Dispense based on patient and insurance preference. Check blood sugar 1 time daily-[id number]. 12/08/15   [name], MD  isosorbide mononitrate (IMDUR) 30 MG 24 hr tablet TAKE 1 TABLET DAILY 09/04/17   Vicie Mutters, PA-C  lisinopril (PRINIVIL,ZESTRIL) 20 MG tablet TAKE 1 TABLET DAILY FOR BLOOD PRESSURE AND DIABETIC KIDNEY PROTECTION 09/10/17   Liane Comber, NP  metFORMIN (GLUCOPHAGE-XR) 500 MG 24 hr tablet TAKE 2 TABLETS BY MOUTH TWICE A DAY 12/23/16   Liane Comber, NP  mirtazapine (REMERON) 15 MG tablet TAKE 1 TABLET ONE HOUR BEFORE SLEEP 06/12/18   Liane Comber, NP  nitroGLYCERIN (NITROSTAT) 0.4 MG SL tablet Dissolve 1 tablet under tongue every 3 to 5 minutes if needed for ChestPain 10/31/15 01/27/17  [name], MD  ondansetron (ZOFRAN ODT) 8 MG disintegrating tablet Dissolve 1 tablet under tongue every 6 hours for nausea  or vomitting 06/23/18   [name], MD  polyethylene glycol (MIRALAX / Floria Raveling) packet Take 17 g by mouth daily. 10/28/17   Vicie Mutters, PA-C  verapamil (CALAN-SR) 240 MG CR tablet Take 1 tablet daily with food for BP - needs OV before further refills 04/20/18   [name], MD    Physical Exam: Vitals:   06/24/18 1911 06/24/18 2000 06/24/18 2049 06/24/18 2100  BP:  97/80 (!) 81/69 (!) 104/49  Pulse: 63 72 66 64  Resp: 16 19 (!) 24 18  Temp:      TempSrc:      SpO2: 97% 100% 96% 96%    Constitutional: NAD, calm  Eyes: PERTLA, lids and conjunctivae normal ENMT: Mucous membranes are moist. Posterior pharynx clear of any exudate or  lesions.   Neck: normal, supple, no masses, no thyromegaly Respiratory:  no wheezing, no crackles. Normal respiratory effort. No accessory muscle use.  Cardiovascular: S1 & S2 heard, regular rate and rhythm. No extremity edema.  Abdomen: No distension, no tenderness, soft. Bowel sounds active.  Musculoskeletal: no clubbing / cyanosis. No joint deformity upper and lower extremities.   Skin: no significant rashes, lesions, ulcers. Poor turgor. Neurologic: CN 2-12 grossly intact. Sensation intact. Strength 5/5 in all 4 limbs.  Psychiatric: Alert and oriented to person, place, and situation. Very pleasant and cooperative.    Labs on Admission: I have personally reviewed following labs and imaging studies  CBC: Recent Labs  Lab  06/24/18 1739  WBC 3.6*  NEUTROABS 1.6*  HGB 11.0*  HCT 32.6*  MCV 90.8  PLT 878   Basic Metabolic Panel: Recent Labs  Lab 06/24/18 1739  NA 131*  K 3.7  CL 98  CO2 22  GLUCOSE 136*  BUN 13  CREATININE 0.83  CALCIUM 8.4*   GFR: Estimated Creatinine Clearance: 38.1 mL/min (by C-G formula based on SCr of 0.83 mg/dL). Liver Function Tests: Recent Labs  Lab 06/24/18 1739  AST 16  ALT 9  ALKPHOS 33*  BILITOT 1.0  PROT 7.1  ALBUMIN 3.9   Recent Labs  Lab 06/24/18 1739  LIPASE 70*   No results for input(s): AMMONIA in the last 168 hours. Coagulation Profile: No results for input(s): INR, PROTIME in the last 168 hours. Cardiac Enzymes: No results for input(s): CKTOTAL, CKMB, CKMBINDEX, TROPONINI in the last 168 hours. BNP (last 3 results) No results for input(s): PROBNP in the last 8760 hours. HbA1C: No results for input(s): HGBA1C in the last 72 hours. CBG: No results for input(s): GLUCAP in the last 168 hours. Lipid Profile: No results for input(s): CHOL, HDL, LDLCALC, TRIG, CHOLHDL, LDLDIRECT in the last 72 hours. Thyroid Function Tests: No results for input(s): TSH, T4TOTAL, FREET4, T3FREE, THYROIDAB in the last 72 hours. Anemia  Panel: No results for input(s): VITAMINB12, FOLATE, FERRITIN, TIBC, IRON, RETICCTPCT in the last 72 hours. Urine analysis:    Component Value Date/Time   COLORURINE YELLOW 06/24/2018 1839   APPEARANCEUR CLEAR 06/24/2018 1839   LABSPEC 1.008 06/24/2018 1839   PHURINE 6.0 06/24/2018 1839   GLUCOSEU 50 (A) 06/24/2018 1839   HGBUR NEGATIVE 06/24/2018 [place] NEGATIVE 06/24/2018 1839   KETONESUR 20 (A) 06/24/2018 1839   PROTEINUR NEGATIVE 06/24/2018 1839   NITRITE NEGATIVE 06/24/2018 1839   LEUKOCYTESUR MODERATE (A) 06/24/2018 1839   Sepsis Labs: @LABRCNTIP (procalcitonin:4,lacticidven:4) )No results found for this or any previous visit (from the past 240 hour(s)).   Radiological Exams on Admission: Dg Abd Acute 2+v W 1v Chest  Result Date: 06/24/2018 CLINICAL DATA:  Hypotension. EXAM: DG ABDOMEN ACUTE W/ 1V CHEST COMPARISON:  12/19/2015 FINDINGS: The patient is status post prior median sternotomy. The heart size is stable. Aortic calcifications are noted. Chronic lung changes are noted bilaterally without evidence of a focal infiltrate or pneumothorax. There is no large pleural effusion. No acute osseous abnormality. The bowel gas pattern is nonobstructive. There is a large amount of stool throughout the colon. Phleboliths project over the patient's pelvis. Degenerative changes are noted of the lumbar spine. No radiographic evidence for pneumatosis or free air. IMPRESSION: 1. No acute cardiopulmonary process. 2. Nonobstructive bowel gas pattern. 3. Large amount of stool in the colon. Electronically Signed   By: Constance Holster M.D.   On: 06/24/2018 19:24    EKG: Independently reviewed. Sinus rhythm, rate 69, QTc 424 ms.   Assessment/Plan   1. Hypotension  - Patient presents from PCP office where she was seen for 3-4 wks of N/V and found to have SBP in low 80's - She is afebrile with leukopenia and possible UTI, hypovolemic in setting of recent N/V, and with no bleeding, no  headache or chest pain  - She does not appear septic and this is likely secondary to hypovolemia in setting of N/V  - BP improved after 1.5 liters NS in ED  - Blood and urine cultures collected in ED, empiric Rocephin given for possible UTI, and BP improved after 1.5 liters NS   -  Continue fluid-boluses as needed for BP, mindful of chronic systolic CHF, continue empiric [place], hold antihypertensives   2. Nausea with vomiting  - Presents with 3-4 wks of nausea and vomiting, unable to keep food down and only tolerated liquids intermittently  - Abd is soft and non-tender on exam, she denies abd pain or dysphagia, UA suggests possible infection, and KUB is negative for obstruction but notable for heavy colonic stool burden  - Continue empiric antibiotic while following cultures, start bowel regimen, continue IVF hydration and antiemetics, advance diet as she improves    3. Leukopenia  - WBC is 3,600 on admission, previously normal  - She is being treated for possible UTI; nutritional deficiency also considered   - Continue empiric antibiotic, repeat CBC in am    4. Type II DM  - No recent A1c available  - Use low-intensity SSI with Novolog while in hospital    5. CAD; history of ischemic cardiomyopathy  - No anginal complaints, appears hypovolemic on admission  - Cautious IVF hydration, monitor daily wts and I/O's    6. Hyponatremia  - Serum sodium is 131 on admission in setting of hypovolemia  - She has received 1.5 liter NS in ED  - Repeat chem panel in am     PPE: Mask, face shield. Patient wearing mask.  DVT prophylaxis: Lovenox  Code Status: Full  Family Communication: Discussed with patient  Consults called: none Admission status: Observation     Vianne Bulls, MD [facility] Pager [phone]  If 7PM-7AM, please contact night-coverage [email or web address] Password [facility]  06/24/2018, 9:26 PM

## 2018-06-24 NOTE — Patient Instructions (Signed)

## 2018-06-25 ENCOUNTER — Inpatient Hospital Stay (HOSPITAL_COMMUNITY): Payer: Medicare Other

## 2018-06-25 DIAGNOSIS — I351 Nonrheumatic aortic (valve) insufficiency: Secondary | ICD-10-CM | POA: Diagnosis not present

## 2018-06-25 DIAGNOSIS — Z1159 Encounter for screening for other viral diseases: Secondary | ICD-10-CM | POA: Diagnosis not present

## 2018-06-25 DIAGNOSIS — F17290 Nicotine dependence, other tobacco product, uncomplicated: Secondary | ICD-10-CM | POA: Diagnosis present

## 2018-06-25 DIAGNOSIS — F0390 Unspecified dementia without behavioral disturbance: Secondary | ICD-10-CM | POA: Diagnosis present

## 2018-06-25 DIAGNOSIS — D72819 Decreased white blood cell count, unspecified: Secondary | ICD-10-CM | POA: Diagnosis present

## 2018-06-25 DIAGNOSIS — E1143 Type 2 diabetes mellitus with diabetic autonomic (poly)neuropathy: Secondary | ICD-10-CM | POA: Diagnosis present

## 2018-06-25 DIAGNOSIS — E876 Hypokalemia: Secondary | ICD-10-CM | POA: Diagnosis present

## 2018-06-25 DIAGNOSIS — Z01818 Encounter for other preprocedural examination: Secondary | ICD-10-CM | POA: Diagnosis not present

## 2018-06-25 DIAGNOSIS — I25708 Atherosclerosis of coronary artery bypass graft(s), unspecified, with other forms of angina pectoris: Secondary | ICD-10-CM | POA: Diagnosis not present

## 2018-06-25 DIAGNOSIS — K5900 Constipation, unspecified: Secondary | ICD-10-CM | POA: Diagnosis present

## 2018-06-25 DIAGNOSIS — E785 Hyperlipidemia, unspecified: Secondary | ICD-10-CM | POA: Diagnosis present

## 2018-06-25 DIAGNOSIS — K0889 Other specified disorders of teeth and supporting structures: Secondary | ICD-10-CM | POA: Diagnosis present

## 2018-06-25 DIAGNOSIS — E039 Hypothyroidism, unspecified: Secondary | ICD-10-CM | POA: Diagnosis present

## 2018-06-25 DIAGNOSIS — N189 Chronic kidney disease, unspecified: Secondary | ICD-10-CM | POA: Diagnosis present

## 2018-06-25 DIAGNOSIS — E861 Hypovolemia: Secondary | ICD-10-CM | POA: Diagnosis present

## 2018-06-25 DIAGNOSIS — D649 Anemia, unspecified: Secondary | ICD-10-CM | POA: Diagnosis present

## 2018-06-25 DIAGNOSIS — R8281 Pyuria: Secondary | ICD-10-CM | POA: Diagnosis present

## 2018-06-25 DIAGNOSIS — I9589 Other hypotension: Secondary | ICD-10-CM | POA: Diagnosis present

## 2018-06-25 DIAGNOSIS — K219 Gastro-esophageal reflux disease without esophagitis: Secondary | ICD-10-CM | POA: Diagnosis present

## 2018-06-25 DIAGNOSIS — K8000 Calculus of gallbladder with acute cholecystitis without obstruction: Secondary | ICD-10-CM | POA: Diagnosis present

## 2018-06-25 DIAGNOSIS — K3184 Gastroparesis: Secondary | ICD-10-CM | POA: Diagnosis present

## 2018-06-25 DIAGNOSIS — E1122 Type 2 diabetes mellitus with diabetic chronic kidney disease: Secondary | ICD-10-CM | POA: Diagnosis present

## 2018-06-25 DIAGNOSIS — Z01810 Encounter for preprocedural cardiovascular examination: Secondary | ICD-10-CM | POA: Diagnosis not present

## 2018-06-25 DIAGNOSIS — I255 Ischemic cardiomyopathy: Secondary | ICD-10-CM | POA: Diagnosis present

## 2018-06-25 DIAGNOSIS — E871 Hypo-osmolality and hyponatremia: Secondary | ICD-10-CM | POA: Diagnosis present

## 2018-06-25 DIAGNOSIS — I2510 Atherosclerotic heart disease of native coronary artery without angina pectoris: Secondary | ICD-10-CM | POA: Diagnosis present

## 2018-06-25 DIAGNOSIS — I129 Hypertensive chronic kidney disease with stage 1 through stage 4 chronic kidney disease, or unspecified chronic kidney disease: Secondary | ICD-10-CM | POA: Diagnosis present

## 2018-06-25 LAB — CBC WITH DIFFERENTIAL/PLATELET
Abs Immature Granulocytes: 0.05 10*3/uL (ref 0.00–0.07)
Basophils Absolute: 0 10*3/uL (ref 0.0–0.1)
Basophils Relative: 0 %
Eosinophils Absolute: 0 10*3/uL (ref 0.0–0.5)
Eosinophils Relative: 0 %
HCT: 27.9 % — ABNORMAL LOW (ref 36.0–46.0)
Hemoglobin: 9.3 g/dL — ABNORMAL LOW (ref 12.0–15.0)
Immature Granulocytes: 2 %
Lymphocytes Relative: 37 %
Lymphs Abs: 1.2 10*3/uL (ref 0.7–4.0)
MCH: 30.7 pg (ref 26.0–34.0)
MCHC: 33.3 g/dL (ref 30.0–36.0)
MCV: 92.1 fL (ref 80.0–100.0)
Monocytes Absolute: 0.7 10*3/uL (ref 0.1–1.0)
Monocytes Relative: 22 %
Neutro Abs: 1.3 10*3/uL — ABNORMAL LOW (ref 1.7–7.7)
Neutrophils Relative %: 39 %
Platelets: 159 10*3/uL (ref 150–400)
RBC: 3.03 MIL/uL — ABNORMAL LOW (ref 3.87–5.11)
RDW: 13.9 % (ref 11.5–15.5)
WBC: 3.3 10*3/uL — ABNORMAL LOW (ref 4.0–10.5)
nRBC: 0 % (ref 0.0–0.2)

## 2018-06-25 LAB — GLUCOSE, CAPILLARY
Glucose-Capillary: 114 mg/dL — ABNORMAL HIGH (ref 70–99)
Glucose-Capillary: 92 mg/dL (ref 70–99)
Glucose-Capillary: 98 mg/dL (ref 70–99)
Glucose-Capillary: 88 mg/dL (ref 70–99)
Glucose-Capillary: 76 mg/dL (ref 70–99)
Glucose-Capillary: 95 mg/dL (ref 70–99)

## 2018-06-25 LAB — COMPREHENSIVE METABOLIC PANEL
ALT: 9 U/L (ref 0–44)
AST: 13 U/L — ABNORMAL LOW (ref 15–41)
Albumin: 3.5 g/dL (ref 3.5–5.0)
Alkaline Phosphatase: 31 U/L — ABNORMAL LOW (ref 38–126)
Anion gap: 10 (ref 5–15)
BUN: 11 mg/dL (ref 8–23)
CO2: 23 mmol/L (ref 22–32)
Calcium: 8.1 mg/dL — ABNORMAL LOW (ref 8.9–10.3)
Chloride: 102 mmol/L (ref 98–111)
Creatinine, Ser: 0.76 mg/dL (ref 0.44–1.00)
GFR calc Af Amer: >60 mL/min (ref >60)
GFR calc non Af Amer: >60 mL/min (ref >60)
Glucose, Bld: 103 mg/dL — ABNORMAL HIGH (ref 70–99)
Potassium: 3.6 mmol/L (ref 3.5–5.1)
Sodium: 135 mmol/L (ref 135–145)
Total Bilirubin: 1 mg/dL (ref 0.3–1.2)
Total Protein: 6.4 g/dL — ABNORMAL LOW (ref 6.5–8.1)

## 2018-06-25 LAB — HEMOGLOBIN A1C
Hgb A1c MFr Bld: 6.1 % — ABNORMAL HIGH (ref 4.8–5.6)
Mean Plasma Glucose: 128.37 mg/dL

## 2018-06-25 LAB — T4, FREE: Free T4: 1.25 ng/dL — ABNORMAL HIGH (ref 0.61–1.12)

## 2018-06-25 LAB — CORTISOL: Cortisol, Plasma: 13.7 ug/dL

## 2018-06-25 LAB — FOLATE: Folate: 3.3 ng/mL — ABNORMAL LOW

## 2018-06-25 MED ORDER — ENSURE ENLIVE PO LIQD: 237.0000 mL | Freq: Two times a day (BID) | ORAL | Status: DC

## 2018-06-25 MED ORDER — ASPIRIN EC 325 MG PO TBEC
325.0000 mg | DELAYED_RELEASE_TABLET | Freq: Every day | ORAL | Status: DC
  Administered 2018-06-27 – 2018-07-01 (×5): 325 mg via ORAL
  Filled 2018-06-25 (×6): qty 1

## 2018-06-25 MED ORDER — MIRTAZAPINE 15 MG PO TABS
15.0000 mg | ORAL_TABLET | Freq: Every day | ORAL | Status: DC
  Administered 2018-06-25 – 2018-06-30 (×6): 15 mg via ORAL
  Filled 2018-06-25 (×6): qty 1

## 2018-06-25 MED ORDER — IOHEXOL 300 MG/ML  SOLN
75.0000 mL | Freq: Once | INTRAMUSCULAR | Status: AC | PRN
  Administered 2018-06-25: 75 mL via INTRAVENOUS

## 2018-06-25 MED ORDER — ATORVASTATIN CALCIUM 40 MG PO TABS
80.0000 mg | ORAL_TABLET | Freq: Every day | ORAL | Status: DC
  Administered 2018-06-27 – 2018-06-30 (×4): 80 mg via ORAL
  Filled 2018-06-25 (×4): qty 2

## 2018-06-25 MED ORDER — FOLIC ACID 1 MG PO TABS
1.0000 mg | ORAL_TABLET | Freq: Every day | ORAL | Status: DC
  Administered 2018-06-25 – 2018-07-01 (×6): 1 mg via ORAL
  Filled 2018-06-25 (×7): qty 1

## 2018-06-25 NOTE — Progress Notes (Signed)
PROGRESS NOTE    [name] [name]  UYQ:[id number] DOB: 1931/04/03 DOA: 06/24/2018 PCP: [name], MD     Brief Narrative:  [name] [name] is a 85 y.o. female with medical history significant for dementia, coronary artery disease, ischemic cardiomyopathy, hypothyroidism, and type 2 diabetes mellitus, now presenting to emergency department for evaluation of nausea, vomiting, and low blood pressure.  Patient has reportedly been experiencing nausea with nonbloody vomiting for the past 3 to 4 weeks, has been able to tolerate cola, but has vomited anytime she attempts to drink water or eat.  She has not been taking her medications "for a long time" per report of her son.  She was taken to see her PCP for evaluation of these complaints, was noted to have a blood pressure of 84/52, and was directed to the ED for further evaluation.  She was given 250 cc of IV fluids with EMS prior to arrival in the ED. patient denies abdominal pain, diarrhea, fevers, or chills.  She denies any dysphasia. KUB features a nonobstructive bowel gas pattern with large amount of stool in the colon.   New events last 24 hours / Subjective: Some nausea which is well controlled with medications, no vomiting since admission but has not really tried any food yet.   Extended conversation with son over the phone; patient has had progressive dementia over the past year.  He believes the patient will say nearly anything in order for her to get out of the hospital.  Per son, patient has had some dental complaints as well as intractable nausea and vomiting over the past 3 to 4 weeks.  She has refused to be evaluated by a physician, until yesterday.  PCP evaluation noted patient's blood pressure to be low which prompted transfer to the emergency department for further evaluation and treatment at that time.  Patient currently resides at home with spouse.  Assessment & Plan:   Principal Problem:   Hypotension due to hypovolemia Active  Problems:   Essential hypertension   Hypothyroidism   Coronary artery disease   Mild dementia (HCC)   Type 2 diabetes mellitus (HCC)   Nausea & vomiting   Leukopenia   History of ischemic cardiomyopathy   Hyponatremia   Hypotension  -Patient presents from PCP office where she was seen for 3-4 wks of N/V and found to have SBP in low 80's.  This is likely secondary to hypovolemia in setting of persistent nausea and vomiting.  Blood pressure has improved after IV fluids -Hold lisinopril, verapamil, imdur until BP further stabilized   Intractable nausea with vomiting  -KUB is negative for obstruction but notable for heavy colonic stool burden  -IVF. Bowel regimen ordered, antiemetics, advance diet as tolerated -Lipase 70 -Check RUQ [place]   Type II DM  -Hemoglobin A1c 6.9 in October 2019.  Repeat today -Hold metformin  -Sliding scale insulin  CAD; history of ischemic cardiomyopathy, s/p CABG  -Stable. Continue aspirin and Lipitor  Hyponatremia  -Improved with IV fluids  History of hypothyroidism -Free T4 1.25, TSH 0.288. Per son, patient currently not on any thyroid medications   Dementia -Follows Neurology Dr. Delice Lesch. Slowly progressed over the past year    Dental pain -Check orthopantogram  Pyuria -Initially concerned about UTI, UA with moderate leukocytes and rare bacteria, 6-10 WBC. No complaints of dysuria. Stop rocephin.    DVT prophylaxis: Lovenox Code Status: Full Family Communication: Son over the phone Disposition Plan: Pending further work up, IVF, PO trial, improvement in  nausea, vomiting   Consultants:   None  Procedures:   None   Antimicrobials:  Anti-infectives (From admission, onward)   Start     Dose/Rate Route Frequency Ordered Stop   06/25/18 2000  cefTRIAXone (ROCEPHIN) 1 g in sodium chloride 0.9 % 100 mL IVPB     1 g 200 mL/hr over 30 Minutes Intravenous Every 24 hours 06/24/18 2154     06/24/18 1945  cefTRIAXone (ROCEPHIN) 1 g in  sodium chloride 0.9 % 100 mL IVPB     1 g 200 mL/hr over 30 Minutes Intravenous  Once 06/24/18 1944 06/24/18 2045        Objective: Vitals:   06/24/18 2100 06/24/18 2130 06/24/18 2221 06/25/18 0429  BP: (!) 104/49 114/66 (!) 110/49 (!) 91/54  Pulse: 64 78 65 68  Resp: 18 17 19 19   Temp:   98.2 F (36.8 C) 98 F (36.7 C)  TempSrc:   Oral Oral  SpO2: 96% 97% 99% 98%  Weight:    53.6 kg    Intake/Output Summary (Last 24 hours) at 06/25/2018 1230 Last data filed at 06/25/2018 0500 Gross per 24 hour  Intake 1162.91 ml  Output -  Net 1162.91 ml   Filed Weights   06/25/18 0429  Weight: 53.6 kg    Examination:  General exam: Appears calm and comfortable  Respiratory system: Clear to auscultation. Respiratory effort normal. Cardiovascular system: S1 & S2 heard, RRR. No JVD, murmurs, rubs, gallops or clicks. No pedal edema. Gastrointestinal system: Abdomen is nondistended, soft and tender to palpation right upper quadrant. No organomegaly or masses felt. Normal bowel sounds heard. Central nervous system: Alert and oriented. No focal neurological deficits. Extremities: Symmetric 5 x 5 power. Skin: No rashes, lesions or ulcers Psychiatry: Judgement and insight appear stable, has history of dementia  Data Reviewed: I have personally reviewed following labs and imaging studies  CBC: Recent Labs  Lab 06/24/18 1739 06/25/18 0526  WBC 3.6* 3.3*  NEUTROABS 1.6* 1.3*  HGB 11.0* 9.3*  HCT 32.6* 27.9*  MCV 90.8 92.1  PLT 182 124   Basic Metabolic Panel: Recent Labs  Lab 06/24/18 1739 06/25/18 0526  NA 131* 135  K 3.7 3.6  CL 98 102  CO2 22 23  GLUCOSE 136* 103*  BUN 13 11  CREATININE 0.83 0.76  CALCIUM 8.4* 8.1*   GFR: Estimated Creatinine Clearance: 39.5 mL/min (by C-G formula based on SCr of 0.76 mg/dL). Liver Function Tests: Recent Labs  Lab 06/24/18 1739 06/25/18 0526  AST 16 13*  ALT 9 9  ALKPHOS 33* 31*  BILITOT 1.0 1.0  PROT 7.1 6.4*  ALBUMIN 3.9  3.5   Recent Labs  Lab 06/24/18 1739  LIPASE 70*   No results for input(s): AMMONIA in the last 168 hours. Coagulation Profile: No results for input(s): INR, PROTIME in the last 168 hours. Cardiac Enzymes: No results for input(s): CKTOTAL, CKMB, CKMBINDEX, TROPONINI in the last 168 hours. BNP (last 3 results) No results for input(s): PROBNP in the last 8760 hours. HbA1C: No results for input(s): HGBA1C in the last 72 hours. CBG: Recent Labs  Lab 06/24/18 2225 06/25/18 0436 06/25/18 0810  GLUCAP 114* 92 98   Lipid Profile: No results for input(s): CHOL, HDL, LDLCALC, TRIG, CHOLHDL, LDLDIRECT in the last 72 hours. Thyroid Function Tests: Recent Labs    06/24/18 2234 06/25/18 0526  TSH 0.288*  --   FREET4  --  1.25*   Anemia Panel: Recent Labs    06/24/18  2234  VITAMINB12 300  FOLATE 3.3*  FERRITIN 244  TIBC 198*  IRON 45  RETICCTPCT 0.7   Sepsis Labs: Recent Labs  Lab 06/24/18 2052  LATICACIDVEN 1.0    Recent Results (from the past 240 hour(s))  SARS Coronavirus 2 (CEPHEID - Performed in [facility] hospital lab), Hosp Order     Status: None   Collection Time: 06/24/18  9:26 PM   Specimen: Nasopharyngeal Swab  Result Value Ref Range Status   SARS Coronavirus 2 NEGATIVE NEGATIVE Final    Comment: (NOTE) If result is NEGATIVE SARS-CoV-2 target nucleic acids are NOT DETECTED. The SARS-CoV-2 RNA is generally detectable in upper and lower  respiratory specimens during the acute phase of infection. The lowest  concentration of SARS-CoV-2 viral copies this assay can detect is 250  copies / mL. A negative result does not preclude SARS-CoV-2 infection  and should not be used as the sole basis for treatment or other  patient management decisions.  A negative result may occur with  improper specimen collection / handling, submission of specimen other  than nasopharyngeal swab, presence of viral mutation(s) within the  areas targeted by this assay, and  inadequate number of viral copies  (<250 copies / mL). A negative result must be combined with clinical  observations, patient history, and epidemiological information. If result is POSITIVE SARS-CoV-2 target nucleic acids are DETECTED. The SARS-CoV-2 RNA is generally detectable in upper and lower  respiratory specimens dur ing the acute phase of infection.  Positive  results are indicative of active infection with SARS-CoV-2.  Clinical  correlation with patient history and other diagnostic information is  necessary to determine patient infection status.  Positive results do  not rule out bacterial infection or co-infection with other viruses. If result is PRESUMPTIVE POSTIVE SARS-CoV-2 nucleic acids MAY BE PRESENT.   A presumptive positive result was obtained on the submitted specimen  and confirmed on repeat testing.  While 2019 novel coronavirus  (SARS-CoV-2) nucleic acids may be present in the submitted sample  additional confirmatory testing may be necessary for epidemiological  and / or clinical management purposes  to differentiate between  SARS-CoV-2 and other Sarbecovirus currently known to infect humans.  If clinically indicated additional testing with an alternate test  methodology [phone]) is advised. The SARS-CoV-2 RNA is generally  detectable in upper and lower respiratory sp ecimens during the acute  phase of infection. The expected result is Negative. Fact Sheet for Patients:  StrictlyIdeas.no Fact Sheet for Healthcare Providers: BankingDealers.co.za This test is not yet approved or cleared by the [place] FDA and has been authorized for detection and/or diagnosis of SARS-CoV-2 by FDA under an Emergency Use Authorization (EUA).  This EUA will remain in effect (meaning this test can be used) for the duration of the COVID-19 declaration under Section 564(b)(1) of the Act, 21 U.S.C. section 360bbb-3(b)(1), unless the  authorization is terminated or revoked sooner. Performed at [facility], [place] [address] [place], [place] [address]        Radiology Studies: Dg Abd Acute 2+v W 1v Chest  Result Date: 06/24/2018 CLINICAL DATA:  Hypotension. EXAM: DG ABDOMEN ACUTE W/ 1V CHEST COMPARISON:  12/19/2015 FINDINGS: The patient is status post prior median sternotomy. The heart size is stable. Aortic calcifications are noted. Chronic lung changes are noted bilaterally without evidence of a focal infiltrate or pneumothorax. There is no large pleural effusion. No acute osseous abnormality. The bowel gas pattern is nonobstructive. There is a large  amount of stool throughout the colon. Phleboliths project over the patient's pelvis. Degenerative changes are noted of the lumbar spine. No radiographic evidence for pneumatosis or free air. IMPRESSION: 1. No acute cardiopulmonary process. 2. Nonobstructive bowel gas pattern. 3. Large amount of stool in the colon. Electronically Signed   By: Constance Holster M.D.   On: 06/24/2018 19:24      Scheduled Meds: . enoxaparin (LOVENOX) injection  30 mg Subcutaneous QHS  . folic acid  1 mg Oral Daily  . insulin aspart  0-9 Units Subcutaneous Q4H  . senna  1 tablet Oral BID  . sodium chloride flush  3 mL Intravenous Q12H  . sodium chloride flush  3 mL Intravenous Q12H   Continuous Infusions: . sodium chloride 10 mL/hr at 06/25/18 0500  . cefTRIAXone (ROCEPHIN)  IV       LOS: 0 days    Time spent: 45 minutes   Dessa Phi, DO [facility] [email or web address] 06/25/2018, 12:30 PM

## 2018-06-25 NOTE — Progress Notes (Signed)
Initial Nutrition Assessment   INTERVENTION:   Provide Ensure Enlive po BID, each supplement provides 350 kcal and 20 grams of protein  NUTRITION DIAGNOSIS:   Inadequate oral intake related to nausea, vomiting as evidenced by per patient/family report.  GOAL:   Patient will meet greater than or equal to 90% of their needs  MONITOR:   PO intake, Supplement acceptance, Labs, Weight trends, I & O's  REASON FOR ASSESSMENT:   Malnutrition Screening Tool    ASSESSMENT:   85 y.o. female with medical history significant for dementia, coronary artery disease, ischemic cardiomyopathy, hypothyroidism, and type 2 diabetes mellitus, now presenting to emergency department for evaluation of nausea, vomiting, and low blood pressure.  Patient has reportedly been experiencing nausea with nonbloody vomiting for the past 3 to 4 weeks, has been able to tolerate cola, but has vomited anytime she attempts to drink water or eat.  **RD working remotely**  Pt with dementia. Per family, not a reliable historian. Pt has had N/V for 3-4 weeks PTA, mainly tolerating soda but not water or food. Pt has not been taking medications either. Pt currently on full liquids. Has not taken in much. Having some nausea. Will order Ensure supplements since PO intakes have been poor for almost a month.   Per weight records, pt weighed 109 lbs at admission. Pt has lost 28 lbs since October 2019 (20% wt loss x 8 months, significant for time frame).  Medications: Folic acid tablet daily, Remeron tablet daily, IV Zofran PRN  Labs reviewed: CBGs: 88-98  NUTRITION - FOCUSED PHYSICAL EXAM:  Unable to perform per department requirements to work remotely.  Diet Order:   Diet Order            Diet full liquid Room service appropriate? Yes; Fluid consistency: Thin  Diet effective now              EDUCATION NEEDS:   No education needs have been identified at this time  Skin:  Skin Assessment: Reviewed RN  Assessment  Last BM:  PTA  Height:   Ht Readings from Last 1 Encounters:  06/24/18 5\' 1"  (1.549 m)    Weight:   Wt Readings from Last 1 Encounters:  06/25/18 53.6 kg    Ideal Body Weight:  47.7 kg  BMI:  Body mass index is 22.33 kg/m.  Estimated Nutritional Needs:   Kcal:  1350-1550  Protein:  60-70g  Fluid:  1.5L/day  Clayton Bibles, MS, RD, LDN [place] [facility] Pager: [phone] After Hours Pager: [phone]

## 2018-06-26 ENCOUNTER — Encounter (HOSPITAL_COMMUNITY): Payer: Medicare Other

## 2018-06-26 ENCOUNTER — Encounter (HOSPITAL_COMMUNITY): Payer: Self-pay | Admitting: Cardiology

## 2018-06-26 ENCOUNTER — Inpatient Hospital Stay (HOSPITAL_COMMUNITY): Payer: Medicare Other

## 2018-06-26 DIAGNOSIS — Z01818 Encounter for other preprocedural examination: Secondary | ICD-10-CM

## 2018-06-26 DIAGNOSIS — E861 Hypovolemia: Secondary | ICD-10-CM

## 2018-06-26 DIAGNOSIS — I351 Nonrheumatic aortic (valve) insufficiency: Secondary | ICD-10-CM

## 2018-06-26 DIAGNOSIS — I9589 Other hypotension: Principal | ICD-10-CM

## 2018-06-26 LAB — BASIC METABOLIC PANEL
Anion gap: 12 (ref 5–15)
BUN: 8 mg/dL (ref 8–23)
CO2: 21 mmol/L — ABNORMAL LOW (ref 22–32)
Calcium: 8.4 mg/dL — ABNORMAL LOW (ref 8.9–10.3)
Chloride: 103 mmol/L (ref 98–111)
Creatinine, Ser: 0.72 mg/dL (ref 0.44–1.00)
GFR calc Af Amer: >60 mL/min (ref >60)
GFR calc non Af Amer: >60 mL/min (ref >60)
Glucose, Bld: 73 mg/dL (ref 70–99)
Potassium: 3.6 mmol/L (ref 3.5–5.1)
Sodium: 136 mmol/L (ref 135–145)

## 2018-06-26 LAB — GLUCOSE, CAPILLARY
Glucose-Capillary: 103 mg/dL — ABNORMAL HIGH (ref 70–99)
Glucose-Capillary: 158 mg/dL — ABNORMAL HIGH (ref 70–99)
Glucose-Capillary: 198 mg/dL — ABNORMAL HIGH (ref 70–99)
Glucose-Capillary: 71 mg/dL (ref 70–99)
Glucose-Capillary: 73 mg/dL (ref 70–99)
Glucose-Capillary: 74 mg/dL (ref 70–99)

## 2018-06-26 LAB — ECHOCARDIOGRAM COMPLETE
Height: 61 in
Weight: 1664.91 oz

## 2018-06-26 LAB — HEPATIC FUNCTION PANEL
ALT: 9 U/L (ref 0–44)
AST: 14 U/L — ABNORMAL LOW (ref 15–41)
Albumin: 3.4 g/dL — ABNORMAL LOW (ref 3.5–5.0)
Alkaline Phosphatase: 31 U/L — ABNORMAL LOW (ref 38–126)
Bilirubin, Direct: 0.2 mg/dL (ref 0.0–0.2)
Indirect Bilirubin: 1.1 mg/dL — ABNORMAL HIGH (ref 0.3–0.9)
Total Bilirubin: 1.3 mg/dL — ABNORMAL HIGH (ref 0.3–1.2)
Total Protein: 6.2 g/dL — ABNORMAL LOW (ref 6.5–8.1)

## 2018-06-26 LAB — CBC
HCT: 27.6 % — ABNORMAL LOW (ref 36.0–46.0)
Hemoglobin: 9.2 g/dL — ABNORMAL LOW (ref 12.0–15.0)
MCH: 30.8 pg (ref 26.0–34.0)
MCHC: 33.3 g/dL (ref 30.0–36.0)
MCV: 92.3 fL (ref 80.0–100.0)
Platelets: 159 10*3/uL (ref 150–400)
RBC: 2.99 MIL/uL — ABNORMAL LOW (ref 3.87–5.11)
RDW: 14.1 % (ref 11.5–15.5)
WBC: 4 10*3/uL (ref 4.0–10.5)
nRBC: 0 % (ref 0.0–0.2)

## 2018-06-26 LAB — URINE CULTURE: Culture: <10000 — AB

## 2018-06-26 MED ORDER — METRONIDAZOLE IN NACL 5-0.79 MG/ML-% IV SOLN: 500.0000 mg | Freq: Once | INTRAVENOUS | Status: DC

## 2018-06-26 MED ORDER — SODIUM CHLORIDE 0.9 % IV SOLN
2.0000 g | INTRAVENOUS | Status: DC
  Administered 2018-06-26: 2 g via INTRAVENOUS
  Filled 2018-06-26: qty 20
  Filled 2018-06-26: qty 2

## 2018-06-26 MED ORDER — DEXTROSE-NACL 5-0.45 % IV SOLN
INTRAVENOUS | Status: DC
  Administered 2018-06-26 – 2018-07-01 (×6): via INTRAVENOUS

## 2018-06-26 MED ORDER — METRONIDAZOLE IN NACL 5-0.79 MG/ML-% IV SOLN
500.0000 mg | Freq: Three times a day (TID) | INTRAVENOUS | Status: DC
  Administered 2018-06-27: 500 mg via INTRAVENOUS
  Filled 2018-06-26: qty 100

## 2018-06-26 NOTE — Progress Notes (Signed)
  Echocardiogram 2D Echocardiogram has been performed.  [name] [name] 06/26/2018, 4:36 PM

## 2018-06-26 NOTE — Progress Notes (Addendum)
Received stat order for carotid duplex. Paged Dr. Radford Pax at 12:50 regarding indication for stat order.  Patient is currently in nuc med and test cannot be performed at this time. 1:00 PM  Spoke with Cecilie Kicks at 1:22 PM - stated test is to be done asap for surgical clearance.   Vascular lab June Leap, BS, RDMS, RVT

## 2018-06-26 NOTE — Progress Notes (Signed)
  Echocardiogram 2D Echocardiogram has been attempted. Patient left for Nuc Med. Nurse stated to reattempt in 3 hrs.  Randa Lynn Sherwood Castilla 06/26/2018, 12:48 PM

## 2018-06-26 NOTE — Progress Notes (Addendum)
PROGRESS NOTE    AUBERY DATE  JJK:[id number] DOB: 12/26/1931 DOA: 06/24/2018 PCP: [name], MD     Brief Narrative:  [name] [name] is a 85 y.o. female with medical history significant for dementia, coronary artery disease, ischemic cardiomyopathy, hypothyroidism, and type 2 diabetes mellitus, now presenting to emergency department for evaluation of nausea, vomiting, and low blood pressure.  Patient has reportedly been experiencing nausea with nonbloody vomiting for the past 3 to 4 weeks, has been able to tolerate cola, but has vomited anytime she attempts to drink water or eat.  She has not been taking her medications "for a long time" per report of her son.  She was taken to see her PCP for evaluation of these complaints, was noted to have a blood pressure of 84/52, and was directed to the ED for further evaluation.  She was given 250 cc of IV fluids with EMS prior to arrival in the ED. patient denies abdominal pain, diarrhea, fevers, or chills.  She denies any dysphasia. KUB features a nonobstructive bowel gas pattern with large amount of stool in the colon.   New events last 24 hours / Subjective: No nausea today. States she has no pain, although does grimace with RUQ palpation. Per son yesterday, patient has dementia and will say anything in order to leave the hospital.   Assessment & Plan:   Principal Problem:   Hypotension due to hypovolemia Active Problems:   Essential hypertension   Hypothyroidism   Coronary artery disease   Mild dementia (HCC)   Type 2 diabetes mellitus (HCC)   Nausea & vomiting   Leukopenia   History of ischemic cardiomyopathy   Hyponatremia   Acute cholecystitis with intractable nausea with vomiting  -KUB is negative for obstruction but notable for heavy colonic stool burden  -RUQ US revealed cholelithiasis with gallbladder wall thickening concerning for acute cholecystitis -General surgery consulted -Cardiology consulted for surgical clearance    Hypotension  -Patient presents from PCP office where she was seen for 3-4 wks of N/V and found to have SBP in low 80's.  This is likely secondary to hypovolemia in setting of persistent nausea and vomiting.  Blood pressure has improved after IV fluids -Hold lisinopril, verapamil, imdur until BP further stabilized  -BP this morning low but stable 94/69  Type II DM  -Hemoglobin A1c 6.1  -Hold metformin  -Sliding scale insulin  CAD; history of ischemic cardiomyopathy, s/p CABG  -Stable. Continue aspirin and Lipitor  Hyponatremia  -Resolved with IV fluids  History of hypothyroidism -Free T4 1.25, TSH 0.288. Patient does not remember if she is on thyroid medications. Per chart review, it looks like it was discontinued back in 2015. Patient also unsure if she takes biotin supplements. Would repeat labs as outpatient in several weeks.   Dementia -Follows Neurology Dr. Delice Lesch. Slowly progressed over the past year    Dental pain -CT maxillofacial reveals periapical lucency right central incisor, no subperiosteal abscess. Follow up with primary dentist outpatient   Pyuria -Initially concerned about UTI, UA with moderate leukocytes and rare bacteria, 6-10 WBC. No complaints of dysuria. Stop rocephin.    DVT prophylaxis: Lovenox Code Status: Full Family Communication: Called son with update  Disposition Plan: Pending surgical and cardiology consultation   Consultants:   General surgery  Cardiology  Procedures:   None   Antimicrobials:  Anti-infectives (From admission, onward)   Start     Dose/Rate Route Frequency Ordered Stop   06/25/18 2000  cefTRIAXone (ROCEPHIN)  1 g in sodium chloride 0.9 % 100 mL IVPB  Status:  Discontinued     1 g 200 mL/hr over 30 Minutes Intravenous Every 24 hours 06/24/18 2154 06/25/18 1253   06/24/18 1945  cefTRIAXone (ROCEPHIN) 1 g in sodium chloride 0.9 % 100 mL IVPB     1 g 200 mL/hr over 30 Minutes Intravenous  Once 06/24/18 1944 06/24/18  2045       Objective: Vitals:   06/25/18 0429 06/25/18 2135 06/25/18 2137 06/26/18 0658  BP: (!) 91/54 (!) 94/49 94/69   Pulse: 68  60   Resp: 19  16   Temp: 98 F (36.7 C)  98.2 F (36.8 C)   TempSrc: Oral     SpO2: 98%  98%   Weight: 53.6 kg   47.2 kg   No intake or output data in the 24 hours ending 06/26/18 0935 Filed Weights   06/25/18 0429 06/26/18 0658  Weight: 53.6 kg 47.2 kg    Examination: General exam: Appears calm and comfortable  Respiratory system: Clear to auscultation. Respiratory effort normal. Cardiovascular system: S1 & S2 heard, RRR. +murmur Gastrointestinal system: Abdomen is nondistended, soft and TTP RUQ  Central nervous system: Alert Extremities: Symmetric  Skin: No rashes, lesions or ulcers Psychiatry: Dementia   Data Reviewed: I have personally reviewed following labs and imaging studies  CBC: Recent Labs  Lab 06/24/18 1739 06/25/18 0526 06/26/18 0520  WBC 3.6* 3.3* 4.0  NEUTROABS 1.6* 1.3*  --   HGB 11.0* 9.3* 9.2*  HCT 32.6* 27.9* 27.6*  MCV 90.8 92.1 92.3  PLT 182 159 283   Basic Metabolic Panel: Recent Labs  Lab 06/24/18 1739 06/25/18 0526 06/26/18 0520  NA 131* 135 136  K 3.7 3.6 3.6  CL 98 102 103  CO2 22 23 21*  GLUCOSE 136* 103* 73  BUN 13 11 8   CREATININE 0.83 0.76 0.72  CALCIUM 8.4* 8.1* 8.4*   GFR: Estimated Creatinine Clearance: 39 mL/min (by C-G formula based on SCr of 0.72 mg/dL). Liver Function Tests: Recent Labs  Lab 06/24/18 1739 06/25/18 0526 06/26/18 0520  AST 16 13* 14*  ALT 9 9 9   ALKPHOS 33* 31* 31*  BILITOT 1.0 1.0 1.3*  PROT 7.1 6.4* 6.2*  ALBUMIN 3.9 3.5 3.4*   Recent Labs  Lab 06/24/18 1739  LIPASE 70*   No results for input(s): AMMONIA in the last 168 hours. Coagulation Profile: No results for input(s): INR, PROTIME in the last 168 hours. Cardiac Enzymes: No results for input(s): CKTOTAL, CKMB, CKMBINDEX, TROPONINI in the last 168 hours. BNP (last 3 results) No results for  input(s): PROBNP in the last 8760 hours. HbA1C: Recent Labs    06/25/18 0526  HGBA1C 6.1*   CBG: Recent Labs  Lab 06/25/18 1643 06/25/18 2018 06/26/18 0005 06/26/18 0517 06/26/18 0747  GLUCAP 76 95 103* 73 71   Lipid Profile: No results for input(s): CHOL, HDL, LDLCALC, TRIG, CHOLHDL, LDLDIRECT in the last 72 hours. Thyroid Function Tests: Recent Labs    06/24/18 2234 06/25/18 0526  TSH 0.288*  --   FREET4  --  1.25*   Anemia Panel: Recent Labs    06/24/18 2234  VITAMINB12 300  FOLATE 3.3*  FERRITIN 244  TIBC 198*  IRON 45  RETICCTPCT 0.7   Sepsis Labs: Recent Labs  Lab 06/24/18 2052  LATICACIDVEN 1.0    Recent Results (from the past 240 hour(s))  Culture, blood (Routine X 2) w Reflex to ID Panel  Status: None (Preliminary result)   Collection Time: 06/24/18  9:23 PM   Specimen: BLOOD LEFT HAND  Result Value Ref Range Status   Specimen Description   Final    BLOOD LEFT HAND Performed at [facility] [address], [place], [place] [address]    Special Requests   Final    BOTTLES DRAWN AEROBIC ONLY Blood Culture adequate volume Performed at [facility] [address] [place], [place] [address]    Culture   Final    NO GROWTH 2 DAYS Performed at [facility] [address], [place], [place] [address]    Report Status PENDING  Incomplete  SARS Coronavirus 2 (CEPHEID - Performed in [facility] hospital lab), Hosp Order     Status: None   Collection Time: 06/24/18  9:26 PM   Specimen: Nasopharyngeal Swab  Result Value Ref Range Status   SARS Coronavirus 2 NEGATIVE NEGATIVE Final    Comment: (NOTE) If result is NEGATIVE SARS-CoV-2 target nucleic acids are NOT DETECTED. The SARS-CoV-2 RNA is generally detectable in upper and lower  respiratory specimens during the acute phase of infection. The lowest  concentration of SARS-CoV-2 viral copies this assay can detect is 250  copies / mL. A negative  result does not preclude SARS-CoV-2 infection  and should not be used as the sole basis for treatment or other  patient management decisions.  A negative result may occur with  improper specimen collection / handling, submission of specimen other  than nasopharyngeal swab, presence of viral mutation(s) within the  areas targeted by this assay, and inadequate number of viral copies  (<250 copies / mL). A negative result must be combined with clinical  observations, patient history, and epidemiological information. If result is POSITIVE SARS-CoV-2 target nucleic acids are DETECTED. The SARS-CoV-2 RNA is generally detectable in upper and lower  respiratory specimens dur ing the acute phase of infection.  Positive  results are indicative of active infection with SARS-CoV-2.  Clinical  correlation with patient history and other diagnostic information is  necessary to determine patient infection status.  Positive results do  not rule out bacterial infection or co-infection with other viruses. If result is PRESUMPTIVE POSTIVE SARS-CoV-2 nucleic acids MAY BE PRESENT.   A presumptive positive result was obtained on the submitted specimen  and confirmed on repeat testing.  While 2019 novel coronavirus  (SARS-CoV-2) nucleic acids may be present in the submitted sample  additional confirmatory testing may be necessary for epidemiological  and / or clinical management purposes  to differentiate between  SARS-CoV-2 and other Sarbecovirus currently known to infect humans.  If clinically indicated additional testing with an alternate test  methodology [phone]) is advised. The SARS-CoV-2 RNA is generally  detectable in upper and lower respiratory sp ecimens during the acute  phase of infection. The expected result is Negative. Fact Sheet for Patients:  StrictlyIdeas.no Fact Sheet for Healthcare Providers: BankingDealers.co.za This test is not yet  approved or cleared by the [place] FDA and has been authorized for detection and/or diagnosis of SARS-CoV-2 by FDA under an Emergency Use Authorization (EUA).  This EUA will remain in effect (meaning this test can be used) for the duration of the COVID-19 declaration under Section 564(b)(1) of the Act, 21 U.S.C. section 360bbb-3(b)(1), unless the authorization is terminated or revoked sooner. Performed at [facility], [place] [address], [place], [place] [address]   Culture, blood (Routine X 2) w Reflex to ID  Panel     Status: None (Preliminary result)   Collection Time: 06/24/18 10:34 PM   Specimen: BLOOD LEFT HAND  Result Value Ref Range Status   Specimen Description   Final    BLOOD LEFT HAND Performed at [facility] [address], [place], [place] [address]    Special Requests   Final    BOTTLES DRAWN AEROBIC AND ANAEROBIC Blood Culture adequate volume Performed at [facility] [address] [place], [place] [address]    Culture   Final    NO GROWTH 2 DAYS Performed at [facility] [address] [place], [place] [address]    Report Status PENDING  Incomplete       Radiology Studies: Ct Maxillofacial W Contrast  Result Date: 06/25/2018 CLINICAL DATA:  Dental pain. Evaluation for abscess. EXAM: CT MAXILLOFACIAL WITH CONTRAST TECHNIQUE: Multidetector CT imaging of the maxillofacial structures was performed with intravenous contrast. Multiplanar CT image reconstructions were also generated. CONTRAST:  49mL OMNIPAQUE IOHEXOL 300 MG/ML  SOLN COMPARISON:  None. FINDINGS: Osseous: Absent dentition aside from residual mandibular incisor tooth fragments with periapical lucency associated with the right central incisor. No evidence of associated subperiosteal abscess or significant overlying soft tissue swelling. No fracture. Orbits: Bilateral cataract extraction. Sinuses: Small right maxillary sinus mucous  retention cyst. Clear mastoid air cells. Soft tissues: Extensive bilateral carotid artery calcific atherosclerosis with potentially severe proximal ICA stenoses bilaterally. Assessment is limited by non angiographic technique and the dense calcification limiting visualization of the residual patent lumen. Limited intracranial: Carotid siphon and vertebral artery atherosclerosis. IMPRESSION: 1. Periapical lucency associated with the right central incisor tooth fragment. No evidence of subperiosteal abscess. 2. Extensive atherosclerosis with potentially severe bilateral ICA stenoses. Electronically Signed   By: Logan Bores M.D.   On: 06/25/2018 17:05   Dg Abd Acute 2+v W 1v Chest  Result Date: 06/24/2018 CLINICAL DATA:  Hypotension. EXAM: DG ABDOMEN ACUTE W/ 1V CHEST COMPARISON:  12/19/2015 FINDINGS: The patient is status post prior median sternotomy. The heart size is stable. Aortic calcifications are noted. Chronic lung changes are noted bilaterally without evidence of a focal infiltrate or pneumothorax. There is no large pleural effusion. No acute osseous abnormality. The bowel gas pattern is nonobstructive. There is a large amount of stool throughout the colon. Phleboliths project over the patient's pelvis. Degenerative changes are noted of the lumbar spine. No radiographic evidence for pneumatosis or free air. IMPRESSION: 1. No acute cardiopulmonary process. 2. Nonobstructive bowel gas pattern. 3. Large amount of stool in the colon. Electronically Signed   By: Constance Holster M.D.   On: 06/24/2018 19:24   US Abdomen Limited Ruq  Result Date: 06/25/2018 CLINICAL DATA:  Nausea and vomiting EXAM: ULTRASOUND ABDOMEN LIMITED RIGHT UPPER QUADRANT COMPARISON:  None. FINDINGS: Gallbladder: Cholelithiasis with gallbladder sludge. Gallbladder wall thickening measuring 5.6 mm. No pericholecystic fluid. Negative sonographic Murphy sign. Common bile duct: Diameter: 8.1 mm.  No choledocholithiasis. Liver: No focal  lesion identified. Within normal limits in parenchymal echogenicity. Portal vein is patent on color Doppler imaging with normal direction of blood flow towards the liver. IMPRESSION: Cholelithiasis with gallbladder wall thickening concerning for acute cholecystitis. Electronically Signed   By: Kathreen Devoid   On: 06/25/2018 17:17      Scheduled Meds: . aspirin  325 mg Oral Daily  . atorvastatin  80 mg Oral q1800  . enoxaparin (LOVENOX) injection  30 mg Subcutaneous QHS  . feeding supplement (ENSURE ENLIVE)  237 mL Oral BID BM  . folic acid  1 mg Oral Daily  . insulin aspart  0-9 Units Subcutaneous Q4H  . mirtazapine  15 mg Oral QHS  . senna  1 tablet Oral BID  . sodium chloride flush  3 mL Intravenous Q12H  . sodium chloride flush  3 mL Intravenous Q12H   Continuous Infusions: . sodium chloride 10 mL/hr at 06/25/18 0500     LOS: 1 day    Time spent: 25 minutes   Dessa Phi, DO [facility] [email or web address] 06/26/2018, 9:35 AM

## 2018-06-26 NOTE — Progress Notes (Signed)
PHARMACY - PHYSICIAN COMMUNICATION CRITICAL VALUE ALERT - BLOOD CULTURE IDENTIFICATION (BCID)  [name] [name] is an 85 y.o. female who presented to [facility] on 06/24/2018 with a chief complaint of nausea/vomiting  Assessment:  BCID 1/3 GPC (aerobic bottle).  BCID panel not ran on GPCs  Name of physician (or Provider) Contacted: Dr. Maylene Roes  Current antibiotics: Ceftriaxone 2 g IV daily  Changes to prescribed antibiotics: Adding metronidazole for coverage for suspected IAI.  No results found for this or any previous visit.  Lenis Noon, PharmD 06/26/2018  6:22 PM

## 2018-06-26 NOTE — Evaluation (Signed)
Occupational Therapy Evaluation Patient Details Name: [name] [name] MRN: [id number] DOB: 15-Apr-1931 Today's Date: 06/26/2018    History of Present Illness [name] [name] is a 85 y.o. female with medical history significant for dementia, coronary artery disease, ischemic cardiomyopathy, hypothyroidism, and type 2 diabetes mellitus, now presenting to emergency department for evaluation of nausea, vomiting, and low blood pressure.   Clinical Impression   Pt was admitted for the above. Per chart, she lives at home with husband.  Standing activity was limited by BP and c/o dizziness.  She performed parts of adls, but did not want to fully bathe at this time.  Will follow in acute setting with supervision level goals    Follow Up Recommendations  Supervision/Assistance - 24 hour;Home health OT(depending upon progress/activity tolerance)    Equipment Recommendations  (? 3:1 vs none)    Recommendations for Other Services       Precautions / Restrictions Precautions Precautions: Fall;Other (comment) Precaution Comments: monitor BP Restrictions Weight Bearing Restrictions: No      Mobility Bed Mobility Overal bed mobility: Needs Assistance Bed Mobility: Supine to Sit     Supine to sit: Min guard     General bed mobility comments: oob with PT  Transfers Overall transfer level: Needs assistance   Transfers: Sit to/from Stand Sit to Stand: Min guard         General transfer comment: for safety; c/o dizziness    Balance Overall balance assessment: Needs assistance           Standing balance-Leahy Scale: Fair                             ADL either performed or assessed with clinical judgement   ADL Overall ADL's : Needs assistance/impaired     Grooming: Set up;Sitting   Upper Body Bathing: Set up   Lower Body Bathing: Min guard;Sit to/from stand   Upper Body Dressing : Set up   Lower Body Dressing: Min guard;Sit to/from stand   Toilet  Transfer: Min guard;Stand-pivot;RW(chair)   Toileting- Water quality scientist and Hygiene: Min guard         General ADL Comments: completed components of eval:  did not want to complete entire adl.  Pt c/o dizziness, seated BP 93/53     Vision         Perception     Praxis      Pertinent Vitals/Pain Pain Assessment: No/denies pain     Hand Dominance     Extremity/Trunk Assessment Upper Extremity Assessment Upper Extremity Assessment: Generalized weakness(grossly 4/5)   Lower Extremity Assessment Lower Extremity Assessment: Overall WFL for tasks assessed;Generalized weakness       Communication Communication Communication: No difficulties   Cognition Arousal/Alertness: Awake/alert Behavior During Therapy: Flat affect Overall Cognitive Status: No family/caregiver present to determine baseline cognitive functioning                                 General Comments: Chart states pt has had progressing dementia.  She was able to answer questions regarding home set-up, but had trouble clarifying more specific questions.   General Comments       Exercises     Shoulder Instructions      Home Living Family/patient expects to be discharged to:: Private residence Living Arrangements: Spouse/significant other Available Help at Discharge: Family Type of Home: House Home Access: Stairs  to enter Entrance Stairs-Number of Steps: 3   Home Layout: One level     Bathroom Shower/Tub: (didn't think she had a shower; might sponge bathe)   Bathroom Toilet: Standard         Additional Comments: Pt's answers to home set-up match a previous admission from 3 years ago.      Prior Functioning/Environment          Comments: Pt reports she is independent with ambulation        OT Problem List: Decreased strength;Decreased activity tolerance;Cardiopulmonary status limiting activity;Decreased cognition      OT Treatment/Interventions: Self-care/ADL  training;Energy conservation;DME and/or AE instruction;Patient/family education;Balance training;Cognitive remediation/compensation;Therapeutic activities    OT Goals(Current goals can be found in the care plan section) Acute Rehab OT Goals Patient Stated Goal: none stated, but agreeable to get OOB OT Goal Formulation: Patient unable to participate in goal setting Time For Goal Achievement: 07/10/18 Potential to Achieve Goals: Good ADL Goals Pt Will Transfer to Toilet: with supervision;ambulating;regular height toilet;bedside commode(vs) Pt Will Perform Toileting - Clothing Manipulation and hygiene: with supervision;sit to/from stand Additional ADL Goal #1: pt will complete adl with supervision and set up  OT Frequency: Min 2X/week   Barriers to D/C:            Co-evaluation              AM-PAC OT "6 Clicks" Daily Activity     Outcome Measure Help from another person eating meals?: None(NPO at this time) Help from another person taking care of personal grooming?: A Little Help from another person toileting, which includes using toliet, bedpan, or urinal?: A Little Help from another person bathing (including washing, rinsing, drying)?: A Little Help from another person to put on and taking off regular upper body clothing?: A Little Help from another person to put on and taking off regular lower body clothing?: A Little 6 Click Score: 19   End of Session Nurse Communication: (BP, up in chair)  Activity Tolerance: Patient limited by fatigue(BP) Patient left: in chair;with call bell/phone within reach;with chair alarm set  OT Visit Diagnosis: Muscle weakness (generalized) (M62.81)                Time: 3568-6168 OT Time Calculation (min): 19 min Charges:  OT General Charges $OT Visit: 1 Visit OT Evaluation $OT Eval Low Complexity: [place], OTR/L Acute Rehabilitation Services [phone] [facility] pager [phone] office 06/26/2018  [place] 06/26/2018,  11:21 AM

## 2018-06-26 NOTE — Evaluation (Signed)
Physical Therapy Evaluation Patient Details Name: [name] [name] MRN: [id number] DOB: 12-29-1931 Today's Date: 06/26/2018   History of Present Illness  [name] [name] is a 85 y.o. female with medical history significant for dementia, coronary artery disease, ischemic cardiomyopathy, hypothyroidism, and type 2 diabetes mellitus, now presenting to emergency department for evaluation of nausea, vomiting, and low blood pressure.  Clinical Impression  Pt admitted with above diagnosis. Pt currently with functional limitations due to the deficits listed below (see PT Problem List).  Pt will benefit from skilled PT to increase their independence and safety with mobility to allow discharge to the venue listed below.  Pt limited by dizziness at eval, but was moving with MIN/guard.  Recommend 24/7 S and HHPT.  If family cannot provide A, then may need to consider skilled nursing.     Follow Up Recommendations Home health PT;Supervision/Assistance - 24 hour    Equipment Recommendations  Rolling walker with 5" wheels;Other (comment)(possibly- based on progress)    Recommendations for Other Services       Precautions / Restrictions Precautions Precautions: Fall;Other (comment) Precaution Comments: monitor BP      Mobility  Bed Mobility Overal bed mobility: Needs Assistance Bed Mobility: Supine to Sit     Supine to sit: Min guard     General bed mobility comments: Pt able to come to EOB with min/guard for safety and cues to not immediately stand.  Transfers Overall transfer level: Needs assistance   Transfers: Sit to/from Stand Sit to Stand: Min guard         General transfer comment: Pt stood and was encouraged to ambulate, but then turned and sat in chair and stated she was dizzy.  Once dynamap was retrieved, her BP was 93/53 and HR 84.  Ambulation/Gait             General Gait Details: deferred due to feeling dizzy  Stairs            Wheelchair Mobility     Modified Rankin (Stroke Patients Only)       Balance Overall balance assessment: Needs assistance           Standing balance-Leahy Scale: Fair                               Pertinent Vitals/Pain Pain Assessment: No/denies pain    Home Living Family/patient expects to be discharged to:: Private residence Living Arrangements: Spouse/significant other Available Help at Discharge: Family Type of Home: House Home Access: Stairs to enter   Technical brewer of Steps: 3 Home Layout: One level   Additional Comments: Pt's answers to home set-up match a previous admission from 3 years ago.    Prior Function           Comments: Pt reports she is independent with ambulation     Hand Dominance        Extremity/Trunk Assessment   Upper Extremity Assessment Upper Extremity Assessment: Defer to OT evaluation    Lower Extremity Assessment Lower Extremity Assessment: Overall WFL for tasks assessed;Generalized weakness       Communication   Communication: No difficulties  Cognition Arousal/Alertness: Lethargic Behavior During Therapy: Flat affect Overall Cognitive Status: No family/caregiver present to determine baseline cognitive functioning  General Comments: Chart states pt has had progressing dementia.  She was able to answer questions regarding home set-up, but had trouble clarifying more specific questions.      General Comments      Exercises     Assessment/Plan    PT Assessment Patient needs continued PT services  PT Problem List Decreased strength;Decreased activity tolerance;Decreased mobility;Cardiopulmonary status limiting activity;Decreased safety awareness       PT Treatment Interventions Gait training;Functional mobility training;Therapeutic exercise;Therapeutic activities;Balance training    PT Goals (Current goals can be found in the Care Plan section)  Acute Rehab PT  Goals Patient Stated Goal: none stated, but agreeable to get OOB PT Goal Formulation: With patient Time For Goal Achievement: 07/10/18 Potential to Achieve Goals: Good    Frequency Min 3X/week   Barriers to discharge        Co-evaluation               AM-PAC PT "6 Clicks" Mobility  Outcome Measure Help needed turning from your back to your side while in a flat bed without using bedrails?: None Help needed moving from lying on your back to sitting on the side of a flat bed without using bedrails?: None Help needed moving to and from a bed to a chair (including a wheelchair)?: A Little Help needed standing up from a chair using your arms (e.g., wheelchair or bedside chair)?: A Little Help needed to walk in hospital room?: A Little Help needed climbing 3-5 steps with a railing? : A Little 6 Click Score: 20    End of Session Equipment Utilized During Treatment: Gait belt Activity Tolerance: Treatment limited secondary to medical complications (Comment)(pt feeling dizzy) Patient left: in chair;Other (comment)(with OT) Nurse Communication: Mobility status PT Visit Diagnosis: Muscle weakness (generalized) (M62.81);Difficulty in walking, not elsewhere classified (R26.2)    Time: 2694-8546 PT Time Calculation (min) (ACUTE ONLY): 16 min   Charges:   PT Evaluation $PT Eval Low Complexity: 1 Low          Angad Nabers L. Tamala Julian, [place] Pager [phone] 06/26/2018   Galen Manila 06/26/2018, 10:46 AM

## 2018-06-26 NOTE — Consult Note (Addendum)
[facility] Consult Note  [name] 07/04/1931  [id number].    Requesting MD: Dr. Dessa Phi Chief Complaint/Reason for Consult: Nausea and emesis/Gallbladder wall thickening on [place]  HPI: [name] [name] is a 85 y.o. with a history of dementia, CAD with CABG x 4 vessels in 2017, ischemic cardiomyopathy, DM2, CKD and hypothyroidism who was admitted on 6/17 for hypotension.  History obtained by patient, chart review and patient's husband.  Patient reportedly has been experiencing nausea and vomiting over the last 3-4 weeks.  I spoke with her husband who reports that this issue has been ongoing for more than a year but worsened over the last month.  He reports that she has not had an appetite but anytime she eats she becomes nauseous and vomits less than 10 minutes after eating.  He reports that because of this she has lost approximately 20-25 pounds since symptom onset.  He denies any associated abdominal pain.  Patient denies any abdominal pain in over a year.  There is no documented abdominal pain reported to hospitalist or ED physicians either from what I can see on notes.  No reported associated fever, chills, constipation or diarrhea.  No prior abdominal surgeries reported.  She had a normal colonoscopy in April 2014.  EGD in 2014 showed likely gastroparesis and had biopsies done to rule out celiac disease and H. Pylori. Bx were negative. Patient lives at home with husband. Uses walker for ambulation. She takes a full dose aspirin daily. An ultrasound was obtained on 6/18 that showed Cholelithiasis with gallbladder wall thickening for acute cholecystitis. No pericholecystic fluid.  Common bile duct 8.1 mm.  No choledocholithiasis.  White blood cell count not elevated.  No LFT elevation.  T bili 1.0.  LFTs pending this morning. General surgery was asked to consult.    ROS: Review of Systems  Unable to perform ROS: Dementia  Constitutional: Negative for chills and fever.   Gastrointestinal: Positive for nausea and vomiting. Negative for abdominal pain, constipation and diarrhea.    Family History  Problem Relation Age of Onset  . Diabetes Father   . Stroke Mother   . Breast cancer Maternal Aunt   . Throat cancer Maternal Uncle   . Lung cancer Brother   . Prostate cancer Brother   . Breast cancer Maternal Grandmother   . Rectal cancer Neg Hx   . Stomach cancer Neg Hx   . Colon cancer Neg Hx   . Esophageal cancer Neg Hx     Past Medical History:  Diagnosis Date  . Anemia   . Anginal pain ([place])   . Cataract   . Colon polyp 2009    TUBULAR ADENOMA  . Coronary artery disease   . Depression   . Diabetes ([place])   . Diverticulosis of colon (without mention of hemorrhage) 2009  . Esophagitis 2009  . Gastritis 2009  . GERD (gastroesophageal reflux disease) 2009  . Hiatal hernia 2009  . Hyperlipemia   . Hypertension   . Thyroid disease    hypothyroid    Past Surgical History:  Procedure Laterality Date  . BREAST BIOPSY Left 1999   neg  . CARDIAC CATHETERIZATION N/A 11/15/2015   Procedure: Left Heart Cath and Coronary Angiography;  Surgeon: Troy Sine, MD;  Location: [place] [facility];  Service: Cardiovascular;  Laterality: N/A;  . COLONOSCOPY  05/2012   Dr. Sharlett Iles  . CORONARY ARTERY BYPASS GRAFT  11/16/2015   Procedure: OFF PUMP CORONARY ARTERY BYPASS GRAFTING (  CABG) x FOUR, USING LEFT MAMMARY ARTERY  AND ENDOSCOPICALLY HARVESTED RIGHT GREATER SAPHENOUS VEIN.;  Surgeon: [place] Nakayama, MD;  Location: [place];  Service: Open Heart Surgery;;  . DILATION AND CURETTAGE OF UTERUS  1984  . TEE WITHOUT CARDIOVERSION N/A 11/16/2015   Procedure: TRANSESOPHAGEAL ECHOCARDIOGRAM (TEE);  Surgeon: [place] Nakayama, MD;  Location: [place];  Service: Open Heart Surgery;  Laterality: N/A;    Social History:  reports that she has never smoked. Her smokeless tobacco use includes snuff. She reports that she does not drink alcohol or use  drugs.  Allergies:  Allergies  Allergen Reactions  . Bee Venom Anaphylaxis and Other (See Comments)    Eyes swell, cannot breathe  . Persantine [Dipyridamole] Other (See Comments)    "went crazy"  . Actos [Pioglitazone] Other (See Comments)    UNSPECIFIED REACTION     Medications Prior to Admission  Medication Sig Dispense Refill  . aspirin EC 325 MG EC tablet Take 1 tablet (325 mg total) by mouth daily. 30 tablet 0  . atorvastatin (LIPITOR) 80 MG tablet TAKE 1 TABLET DAILY FOR CHOLESTEROL 90 tablet 4  . metFORMIN (GLUCOPHAGE-XR) 500 MG 24 hr tablet TAKE 2 TABLETS BY MOUTH TWICE A DAY (Patient taking differently: Take 500 mg by mouth 2 (two) times a day. ) 120 tablet 1  . mirtazapine (REMERON) 15 MG tablet TAKE 1 TABLET ONE HOUR BEFORE SLEEP (Patient taking differently: Take 15 mg by mouth at bedtime. TAKE 1 TABLET ONE HOUR BEFORE SLEEP) 90 tablet 0  . verapamil (CALAN-SR) 240 MG CR tablet Take 1 tablet daily with food for BP - needs OV before further refills 30 tablet 0  . blood glucose meter kit and supplies KIT Dispense based on patient and insurance preference. Check blood sugar 1 time daily-DX-E11.22. 1 each 0  . nitroGLYCERIN (NITROSTAT) 0.4 MG SL tablet Dissolve 1 tablet under tongue every 3 to 5 minutes if needed for ChestPain 50 tablet 12  . ondansetron (ZOFRAN ODT) 8 MG disintegrating tablet Dissolve 1 tablet under tongue every 6 hours for nausea  or vomitting 30 tablet 0  . polyethylene glycol (MIRALAX / GLYCOLAX) packet Take 17 g by mouth daily. (Patient taking differently: Take 17 g by mouth daily as needed for moderate constipation. ) 90 each 2    Prior to Admission medications   Medication Sig Start Date End Date Taking? Authorizing Provider  aspirin EC 325 MG EC tablet Take 1 tablet (325 mg total) by mouth daily. 11/25/15  Yes Barrett, Erin R, PA-C  atorvastatin (LIPITOR) 80 MG tablet TAKE 1 TABLET DAILY FOR CHOLESTEROL 08/22/17  Yes Liane Comber, NP  metFORMIN  (GLUCOPHAGE-XR) 500 MG 24 hr tablet TAKE 2 TABLETS BY MOUTH TWICE A DAY Patient taking differently: Take 500 mg by mouth 2 (two) times a day.  12/23/16  Yes Liane Comber, NP  mirtazapine (REMERON) 15 MG tablet TAKE 1 TABLET ONE HOUR BEFORE SLEEP Patient taking differently: Take 15 mg by mouth at bedtime. TAKE 1 TABLET ONE HOUR BEFORE SLEEP 06/12/18  Yes Liane Comber, NP  verapamil (CALAN-SR) 240 MG CR tablet Take 1 tablet daily with food for BP - needs OV before further refills 04/20/18  Yes [name], MD  blood glucose meter kit and supplies KIT Dispense based on patient and insurance preference. Check blood sugar 1 time daily-DX-E11.22. 12/08/15   [name], MD  nitroGLYCERIN (NITROSTAT) 0.4 MG SL tablet Dissolve 1 tablet under tongue every 3 to 5 minutes if  needed for ChestPain 10/31/15 01/27/17  [name], MD  ondansetron (ZOFRAN ODT) 8 MG disintegrating tablet Dissolve 1 tablet under tongue every 6 hours for nausea  or vomitting 06/23/18   [name], MD  polyethylene glycol [facility] / Floria Raveling) packet Take 17 g by mouth daily. Patient taking differently: Take 17 g by mouth daily as needed for moderate constipation.  10/28/17   Vicie Mutters, PA-C    Blood pressure 94/69, pulse 60, temperature 98.2 F (36.8 C), resp. rate 16, weight 47.2 kg, SpO2 98 %. Physical Exam: General: frail, elderly appearing female lying in bed in NAD.   HEENT: head is normocephalic, atraumatic.  Sclera are noninjected.  Pupils equal and round.  Ears and nose without any masses or lesions.  Mouth is pink and moist.  Heart: regular, rate, and rhythm. Systolic murmur noted. Palpable pedal pulses bilaterally Lungs: CTAB, no wheezes, rhonchi, or rales noted.  Respiratory effort nonlabored Abd: Soft, NT/ND, +BS, no masses, hernias, or organomegaly. Patient does occassionally make faces of possible discomfort with deep palpation of the RUQ, RLQ, LLQ but reports this is not painful. No rebound,  rigidity or gaurding. No peritonitis. MS: all 4 extremities are symmetrical with no cyanosis, clubbing, or edema. Skin: warm and dry with no masses, lesions, or rashes Psych: A&Ox3 with an appropriate affect. Neuro: cranial nerves grossly intact, extremity CSM intact bilaterally, normal speech  Results for orders placed or performed during the hospital encounter of 06/24/18 (from the past 48 hour(s))  CBC with Differential/Platelet     Status: Abnormal   Collection Time: 06/24/18  5:39 PM  Result Value Ref Range   WBC 3.6 (L) 4.0 - 10.5 K/uL    Comment: WHITE COUNT CONFIRMED ON SMEAR   RBC 3.59 (L) 3.87 - 5.11 MIL/uL   Hemoglobin 11.0 (L) 12.0 - 15.0 g/dL   HCT 32.6 (L) 36.0 - 46.0 %   MCV 90.8 80.0 - 100.0 fL   MCH 30.6 26.0 - 34.0 pg   MCHC 33.7 30.0 - 36.0 g/dL   RDW 13.8 11.5 - 15.5 %   Platelets 182 150 - 400 K/uL   nRBC 0.0 0.0 - 0.2 %   Neutrophils Relative % 46 %   Neutro Abs 1.6 (L) 1.7 - 7.7 K/uL   Lymphocytes Relative 32 %   Lymphs Abs 1.1 0.7 - 4.0 K/uL   Monocytes Relative 21 %   Monocytes Absolute 0.8 0.1 - 1.0 K/uL   Eosinophils Relative 0 %   Eosinophils Absolute 0.0 0.0 - 0.5 K/uL   Basophils Relative 0 %   Basophils Absolute 0.0 0.0 - 0.1 K/uL   Immature Granulocytes 1 %   Abs Immature Granulocytes 0.03 0.00 - 0.07 K/uL   Schistocytes PRESENT    Burr Cells PRESENT     Comment: Performed at [facility], [place] [address] [place], [place] [address]  Comprehensive metabolic panel     Status: Abnormal   Collection Time: 06/24/18  5:39 PM  Result Value Ref Range   Sodium 131 (L) 135 - 145 mmol/L   Potassium 3.7 3.5 - 5.1 mmol/L   Chloride 98 98 - 111 mmol/L   CO2 22 22 - 32 mmol/L   Glucose, Bld 136 (H) 70 - 99 mg/dL   BUN 13 8 - 23 mg/dL   Creatinine, Ser 0.83 0.44 - 1.00 mg/dL   Calcium 8.4 (L) 8.9 - 10.3 mg/dL   Total Protein 7.1 6.5 - 8.1 g/dL   Albumin 3.9 3.5 -  5.0 g/dL   AST 16 15 - 41 U/L   ALT 9 0 - 44 U/L   Alkaline  Phosphatase 33 (L) 38 - 126 U/L   Total Bilirubin 1.0 0.3 - 1.2 mg/dL   GFR calc non Af Amer >60 >60 mL/min   GFR calc Af Amer >60 >60 mL/min   Anion gap 11 5 - 15    Comment: Performed at [facility], [place] [address], [place], [place] [address]  Lipase, blood     Status: Abnormal   Collection Time: 06/24/18  5:39 PM  Result Value Ref Range   Lipase 70 (H) 11 - 51 U/L    Comment: Performed at [facility], [place] [address] [place], [place] [address]  Urinalysis, Routine w reflex microscopic     Status: Abnormal   Collection Time: 06/24/18  6:39 PM  Result Value Ref Range   Color, Urine YELLOW YELLOW   APPearance CLEAR CLEAR   Specific Gravity, Urine 1.008 1.005 - 1.030   pH 6.0 5.0 - 8.0   Glucose, UA 50 (A) NEGATIVE mg/dL   Hgb urine dipstick NEGATIVE NEGATIVE   Bilirubin Urine NEGATIVE NEGATIVE   Ketones, ur 20 (A) NEGATIVE mg/dL   Protein, ur NEGATIVE NEGATIVE mg/dL   Nitrite NEGATIVE NEGATIVE   Leukocytes,Ua MODERATE (A) NEGATIVE   RBC / HPF 0-5 0 - 5 RBC/hpf   WBC, UA 6-10 0 - 5 WBC/hpf   Bacteria, UA RARE (A) NONE SEEN   Squamous Epithelial / LPF 0-5 0 - 5    Comment: Performed at [facility], [place] [address], [place], [place] [address]  Lactic acid, plasma     Status: None   Collection Time: 06/24/18  8:52 PM  Result Value Ref Range   Lactic Acid, Venous 1.0 0.5 - 1.9 mmol/L    Comment: Performed at [facility], [place] [address], [place], [place] [address]  Culture, blood (Routine X 2) w Reflex to ID Panel     Status: None (Preliminary result)   Collection Time: 06/24/18  9:23 PM   Specimen: BLOOD LEFT HAND  Result Value Ref Range   Specimen Description      BLOOD LEFT HAND Performed at [facility] [address], [place], [place] [address]    Special Requests      BOTTLES DRAWN AEROBIC ONLY Blood Culture adequate volume Performed at [facility] [address], [place], [place] [address]    Culture      NO GROWTH 2 DAYS Performed at [facility], [place] [address], [place], [place] [address]    Report Status PENDING   SARS Coronavirus 2 (CEPHEID - Performed in [place] hospital lab), Hosp Order     Status: None   Collection Time: 06/24/18  9:26 PM   Specimen: Nasopharyngeal Swab  Result Value Ref Range   SARS Coronavirus 2 NEGATIVE NEGATIVE    Comment: (NOTE) If result is NEGATIVE SARS-CoV-2 target nucleic acids are NOT DETECTED. The SARS-CoV-2 RNA is generally detectable in upper and lower  respiratory specimens during the acute phase of infection. The lowest  concentration of SARS-CoV-2 viral copies this assay can detect is 250  copies / mL. A negative result does not preclude SARS-CoV-2 infection  and should not be used as the sole basis for treatment or other  patient management decisions.  A negative result may occur with  improper specimen collection / handling, submission of  specimen other  than nasopharyngeal swab, presence of viral mutation(s) within the  areas targeted by this assay, and inadequate number of viral copies  (<250 copies / mL). A negative result must be combined with clinical  observations, patient history, and epidemiological information. If result is POSITIVE SARS-CoV-2 target nucleic acids are DETECTED. The SARS-CoV-2 RNA is generally detectable in upper and lower  respiratory specimens dur ing the acute phase of infection.  Positive  results are indicative of active infection with SARS-CoV-2.  Clinical  correlation with patient history and other diagnostic information is  necessary to determine patient infection status.  Positive results do  not rule out bacterial infection or co-infection with other viruses. If result is PRESUMPTIVE POSTIVE SARS-CoV-2 nucleic acids MAY BE PRESENT.   A presumptive positive result was obtained on the submitted specimen  and confirmed on repeat testing.   While 2019 novel coronavirus  (SARS-CoV-2) nucleic acids may be present in the submitted sample  additional confirmatory testing may be necessary for epidemiological  and / or clinical management purposes  to differentiate between  SARS-CoV-2 and other Sarbecovirus currently known to infect humans.  If clinically indicated additional testing with an alternate test  methodology [phone]) is advised. The SARS-CoV-2 RNA is generally  detectable in upper and lower respiratory sp ecimens during the acute  phase of infection. The expected result is Negative. Fact Sheet for Patients:  StrictlyIdeas.no Fact Sheet for Healthcare Providers: BankingDealers.co.za This test is not yet approved or cleared by the [place] FDA and has been authorized for detection and/or diagnosis of SARS-CoV-2 by FDA under an Emergency Use Authorization (EUA).  This EUA will remain in effect (meaning this test can be used) for the duration of the COVID-19 declaration under Section 564(b)(1) of the Act, 21 U.S.C. section 360bbb-3(b)(1), unless the authorization is terminated or revoked sooner. Performed at [facility], [place] [address], [place], [place] [address]   Glucose, capillary     Status: Abnormal   Collection Time: 06/24/18 10:25 PM  Result Value Ref Range   Glucose-Capillary 114 (H) 70 - 99 mg/dL  Culture, blood (Routine X 2) w Reflex to ID Panel     Status: None (Preliminary result)   Collection Time: 06/24/18 10:34 PM   Specimen: BLOOD LEFT HAND  Result Value Ref Range   Specimen Description      BLOOD LEFT HAND Performed at [facility] [address] [place], [place] [address]    Special Requests      BOTTLES DRAWN AEROBIC AND ANAEROBIC Blood Culture adequate volume Performed at [facility] [address], [place], [place] [address]    Culture      NO GROWTH 2 DAYS Performed at [facility], [place] [address] [place], [place] [address]    Report Status PENDING   TSH     Status: Abnormal   Collection Time: 06/24/18 10:34 PM  Result Value Ref Range   TSH 0.288 (L) 0.350 - 4.500 uIU/mL    Comment: Performed by a 3rd Generation assay with a functional sensitivity of <=0.01 uIU/mL. Performed at [facility], [place] [address] [place], [place] [address]   Cortisol     Status: None   Collection Time: 06/24/18 10:34 PM  Result Value Ref Range   Cortisol, Plasma 13.7 ug/dL    Comment: (NOTE) AM    6.7 - 22.6 ug/dL PM   <10.0       ug/dL Performed  at [facility], [place] [address], [place], [place] [address]   Vitamin B12     Status: None   Collection Time: 06/24/18 10:34 PM  Result Value Ref Range   Vitamin B-12 300 180 - 914 pg/mL    Comment: (NOTE) This assay is not validated for testing neonatal or myeloproliferative syndrome specimens for Vitamin B12 levels. Performed at [facility], [place] [address], [place], [place] [address]   Folate     Status: Abnormal   Collection Time: 06/24/18 10:34 PM  Result Value Ref Range   Folate 3.3 (L) >5.9 ng/mL    Comment: Performed at [facility], [place] [address] [place], [place] [address]  Iron and TIBC     Status: Abnormal   Collection Time: 06/24/18 10:34 PM  Result Value Ref Range   Iron 45 28 - 170 ug/dL   TIBC 198 (L) 250 - 450 ug/dL   Saturation Ratios 23 10.4 - 31.8 %   UIBC 153 ug/dL    Comment: Performed at [facility], [place] [address], [place], [place] [address]  Ferritin     Status: None   Collection Time: 06/24/18 10:34 PM  Result Value Ref Range   Ferritin 244 11 - 307 ng/mL    Comment: Performed at [facility], [place] [address] [place], [place] [address]  Reticulocytes     Status: Abnormal   Collection Time: 06/24/18 10:34 PM  Result Value Ref Range   Retic Ct Pct 0.7 0.4 - 3.1 %   RBC. 3.30 (L) 3.87  - 5.11 MIL/uL   Retic Count, Absolute 22.1 19.0 - 186.0 K/uL   Immature Retic Fract 2.8 2.3 - 15.9 %    Comment: Performed at [facility], [place] [address], [place], [place] [address]  Glucose, capillary     Status: None   Collection Time: 06/25/18  4:36 AM  Result Value Ref Range   Glucose-Capillary 92 70 - 99 mg/dL  Comprehensive metabolic panel     Status: Abnormal   Collection Time: 06/25/18  5:26 AM  Result Value Ref Range   Sodium 135 135 - 145 mmol/L   Potassium 3.6 3.5 - 5.1 mmol/L   Chloride 102 98 - 111 mmol/L   CO2 23 22 - 32 mmol/L   Glucose, Bld 103 (H) 70 - 99 mg/dL   BUN 11 8 - 23 mg/dL   Creatinine, Ser 0.76 0.44 - 1.00 mg/dL   Calcium 8.1 (L) 8.9 - 10.3 mg/dL   Total Protein 6.4 (L) 6.5 - 8.1 g/dL   Albumin 3.5 3.5 - 5.0 g/dL   AST 13 (L) 15 - 41 U/L   ALT 9 0 - 44 U/L   Alkaline Phosphatase 31 (L) 38 - 126 U/L   Total Bilirubin 1.0 0.3 - 1.2 mg/dL   GFR calc non Af Amer >60 >60 mL/min   GFR calc Af Amer >60 >60 mL/min   Anion gap 10 5 - 15    Comment: Performed at [facility], [place] [address] [place],  [address]  CBC WITH DIFFERENTIAL     Status: Abnormal   Collection Time: 06/25/18  5:26 AM  Result Value Ref Range   WBC 3.3 (L) 4.0 - 10.5 K/uL   RBC 3.03 (L) 3.87 - 5.11 MIL/uL   Hemoglobin 9.3 (L) 12.0 - 15.0 g/dL   HCT 27.9 (L) 36.0 - 46.0 %   MCV 92.1 80.0 - 100.0 fL  MCH 30.7 26.0 - 34.0 pg   MCHC 33.3 30.0 - 36.0 g/dL   RDW 13.9 11.5 - 15.5 %   Platelets 159 150 - 400 K/uL   nRBC 0.0 0.0 - 0.2 %   Neutrophils Relative % 39 %   Neutro Abs 1.3 (L) 1.7 - 7.7 K/uL   Lymphocytes Relative 37 %   Lymphs Abs 1.2 0.7 - 4.0 K/uL   Monocytes Relative 22 %   Monocytes Absolute 0.7 0.1 - 1.0 K/uL   Eosinophils Relative 0 %   Eosinophils Absolute 0.0 0.0 - 0.5 K/uL   Basophils Relative 0 %   Basophils Absolute 0.0 0.0 - 0.1 K/uL   Immature Granulocytes 2 %   Abs Immature Granulocytes 0.05 0.00 - 0.07 K/uL    Acanthocytes MARKED    Schistocytes PRESENT     Comment: Performed at [facility], [place] [address] [place], [place] [address]  T4, free     Status: Abnormal   Collection Time: 06/25/18  5:26 AM  Result Value Ref Range   Free T4 1.25 (H) 0.61 - 1.12 ng/dL    Comment: (NOTE) Biotin ingestion may interfere with free T4 tests. If the results are inconsistent with the TSH level, previous test results, or the clinical presentation, then consider biotin interference. If needed, order repeat testing after stopping biotin. Performed at [facility], [place] [address] [place], [place] [address]   Hemoglobin A1c     Status: Abnormal   Collection Time: 06/25/18  5:26 AM  Result Value Ref Range   Hgb A1c MFr Bld 6.1 (H) 4.8 - 5.6 %    Comment: (NOTE) Pre diabetes:          5.7%-6.4% Diabetes:              >6.4% Glycemic control for   <7.0% adults with diabetes    Mean Plasma Glucose 128.37 mg/dL    Comment: Performed at [facility] [address], [place], [place] [address]  Glucose, capillary     Status: None   Collection Time: 06/25/18  8:10 AM  Result Value Ref Range   Glucose-Capillary 98 70 - 99 mg/dL   Comment 1 Notify RN    Comment 2 Document in Chart   Glucose, capillary     Status: None   Collection Time: 06/25/18 12:39 PM  Result Value Ref Range   Glucose-Capillary 88 70 - 99 mg/dL   Comment 1 Notify RN    Comment 2 Document in Chart   Glucose, capillary     Status: None   Collection Time: 06/25/18  4:43 PM  Result Value Ref Range   Glucose-Capillary 76 70 - 99 mg/dL   Comment 1 Notify RN    Comment 2 Document in Chart   Glucose, capillary     Status: None   Collection Time: 06/25/18  8:18 PM  Result Value Ref Range   Glucose-Capillary 95 70 - 99 mg/dL  Glucose, capillary     Status: Abnormal   Collection Time: 06/26/18 12:05 AM  Result Value Ref Range   Glucose-Capillary 103 (H) 70 - 99 mg/dL  Glucose, capillary     Status: None    Collection Time: 06/26/18  5:17 AM  Result Value Ref Range   Glucose-Capillary 73 70 - 99 mg/dL  CBC     Status: Abnormal   Collection Time: 06/26/18  5:20 AM  Result Value Ref Range   WBC 4.0 4.0 - 10.5 K/uL  RBC 2.99 (L) 3.87 - 5.11 MIL/uL   Hemoglobin 9.2 (L) 12.0 - 15.0 g/dL   HCT 27.6 (L) 36.0 - 46.0 %   MCV 92.3 80.0 - 100.0 fL   MCH 30.8 26.0 - 34.0 pg   MCHC 33.3 30.0 - 36.0 g/dL   RDW 14.1 11.5 - 15.5 %   Platelets 159 150 - 400 K/uL   nRBC 0.0 0.0 - 0.2 %    Comment: Performed at [facility], [place] [address] [place], [place] [address]  Basic metabolic panel     Status: Abnormal   Collection Time: 06/26/18  5:20 AM  Result Value Ref Range   Sodium 136 135 - 145 mmol/L   Potassium 3.6 3.5 - 5.1 mmol/L   Chloride 103 98 - 111 mmol/L   CO2 21 (L) 22 - 32 mmol/L   Glucose, Bld 73 70 - 99 mg/dL   BUN 8 8 - 23 mg/dL   Creatinine, Ser 0.72 0.44 - 1.00 mg/dL   Calcium 8.4 (L) 8.9 - 10.3 mg/dL   GFR calc non Af Amer >60 >60 mL/min   GFR calc Af Amer >60 >60 mL/min   Anion gap 12 5 - 15    Comment: Performed at [facility], [place] [address] [place], [place] [address]  Glucose, capillary     Status: None   Collection Time: 06/26/18  7:47 AM  Result Value Ref Range   Glucose-Capillary 71 70 - 99 mg/dL   Comment 1 Notify RN    Comment 2 Document in Chart    Ct Maxillofacial W Contrast  Result Date: 06/25/2018 CLINICAL DATA:  Dental pain. Evaluation for abscess. EXAM: CT MAXILLOFACIAL WITH CONTRAST TECHNIQUE: Multidetector CT imaging of the maxillofacial structures was performed with intravenous contrast. Multiplanar CT image reconstructions were also generated. CONTRAST:  84m OMNIPAQUE IOHEXOL 300 MG/ML  SOLN COMPARISON:  None. FINDINGS: Osseous: Absent dentition aside from residual mandibular incisor tooth fragments with periapical lucency associated with the right central incisor. No evidence of associated subperiosteal abscess or  significant overlying soft tissue swelling. No fracture. Orbits: Bilateral cataract extraction. Sinuses: Small right maxillary sinus mucous retention cyst. Clear mastoid air cells. Soft tissues: Extensive bilateral carotid artery calcific atherosclerosis with potentially severe proximal ICA stenoses bilaterally. Assessment is limited by non angiographic technique and the dense calcification limiting visualization of the residual patent lumen. Limited intracranial: Carotid siphon and vertebral artery atherosclerosis. IMPRESSION: 1. Periapical lucency associated with the right central incisor tooth fragment. No evidence of subperiosteal abscess. 2. Extensive atherosclerosis with potentially severe bilateral ICA stenoses. Electronically Signed   By: ALogan BoresM.D.   On: 06/25/2018 17:05   Dg Abd Acute 2+v W 1v Chest  Result Date: 06/24/2018 CLINICAL DATA:  Hypotension. EXAM: DG ABDOMEN ACUTE W/ 1V CHEST COMPARISON:  12/19/2015 FINDINGS: The patient is status post prior median sternotomy. The heart size is stable. Aortic calcifications are noted. Chronic lung changes are noted bilaterally without evidence of a focal infiltrate or pneumothorax. There is no large pleural effusion. No acute osseous abnormality. The bowel gas pattern is nonobstructive. There is a large amount of stool throughout the colon. Phleboliths project over the patient's pelvis. Degenerative changes are noted of the lumbar spine. No radiographic evidence for pneumatosis or free air. IMPRESSION: 1. No acute cardiopulmonary process. 2. Nonobstructive bowel gas pattern. 3. Large amount of stool in the colon. Electronically Signed   By: CConstance HolsterM.D.   On: 06/24/2018 19:24   [place]  Abdomen Limited Ruq  Result Date: 06/25/2018 CLINICAL DATA:  Nausea and vomiting EXAM: ULTRASOUND ABDOMEN LIMITED RIGHT UPPER QUADRANT COMPARISON:  None. FINDINGS: Gallbladder: Cholelithiasis with gallbladder sludge. Gallbladder wall thickening measuring 5.6  mm. No pericholecystic fluid. Negative sonographic Murphy sign. Common bile duct: Diameter: 8.1 mm.  No choledocholithiasis. Liver: No focal lesion identified. Within normal limits in parenchymal echogenicity. Portal vein is patent on color Doppler imaging with normal direction of blood flow towards the liver. IMPRESSION: Cholelithiasis with gallbladder wall thickening concerning for acute cholecystitis. Electronically Signed   By: Kathreen Devoid   On: 06/25/2018 17:17   Anti-infectives (From admission, onward)   Start     Dose/Rate Route Frequency Ordered Stop   06/25/18 2000  cefTRIAXone (ROCEPHIN) 1 g in sodium chloride 0.9 % 100 mL IVPB  Status:  Discontinued     1 g 200 mL/hr over 30 Minutes Intravenous Every 24 hours 06/24/18 2154 06/25/18 1253   06/24/18 1945  cefTRIAXone (ROCEPHIN) 1 g in sodium chloride 0.9 % 100 mL IVPB     1 g 200 mL/hr over 30 Minutes Intravenous  Once 06/24/18 1944 06/24/18 2045        Assessment/Plan Dementia CAD with CABG in 2017 Ischemic cardiomyopathy (EF 40% [11/2015]) DM2 CKD  Hypothyroidism   Nausea and emesis Gallbladder wall thickening on [place] - [place] with Cholelithiasis with gallbladder wall thickening for acute cholecystitis. No pericholecystic fluid.  Common bile duct 8.1 mm.  No choledocholithiasis.  White blood cell count not elevated.  No LFT elevation.  T bili 1.0 on 6/18.  LFTs pending this morning. Will follow up. - No abdominal pain on exam. No reported abdominal pain since symptom onset.  - No indication for emergent or urgent surgery.  - Will obtain HIDA to rule out acute cholecystitis. Can place on abx for now and will d/c if negative.  - Patient would need cardiac clearance if surgery was to occur. Appears cardiology is getting echocardiogram  ID - Rocephin  VTE - SCDs, Lovenox FEN - NPO for HIDA  Addendum: POC - Mailen Newborn (son). The nurse asked that I call the patient's son. He would like to be the primary contact [phone]). He  was updated on the plan and is in agreement with this. He reports that he is not sure if she has been having any abdominal pain. He notes that her appetite has been reduced for about a year. He notes she has lost 35lb in the last year. N/V have worsened in the last 3-4 weeks.   Jillyn Ledger, [facility] 06/26/2018, 9:24 AM Pager: [phone]

## 2018-06-26 NOTE — Consult Note (Signed)
Cardiology Consultation:   Patient ID: [name] [name] MRN: [id number]; DOB: 05-26-1931  Admit date: 06/24/2018 Date of Consult: 06/26/2018  Primary Care Provider: [name], MD Primary Cardiologist: Candee Furbish, MD Primary Electrophysiologist:  None    Patient Profile:   [name] [name] is a 85 y.o. female with a hx of DMT2, anemia, CKD, hypothyroidism, GERD, HTN and CAD with CABG X 4 2017 and ICM with EF pre-op 25-30% 2017 and 40% post CABG who is being seen today for the evaluation of pre-op eval prior to surgey at the request of Dr. Maylene Roes.  History of Present Illness:   [name] with above hx and CAD with significant disease in 2017 and decreased EF underwent CABG by Dr. Roxan Hockey with LIMA to LAD, VG to OM, VG to Ramus intermedius, and VG to PDA.  We have not seen in office since post op visit.     Pt sent from PCP office with hypotension in 80 systolic and vomiting for 3 weeks no diarrhea. She had not been taking her meds.  She was given IV fluids. (PROGRESSIVE DEMENTIA OVER LAST YEAR.  Pt had refused to see MD until the 17th.)  Recent tooth pain with no abscess, but on CT maxillofacial + potentially severe bilateral ICA stenosis .  ( carotid dopplers 2017 with Rt internal carotid artery of Findings suggest upper range 40-59% versus low range 60-79% and 1-39% left internal carotid artery stenosis. Can not rule out higher grade stenosis due to large amounts of calcified plaque)  abd ultrasound with cholelithiasis with gallbladder wall thickening concerning for acute cholecystitis.   EKG:  The EKG was personally reviewed and demonstrates:  SR at 69 no ST changes  Telemetry:  Telemetry was personally reviewed and demonstrates:  SR to SB at 50.   Labs Na 136, K+ 3.6 Cr 0.72 Ca+ 6.4  Hgb 9.2, WBC 4.0 though was low on admit. plts 159, Iron 45 folate 3.3  TSH 0.288 Free T4 1.25  COVID neg.   KUB IMPRESSION: 1. No acute cardiopulmonary process. 2. Nonobstructive bowel gas  pattern. 3. Large amount of stool in the colon.  Currently BP 94/69 P 60 , No chest pain and no SOB   Past Medical History:  Diagnosis Date  . Anemia   . Anginal pain ([place])   . Cataract   . Colon polyp 2009    TUBULAR ADENOMA  . Coronary artery disease   . Depression   . Diabetes ([place])   . Diverticulosis of colon (without mention of hemorrhage) 2009  . Esophagitis 2009  . Gastritis 2009  . GERD (gastroesophageal reflux disease) 2009  . Hiatal hernia 2009  . Hyperlipemia   . Hypertension   . Thyroid disease    hypothyroid    Past Surgical History:  Procedure Laterality Date  . BREAST BIOPSY Left 1999   neg  . CARDIAC CATHETERIZATION N/A 11/15/2015   Procedure: Left Heart Cath and Coronary Angiography;  Surgeon: Troy Sine, MD;  Location: [place] [facility];  Service: Cardiovascular;  Laterality: N/A;  . COLONOSCOPY  05/2012   Dr. Sharlett Iles  . CORONARY ARTERY BYPASS GRAFT  11/16/2015   Procedure: OFF PUMP CORONARY ARTERY BYPASS GRAFTING (CABG) x FOUR, USING LEFT MAMMARY ARTERY  AND ENDOSCOPICALLY HARVESTED RIGHT GREATER SAPHENOUS VEIN.;  Surgeon: Melrose Nakayama, MD;  Location: [place];  Service: Open Heart Surgery;;  . DILATION AND CURETTAGE OF UTERUS  1984  . TEE WITHOUT CARDIOVERSION N/A 11/16/2015   Procedure: TRANSESOPHAGEAL  ECHOCARDIOGRAM (TEE);  Surgeon: Melrose Nakayama, MD;  Location: [place];  Service: Open Heart Surgery;  Laterality: N/A;     Home Medications:  Prior to Admission medications   Medication Sig Start Date End Date Taking? Authorizing Provider  aspirin EC 325 MG EC tablet Take 1 tablet (325 mg total) by mouth daily. 11/25/15  Yes Barrett, Erin R, PA-C  atorvastatin (LIPITOR) 80 MG tablet TAKE 1 TABLET DAILY FOR CHOLESTEROL 08/22/17  Yes Liane Comber, NP  metFORMIN (GLUCOPHAGE-XR) 500 MG 24 hr tablet TAKE 2 TABLETS BY MOUTH TWICE A DAY Patient taking differently: Take 500 mg by mouth 2 (two) times a day.  12/23/16  Yes Liane Comber, NP   mirtazapine (REMERON) 15 MG tablet TAKE 1 TABLET ONE HOUR BEFORE SLEEP Patient taking differently: Take 15 mg by mouth at bedtime. TAKE 1 TABLET ONE HOUR BEFORE SLEEP 06/12/18  Yes Liane Comber, NP  verapamil (CALAN-SR) 240 MG CR tablet Take 1 tablet daily with food for BP - needs OV before further refills 04/20/18  Yes [name], MD  blood glucose meter kit and supplies KIT Dispense based on patient and insurance preference. Check blood sugar 1 time daily-[id number]. 12/08/15   [name], MD  nitroGLYCERIN (NITROSTAT) 0.4 MG SL tablet Dissolve 1 tablet under tongue every 3 to 5 minutes if needed for ChestPain 10/31/15 01/27/17  [name], MD  ondansetron (ZOFRAN ODT) 8 MG disintegrating tablet Dissolve 1 tablet under tongue every 6 hours for nausea  or vomitting 06/23/18   [name], MD  polyethylene glycol [facility] / Floria Raveling) packet Take 17 g by mouth daily. Patient taking differently: Take 17 g by mouth daily as needed for moderate constipation.  10/28/17   Vicie Mutters, PA-C   Not sure she was taking any per notes   Inpatient Medications: Scheduled Meds: . aspirin  325 mg Oral Daily  . atorvastatin  80 mg Oral q1800  . enoxaparin (LOVENOX) injection  30 mg Subcutaneous QHS  . feeding supplement (ENSURE ENLIVE)  237 mL Oral BID BM  . folic acid  1 mg Oral Daily  . insulin aspart  0-9 Units Subcutaneous Q4H  . mirtazapine  15 mg Oral QHS  . senna  1 tablet Oral BID  . sodium chloride flush  3 mL Intravenous Q12H  . sodium chloride flush  3 mL Intravenous Q12H   Continuous Infusions: . sodium chloride 10 mL/hr at 06/25/18 0500   PRN Meds: sodium chloride, acetaminophen **OR** acetaminophen, bisacodyl, HYDROcodone-acetaminophen, ondansetron **OR** ondansetron (ZOFRAN) IV, polyethylene glycol, promethazine, sodium chloride flush, sodium phosphate  Allergies:    Allergies  Allergen Reactions  . Bee Venom Anaphylaxis and Other (See Comments)    Eyes swell,  cannot breathe  . Persantine [Dipyridamole] Other (See Comments)    "went crazy"  . Actos [Pioglitazone] Other (See Comments)    UNSPECIFIED REACTION     Social History:   Social History   Socioeconomic History  . Marital status: Married    Spouse name: Not on file  . Number of children: 3  . Years of education: Not on file  . Highest education level: Not on file  Occupational History  . Not on file  Social Needs  . Financial resource strain: Not on file  . Food insecurity    Worry: Not on file    Inability: Not on file  . Transportation needs    Medical: Not on file    Non-medical: Not on file  Tobacco Use  .  Smoking status: Never Smoker  . Smokeless tobacco: Current User    Types: Snuff  Substance and Sexual Activity  . Alcohol use: No  . Drug use: No  . Sexual activity: Not on file  Lifestyle  . Physical activity    Days per week: Not on file    Minutes per session: Not on file  . Stress: Not on file  Relationships  . Social Herbalist on phone: Not on file    Gets together: Not on file    Attends religious service: Not on file    Active member of club or organization: Not on file    Attends meetings of clubs or organizations: Not on file    Relationship status: Not on file  . Intimate partner violence    Fear of current or ex partner: Not on file    Emotionally abused: Not on file    Physically abused: Not on file    Forced sexual activity: Not on file  Other Topics Concern  . Not on file  Social History Narrative  . Not on file    Family History:    Family History  Problem Relation Age of Onset  . Diabetes Father   . Stroke Mother   . Breast cancer Maternal Aunt   . Throat cancer Maternal Uncle   . Lung cancer Brother   . Prostate cancer Brother   . Breast cancer Maternal Grandmother   . Rectal cancer Neg Hx   . Stomach cancer Neg Hx   . Colon cancer Neg Hx   . Esophageal cancer Neg Hx      ROS:  Please see the history of  present illness.  General:no colds or fevers, no weight changes Skin:no rashes or ulcers HEENT:no blurred vision, no congestion CV:see HPI PUL:see HPI GI:no diarrhea constipation or melena, no indigestion GU:no hematuria, no dysuria MS:no joint pain, no claudication Neuro:no syncope, no lightheadedness Endo:+ diabetes, no thyroid disease  All other ROS reviewed and negative.     Physical Exam/Data:   Vitals:   06/25/18 0429 06/25/18 2135 06/25/18 2137 06/26/18 0658  BP: (!) 91/54 (!) 94/49 94/69   Pulse: 68  60   Resp: 19  16   Temp: 98 F (36.7 C)  98.2 F (36.8 C)   TempSrc: Oral     SpO2: 98%  98%   Weight: 53.6 kg   47.2 kg   No intake or output data in the 24 hours ending 06/26/18 0822 Last 3 Weights 06/26/2018 06/25/2018 06/24/2018  Weight (lbs) 104 lb 0.9 oz 118 lb 2.7 oz 109 lb 6.4 oz  Weight (kg) 47.2 kg 53.6 kg 49.624 kg     Body mass index is 19.66 kg/m.  General:  Well nourished, well developed, in no acute distress HEENT: normal Lymph: no adenopathy Neck: no JVD Endocrine:  No thryomegaly Vascular: + carotid bruits R>L; pedal pulses 1+ bilaterally   Cardiac:  normal S1, S2; RRR; 2-8/7 systolic murmur of aortic valve Lungs:  clear to auscultation bilaterally, no wheezing, rhonchi or rales  Abd: soft, mild tenderness upper abd, no hepatomegaly  Ext: no edema Musculoskeletal:  No deformities, BUE and BLE strength normal and equal Skin: warm and dry  Neuro:  oriented to person,  And place and read calender for date. no focal abnormalities noted Psych:  Normal affect   Relevant CV Studies: Cardiac cath 8676  LV end diastolic pressure is normal.  There is moderate left ventricular systolic dysfunction.  Ramus lesion, 80 %stenosed.  Ost Ramus lesion, 70 %stenosed.  Ost LAD lesion, 80 %stenosed.  Prox LAD to Mid LAD lesion, 50 %stenosed.  Mid LAD lesion, 95 %stenosed.  Dist LAD lesion, 30 %stenosed.  Ost Cx lesion, 85 %stenosed.  Prox Cx  lesion, 95 %stenosed.  Ost RCA lesion, 95 %stenosed.  Prox RCA lesion, 95 %stenosed.  Mid RCA lesion, 60 %stenosed.   Moderate LV dysfunction with an ejection fraction of 35-40% with a focal area of upper mid anterolateral hypocontractility and mid to basal inferior hypokinesis.  Severely calcified coronary arteries with severe multivessel CAD with 80% ostial LAD stenosis followed by diffuse narrowing of 50%.  Prior to a 95% proximal to mid stenosis with 30% mid distal stenosis; 70% ostial 80% proximal ramus intermediate stenosis; 80% proximal followed by 95% left circumflex stenosis; an ostial 95% RCA stenosis followed by proximal 95% and mid 60% stenoses.  RECOMMENDATION: Surgical consultation for CABG revascularization surgery.  Echo TEE  Result status: Final result    Left ventricle: Normal cavity size and wall thickness. LV systolic function is severely reduced with an EF of 25-30%. Wall motion is abnormal. Inferior wall motion is hypokinetic. The septal wall is severely hypokinetic.  Aortic valve: The valve is trileaflet. Mild valve thickening present. No stenosis. Mild regurgitation.  Mitral valve: No leaflet thickening and calcification present. Mild regurgitation.  Tricuspid valve: Mild to moderate regurgitation. The tricuspid valve regurgitation jet is directed toward septum.   Post Coronary Bypass grafting, the inferior wall contractility is improving.  The septum remains severely hypokinetic. The overall EF has improved to approximately 40%.       Laboratory Data:  Chemistry Recent Labs  Lab 06/24/18 1739 06/25/18 0526 06/26/18 0520  NA 131* 135 136  K 3.7 3.6 3.6  CL 98 102 103  CO2 22 23 21*  GLUCOSE 136* 103* 73  BUN _0 CREATININE 0.83 0.76 0.72  CALCIUM 8.4* 8.1* 8.4*  GFRNONAA >60 >60 >60  GFRAA >60 >60 >60  ANIONGAP _1 Recent Labs  Lab 06/24/18 1739 06/25/18 0526  PROT 7.1 6.4*  ALBUMIN 3.9 3.5  AST 16 13*  ALT 9 9   ALKPHOS 33* 31*  BILITOT 1.0 1.0   Hematology Recent Labs  Lab 06/24/18 1739 06/24/18 2234 06/25/18 0526 06/26/18 0520  WBC 3.6*  --  3.3* 4.0  RBC 3.59* 3.30* 3.03* 2.99*  HGB 11.0*  --  9.3* 9.2*  HCT 32.6*  --  27.9* 27.6*  MCV 90.8  --  92.1 92.3  MCH 30.6  --  30.7 30.8  MCHC 33.7  --  33.3 33.3  RDW 13.8  --  13.9 14.1  PLT 182  --  159 159   Cardiac EnzymesNo results for input(s): TROPONINI in the last 168 hours. No results for input(s): TROPIPOC in the last 168 hours.  BNPNo results for input(s): BNP, PROBNP in the last 168 hours.  DDimer No results for input(s): DDIMER in the last 168 hours.  Radiology/Studies:  Ct Maxillofacial W Contrast  Result Date: 06/25/2018 CLINICAL DATA:  Dental pain. Evaluation for abscess. EXAM: CT MAXILLOFACIAL WITH CONTRAST TECHNIQUE: Multidetector CT imaging of the maxillofacial structures was performed with intravenous contrast. Multiplanar CT image reconstructions were also generated. CONTRAST:  88m OMNIPAQUE IOHEXOL 300 MG/ML  SOLN COMPARISON:  None. FINDINGS: Osseous: Absent dentition aside from residual mandibular incisor tooth fragments with periapical lucency associated with the right central incisor. No evidence of associated  subperiosteal abscess or significant overlying soft tissue swelling. No fracture. Orbits: Bilateral cataract extraction. Sinuses: Small right maxillary sinus mucous retention cyst. Clear mastoid air cells. Soft tissues: Extensive bilateral carotid artery calcific atherosclerosis with potentially severe proximal ICA stenoses bilaterally. Assessment is limited by non angiographic technique and the dense calcification limiting visualization of the residual patent lumen. Limited intracranial: Carotid siphon and vertebral artery atherosclerosis. IMPRESSION: 1. Periapical lucency associated with the right central incisor tooth fragment. No evidence of subperiosteal abscess. 2. Extensive atherosclerosis with potentially  severe bilateral ICA stenoses. Electronically Signed   By: Logan Bores M.D.   On: 06/25/2018 17:05   Dg Abd Acute 2+v W 1v Chest  Result Date: 06/24/2018 CLINICAL DATA:  Hypotension. EXAM: DG ABDOMEN ACUTE W/ 1V CHEST COMPARISON:  12/19/2015 FINDINGS: The patient is status post prior median sternotomy. The heart size is stable. Aortic calcifications are noted. Chronic lung changes are noted bilaterally without evidence of a focal infiltrate or pneumothorax. There is no large pleural effusion. No acute osseous abnormality. The bowel gas pattern is nonobstructive. There is a large amount of stool throughout the colon. Phleboliths project over the patient's pelvis. Degenerative changes are noted of the lumbar spine. No radiographic evidence for pneumatosis or free air. IMPRESSION: 1. No acute cardiopulmonary process. 2. Nonobstructive bowel gas pattern. 3. Large amount of stool in the colon. Electronically Signed   By: Constance Holster M.D.   On: 06/24/2018 19:24   US Abdomen Limited Ruq  Result Date: 06/25/2018 CLINICAL DATA:  Nausea and vomiting EXAM: ULTRASOUND ABDOMEN LIMITED RIGHT UPPER QUADRANT COMPARISON:  None. FINDINGS: Gallbladder: Cholelithiasis with gallbladder sludge. Gallbladder wall thickening measuring 5.6 mm. No pericholecystic fluid. Negative sonographic Murphy sign. Common bile duct: Diameter: 8.1 mm.  No choledocholithiasis. Liver: No focal lesion identified. Within normal limits in parenchymal echogenicity. Portal vein is patent on color Doppler imaging with normal direction of blood flow towards the liver. IMPRESSION: Cholelithiasis with gallbladder wall thickening concerning for acute cholecystitis. Electronically Signed   By: Kathreen Devoid   On: 06/25/2018 17:17    Assessment and Plan:   1. Pre-op eval for pt with possible acute cholecystitis and with cholelithiasis. With hx of CABG in 2017 and no cardiac follow up since, also with increasing dementia.   EKG without acute issues  though at times HR to 50 - would recommend echo with prior cardiomyopathy Dr. Radford Pax to see. 2. + murmur probable AS echo pending  3. CAD with CABG in 2017, no cardiac follow up - presumed stable  4. ICM in 2017 will check echo to re-eval with surgery  5. Dementia progressive  6. HLD unsure if taking meds now in Oct LDL of 66  7. Carotid disease with Rt carotid bruit possible radiation of murmur. But does have known CAD Rt > Lt  8. DM-2 per IM      For questions or updates, please contact [place] Please consult [email or web address] for contact info under     Signed, Cecilie Kicks, NP  06/26/2018 8:22 AM

## 2018-06-27 ENCOUNTER — Inpatient Hospital Stay (HOSPITAL_COMMUNITY): Payer: Medicare Other

## 2018-06-27 DIAGNOSIS — Z01810 Encounter for preprocedural cardiovascular examination: Secondary | ICD-10-CM

## 2018-06-27 LAB — HEPATIC FUNCTION PANEL
ALT: 9 U/L (ref 0–44)
AST: 12 U/L — ABNORMAL LOW (ref 15–41)
Albumin: 3.2 g/dL — ABNORMAL LOW (ref 3.5–5.0)
Alkaline Phosphatase: 30 U/L — ABNORMAL LOW (ref 38–126)
Bilirubin, Direct: 0.2 mg/dL (ref 0.0–0.2)
Indirect Bilirubin: 0.5 mg/dL (ref 0.3–0.9)
Total Bilirubin: 0.7 mg/dL (ref 0.3–1.2)
Total Protein: 5.9 g/dL — ABNORMAL LOW (ref 6.5–8.1)

## 2018-06-27 LAB — CBC
HCT: 27.3 % — ABNORMAL LOW (ref 36.0–46.0)
Hemoglobin: 8.9 g/dL — ABNORMAL LOW (ref 12.0–15.0)
MCH: 30.3 pg (ref 26.0–34.0)
MCHC: 32.6 g/dL (ref 30.0–36.0)
MCV: 92.9 fL (ref 80.0–100.0)
Platelets: 151 10*3/uL (ref 150–400)
RBC: 2.94 MIL/uL — ABNORMAL LOW (ref 3.87–5.11)
RDW: 14.1 % (ref 11.5–15.5)
WBC: 4.4 10*3/uL (ref 4.0–10.5)
nRBC: 0 % (ref 0.0–0.2)

## 2018-06-27 LAB — GLUCOSE, CAPILLARY
Glucose-Capillary: 101 mg/dL — ABNORMAL HIGH (ref 70–99)
Glucose-Capillary: 108 mg/dL — ABNORMAL HIGH (ref 70–99)
Glucose-Capillary: 110 mg/dL — ABNORMAL HIGH (ref 70–99)
Glucose-Capillary: 116 mg/dL — ABNORMAL HIGH (ref 70–99)
Glucose-Capillary: 145 mg/dL — ABNORMAL HIGH (ref 70–99)
Glucose-Capillary: 98 mg/dL (ref 70–99)

## 2018-06-27 LAB — BASIC METABOLIC PANEL
Anion gap: 10 (ref 5–15)
BUN: 8 mg/dL (ref 8–23)
CO2: 23 mmol/L (ref 22–32)
Calcium: 8.3 mg/dL — ABNORMAL LOW (ref 8.9–10.3)
Chloride: 105 mmol/L (ref 98–111)
Creatinine, Ser: 0.6 mg/dL (ref 0.44–1.00)
GFR calc Af Amer: >60 mL/min (ref >60)
GFR calc non Af Amer: >60 mL/min (ref >60)
Glucose, Bld: 127 mg/dL — ABNORMAL HIGH (ref 70–99)
Potassium: 3.2 mmol/L — ABNORMAL LOW (ref 3.5–5.1)
Sodium: 138 mmol/L (ref 135–145)

## 2018-06-27 LAB — CULTURE, BLOOD (ROUTINE X 2): Special Requests: ADEQUATE

## 2018-06-27 MED ORDER — POTASSIUM CHLORIDE 20 MEQ/15ML (10%) PO SOLN
40.0000 meq | Freq: Once | ORAL | Status: AC
  Administered 2018-06-27: 40 meq via ORAL
  Filled 2018-06-27: qty 30

## 2018-06-27 MED ORDER — METOCLOPRAMIDE HCL 5 MG/ML IJ SOLN
5.0000 mg | Freq: Three times a day (TID) | INTRAMUSCULAR | Status: DC
  Administered 2018-06-27 – 2018-07-01 (×12): 5 mg via INTRAVENOUS
  Filled 2018-06-27 (×12): qty 2

## 2018-06-27 NOTE — Progress Notes (Signed)
Progress Note  Patient Name: [name] [name] Date of Encounter: 06/27/2018  Primary Cardiologist:   Candee Furbish, MD   Subjective   Very quiet.  Denies pain or SOB.    Inpatient Medications    Scheduled Meds: . aspirin  325 mg Oral Daily  . atorvastatin  80 mg Oral q1800  . enoxaparin (LOVENOX) injection  30 mg Subcutaneous QHS  . feeding supplement (ENSURE ENLIVE)  237 mL Oral BID BM  . folic acid  1 mg Oral Daily  . insulin aspart  0-9 Units Subcutaneous Q4H  . metoCLOPramide (REGLAN) injection  5 mg Intravenous Q8H  . mirtazapine  15 mg Oral QHS  . senna  1 tablet Oral BID  . sodium chloride flush  3 mL Intravenous Q12H  . sodium chloride flush  3 mL Intravenous Q12H   Continuous Infusions: . sodium chloride 10 mL/hr at 06/25/18 0500  . dextrose 5 % and 0.45% NaCl Stopped (06/27/18 7209)   PRN Meds: sodium chloride, acetaminophen **OR** acetaminophen, bisacodyl, HYDROcodone-acetaminophen, ondansetron **OR** ondansetron (ZOFRAN) IV, polyethylene glycol, promethazine, sodium chloride flush, sodium phosphate   Vital Signs    Vitals:   06/26/18 1525 06/26/18 2009 06/27/18 0428 06/27/18 0500  BP: (!) 92/43 (!) 114/55 (!) 96/43   Pulse: 68 67 63   Resp: 16 16 16    Temp: 98.1 F (36.7 C) 97.9 F (36.6 C) 98.2 F (36.8 C)   TempSrc: Oral Oral Oral   SpO2: 100% 100%    Weight:    51.5 kg    Intake/Output Summary (Last 24 hours) at 06/27/2018 1026 Last data filed at 06/27/2018 0700 Gross per 24 hour  Intake 1237.45 ml  Output -  Net 1237.45 ml   Filed Weights   06/25/18 0429 06/26/18 0658 06/27/18 0500  Weight: 53.6 kg 47.2 kg 51.5 kg    Telemetry    NA - Personally Reviewed  ECG    NA - Personally Reviewed  Physical Exam   GEN: No acute distress.   Neck: No  JVD Cardiac: RRR, 3/6 mid peaking systolic murmur, no diastolic murmurs, rubs, or gallops.  Respiratory: Clear  to auscultation bilaterally. GI: Soft, mildly tender, non-distended  MS: No   edema; No deformity. Neuro:  Nonfocal  Psych: Normal affect   Labs    Chemistry Recent Labs  Lab 06/25/18 0526 06/26/18 0520 06/27/18 0504  NA 135 136 138  K 3.6 3.6 3.2*  CL 102 103 105  CO2 23 21* 23  GLUCOSE 103* 73 127*  BUN 11 8 8   CREATININE 0.76 0.72 0.60  CALCIUM 8.1* 8.4* 8.3*  PROT 6.4* 6.2* 5.9*  ALBUMIN 3.5 3.4* 3.2*  AST 13* 14* 12*  ALT 9 9 9   ALKPHOS 31* 31* 30*  BILITOT 1.0 1.3* 0.7  GFRNONAA >60 >60 >60  GFRAA >60 >60 >60  ANIONGAP 10 12 10      Hematology Recent Labs  Lab 06/25/18 0526 06/26/18 0520 06/27/18 0504  WBC 3.3* 4.0 4.4  RBC 3.03* 2.99* 2.94*  HGB 9.3* 9.2* 8.9*  HCT 27.9* 27.6* 27.3*  MCV 92.1 92.3 92.9  MCH 30.7 30.8 30.3  MCHC 33.3 33.3 32.6  RDW 13.9 14.1 14.1  PLT 159 159 151    Cardiac EnzymesNo results for input(s): TROPONINI in the last 168 hours. No results for input(s): TROPIPOC in the last 168 hours.   BNPNo results for input(s): BNP, PROBNP in the last 168 hours.   DDimer No results for input(s): DDIMER in the  last 168 hours.   Radiology    Nm Hepato W/eject Fract  Result Date: 06/26/2018 CLINICAL DATA:  Kirt Boys is cyst is EXAM: NUCLEAR MEDICINE HEPATOBILIARY IMAGING WITH GALLBLADDER EF TECHNIQUE: Sequential images of the abdomen were obtained out to 60 minutes following intravenous administration of radiopharmaceutical. After oral ingestion of Ensure, gallbladder ejection fraction was determined. At 60 min, normal ejection fraction is greater than 33%. RADIOPHARMACEUTICALS:  5.5 mCi Tc-34m  Choletec IV COMPARISON:  Ultrasound 06/25/2018 FINDINGS: Prompt uptake and biliary excretion of activity by the liver is seen. Gallbladder activity is visualized, consistent with patency of cystic duct. Biliary activity passes into small bowel, consistent with patent common bile duct. Motion patient had difficulty remaining still during the ejection fraction phase. Calculated gallbladder ejection fraction is greater than 50%. (Normal  gallbladder ejection fraction with Ensure is greater than 33%.) IMPRESSION: 1. Normal gallbladder filling. Patent cystic duct and common bile duct. 2. Normal ejection fraction. Electronically Signed   By: Suzy Bouchard M.D.   On: 06/26/2018 16:46   Ct Maxillofacial W Contrast  Result Date: 06/25/2018 CLINICAL DATA:  Dental pain. Evaluation for abscess. EXAM: CT MAXILLOFACIAL WITH CONTRAST TECHNIQUE: Multidetector CT imaging of the maxillofacial structures was performed with intravenous contrast. Multiplanar CT image reconstructions were also generated. CONTRAST:  71mL OMNIPAQUE IOHEXOL 300 MG/ML  SOLN COMPARISON:  None. FINDINGS: Osseous: Absent dentition aside from residual mandibular incisor tooth fragments with periapical lucency associated with the right central incisor. No evidence of associated subperiosteal abscess or significant overlying soft tissue swelling. No fracture. Orbits: Bilateral cataract extraction. Sinuses: Small right maxillary sinus mucous retention cyst. Clear mastoid air cells. Soft tissues: Extensive bilateral carotid artery calcific atherosclerosis with potentially severe proximal ICA stenoses bilaterally. Assessment is limited by non angiographic technique and the dense calcification limiting visualization of the residual patent lumen. Limited intracranial: Carotid siphon and vertebral artery atherosclerosis. IMPRESSION: 1. Periapical lucency associated with the right central incisor tooth fragment. No evidence of subperiosteal abscess. 2. Extensive atherosclerosis with potentially severe bilateral ICA stenoses. Electronically Signed   By: Logan Bores M.D.   On: 06/25/2018 17:05   US Abdomen Limited Ruq  Result Date: 06/25/2018 CLINICAL DATA:  Nausea and vomiting EXAM: ULTRASOUND ABDOMEN LIMITED RIGHT UPPER QUADRANT COMPARISON:  None. FINDINGS: Gallbladder: Cholelithiasis with gallbladder sludge. Gallbladder wall thickening measuring 5.6 mm. No pericholecystic fluid. Negative  sonographic Murphy sign. Common bile duct: Diameter: 8.1 mm.  No choledocholithiasis. Liver: No focal lesion identified. Within normal limits in parenchymal echogenicity. Portal vein is patent on color Doppler imaging with normal direction of blood flow towards the liver. IMPRESSION: Cholelithiasis with gallbladder wall thickening concerning for acute cholecystitis. Electronically Signed   By: Kathreen Devoid   On: 06/25/2018 17:17    Cardiac Studies   ECHO:    06/27/18 1. The left ventricle has normal systolic function with an ejection fraction of 60-65%. The cavity size was normal. Left ventricular diastolic Doppler parameters are indeterminate.  2. The right ventricle has normal systolic function. The cavity was normal. There is no increase in right ventricular wall thickness. Right ventricular systolic pressure could not be assessed.  3. There is mild mitral annular calcification present. No evidence of mitral valve stenosis.  4. The aortic valve is tricuspid. Moderate thickening of the aortic valve. Moderate calcification of the aortic valve. Aortic valve regurgitation is moderate by color flow Doppler. Mild stenosis of the aortic valve.  5. The ascending aorta is normal in size and structure.  Patient Profile  85 y.o. female  with a hx of DMT2, anemia, CKD, hypothyroidism, GERD, HTN and CAD with CABG X 4 2017 and ICM with EF pre-op 25-30% 2017 and 40% post CABG who is being seen for the evaluation of pre-op eval prior to surgey at the request of Dr. Maylene Roes.   Assessment & Plan    Pre-op eval for pt with possible acute cholecystitis and with cholelithiasis:  It does not look like surgery is pending at this point and it does not appear that the gallbladder was the issue.  No further need for preop clearance.   AS:  Moderate regurgitation and mild stenosis.  No change in therapy.    CAD with CABG in 2017, no cardiac follow up - presumed stable   ICM in 2017:  EF normal.  No change in  therapy or further work up.    Carotid disease:   Doppler results pending.    DM-2 :  A1C 6.1.  Plan per primary team.   HTN:  BP running low.  Will likely go home without BP meds and will need follow up by her PCP.    [facility] will sign off.   Medication Recommendations:  As above Other recommendations (labs, testing, etc):  None Follow up as an outpatient:  We will arrange follow up in the office in 3 months or so given her prior cardiac history.       For questions or updates, please contact [place] Please consult [email or web address] for contact info under Cardiology/STEMI.   Signed, Minus Breeding, MD  06/27/2018, 10:26 AM

## 2018-06-27 NOTE — Progress Notes (Signed)
PROGRESS NOTE    [name] [name]  VZC:[id number] DOB: 1931/11/14 DOA: 06/24/2018 PCP: [name], MD     Brief Narrative:  [name] [name] is a 85 y.o. female with medical history significant for dementia, coronary artery disease, ischemic cardiomyopathy, hypothyroidism, and type 2 diabetes mellitus, now presenting to emergency department for evaluation of nausea, vomiting, and low blood pressure.  Patient has reportedly been experiencing nausea with nonbloody vomiting for the past 3 to 4 weeks, has been able to tolerate cola, but has vomited anytime she attempts to drink water or eat.  She has not been taking her medications "for a long time" per report of her son.  She was taken to see her PCP for evaluation of these complaints, was noted to have a blood pressure of 84/52, and was directed to the ED for further evaluation.  She was given 250 cc of IV fluids with EMS prior to arrival in the ED. patient denies abdominal pain, diarrhea, fevers, or chills.  She denies any dysphasia. KUB features a nonobstructive bowel gas pattern with large amount of stool in the colon. Patient underwent RUQ [place] which was concerning for cholelithiasis with cholecystitis.  General surgery as well as cardiology were consulted for cardiac clearance.    New events last 24 hours / Subjective: Patient underwent HIDA scan which was normal.  General surgery does not feel patient's gallbladder is her primary issue for symptoms.  Possibly gastroparesis.  This morning, patient has no complaints.  She has so far been on clear liquid diet without any episodes of nausea or vomiting.  Assessment & Plan:   Principal Problem:   Hypotension due to hypovolemia Active Problems:   Essential hypertension   Hypothyroidism   Coronary artery disease   Mild dementia (HCC)   Type 2 diabetes mellitus (HCC)   Nausea & vomiting   Leukopenia   History of ischemic cardiomyopathy   Hyponatremia   Intractable nausea and vomiting -KUB  is negative for obstruction but notable for heavy colonic stool burden  -RUQ US revealed cholelithiasis with gallbladder wall thickening concerning for acute cholecystitis -HIDA scan with normal gallbladder filling, normal EF.  Stop Rocephin, Flagyl -General surgery consulted and signed off 6/20 -Possibly secondary to gastroparesis from longstanding diabetes, would recommend gastric emptying study as an outpatient.  Start empiric Reglan.  Advance diet to full liquid today  Hypotension  -Patient presents from PCP office where she was seen for 3-4 wks of N/V and found to have SBP in low 80's.  This is likely secondary to hypovolemia in setting of persistent nausea and vomiting.  Blood pressure has improved after IV fluids -Hold lisinopril, verapamil, imdur until BP further stabilized  -BP this morning low but stable 96/43 -Continue IV fluids until p.o. intake adequate  Type II DM  -Hemoglobin A1c 6.1  -Hold metformin  -Sliding scale insulin  CAD; history of ischemic cardiomyopathy, s/p CABG  -Stable. Continue aspirin and Lipitor -Appreciate cardiology.  Echocardiogram with EF 60 to 65%, moderate thickening of aortic valve  Hyponatremia  -Resolved with IV fluids  History of hypothyroidism -Free T4 1.25, TSH 0.288. Patient does not remember if she is on thyroid medications. Per chart review, it looks like it was discontinued back in 2015. Patient also unsure if she takes biotin supplements. Would repeat labs as outpatient in several weeks.   Dementia -Follows Neurology Dr. Delice Lesch. Slowly progressed over the past year    Dental pain -CT maxillofacial reveals periapical lucency right central incisor, no  subperiosteal abscess. Follow up with primary dentist outpatient   Pyuria -Initially concerned about UTI, UA with moderate leukocytes and rare bacteria, 6-10 WBC. No complaints of dysuria. Stop rocephin.   Hypokalemia -Replace, trend   DVT prophylaxis: Lovenox Code Status:  Full Family Communication: Called son with update this morning Disposition Plan: Pending improvement of oral intake.  Start IV Reglan today.  PT OT recommending home health on discharge.   Consultants:   General surgery  Cardiology  Procedures:   None   Antimicrobials:  Anti-infectives (From admission, onward)   Start     Dose/Rate Route Frequency Ordered Stop   06/27/18 0600  metroNIDAZOLE (FLAGYL) IVPB 500 mg  Status:  Discontinued     500 mg 100 mL/hr over 60 Minutes Intravenous Every 8 hours 06/26/18 1825 06/27/18 0910   06/26/18 1845  metroNIDAZOLE (FLAGYL) IVPB 500 mg  Status:  Discontinued     500 mg 100 mL/hr over 60 Minutes Intravenous  Once 06/26/18 1825 06/27/18 0910   06/26/18 1200  cefTRIAXone (ROCEPHIN) 2 g in sodium chloride 0.9 % 100 mL IVPB  Status:  Discontinued     2 g 200 mL/hr over 30 Minutes Intravenous Every 24 hours 06/26/18 1025 06/27/18 0910   06/25/18 2000  cefTRIAXone (ROCEPHIN) 1 g in sodium chloride 0.9 % 100 mL IVPB  Status:  Discontinued     1 g 200 mL/hr over 30 Minutes Intravenous Every 24 hours 06/24/18 2154 06/25/18 1253   06/24/18 1945  cefTRIAXone (ROCEPHIN) 1 g in sodium chloride 0.9 % 100 mL IVPB     1 g 200 mL/hr over 30 Minutes Intravenous  Once 06/24/18 1944 06/24/18 2045       Objective: Vitals:   06/26/18 1525 06/26/18 2009 06/27/18 0428 06/27/18 0500  BP: (!) 92/43 (!) 114/55 (!) 96/43   Pulse: 68 67 63   Resp: 16 16 16    Temp: 98.1 F (36.7 C) 97.9 F (36.6 C) 98.2 F (36.8 C)   TempSrc: Oral Oral Oral   SpO2: 100% 100%    Weight:    51.5 kg    Intake/Output Summary (Last 24 hours) at 06/27/2018 0931 Last data filed at 06/27/2018 0700 Gross per 24 hour  Intake 1237.45 ml  Output -  Net 1237.45 ml   Filed Weights   06/25/18 0429 06/26/18 0658 06/27/18 0500  Weight: 53.6 kg 47.2 kg 51.5 kg    Examination: General exam: Appears calm and comfortable  Respiratory system: Clear to auscultation. Respiratory effort  normal. Cardiovascular system: S1 & S2 heard, RRR. + Murmur Gastrointestinal system: Abdomen is nondistended, soft and nontender.  Central nervous system: Alert  Extremities: Symmetric Skin: No rashes, lesions or ulcers Psychiatry: Dementia   Data Reviewed: I have personally reviewed following labs and imaging studies  CBC: Recent Labs  Lab 06/24/18 1739 06/25/18 0526 06/26/18 0520 06/27/18 0504  WBC 3.6* 3.3* 4.0 4.4  NEUTROABS 1.6* 1.3*  --   --   HGB 11.0* 9.3* 9.2* 8.9*  HCT 32.6* 27.9* 27.6* 27.3*  MCV 90.8 92.1 92.3 92.9  PLT 182 159 159 798   Basic Metabolic Panel: Recent Labs  Lab 06/24/18 1739 06/25/18 0526 06/26/18 0520 06/27/18 0504  NA 131* 135 136 138  K 3.7 3.6 3.6 3.2*  CL 98 102 103 105  CO2 22 23 21* 23  GLUCOSE 136* 103* 73 127*  BUN 13 11 8 8   CREATININE 0.83 0.76 0.72 0.60  CALCIUM 8.4* 8.1* 8.4* 8.3*   GFR: Estimated  Creatinine Clearance: 39.5 mL/min (by C-G formula based on SCr of 0.6 mg/dL). Liver Function Tests: Recent Labs  Lab 06/24/18 1739 06/25/18 0526 06/26/18 0520 06/27/18 0504  AST 16 13* 14* 12*  ALT 9 9 9 9   ALKPHOS 33* 31* 31* 30*  BILITOT 1.0 1.0 1.3* 0.7  PROT 7.1 6.4* 6.2* 5.9*  ALBUMIN 3.9 3.5 3.4* 3.2*   Recent Labs  Lab 06/24/18 1739  LIPASE 70*   No results for input(s): AMMONIA in the last 168 hours. Coagulation Profile: No results for input(s): INR, PROTIME in the last 168 hours. Cardiac Enzymes: No results for input(s): CKTOTAL, CKMB, CKMBINDEX, TROPONINI in the last 168 hours. BNP (last 3 results) No results for input(s): PROBNP in the last 8760 hours. HbA1C: Recent Labs    06/25/18 0526  HGBA1C 6.1*   CBG: Recent Labs  Lab 06/26/18 1622 06/26/18 2011 06/27/18 0044 06/27/18 0426 06/27/18 0748  GLUCAP 158* 198* 101* 110* 98   Lipid Profile: No results for input(s): CHOL, HDL, LDLCALC, TRIG, CHOLHDL, LDLDIRECT in the last 72 hours. Thyroid Function Tests: Recent Labs    06/24/18 2234  06/25/18 0526  TSH 0.288*  --   FREET4  --  1.25*   Anemia Panel: Recent Labs    06/24/18 2234  VITAMINB12 300  FOLATE 3.3*  FERRITIN 244  TIBC 198*  IRON 45  RETICCTPCT 0.7   Sepsis Labs: Recent Labs  Lab 06/24/18 2052  LATICACIDVEN 1.0    Recent Results (from the past 240 hour(s))  Urine culture     Status: Abnormal   Collection Time: 06/24/18  6:39 PM   Specimen: Urine, Random  Result Value Ref Range Status   Specimen Description   Final    URINE, RANDOM Performed at [facility] [address], [place], [place] [address]    Special Requests   Final    NONE Performed at [facility], [place] [address] [place], [place] [address]    Culture (A)  Final    <10,000 COLONIES/mL INSIGNIFICANT GROWTH Performed at [facility] [address] [place], [place] [address]    Report Status 06/26/2018 FINAL  Final  Culture, blood (Routine X 2) w Reflex to ID Panel     Status: None (Preliminary result)   Collection Time: 06/24/18  9:23 PM   Specimen: BLOOD LEFT HAND  Result Value Ref Range Status   Specimen Description   Final    BLOOD LEFT HAND Performed at [facility] [address], [place], [place] [address]    Special Requests   Final    BOTTLES DRAWN AEROBIC ONLY Blood Culture adequate volume Performed at [facility] [address], [place], [place] [address]    Culture  Setup Time   Final    AEROBIC BOTTLE ONLY GRAM POSITIVE RODS CRITICAL RESULT CALLED TO, READ BACK BY AND VERIFIED WITH: Shelda Jakes [facility] 06/26/18 1750 JDW Performed at [facility], [place] [address], [place],  [address]    Culture GRAM POSITIVE RODS  Final   Report Status PENDING  Incomplete  SARS Coronavirus 2 (CEPHEID - Performed in [facility] hospital lab), Hosp Order     Status: None   Collection Time: 06/24/18  9:26 PM   Specimen: Nasopharyngeal Swab  Result Value Ref Range Status   SARS  Coronavirus 2 NEGATIVE NEGATIVE Final    Comment: (NOTE) If result is NEGATIVE SARS-CoV-2 target nucleic acids are NOT DETECTED. The  SARS-CoV-2 RNA is generally detectable in upper and lower  respiratory specimens during the acute phase of infection. The lowest  concentration of SARS-CoV-2 viral copies this assay can detect is 250  copies / mL. A negative result does not preclude SARS-CoV-2 infection  and should not be used as the sole basis for treatment or other  patient management decisions.  A negative result may occur with  improper specimen collection / handling, submission of specimen other  than nasopharyngeal swab, presence of viral mutation(s) within the  areas targeted by this assay, and inadequate number of viral copies  (<250 copies / mL). A negative result must be combined with clinical  observations, patient history, and epidemiological information. If result is POSITIVE SARS-CoV-2 target nucleic acids are DETECTED. The SARS-CoV-2 RNA is generally detectable in upper and lower  respiratory specimens dur ing the acute phase of infection.  Positive  results are indicative of active infection with SARS-CoV-2.  Clinical  correlation with patient history and other diagnostic information is  necessary to determine patient infection status.  Positive results do  not rule out bacterial infection or co-infection with other viruses. If result is PRESUMPTIVE POSTIVE SARS-CoV-2 nucleic acids MAY BE PRESENT.   A presumptive positive result was obtained on the submitted specimen  and confirmed on repeat testing.  While 2019 novel coronavirus  (SARS-CoV-2) nucleic acids may be present in the submitted sample  additional confirmatory testing may be necessary for epidemiological  and / or clinical management purposes  to differentiate between  SARS-CoV-2 and other Sarbecovirus currently known to infect humans.  If clinically indicated additional testing with an alternate test   methodology [phone]) is advised. The SARS-CoV-2 RNA is generally  detectable in upper and lower respiratory sp ecimens during the acute  phase of infection. The expected result is Negative. Fact Sheet for Patients:  StrictlyIdeas.no Fact Sheet for Healthcare Providers: BankingDealers.co.za This test is not yet approved or cleared by the [place] FDA and has been authorized for detection and/or diagnosis of SARS-CoV-2 by FDA under an Emergency Use Authorization (EUA).  This EUA will remain in effect (meaning this test can be used) for the duration of the COVID-19 declaration under Section 564(b)(1) of the Act, 21 U.S.C. section 360bbb-3(b)(1), unless the authorization is terminated or revoked sooner. Performed at [facility], [place] [address], [place], [place] [address]   Culture, blood (Routine X 2) w Reflex to ID Panel     Status: None (Preliminary result)   Collection Time: 06/24/18 10:34 PM   Specimen: BLOOD LEFT HAND  Result Value Ref Range Status   Specimen Description   Final    BLOOD LEFT HAND Performed at [facility] [address] [place], [place] [address]    Special Requests   Final    BOTTLES DRAWN AEROBIC AND ANAEROBIC Blood Culture adequate volume Performed at [facility] [address] [place], [place] [address]    Culture   Final    NO GROWTH 2 DAYS Performed at [facility] [address], [place], [place] [address]    Report Status PENDING  Incomplete       Radiology Studies: Nm Hepato W/eject Fract  Result Date: 06/26/2018 CLINICAL DATA:  Kirt Boys is cyst is EXAM: NUCLEAR MEDICINE HEPATOBILIARY IMAGING WITH GALLBLADDER EF TECHNIQUE: Sequential images of the abdomen were obtained out to 60 minutes following intravenous administration of radiopharmaceutical. After oral ingestion of Ensure, gallbladder ejection fraction was determined. At 54  min, normal ejection fraction is greater than 33%. RADIOPHARMACEUTICALS:  5.5 mCi Tc-73m  Choletec IV COMPARISON:  Ultrasound 06/25/2018 FINDINGS: Prompt uptake and biliary excretion of activity by the liver is seen. Gallbladder activity is visualized, consistent with patency of cystic duct. Biliary activity passes into small bowel, consistent with patent common bile duct. Motion patient had difficulty remaining still during the ejection fraction phase. Calculated gallbladder ejection fraction is greater than 50%. (Normal gallbladder ejection fraction with Ensure is greater than 33%.) IMPRESSION: 1. Normal gallbladder filling. Patent cystic duct and common bile duct. 2. Normal ejection fraction. Electronically Signed   By: Suzy Bouchard M.D.   On: 06/26/2018 16:46   Ct Maxillofacial W Contrast  Result Date: 06/25/2018 CLINICAL DATA:  Dental pain. Evaluation for abscess. EXAM: CT MAXILLOFACIAL WITH CONTRAST TECHNIQUE: Multidetector CT imaging of the maxillofacial structures was performed with intravenous contrast. Multiplanar CT image reconstructions were also generated. CONTRAST:  22mL OMNIPAQUE IOHEXOL 300 MG/ML  SOLN COMPARISON:  None. FINDINGS: Osseous: Absent dentition aside from residual mandibular incisor tooth fragments with periapical lucency associated with the right central incisor. No evidence of associated subperiosteal abscess or significant overlying soft tissue swelling. No fracture. Orbits: Bilateral cataract extraction. Sinuses: Small right maxillary sinus mucous retention cyst. Clear mastoid air cells. Soft tissues: Extensive bilateral carotid artery calcific atherosclerosis with potentially severe proximal ICA stenoses bilaterally. Assessment is limited by non angiographic technique and the dense calcification limiting visualization of the residual patent lumen. Limited intracranial: Carotid siphon and vertebral artery atherosclerosis. IMPRESSION: 1. Periapical lucency associated with the  right central incisor tooth fragment. No evidence of subperiosteal abscess. 2. Extensive atherosclerosis with potentially severe bilateral ICA stenoses. Electronically Signed   By: Logan Bores M.D.   On: 06/25/2018 17:05   US Abdomen Limited Ruq  Result Date: 06/25/2018 CLINICAL DATA:  Nausea and vomiting EXAM: ULTRASOUND ABDOMEN LIMITED RIGHT UPPER QUADRANT COMPARISON:  None. FINDINGS: Gallbladder: Cholelithiasis with gallbladder sludge. Gallbladder wall thickening measuring 5.6 mm. No pericholecystic fluid. Negative sonographic Murphy sign. Common bile duct: Diameter: 8.1 mm.  No choledocholithiasis. Liver: No focal lesion identified. Within normal limits in parenchymal echogenicity. Portal vein is patent on color Doppler imaging with normal direction of blood flow towards the liver. IMPRESSION: Cholelithiasis with gallbladder wall thickening concerning for acute cholecystitis. Electronically Signed   By: Kathreen Devoid   On: 06/25/2018 17:17      Scheduled Meds: . aspirin  325 mg Oral Daily  . atorvastatin  80 mg Oral q1800  . enoxaparin (LOVENOX) injection  30 mg Subcutaneous QHS  . feeding supplement (ENSURE ENLIVE)  237 mL Oral BID BM  . folic acid  1 mg Oral Daily  . insulin aspart  0-9 Units Subcutaneous Q4H  . mirtazapine  15 mg Oral QHS  . senna  1 tablet Oral BID  . sodium chloride flush  3 mL Intravenous Q12H  . sodium chloride flush  3 mL Intravenous Q12H   Continuous Infusions: . sodium chloride 10 mL/hr at 06/25/18 0500  . dextrose 5 % and 0.45% NaCl Stopped (06/27/18 6283)     LOS: 2 days    Time spent: 25 minutes   Dessa Phi, DO [facility] [email or web address] 06/27/2018, 9:31 AM

## 2018-06-27 NOTE — Progress Notes (Signed)
Bilateral carotid duplex completed. Preliminary results in Chart review CV Proc. [place] Staley Lunz,RVS  06/27/2018, 10:30 AM

## 2018-06-27 NOTE — Progress Notes (Signed)
Hypotension due to hypovolemia  Subjective: No new complaints  Objective: Vital signs in last 24 hours: Temp:  [97.9 F (36.6 C)-98.2 F (36.8 C)] 98.2 F (36.8 C) (06/20 0428) Pulse Rate:  [63-84] 63 (06/20 0428) Resp:  [16] 16 (06/20 0428) BP: (92-114)/(43-55) 96/43 (06/20 0428) SpO2:  [100 %] 100 % (06/19 2009) Weight:  [51.5 kg] 51.5 kg (06/20 0500) Last BM Date: (PT DIDNT REMEMBER)  Intake/Output from previous day: 06/19 0701 - 06/20 0700 In: 1237.5 [I.V.:1092.1; IV Piggyback:145.4] Out: -  Intake/Output this shift: No intake/output data recorded.  General appearance: cooperative GI: normal findings: soft, non-tender  Lab Results:  Results for orders placed or performed during the hospital encounter of 06/24/18 (from the past 24 hour(s))  Glucose, capillary     Status: None   Collection Time: 06/26/18 11:46 AM  Result Value Ref Range   Glucose-Capillary 74 70 - 99 mg/dL  Glucose, capillary     Status: Abnormal   Collection Time: 06/26/18  4:22 PM  Result Value Ref Range   Glucose-Capillary 158 (H) 70 - 99 mg/dL  Glucose, capillary     Status: Abnormal   Collection Time: 06/26/18  8:11 PM  Result Value Ref Range   Glucose-Capillary 198 (H) 70 - 99 mg/dL  Glucose, capillary     Status: Abnormal   Collection Time: 06/27/18 12:44 AM  Result Value Ref Range   Glucose-Capillary 101 (H) 70 - 99 mg/dL  Glucose, capillary     Status: Abnormal   Collection Time: 06/27/18  4:26 AM  Result Value Ref Range   Glucose-Capillary 110 (H) 70 - 99 mg/dL  CBC     Status: Abnormal   Collection Time: 06/27/18  5:04 AM  Result Value Ref Range   WBC 4.4 4.0 - 10.5 K/uL   RBC 2.94 (L) 3.87 - 5.11 MIL/uL   Hemoglobin 8.9 (L) 12.0 - 15.0 g/dL   HCT 27.3 (L) 36.0 - 46.0 %   MCV 92.9 80.0 - 100.0 fL   MCH 30.3 26.0 - 34.0 pg   MCHC 32.6 30.0 - 36.0 g/dL   RDW 14.1 11.5 - 15.5 %   Platelets 151 150 - 400 K/uL   nRBC 0.0 0.0 - 0.2 %  Basic metabolic panel     Status: Abnormal   Collection Time: 06/27/18  5:04 AM  Result Value Ref Range   Sodium 138 135 - 145 mmol/L   Potassium 3.2 (L) 3.5 - 5.1 mmol/L   Chloride 105 98 - 111 mmol/L   CO2 23 22 - 32 mmol/L   Glucose, Bld 127 (H) 70 - 99 mg/dL   BUN 8 8 - 23 mg/dL   Creatinine, Ser 0.60 0.44 - 1.00 mg/dL   Calcium 8.3 (L) 8.9 - 10.3 mg/dL   GFR calc non Af Amer >60 >60 mL/min   GFR calc Af Amer >60 >60 mL/min   Anion gap 10 5 - 15  Hepatic function panel     Status: Abnormal   Collection Time: 06/27/18  5:04 AM  Result Value Ref Range   Total Protein 5.9 (L) 6.5 - 8.1 g/dL   Albumin 3.2 (L) 3.5 - 5.0 g/dL   AST 12 (L) 15 - 41 U/L   ALT 9 0 - 44 U/L   Alkaline Phosphatase 30 (L) 38 - 126 U/L   Total Bilirubin 0.7 0.3 - 1.2 mg/dL   Bilirubin, Direct 0.2 0.0 - 0.2 mg/dL   Indirect Bilirubin 0.5 0.3 - 0.9 mg/dL  Glucose,  capillary     Status: None   Collection Time: 06/27/18  7:48 AM  Result Value Ref Range   Glucose-Capillary 98 70 - 99 mg/dL     Studies/Results Radiology     MEDS, Scheduled . aspirin  325 mg Oral Daily  . atorvastatin  80 mg Oral q1800  . enoxaparin (LOVENOX) injection  30 mg Subcutaneous QHS  . feeding supplement (ENSURE ENLIVE)  237 mL Oral BID BM  . folic acid  1 mg Oral Daily  . insulin aspart  0-9 Units Subcutaneous Q4H  . mirtazapine  15 mg Oral QHS  . potassium chloride  40 mEq Oral Once  . senna  1 tablet Oral BID  . sodium chloride flush  3 mL Intravenous Q12H  . sodium chloride flush  3 mL Intravenous Q12H     Assessment: Hypotension due to hypovolemia HIDA neg for cholecystitis  Plan:   I do not feel that the patient's symptoms are due to gallbladder disease.  HIDA is normal.  Will defer to medicine and GI for further evaluation and management.  Will sign off.  Please call us with any questions or concerns   LOS: 2 days    Rosario Adie, MD [facility], [place]   06/27/2018 9:23 AM

## 2018-06-28 LAB — BASIC METABOLIC PANEL
Anion gap: 8 (ref 5–15)
BUN: <5 mg/dL — ABNORMAL LOW (ref 8–23)
CO2: 25 mmol/L (ref 22–32)
Calcium: 8 mg/dL — ABNORMAL LOW (ref 8.9–10.3)
Chloride: 105 mmol/L (ref 98–111)
Creatinine, Ser: 0.62 mg/dL (ref 0.44–1.00)
GFR calc Af Amer: >60 mL/min (ref >60)
GFR calc non Af Amer: >60 mL/min (ref >60)
Glucose, Bld: 150 mg/dL — ABNORMAL HIGH (ref 70–99)
Potassium: 2.9 mmol/L — ABNORMAL LOW (ref 3.5–5.1)
Sodium: 138 mmol/L (ref 135–145)

## 2018-06-28 LAB — CBC
HCT: 25.9 % — ABNORMAL LOW (ref 36.0–46.0)
Hemoglobin: 8.5 g/dL — ABNORMAL LOW (ref 12.0–15.0)
MCH: 30.6 pg (ref 26.0–34.0)
MCHC: 32.8 g/dL (ref 30.0–36.0)
MCV: 93.2 fL (ref 80.0–100.0)
Platelets: 150 10*3/uL (ref 150–400)
RBC: 2.78 MIL/uL — ABNORMAL LOW (ref 3.87–5.11)
RDW: 14.1 % (ref 11.5–15.5)
WBC: 3.5 10*3/uL — ABNORMAL LOW (ref 4.0–10.5)
nRBC: 0 % (ref 0.0–0.2)

## 2018-06-28 LAB — GLUCOSE, CAPILLARY
Glucose-Capillary: 103 mg/dL — ABNORMAL HIGH (ref 70–99)
Glucose-Capillary: 117 mg/dL — ABNORMAL HIGH (ref 70–99)
Glucose-Capillary: 127 mg/dL — ABNORMAL HIGH (ref 70–99)
Glucose-Capillary: 133 mg/dL — ABNORMAL HIGH (ref 70–99)
Glucose-Capillary: 162 mg/dL — ABNORMAL HIGH (ref 70–99)
Glucose-Capillary: 210 mg/dL — ABNORMAL HIGH (ref 70–99)
Glucose-Capillary: 68 mg/dL — ABNORMAL LOW (ref 70–99)

## 2018-06-28 LAB — MAGNESIUM: Magnesium: 1.4 mg/dL — ABNORMAL LOW (ref 1.7–2.4)

## 2018-06-28 MED ORDER — GLYCERIN (LAXATIVE) 2.1 G RE SUPP
1.0000 | Freq: Once | RECTAL | Status: DC
  Filled 2018-06-28: qty 1

## 2018-06-28 MED ORDER — POLYETHYLENE GLYCOL 3350 17 G PO PACK
17.0000 g | PACK | Freq: Every day | ORAL | Status: DC
  Administered 2018-06-28 – 2018-06-30 (×3): 17 g via ORAL
  Filled 2018-06-28 (×3): qty 1

## 2018-06-28 MED ORDER — POTASSIUM CHLORIDE CRYS ER 20 MEQ PO TBCR
40.0000 meq | EXTENDED_RELEASE_TABLET | ORAL | Status: AC
  Administered 2018-06-28 (×2): 40 meq via ORAL
  Filled 2018-06-28 (×2): qty 2

## 2018-06-28 MED ORDER — MAGNESIUM SULFATE 2 GM/50ML IV SOLN
2.0000 g | Freq: Once | INTRAVENOUS | Status: AC
  Administered 2018-06-28: 2 g via INTRAVENOUS
  Filled 2018-06-28: qty 50

## 2018-06-28 NOTE — Progress Notes (Signed)
PROGRESS NOTE    [name] [name]  TIR:[id number] DOB: 10-31-1931 DOA: 06/24/2018 PCP: [name], MD     Brief Narrative:  [name] [name] is a 85 y.o. female with medical history significant for dementia, coronary artery disease, ischemic cardiomyopathy, hypothyroidism, and type 2 diabetes mellitus, now presenting to emergency department for evaluation of nausea, vomiting, and low blood pressure.  Patient has reportedly been experiencing nausea with nonbloody vomiting for the past 3 to 4 weeks, has been able to tolerate cola, but has vomited anytime she attempts to drink water or eat.  She has not been taking her medications "for a long time" per report of her son.  She was taken to see her PCP for evaluation of these complaints, was noted to have a blood pressure of 84/52, and was directed to the ED for further evaluation.  She was given 250 cc of IV fluids with EMS prior to arrival in the ED. patient denies abdominal pain, diarrhea, fevers, or chills.  She denies any dysphasia. KUB features a nonobstructive bowel gas pattern with large amount of stool in the colon. Patient underwent RUQ [place] which was concerning for cholelithiasis with cholecystitis.  General surgery as well as cardiology were consulted for cardiac clearance. Patient underwent HIDA scan which was normal.  General surgery does not feel patient's gallbladder is her primary issue for symptoms.  Possibly gastroparesis.  New events last 24 hours / Subjective: No complaints today, denies any pain. No further nausea or vomiting. No recorded BM since admission.   Assessment & Plan:   Principal Problem:   Hypotension due to hypovolemia Active Problems:   Essential hypertension   Hypothyroidism   Coronary artery disease   Mild dementia (HCC)   Type 2 diabetes mellitus (HCC)   Nausea & vomiting   Leukopenia   History of ischemic cardiomyopathy   Hyponatremia   Intractable nausea and vomiting -KUB is negative for obstruction  but notable for heavy colonic stool burden  -RUQ US revealed cholelithiasis with gallbladder wall thickening concerning for acute cholecystitis -HIDA scan with normal gallbladder filling, normal EF.  Stop Rocephin, Flagyl -General surgery consulted and signed off 6/20 -Possibly secondary to gastroparesis from longstanding diabetes, would recommend gastric emptying study as an outpatient.  Start empiric Reglan.  Advance diet to regular diet today  Hypotension  -Patient presents from PCP office where she was seen for 3-4 wks of N/V and found to have SBP in low 80's.  This is likely secondary to hypovolemia in setting of persistent nausea and vomiting.  Blood pressure has improved after IV fluids -Hold lisinopril, verapamil, imdur until BP further stabilized  -BP this morning low but stable 95/49 -Continue IV fluids until p.o. intake adequate  Type II DM  -Hemoglobin A1c 6.1  -Hold metformin  -Sliding scale insulin  CAD; history of ischemic cardiomyopathy, s/p CABG  -Stable. Continue aspirin and Lipitor -Appreciate cardiology.  Echocardiogram with EF 60 to 65%, moderate thickening of aortic valve  Hyponatremia  -Resolved with IV fluids  History of hypothyroidism -Free T4 1.25, TSH 0.288. Patient does not remember if she is on thyroid medications. Per chart review, it looks like it was discontinued back in 2015. Patient also unsure if she takes biotin supplements. Would repeat labs as outpatient in several weeks.   Dementia -Follows Neurology Dr. Delice Lesch. Slowly progressed over the past year    Dental pain -CT maxillofacial reveals periapical lucency right central incisor, no subperiosteal abscess. Follow up with primary dentist outpatient  Pyuria -Initially concerned about UTI, UA with moderate leukocytes and rare bacteria, 6-10 WBC. No complaints of dysuria. Stop rocephin.   Hypokalemia -Replace, trend  Hypomagnesemia -Replace, trend    DVT prophylaxis: Lovenox Code  Status: Full Family Communication: Called son with update Disposition Plan: Pending improvement of oral intake. PT OT recommending home health on discharge.   Consultants:   General surgery  Cardiology  Procedures:   None   Antimicrobials:  Anti-infectives (From admission, onward)   Start     Dose/Rate Route Frequency Ordered Stop   06/27/18 0600  metroNIDAZOLE (FLAGYL) IVPB 500 mg  Status:  Discontinued     500 mg 100 mL/hr over 60 Minutes Intravenous Every 8 hours 06/26/18 1825 06/27/18 0910   06/26/18 1845  metroNIDAZOLE (FLAGYL) IVPB 500 mg  Status:  Discontinued     500 mg 100 mL/hr over 60 Minutes Intravenous  Once 06/26/18 1825 06/27/18 0910   06/26/18 1200  cefTRIAXone (ROCEPHIN) 2 g in sodium chloride 0.9 % 100 mL IVPB  Status:  Discontinued     2 g 200 mL/hr over 30 Minutes Intravenous Every 24 hours 06/26/18 1025 06/27/18 0910   06/25/18 2000  cefTRIAXone (ROCEPHIN) 1 g in sodium chloride 0.9 % 100 mL IVPB  Status:  Discontinued     1 g 200 mL/hr over 30 Minutes Intravenous Every 24 hours 06/24/18 2154 06/25/18 1253   06/24/18 1945  cefTRIAXone (ROCEPHIN) 1 g in sodium chloride 0.9 % 100 mL IVPB     1 g 200 mL/hr over 30 Minutes Intravenous  Once 06/24/18 1944 06/24/18 2045       Objective: Vitals:   06/27/18 0500 06/27/18 1524 06/27/18 2100 06/28/18 0408  BP:  (!) 94/49 102/81 (!) 95/49  Pulse:  60 66 64  Resp:   16 16  Temp:  98.4 F (36.9 C) 98.1 F (36.7 C) 98.7 F (37.1 C)  TempSrc:  Oral Oral Oral  SpO2:  97% 99% 96%  Weight: 51.5 kg   52.7 kg   No intake or output data in the 24 hours ending 06/28/18 1323 Filed Weights   06/26/18 0658 06/27/18 0500 06/28/18 0408  Weight: 47.2 kg 51.5 kg 52.7 kg    Examination: General exam: Appears calm and comfortable  Respiratory system: Clear to auscultation. Respiratory effort normal. Cardiovascular system: S1 & S2 heard, RRR. No JVD, murmurs, rubs, gallops or clicks. No pedal edema. Gastrointestinal  system: Abdomen is nondistended, soft and nontender. No organomegaly or masses felt. Normal bowel sounds heard. Central nervous system: Alert  Extremities: Symmetric  Skin: No rashes, lesions or ulcers Psychiatry: Judgement and insight appear stable   Data Reviewed: I have personally reviewed following labs and imaging studies  CBC: Recent Labs  Lab 06/24/18 1739 06/25/18 0526 06/26/18 0520 06/27/18 0504 06/28/18 0552  WBC 3.6* 3.3* 4.0 4.4 3.5*  NEUTROABS 1.6* 1.3*  --   --   --   HGB 11.0* 9.3* 9.2* 8.9* 8.5*  HCT 32.6* 27.9* 27.6* 27.3* 25.9*  MCV 90.8 92.1 92.3 92.9 93.2  PLT 182 159 159 151 998   Basic Metabolic Panel: Recent Labs  Lab 06/24/18 1739 06/25/18 0526 06/26/18 0520 06/27/18 0504 06/28/18 0552  NA 131* 135 136 138 138  K 3.7 3.6 3.6 3.2* 2.9*  CL 98 102 103 105 105  CO2 22 23 21* 23 25  GLUCOSE 136* 103* 73 127* 150*  BUN 13 11 8 8  <5*  CREATININE 0.83 0.76 0.72 0.60 0.62  CALCIUM 8.4*  8.1* 8.4* 8.3* 8.0*  MG  --   --   --   --  1.4*   GFR: Estimated Creatinine Clearance: 39.5 mL/min (by C-G formula based on SCr of 0.62 mg/dL). Liver Function Tests: Recent Labs  Lab 06/24/18 1739 06/25/18 0526 06/26/18 0520 06/27/18 0504  AST 16 13* 14* 12*  ALT 9 9 9 9   ALKPHOS 33* 31* 31* 30*  BILITOT 1.0 1.0 1.3* 0.7  PROT 7.1 6.4* 6.2* 5.9*  ALBUMIN 3.9 3.5 3.4* 3.2*   Recent Labs  Lab 06/24/18 1739  LIPASE 70*   No results for input(s): AMMONIA in the last 168 hours. Coagulation Profile: No results for input(s): INR, PROTIME in the last 168 hours. Cardiac Enzymes: No results for input(s): CKTOTAL, CKMB, CKMBINDEX, TROPONINI in the last 168 hours. BNP (last 3 results) No results for input(s): PROBNP in the last 8760 hours. HbA1C: No results for input(s): HGBA1C in the last 72 hours. CBG: Recent Labs  Lab 06/27/18 2058 06/28/18 0005 06/28/18 0405 06/28/18 0746 06/28/18 1210  GLUCAP 108* 127* 133* 162* 117*   Lipid Profile: No results  for input(s): CHOL, HDL, LDLCALC, TRIG, CHOLHDL, LDLDIRECT in the last 72 hours. Thyroid Function Tests: No results for input(s): TSH, T4TOTAL, FREET4, T3FREE, THYROIDAB in the last 72 hours. Anemia Panel: No results for input(s): VITAMINB12, FOLATE, FERRITIN, TIBC, IRON, RETICCTPCT in the last 72 hours. Sepsis Labs: Recent Labs  Lab 06/24/18 2052  LATICACIDVEN 1.0    Recent Results (from the past 240 hour(s))  Urine culture     Status: Abnormal   Collection Time: 06/24/18  6:39 PM   Specimen: Urine, Random  Result Value Ref Range Status   Specimen Description   Final    URINE, RANDOM Performed at [facility] [address], [place], [place] [address]    Special Requests   Final    NONE Performed at [facility], [place] [address], [place], [place] [address]    Culture (A)  Final    <10,000 COLONIES/mL INSIGNIFICANT GROWTH Performed at [facility] [address], [place], [place] [address]    Report Status 06/26/2018 FINAL  Final  Culture, blood (Routine X 2) w Reflex to ID Panel     Status: Abnormal   Collection Time: 06/24/18  9:23 PM   Specimen: BLOOD LEFT HAND  Result Value Ref Range Status   Specimen Description   Final    BLOOD LEFT HAND Performed at [facility] [address] [place], [place] [address]    Special Requests   Final    BOTTLES DRAWN AEROBIC ONLY Blood Culture adequate volume Performed at [facility] [address], [place], [place] [address]    Culture  Setup Time   Final    AEROBIC BOTTLE ONLY GRAM POSITIVE RODS CRITICAL RESULT CALLED TO, READ BACK BY AND VERIFIED WITH: Shelda Jakes [facility] 06/26/18 1750 JDW    Culture (A)  Final    DIPHTHEROIDS(CORYNEBACTERIUM SPECIES) Standardized susceptibility testing for this organism is not available. Performed at [facility], [place] [address] [place],  [address]    Report Status 06/27/2018 FINAL  Final  SARS  Coronavirus 2 (CEPHEID - Performed in [facility] hospital lab), Hosp Order     Status: None   Collection Time: 06/24/18  9:26 PM   Specimen: Nasopharyngeal Swab  Result Value Ref Range Status   SARS Coronavirus 2 NEGATIVE NEGATIVE Final    Comment: (NOTE)  If result is NEGATIVE SARS-CoV-2 target nucleic acids are NOT DETECTED. The SARS-CoV-2 RNA is generally detectable in upper and lower  respiratory specimens during the acute phase of infection. The lowest  concentration of SARS-CoV-2 viral copies this assay can detect is 250  copies / mL. A negative result does not preclude SARS-CoV-2 infection  and should not be used as the sole basis for treatment or other  patient management decisions.  A negative result may occur with  improper specimen collection / handling, submission of specimen other  than nasopharyngeal swab, presence of viral mutation(s) within the  areas targeted by this assay, and inadequate number of viral copies  (<250 copies / mL). A negative result must be combined with clinical  observations, patient history, and epidemiological information. If result is POSITIVE SARS-CoV-2 target nucleic acids are DETECTED. The SARS-CoV-2 RNA is generally detectable in upper and lower  respiratory specimens dur ing the acute phase of infection.  Positive  results are indicative of active infection with SARS-CoV-2.  Clinical  correlation with patient history and other diagnostic information is  necessary to determine patient infection status.  Positive results do  not rule out bacterial infection or co-infection with other viruses. If result is PRESUMPTIVE POSTIVE SARS-CoV-2 nucleic acids MAY BE PRESENT.   A presumptive positive result was obtained on the submitted specimen  and confirmed on repeat testing.  While 2019 novel coronavirus  (SARS-CoV-2) nucleic acids may be present in the submitted sample  additional confirmatory testing may be necessary for epidemiological  and / or  clinical management purposes  to differentiate between  SARS-CoV-2 and other Sarbecovirus currently known to infect humans.  If clinically indicated additional testing with an alternate test  methodology [phone]) is advised. The SARS-CoV-2 RNA is generally  detectable in upper and lower respiratory sp ecimens during the acute  phase of infection. The expected result is Negative. Fact Sheet for Patients:  StrictlyIdeas.no Fact Sheet for Healthcare Providers: BankingDealers.co.za This test is not yet approved or cleared by the [place] FDA and has been authorized for detection and/or diagnosis of SARS-CoV-2 by FDA under an Emergency Use Authorization (EUA).  This EUA will remain in effect (meaning this test can be used) for the duration of the COVID-19 declaration under Section 564(b)(1) of the Act, 21 U.S.C. section 360bbb-3(b)(1), unless the authorization is terminated or revoked sooner. Performed at [facility], [place] [address], [place], [place] [address]   Culture, blood (Routine X 2) w Reflex to ID Panel     Status: None (Preliminary result)   Collection Time: 06/24/18 10:34 PM   Specimen: BLOOD LEFT HAND  Result Value Ref Range Status   Specimen Description   Final    BLOOD LEFT HAND Performed at [facility] [address] [place], [place] [address]    Special Requests   Final    BOTTLES DRAWN AEROBIC AND ANAEROBIC Blood Culture adequate volume Performed at [facility] [address] [place], [place] [address]    Culture   Final    NO GROWTH 3 DAYS Performed at [facility], [place] [address] [place], [place] [address]    Report Status PENDING  Incomplete       Radiology Studies: Nm Hepato W/eject Fract  Result Date: 06/26/2018 CLINICAL DATA:  Kirt Boys is cyst is EXAM: NUCLEAR MEDICINE HEPATOBILIARY IMAGING WITH GALLBLADDER EF TECHNIQUE: Sequential  images of the abdomen were obtained out to 60 minutes following intravenous administration of  radiopharmaceutical. After oral ingestion of Ensure, gallbladder ejection fraction was determined. At 60 min, normal ejection fraction is greater than 33%. RADIOPHARMACEUTICALS:  5.5 mCi Tc-21m  Choletec IV COMPARISON:  Ultrasound 06/25/2018 FINDINGS: Prompt uptake and biliary excretion of activity by the liver is seen. Gallbladder activity is visualized, consistent with patency of cystic duct. Biliary activity passes into small bowel, consistent with patent common bile duct. Motion patient had difficulty remaining still during the ejection fraction phase. Calculated gallbladder ejection fraction is greater than 50%. (Normal gallbladder ejection fraction with Ensure is greater than 33%.) IMPRESSION: 1. Normal gallbladder filling. Patent cystic duct and common bile duct. 2. Normal ejection fraction. Electronically Signed   By: Suzy Bouchard M.D.   On: 06/26/2018 16:46   Vas US Carotid  Result Date: 06/27/2018 Carotid Arterial Duplex Study Indications:      Bruit, Carotid artery disease, pre op for cholecystectomy and                   Previous carotid stenosis noted during pre CABG evaluation. Risk Factors:     Hypertension, hyperlipidemia, Diabetes, coronary artery                   disease. Other Factors:    Previous CABG 2017, CKD. Limitations:      Tortuosity Comparison Study: Available 11/08/ 2017 Performing Technologist: Toma Copier RVS  Examination Guidelines: A complete evaluation includes B-mode imaging, spectral Doppler, color Doppler, and power Doppler as needed of all accessible portions of each vessel. Bilateral testing is considered an integral part of a complete examination. Limited examinations for reoccurring indications may be performed as noted.  Right Carotid Findings: +----------+--------+--------+--------+---------------------+------------------+           PSV cm/sEDV  cm/sStenosisDescribe             Comments           +----------+--------+--------+--------+---------------------+------------------+ CCA Prox  67      8                                    mild intimal wall                                                         changes            +----------+--------+--------+--------+---------------------+------------------+ CCA Distal                        heterogenous         mild plaque        +----------+--------+--------+--------+---------------------+------------------+ ICA Prox  270     47      40-59%  heterogenous and     narrowing                                            irregular                               +----------+--------+--------+--------+---------------------+------------------+ ICA Mid   240     53  40-59%  heterogenous and     tortuous                                             irregular                               +----------+--------+--------+--------+---------------------+------------------+ ICA Distal92      17                                   tortuous           +----------+--------+--------+--------+---------------------+------------------+ ECA       191     8               heterogenous and     with acoustic                                        irregular            shadowing          +----------+--------+--------+--------+---------------------+------------------+ +----------+--------+-------+--------+-------------------+           PSV cm/sEDV cmsDescribeArm Pressure (mmHG) +----------+--------+-------+--------+-------------------+ Subclavian109                                        +----------+--------+-------+--------+-------------------+ +---------+--------+--+--------+--+---------+ VertebralPSV cm/s85EDV cm/s12Antegrade +---------+--------+--+--------+--+---------+ Stenosis appears to be in the higher end of 40% to 59% range or may be a lower end  of 60% to 79% range. This shows no significant change from the exam of 2017  Left Carotid Findings: +----------+--------+--------+--------+---------------------+------------------+           PSV cm/sEDV cm/sStenosisDescribe             Comments           +----------+--------+--------+--------+---------------------+------------------+ CCA Prox  71      3               heterogenous         mild intimal wall                                                         changes with mild                                                         plaque on the near                                                        wall               +----------+--------+--------+--------+---------------------+------------------+  CCA Distal86                      heterogenous         mild plaque        +----------+--------+--------+--------+---------------------+------------------+ ICA Prox  85      23      1-39%   heterogenous,        moderate plaque                                      irregular and        noted with                                           calcific             narrowing.                                                                difficult to image +----------+--------+--------+--------+---------------------+------------------+ ICA Mid   61      14                                   tortuous           +----------+--------+--------+--------+---------------------+------------------+ ICA Distal65      17                                   tortuous           +----------+--------+--------+--------+---------------------+------------------+ ECA       102     5               heterogenous and     mild to moderate                                     irregular            plaque with                                                               acoustic shadowing  +----------+--------+--------+--------+---------------------+------------------+ +----------+--------+--------+--------+-------------------+ SubclavianPSV cm/sEDV cm/sDescribeArm Pressure (mmHG) +----------+--------+--------+--------+-------------------+           107                                         +----------+--------+--------+--------+-------------------+ +---------+--------+--+--------+--+ VertebralPSV cm/s71EDV cm/s17 +---------+--------+--+--------+--+ Difficult to image the ICA due to tortuosity and a dilated ECA, There appears to be no significant change from the exam of 2017  Summary: Right Carotid: Velocities in the right  ICA are consistent with a 40-59%                stenosis. See technical comments lised above. Left Carotid: Velocities in the left ICA are consistent with a 1-39% stenosis.               See technical comments listed above. Vertebrals:  Bilateral vertebral arteries demonstrate antegrade flow. Subclavians: Normal flow hemodynamics were seen in bilateral subclavian              arteries. *See table(s) above for measurements and observations.  Electronically signed by Monica Martinez MD on 06/27/2018 at 1:33:48 PM.    Final       Scheduled Meds: . aspirin  325 mg Oral Daily  . atorvastatin  80 mg Oral q1800  . enoxaparin (LOVENOX) injection  30 mg Subcutaneous QHS  . feeding supplement (ENSURE ENLIVE)  237 mL Oral BID BM  . folic acid  1 mg Oral Daily  . insulin aspart  0-9 Units Subcutaneous Q4H  . metoCLOPramide (REGLAN) injection  5 mg Intravenous Q8H  . mirtazapine  15 mg Oral QHS  . potassium chloride  40 mEq Oral Q4H  . senna  1 tablet Oral BID  . sodium chloride flush  3 mL Intravenous Q12H  . sodium chloride flush  3 mL Intravenous Q12H   Continuous Infusions: . sodium chloride 10 mL/hr at 06/25/18 0500  . dextrose 5 % and 0.45% NaCl 75 mL/hr at 06/28/18 1040     LOS: 3 days    Time spent: 25 minutes   Dessa Phi, DO [facility] [email or web address] 06/28/2018, 1:23 PM

## 2018-06-28 NOTE — Progress Notes (Signed)
Pt's CBG this evening at 2000 is 68. In report, day shift RN reported decreased PO intake today. RN provided apple juice for the pt and will recheck CBG in 30 min. Asymptomatic. Will continue to monitor.

## 2018-06-28 NOTE — Progress Notes (Signed)
Occupational Therapy Treatment Patient Details Name: [name] [name] MRN: [id number] DOB: [birth year] Today's Date: 06/28/2018    History of present illness [name] [name] is a 85 y.o. female with medical history significant for dementia, coronary artery disease, ischemic cardiomyopathy, hypothyroidism, and type 2 diabetes mellitus, now presenting to emergency department for evaluation of nausea, vomiting, and low blood pressure.   OT comments  BP has been low today; no c/o dizziness during adl.  Transferred to chair for lunch with chair alarm on:  NT and RN notified  Follow Up Recommendations  Supervision/Assistance - 24 hour;Home health OT    Equipment Recommendations  3 in 1 bedside commode    Recommendations for Other Services      Precautions / Restrictions Precautions Precautions: Fall;Other (comment) Precaution Comments: monitor BP Restrictions Weight Bearing Restrictions: No       Mobility Bed Mobility               General bed mobility comments: supervision for OOB, HOB raised and use of rail  Transfers   Equipment used: None   Sit to Stand: Min guard         General transfer comment: for safety    Balance             Standing balance-Leahy Scale: Fair                             ADL either performed or assessed with clinical judgement   ADL   Eating/Feeding: Set up   Grooming: Wash/dry hands;Wash/dry face;Set up;Sitting   Upper Body Bathing: Set up;Sitting   Lower Body Bathing: Min guard;Sit to/from stand   Upper Body Dressing : Set up(Min A to tie gown)       Toilet Transfer: Min guard;Stand-pivot;RW(to chair)             General ADL Comments: Pt washed from eob, donned new gown and performed SPT to chair for lunch.  Assist needed to open containers     Vision       Perception     Praxis      Cognition Arousal/Alertness: Awake/alert Behavior During Therapy: Flat affect Overall Cognitive Status: No  family/caregiver present to determine baseline cognitive functioning                                 General Comments: h/o dementia        Exercises     Shoulder Instructions       General Comments BP has been running low. Did not check during session:  no c/o dizziness    Pertinent Vitals/ Pain       Pain Assessment: No/denies pain  Home Living                                          Prior Functioning/Environment              Frequency  Min 2X/week        Progress Toward Goals  OT Goals(current goals can now be found in the care plan section)  Progress towards OT goals: Progressing toward goals  Acute Rehab OT Goals Time For Goal Achievement: 07/10/18  Plan      Co-evaluation  AM-PAC OT "6 Clicks" Daily Activity     Outcome Measure   Help from another person eating meals?: A Little Help from another person taking care of personal grooming?: A Little Help from another person toileting, which includes using toliet, bedpan, or urinal?: A Little Help from another person bathing (including washing, rinsing, drying)?: A Little Help from another person to put on and taking off regular upper body clothing?: A Little Help from another person to put on and taking off regular lower body clothing?: A Little 6 Click Score: 18    End of Session    OT Visit Diagnosis: Muscle weakness (generalized) (M62.81)   Activity Tolerance Patient tolerated treatment well   Patient Left in chair;with call bell/phone within reach;with chair alarm set   Nurse Communication          Time: 3837-7939 OT Time Calculation (min): 17 min  Charges: OT General Charges $OT Visit: 1 Visit OT Treatments $Self Care/Home Management : 8-22 mins  Lesle Chris, OTR/L Acute Rehabilitation Services [phone] [facility] pager [phone] office 06/28/2018   [place] 06/28/2018, 1:12 PM

## 2018-06-29 LAB — CBC
HCT: 27.2 % — ABNORMAL LOW (ref 36.0–46.0)
Hemoglobin: 8.6 g/dL — ABNORMAL LOW (ref 12.0–15.0)
MCH: 30 pg (ref 26.0–34.0)
MCHC: 31.6 g/dL (ref 30.0–36.0)
MCV: 94.8 fL (ref 80.0–100.0)
Platelets: 156 10*3/uL (ref 150–400)
RBC: 2.87 MIL/uL — ABNORMAL LOW (ref 3.87–5.11)
RDW: 14.5 % (ref 11.5–15.5)
WBC: 5.8 10*3/uL (ref 4.0–10.5)
nRBC: 0 % (ref 0.0–0.2)

## 2018-06-29 LAB — CULTURE, BLOOD (ROUTINE X 2)
Culture: NO GROWTH
Special Requests: ADEQUATE

## 2018-06-29 LAB — GLUCOSE, CAPILLARY
Glucose-Capillary: 113 mg/dL — ABNORMAL HIGH (ref 70–99)
Glucose-Capillary: 116 mg/dL — ABNORMAL HIGH (ref 70–99)
Glucose-Capillary: 118 mg/dL — ABNORMAL HIGH (ref 70–99)
Glucose-Capillary: 120 mg/dL — ABNORMAL HIGH (ref 70–99)
Glucose-Capillary: 165 mg/dL — ABNORMAL HIGH (ref 70–99)
Glucose-Capillary: 178 mg/dL — ABNORMAL HIGH (ref 70–99)
Glucose-Capillary: 57 mg/dL — ABNORMAL LOW (ref 70–99)

## 2018-06-29 LAB — BASIC METABOLIC PANEL
Anion gap: 7 (ref 5–15)
BUN: <5 mg/dL — ABNORMAL LOW (ref 8–23)
CO2: 21 mmol/L — ABNORMAL LOW (ref 22–32)
Calcium: 8 mg/dL — ABNORMAL LOW (ref 8.9–10.3)
Chloride: 109 mmol/L (ref 98–111)
Creatinine, Ser: 0.58 mg/dL (ref 0.44–1.00)
GFR calc Af Amer: >60 mL/min (ref >60)
GFR calc non Af Amer: >60 mL/min (ref >60)
Glucose, Bld: 132 mg/dL — ABNORMAL HIGH (ref 70–99)
Potassium: 3.9 mmol/L (ref 3.5–5.1)
Sodium: 137 mmol/L (ref 135–145)

## 2018-06-29 LAB — MAGNESIUM: Magnesium: 1.6 mg/dL — ABNORMAL LOW (ref 1.7–2.4)

## 2018-06-29 MED ORDER — ADULT MULTIVITAMIN W/MINERALS CH
1.0000 | ORAL_TABLET | Freq: Every day | ORAL | Status: DC
  Administered 2018-06-29 – 2018-07-01 (×3): 1 via ORAL
  Filled 2018-06-29 (×3): qty 1

## 2018-06-29 MED ORDER — MAGNESIUM SULFATE 2 GM/50ML IV SOLN
2.0000 g | Freq: Once | INTRAVENOUS | Status: AC
  Administered 2018-06-29: 2 g via INTRAVENOUS
  Filled 2018-06-29: qty 50

## 2018-06-29 MED ORDER — GLYCERIN (LAXATIVE) 2.1 G RE SUPP
1.0000 | Freq: Once | RECTAL | Status: AC
  Administered 2018-06-29: 1 via RECTAL
  Filled 2018-06-29: qty 1

## 2018-06-29 MED ORDER — BOOST / RESOURCE BREEZE PO LIQD CUSTOM
1.0000 | Freq: Two times a day (BID) | ORAL | Status: DC
  Administered 2018-06-29 – 2018-07-01 (×4): 1 via ORAL

## 2018-06-29 NOTE — Care Management Important Message (Signed)
Important Message  Patient Details IM Letter given to Servando Snare SW to present to the Patient Name: [name] [name] MRN: [id number] Date of Birth: 07-04-1931   Medicare Important Message Given:  Yes     Kerin Salen 06/29/2018, 1:28 PM

## 2018-06-29 NOTE — Progress Notes (Signed)
Nutrition Follow-up  INTERVENTION:   -D/c Ensure -as pt is not drinking -Provide Boost Breeze po BID, each supplement provides 250 kcal and 9 grams of protein -Provide Magic cup TID with meals, each supplement provides 290 kcal and 9 grams of protein -Provide Multivitamin with minerals daily  NUTRITION DIAGNOSIS:   Inadequate oral intake related to nausea, vomiting as evidenced by per patient/family report.  Ongoing.  GOAL:   Patient will meet greater than or equal to 90% of their needs  Not meeting.  MONITOR:   PO intake, Supplement acceptance, Labs, Weight trends, I & O's  REASON FOR ASSESSMENT:   Consult Assessment of nutrition requirement/status, Poor PO  ASSESSMENT:   85 y.o. female with medical history significant for dementia, coronary artery disease, ischemic cardiomyopathy, hypothyroidism, and type 2 diabetes mellitus, now presenting to emergency department for evaluation of nausea, vomiting, and low blood pressure.  Patient has reportedly been experiencing nausea with nonbloody vomiting for the past 3 to 4 weeks, has been able to tolerate cola, but has vomited anytime she attempts to drink water or eat.  **RD working remotely**  Diet was advanced to regular diet yesterday 6/21. Patient has not been eating well. On 6/21, pt consumed 10-25% of meals. She has not been drinking Ensure supplements, RD will d/c. Will try Boost Breeze supplements and Magic Cups with meals for kcal and protein. Given poor PO for ~1 month, added daily MVI. Pt has not had a BM this admission which may be a factor with intake.  Pt's weight is trending upwards since weighing 104 lbs on 6/19. Per I/O's: +2.5L since admit.  Medications: Folic acid tablet daily, IV Reglan every 8 hours, Miralax packet daily, Senokot tablet BID, IV Mg sulfate Labs reviewed: CBGs: 113-120 Low Mg  Diet Order:   Diet Order            Diet regular Room service appropriate? Yes; Fluid consistency: Thin  Diet  effective now              EDUCATION NEEDS:   No education needs have been identified at this time  Skin:  Skin Assessment: Reviewed RN Assessment  Last BM:  PTA  Height:   Ht Readings from Last 1 Encounters:  06/24/18 5\' 1"  (1.549 m)    Weight:   Wt Readings from Last 1 Encounters:  06/28/18 52.7 kg    Ideal Body Weight:  47.7 kg  BMI:  Body mass index is 21.95 kg/m.  Estimated Nutritional Needs:   Kcal:  1350-1550  Protein:  60-70g  Fluid:  1.5L/day  Clayton Bibles, MS, RD, LDN [facility] Pager: [phone] After Hours Pager: [phone]

## 2018-06-29 NOTE — Progress Notes (Signed)
PROGRESS NOTE    [name] [name]  QMG:[id number] DOB: 1931-03-21 DOA: 06/24/2018 PCP: [name], MD     Brief Narrative:  [name] [name] is a 85 y.o. female with medical history significant for dementia, coronary artery disease, ischemic cardiomyopathy, hypothyroidism, and type 2 diabetes mellitus, now presenting to emergency department for evaluation of nausea, vomiting, and low blood pressure.  Patient has reportedly been experiencing nausea with nonbloody vomiting for the past 3 to 4 weeks, has been able to tolerate cola, but has vomited anytime she attempts to drink water or eat.  She has not been taking her medications "for a long time" per report of her son.  She was taken to see her PCP for evaluation of these complaints, was noted to have a blood pressure of 84/52, and was directed to the ED for further evaluation.  She was given 250 cc of IV fluids with EMS prior to arrival in the ED. patient denies abdominal pain, diarrhea, fevers, or chills.  She denies any dysphasia. KUB features a nonobstructive bowel gas pattern with large amount of stool in the colon. Patient underwent RUQ [place] which was concerning for cholelithiasis with cholecystitis.  General surgery as well as cardiology were consulted for cardiac clearance. Patient underwent HIDA scan which was normal.  General surgery does not feel patient's gallbladder is her primary issue for symptoms.  Possibly gastroparesis.  New events last 24 hours / Subjective: No complaints on examination.  No further nausea or vomiting, declined suppository yesterday.  Had some pear yesterday, oral intake has been about 10 to 25%.  Dietitian was consulted.  Assessment & Plan:   Principal Problem:   Hypotension due to hypovolemia Active Problems:   Essential hypertension   Hypothyroidism   Coronary artery disease   Mild dementia (HCC)   Type 2 diabetes mellitus (HCC)   Nausea & vomiting   Leukopenia   History of ischemic cardiomyopathy  Hyponatremia   Poor oral intake Intractable nausea and vomiting -KUB is negative for obstruction but notable for heavy colonic stool burden  -RUQ US revealed cholelithiasis with gallbladder wall thickening concerning for acute cholecystitis -HIDA scan with normal gallbladder filling, normal EF.  Stop Rocephin, Flagyl -General surgery consulted and signed off 6/20 -Possibly secondary to gastroparesis from longstanding diabetes, would recommend gastric emptying study as an outpatient.  Start empiric Reglan.   -No further nausea and vomiting.  Patient has not had a bowel movement since admission.  Trial suppository and/or enema  Hypotension  -Patient presents from PCP office where she was seen for 3-4 wks of N/V and found to have SBP in low 80's.  This is likely secondary to hypovolemia in setting of persistent nausea and vomiting.  Blood pressure has improved after IV fluids -Hold lisinopril, verapamil, imdur until BP further stabilized  -BP this morning stable 113/56 -Continue IV fluids until p.o. intake adequate  Type II DM  -Hemoglobin A1c 6.1  -Hold metformin  -Sliding scale insulin  CAD; history of ischemic cardiomyopathy, s/p CABG  -Stable. Continue aspirin and Lipitor -Appreciate cardiology.  Echocardiogram with EF 60 to 65%, moderate thickening of aortic valve  Hyponatremia  -Resolved with IV fluids  History of hypothyroidism -Free T4 1.25, TSH 0.288. Patient does not remember if she is on thyroid medications. Per chart review, it looks like it was discontinued back in 2015. Patient also unsure if she takes biotin supplements. Would repeat labs as outpatient in several weeks.   Dementia -Follows Neurology Dr. Delice Lesch. Slowly progressed  over the past year    Dental pain -CT maxillofacial reveals periapical lucency right central incisor, no subperiosteal abscess. Follow up with primary dentist outpatient   Pyuria -Initially concerned about UTI, UA with moderate leukocytes  and rare bacteria, 6-10 WBC. No complaints of dysuria. Stop rocephin.   Hypomagnesemia -Replace, trend    DVT prophylaxis: Lovenox Code Status: Full Family Communication: Called son with update, no answer  Disposition Plan: Pending improvement of oral intake, bowel movement. PT OT recommending home health on discharge.   Consultants:   General surgery  Cardiology  Procedures:   None   Antimicrobials:  Anti-infectives (From admission, onward)   Start     Dose/Rate Route Frequency Ordered Stop   06/27/18 0600  metroNIDAZOLE (FLAGYL) IVPB 500 mg  Status:  Discontinued     500 mg 100 mL/hr over 60 Minutes Intravenous Every 8 hours 06/26/18 1825 06/27/18 0910   06/26/18 1845  metroNIDAZOLE (FLAGYL) IVPB 500 mg  Status:  Discontinued     500 mg 100 mL/hr over 60 Minutes Intravenous  Once 06/26/18 1825 06/27/18 0910   06/26/18 1200  cefTRIAXone (ROCEPHIN) 2 g in sodium chloride 0.9 % 100 mL IVPB  Status:  Discontinued     2 g 200 mL/hr over 30 Minutes Intravenous Every 24 hours 06/26/18 1025 06/27/18 0910   06/25/18 2000  cefTRIAXone (ROCEPHIN) 1 g in sodium chloride 0.9 % 100 mL IVPB  Status:  Discontinued     1 g 200 mL/hr over 30 Minutes Intravenous Every 24 hours 06/24/18 2154 06/25/18 1253   06/24/18 1945  cefTRIAXone (ROCEPHIN) 1 g in sodium chloride 0.9 % 100 mL IVPB     1 g 200 mL/hr over 30 Minutes Intravenous  Once 06/24/18 1944 06/24/18 2045       Objective: Vitals:   06/28/18 0408 06/28/18 1404 06/28/18 2009 06/29/18 0559  BP: (!) 95/49 117/68 (!) 125/58 (!) 113/56  Pulse: 64 68 80 77  Resp: 16 20 18 18   Temp: 98.7 F (37.1 C) 97.8 F (36.6 C) 100.1 F (37.8 C) 98.8 F (37.1 C)  TempSrc: Oral  Oral Oral  SpO2: 96% 99% 96% 95%  Weight: 52.7 kg       Intake/Output Summary (Last 24 hours) at 06/29/2018 1247 Last data filed at 06/28/2018 1800 Gross per 24 hour  Intake 80 ml  Output -  Net 80 ml   Filed Weights   06/26/18 0658 06/27/18 0500 06/28/18  0408  Weight: 47.2 kg 51.5 kg 52.7 kg    Examination: General exam: Appears calm and comfortable  Respiratory system: Clear to auscultation. Respiratory effort normal. Cardiovascular system: S1 & S2 heard, RRR. No JVD, murmurs, rubs, gallops or clicks. No pedal edema. Gastrointestinal system: Abdomen is nondistended, soft and nontender. No organomegaly or masses felt. Normal bowel sounds heard. Central nervous system: Alert. Nonfocal. Extremities: Symmetric 5 x 5 power. Skin: No rashes, lesions or ulcers Psychiatry: Dementia   Data Reviewed: I have personally reviewed following labs and imaging studies  CBC: Recent Labs  Lab 06/24/18 1739 06/25/18 0526 06/26/18 0520 06/27/18 0504 06/28/18 0552 06/29/18 0539  WBC 3.6* 3.3* 4.0 4.4 3.5* 5.8  NEUTROABS 1.6* 1.3*  --   --   --   --   HGB 11.0* 9.3* 9.2* 8.9* 8.5* 8.6*  HCT 32.6* 27.9* 27.6* 27.3* 25.9* 27.2*  MCV 90.8 92.1 92.3 92.9 93.2 94.8  PLT 182 159 159 151 150 035   Basic Metabolic Panel: Recent Labs  Lab 06/25/18  1448 06/26/18 0520 06/27/18 0504 06/28/18 0552 06/29/18 0539  NA 135 136 138 138 137  K 3.6 3.6 3.2* 2.9* 3.9  CL 102 103 105 105 109  CO2 23 21* 23 25 21*  GLUCOSE 103* 73 127* 150* 132*  BUN 11 8 8  <5* <5*  CREATININE 0.76 0.72 0.60 0.62 0.58  CALCIUM 8.1* 8.4* 8.3* 8.0* 8.0*  MG  --   --   --  1.4* 1.6*   GFR: Estimated Creatinine Clearance: 39.5 mL/min (by C-G formula based on SCr of 0.58 mg/dL). Liver Function Tests: Recent Labs  Lab 06/24/18 1739 06/25/18 0526 06/26/18 0520 06/27/18 0504  AST 16 13* 14* 12*  ALT 9 9 9 9   ALKPHOS 33* 31* 31* 30*  BILITOT 1.0 1.0 1.3* 0.7  PROT 7.1 6.4* 6.2* 5.9*  ALBUMIN 3.9 3.5 3.4* 3.2*   Recent Labs  Lab 06/24/18 1739  LIPASE 70*   No results for input(s): AMMONIA in the last 168 hours. Coagulation Profile: No results for input(s): INR, PROTIME in the last 168 hours. Cardiac Enzymes: No results for input(s): CKTOTAL, CKMB, CKMBINDEX,  TROPONINI in the last 168 hours. BNP (last 3 results) No results for input(s): PROBNP in the last 8760 hours. HbA1C: No results for input(s): HGBA1C in the last 72 hours. CBG: Recent Labs  Lab 06/28/18 2145 06/29/18 0103 06/29/18 0419 06/29/18 0729 06/29/18 1152  GLUCAP 103* 118* 120* 113* 116*   Lipid Profile: No results for input(s): CHOL, HDL, LDLCALC, TRIG, CHOLHDL, LDLDIRECT in the last 72 hours. Thyroid Function Tests: No results for input(s): TSH, T4TOTAL, FREET4, T3FREE, THYROIDAB in the last 72 hours. Anemia Panel: No results for input(s): VITAMINB12, FOLATE, FERRITIN, TIBC, IRON, RETICCTPCT in the last 72 hours. Sepsis Labs: Recent Labs  Lab 06/24/18 2052  LATICACIDVEN 1.0    Recent Results (from the past 240 hour(s))  Urine culture     Status: Abnormal   Collection Time: 06/24/18  6:39 PM   Specimen: Urine, Random  Result Value Ref Range Status   Specimen Description   Final    URINE, RANDOM Performed at [facility] [address] [place], [place] [address]    Special Requests   Final    NONE Performed at [facility], [place] [address] [place], [place] [address]    Culture (A)  Final    <10,000 COLONIES/mL INSIGNIFICANT GROWTH Performed at [facility] [address], [place], [place] [address]    Report Status 06/26/2018 FINAL  Final  Culture, blood (Routine X 2) w Reflex to ID Panel     Status: Abnormal   Collection Time: 06/24/18  9:23 PM   Specimen: BLOOD LEFT HAND  Result Value Ref Range Status   Specimen Description   Final    BLOOD LEFT HAND Performed at [facility] [address], [place], [place] [address]    Special Requests   Final    BOTTLES DRAWN AEROBIC ONLY Blood Culture adequate volume Performed at [facility] [address] [facility], [place] [address]    Culture  Setup Time   Final    AEROBIC BOTTLE ONLY GRAM POSITIVE RODS CRITICAL RESULT  CALLED TO, READ BACK BY AND VERIFIED WITH: Shelda Jakes [facility] 06/26/18 1750 JDW    Culture (A)  Final    DIPHTHEROIDS(CORYNEBACTERIUM SPECIES) Standardized susceptibility testing for this organism is not available. Performed at [facility], [place] [address], [place], [place] [address]  Report Status 06/27/2018 FINAL  Final  SARS Coronavirus 2 (CEPHEID - Performed in [facility] hospital lab), Hosp Order     Status: None   Collection Time: 06/24/18  9:26 PM   Specimen: Nasopharyngeal Swab  Result Value Ref Range Status   SARS Coronavirus 2 NEGATIVE NEGATIVE Final    Comment: (NOTE) If result is NEGATIVE SARS-CoV-2 target nucleic acids are NOT DETECTED. The SARS-CoV-2 RNA is generally detectable in upper and lower  respiratory specimens during the acute phase of infection. The lowest  concentration of SARS-CoV-2 viral copies this assay can detect is 250  copies / mL. A negative result does not preclude SARS-CoV-2 infection  and should not be used as the sole basis for treatment or other  patient management decisions.  A negative result may occur with  improper specimen collection / handling, submission of specimen other  than nasopharyngeal swab, presence of viral mutation(s) within the  areas targeted by this assay, and inadequate number of viral copies  (<250 copies / mL). A negative result must be combined with clinical  observations, patient history, and epidemiological information. If result is POSITIVE SARS-CoV-2 target nucleic acids are DETECTED. The SARS-CoV-2 RNA is generally detectable in upper and lower  respiratory specimens dur ing the acute phase of infection.  Positive  results are indicative of active infection with SARS-CoV-2.  Clinical  correlation with patient history and other diagnostic information is  necessary to determine patient infection status.  Positive results do  not rule out bacterial infection or co-infection with other viruses. If result is  PRESUMPTIVE POSTIVE SARS-CoV-2 nucleic acids MAY BE PRESENT.   A presumptive positive result was obtained on the submitted specimen  and confirmed on repeat testing.  While 2019 novel coronavirus  (SARS-CoV-2) nucleic acids may be present in the submitted sample  additional confirmatory testing may be necessary for epidemiological  and / or clinical management purposes  to differentiate between  SARS-CoV-2 and other Sarbecovirus currently known to infect humans.  If clinically indicated additional testing with an alternate test  methodology [phone]) is advised. The SARS-CoV-2 RNA is generally  detectable in upper and lower respiratory sp ecimens during the acute  phase of infection. The expected result is Negative. Fact Sheet for Patients:  StrictlyIdeas.no Fact Sheet for Healthcare Providers: BankingDealers.co.za This test is not yet approved or cleared by the [place] FDA and has been authorized for detection and/or diagnosis of SARS-CoV-2 by FDA under an Emergency Use Authorization (EUA).  This EUA will remain in effect (meaning this test can be used) for the duration of the COVID-19 declaration under Section 564(b)(1) of the Act, 21 U.S.C. section 360bbb-3(b)(1), unless the authorization is terminated or revoked sooner. Performed at [facility], [place] [address], [place], [place] [address]   Culture, blood (Routine X 2) w Reflex to ID Panel     Status: None   Collection Time: 06/24/18 10:34 PM   Specimen: BLOOD LEFT HAND  Result Value Ref Range Status   Specimen Description   Final    BLOOD LEFT HAND Performed at [facility] [address] [place], [place] [address]    Special Requests   Final    BOTTLES DRAWN AEROBIC AND ANAEROBIC Blood Culture adequate volume Performed at [facility] [address], [place], [place] [address]    Culture   Final    NO GROWTH 5  DAYS Performed at [facility], [place] [address], [place], [place]  [address]    Report Status 06/29/2018 FINAL  Final       Radiology Studies: No results found.    Scheduled Meds: . aspirin  325 mg Oral Daily  . atorvastatin  80 mg Oral q1800  . enoxaparin (LOVENOX) injection  30 mg Subcutaneous QHS  . feeding supplement  1 Container Oral BID BM  . folic acid  1 mg Oral Daily  . insulin aspart  0-9 Units Subcutaneous Q4H  . metoCLOPramide (REGLAN) injection  5 mg Intravenous Q8H  . mirtazapine  15 mg Oral QHS  . multivitamin with minerals  1 tablet Oral Daily  . polyethylene glycol  17 g Oral Daily  . senna  1 tablet Oral BID  . sodium chloride flush  3 mL Intravenous Q12H  . sodium chloride flush  3 mL Intravenous Q12H   Continuous Infusions: . sodium chloride 10 mL/hr at 06/25/18 0500  . dextrose 5 % and 0.45% NaCl 75 mL/hr at 06/28/18 1040     LOS: 4 days    Time spent: 25 minutes   Dessa Phi, DO [facility] [email or web address] 06/29/2018, 12:47 PM

## 2018-06-29 NOTE — Progress Notes (Signed)
PT Cancellation Note  Patient Details Name: ADELL KOVAL MRN: [id number] DOB: 1931-02-01   Cancelled Treatment:    Reason Eval/Treat Not Completed: Fatigue/lethargy limiting ability to participate Pt sleeping on arrival.  Pt aroused however did not want to participate at this time and requested to rest.   Aniqa Hare,KATHrine E 06/29/2018, 3:26 PM Carmelia Bake, PT, DPT Acute Rehabilitation Services Office: [phone] Pager: [phone]

## 2018-06-30 LAB — BASIC METABOLIC PANEL
Anion gap: 7 (ref 5–15)
BUN: <5 mg/dL — ABNORMAL LOW (ref 8–23)
CO2: 21 mmol/L — ABNORMAL LOW (ref 22–32)
Calcium: 7.9 mg/dL — ABNORMAL LOW (ref 8.9–10.3)
Chloride: 108 mmol/L (ref 98–111)
Creatinine, Ser: 0.51 mg/dL (ref 0.44–1.00)
GFR calc Af Amer: >60 mL/min (ref >60)
GFR calc non Af Amer: >60 mL/min (ref >60)
Glucose, Bld: 131 mg/dL — ABNORMAL HIGH (ref 70–99)
Potassium: 4 mmol/L (ref 3.5–5.1)
Sodium: 136 mmol/L (ref 135–145)

## 2018-06-30 LAB — GLUCOSE, CAPILLARY
Glucose-Capillary: 122 mg/dL — ABNORMAL HIGH (ref 70–99)
Glucose-Capillary: 125 mg/dL — ABNORMAL HIGH (ref 70–99)
Glucose-Capillary: 134 mg/dL — ABNORMAL HIGH (ref 70–99)
Glucose-Capillary: 219 mg/dL — ABNORMAL HIGH (ref 70–99)
Glucose-Capillary: 72 mg/dL (ref 70–99)
Glucose-Capillary: 79 mg/dL (ref 70–99)

## 2018-06-30 LAB — CBC
HCT: 25.1 % — ABNORMAL LOW (ref 36.0–46.0)
Hemoglobin: 8 g/dL — ABNORMAL LOW (ref 12.0–15.0)
MCH: 30.5 pg (ref 26.0–34.0)
MCHC: 31.9 g/dL (ref 30.0–36.0)
MCV: 95.8 fL (ref 80.0–100.0)
Platelets: 141 10*3/uL — ABNORMAL LOW (ref 150–400)
RBC: 2.62 MIL/uL — ABNORMAL LOW (ref 3.87–5.11)
RDW: 14.6 % (ref 11.5–15.5)
WBC: 3.4 10*3/uL — ABNORMAL LOW (ref 4.0–10.5)
nRBC: 0 % (ref 0.0–0.2)

## 2018-06-30 MED ORDER — FLEET ENEMA 7-19 GM/118ML RE ENEM: 1.0000 | ENEMA | Freq: Once | RECTAL | Status: DC | PRN

## 2018-06-30 MED ORDER — GLYCERIN (LAXATIVE) 2.1 G RE SUPP
1.0000 | Freq: Once | RECTAL | Status: AC
  Administered 2018-06-30: 1 via RECTAL
  Filled 2018-06-30: qty 1

## 2018-06-30 NOTE — Progress Notes (Signed)
PROGRESS NOTE    [facility]I BUTRUM  VQQ:[id number] DOB: Apr 17, 1931 DOA: 06/24/2018 PCP: [name], MD     Brief Narrative:  [name] [name] is a 85 y.o. female with medical history significant for dementia, coronary artery disease, ischemic cardiomyopathy, hypothyroidism, and type 2 diabetes mellitus, now presenting to emergency department for evaluation of nausea, vomiting, and low blood pressure.  Patient has reportedly been experiencing nausea with nonbloody vomiting for the past 3 to 4 weeks, has been able to tolerate cola, but has vomited anytime she attempts to drink water or eat.  She has not been taking her medications "for a long time" per report of her son.  She was taken to see her PCP for evaluation of these complaints, was noted to have a blood pressure of 84/52, and was directed to the ED for further evaluation.  She was given 250 cc of IV fluids with EMS prior to arrival in the ED. patient denies abdominal pain, diarrhea, fevers, or chills.  She denies any dysphasia. KUB features a nonobstructive bowel gas pattern with large amount of stool in the colon. Patient underwent RUQ [place] which was concerning for cholelithiasis with cholecystitis.  General surgery as well as cardiology were consulted for cardiac clearance. Patient underwent HIDA scan which was normal.  General surgery does not feel patient's gallbladder is her primary issue for symptoms.  Possibly gastroparesis.  New events last 24 hours / Subjective: No complaints on exam. No episodes of nausea, vomiting. Had suppository yesterday without result.   Assessment & Plan:   Principal Problem:   Hypotension due to hypovolemia Active Problems:   Essential hypertension   Hypothyroidism   Coronary artery disease   Mild dementia (HCC)   Type 2 diabetes mellitus (HCC)   Nausea & vomiting   Leukopenia   History of ischemic cardiomyopathy   Hyponatremia   Poor oral intake Intractable nausea and vomiting -KUB is negative  for obstruction but notable for heavy colonic stool burden  -RUQ US revealed cholelithiasis with gallbladder wall thickening concerning for acute cholecystitis -HIDA scan with normal gallbladder filling, normal EF.  Stop Rocephin, Flagyl -General surgery consulted and signed off 6/20 -Possibly secondary to gastroparesis from longstanding diabetes, would recommend gastric emptying study as an outpatient.  Start empiric Reglan.   -No further nausea and vomiting.  Patient has not had a bowel movement since admission.  Trial suppository and/or enema today.   Hypotension  -Patient presents from PCP office where she was seen for 3-4 wks of N/V and found to have SBP in low 80's.  This is likely secondary to hypovolemia in setting of persistent nausea and vomiting.  Blood pressure has improved after IV fluids -Hold lisinopril, verapamil, imdur until BP further stabilized  -BP this morning stable 100/54  -Continue IV fluids until p.o. intake adequate  Type II DM  -Hemoglobin A1c 6.1  -Hold metformin  -Sliding scale insulin  CAD; history of ischemic cardiomyopathy, s/p CABG  -Stable. Continue aspirin and Lipitor -Appreciate cardiology.  Echocardiogram with EF 60 to 65%, moderate thickening of aortic valve  Hyponatremia  -Resolved with IV fluids  History of hypothyroidism -Free T4 1.25, TSH 0.288. Patient does not remember if she is on thyroid medications. Per chart review, it looks like it was discontinued back in 2015. Patient also unsure if she takes biotin supplements. Would repeat labs as outpatient in several weeks.   Dementia -Follows Neurology Dr. Delice Lesch. Slowly progressed over the past year    Dental pain -CT  maxillofacial reveals periapical lucency right central incisor, no subperiosteal abscess. Follow up with primary dentist outpatient   Pyuria -Initially concerned about UTI, UA with moderate leukocytes and rare bacteria, 6-10 WBC. No complaints of dysuria. Stop rocephin.     DVT prophylaxis: Lovenox Code Status: Full Family Communication: Called son with update today Disposition Plan: Pending improvement of oral intake, bowel movement. PT OT recommending home health on discharge.   Consultants:   General surgery  Cardiology  Procedures:   None   Antimicrobials:  Anti-infectives (From admission, onward)   Start     Dose/Rate Route Frequency Ordered Stop   06/27/18 0600  metroNIDAZOLE (FLAGYL) IVPB 500 mg  Status:  Discontinued     500 mg 100 mL/hr over 60 Minutes Intravenous Every 8 hours 06/26/18 1825 06/27/18 0910   06/26/18 1845  metroNIDAZOLE (FLAGYL) IVPB 500 mg  Status:  Discontinued     500 mg 100 mL/hr over 60 Minutes Intravenous  Once 06/26/18 1825 06/27/18 0910   06/26/18 1200  cefTRIAXone (ROCEPHIN) 2 g in sodium chloride 0.9 % 100 mL IVPB  Status:  Discontinued     2 g 200 mL/hr over 30 Minutes Intravenous Every 24 hours 06/26/18 1025 06/27/18 0910   06/25/18 2000  cefTRIAXone (ROCEPHIN) 1 g in sodium chloride 0.9 % 100 mL IVPB  Status:  Discontinued     1 g 200 mL/hr over 30 Minutes Intravenous Every 24 hours 06/24/18 2154 06/25/18 1253   06/24/18 1945  cefTRIAXone (ROCEPHIN) 1 g in sodium chloride 0.9 % 100 mL IVPB     1 g 200 mL/hr over 30 Minutes Intravenous  Once 06/24/18 1944 06/24/18 2045       Objective: Vitals:   06/29/18 1331 06/29/18 2047 06/30/18 0500 06/30/18 0630  BP: 113/66 (!) 96/45 (!) 97/47 (!) 100/54  Pulse: 69 70 75 63  Resp: 18 17 18    Temp: 98.1 F (36.7 C) 98.2 F (36.8 C) 98.1 F (36.7 C)   TempSrc: Oral Oral Oral   SpO2: 96% 97% 98%   Weight:   53.5 kg     Intake/Output Summary (Last 24 hours) at 06/30/2018 0944 Last data filed at 06/30/2018 0543 Gross per 24 hour  Intake 934.57 ml  Output -  Net 934.57 ml   Filed Weights   06/27/18 0500 06/28/18 0408 06/30/18 0500  Weight: 51.5 kg 52.7 kg 53.5 kg    Examination: General exam: Appears calm and comfortable  Respiratory system: Clear  to auscultation. Respiratory effort normal. Cardiovascular system: S1 & S2 heard, RRR. No JVD, murmurs, rubs, gallops or clicks. No pedal edema. Gastrointestinal system: Abdomen is nondistended, soft and nontender. No organomegaly or masses felt. Normal bowel sounds heard. Central nervous system: Alert, nonfocal, clear speech Extremities: Symmetric Skin: No rashes, lesions or ulcers Psychiatry: Stable    Data Reviewed: I have personally reviewed following labs and imaging studies  CBC: Recent Labs  Lab 06/24/18 1739 06/25/18 0526 06/26/18 0520 06/27/18 0504 06/28/18 0552 06/29/18 0539 06/30/18 0724  WBC 3.6* 3.3* 4.0 4.4 3.5* 5.8 3.4*  NEUTROABS 1.6* 1.3*  --   --   --   --   --   HGB 11.0* 9.3* 9.2* 8.9* 8.5* 8.6* 8.0*  HCT 32.6* 27.9* 27.6* 27.3* 25.9* 27.2* 25.1*  MCV 90.8 92.1 92.3 92.9 93.2 94.8 95.8  PLT 182 159 159 151 150 156 147*   Basic Metabolic Panel: Recent Labs  Lab 06/26/18 0520 06/27/18 0504 06/28/18 0552 06/29/18 0539 06/30/18 0724  NA  136 138 138 137 136  K 3.6 3.2* 2.9* 3.9 4.0  CL 103 105 105 109 108  CO2 21* 23 25 21* 21*  GLUCOSE 73 127* 150* 132* 131*  BUN 8 8 <5* <5* <5*  CREATININE 0.72 0.60 0.62 0.58 0.51  CALCIUM 8.4* 8.3* 8.0* 8.0* 7.9*  MG  --   --  1.4* 1.6*  --    GFR: Estimated Creatinine Clearance: 39.5 mL/min (by C-G formula based on SCr of 0.51 mg/dL). Liver Function Tests: Recent Labs  Lab 06/24/18 1739 06/25/18 0526 06/26/18 0520 06/27/18 0504  AST 16 13* 14* 12*  ALT 9 9 9 9   ALKPHOS 33* 31* 31* 30*  BILITOT 1.0 1.0 1.3* 0.7  PROT 7.1 6.4* 6.2* 5.9*  ALBUMIN 3.9 3.5 3.4* 3.2*   Recent Labs  Lab 06/24/18 1739  LIPASE 70*   No results for input(s): AMMONIA in the last 168 hours. Coagulation Profile: No results for input(s): INR, PROTIME in the last 168 hours. Cardiac Enzymes: No results for input(s): CKTOTAL, CKMB, CKMBINDEX, TROPONINI in the last 168 hours. BNP (last 3 results) No results for input(s):  PROBNP in the last 8760 hours. HbA1C: No results for input(s): HGBA1C in the last 72 hours. CBG: Recent Labs  Lab 06/29/18 2020 06/29/18 2341 06/30/18 0017 06/30/18 0455 06/30/18 0733  GLUCAP 178* 57* 79 125* 122*   Lipid Profile: No results for input(s): CHOL, HDL, LDLCALC, TRIG, CHOLHDL, LDLDIRECT in the last 72 hours. Thyroid Function Tests: No results for input(s): TSH, T4TOTAL, FREET4, T3FREE, THYROIDAB in the last 72 hours. Anemia Panel: No results for input(s): VITAMINB12, FOLATE, FERRITIN, TIBC, IRON, RETICCTPCT in the last 72 hours. Sepsis Labs: Recent Labs  Lab 06/24/18 2052  LATICACIDVEN 1.0    Recent Results (from the past 240 hour(s))  Urine culture     Status: Abnormal   Collection Time: 06/24/18  6:39 PM   Specimen: Urine, Random  Result Value Ref Range Status   Specimen Description   Final    URINE, RANDOM Performed at [facility] [address], [place], [place] [address]    Special Requests   Final    NONE Performed at [facility], [place] [address], [place], [place] [address]    Culture (A)  Final    <10,000 COLONIES/mL INSIGNIFICANT GROWTH Performed at [facility] [address], [place], [place] [address]    Report Status 06/26/2018 FINAL  Final  Culture, blood (Routine X 2) w Reflex to ID Panel     Status: Abnormal   Collection Time: 06/24/18  9:23 PM   Specimen: BLOOD LEFT HAND  Result Value Ref Range Status   Specimen Description   Final    BLOOD LEFT HAND Performed at [facility] [address] [place], [place] [address]    Special Requests   Final    BOTTLES DRAWN AEROBIC ONLY Blood Culture adequate volume Performed at [facility] [address], [place], [place] [address]    Culture  Setup Time   Final    AEROBIC BOTTLE ONLY GRAM POSITIVE RODS CRITICAL RESULT CALLED TO, READ BACK BY AND VERIFIED WITH: Shelda Jakes [facility] 06/26/18 1750 JDW     Culture (A)  Final    DIPHTHEROIDS(CORYNEBACTERIUM SPECIES) Standardized susceptibility testing for this organism is not available. Performed at [facility], [place] [address], [place],  [address]    Report Status 06/27/2018 FINAL  Final  SARS Coronavirus 2 (  CEPHEID - Performed in [facility] hospital lab), Hosp Order     Status: None   Collection Time: 06/24/18  9:26 PM   Specimen: Nasopharyngeal Swab  Result Value Ref Range Status   SARS Coronavirus 2 NEGATIVE NEGATIVE Final    Comment: (NOTE) If result is NEGATIVE SARS-CoV-2 target nucleic acids are NOT DETECTED. The SARS-CoV-2 RNA is generally detectable in upper and lower  respiratory specimens during the acute phase of infection. The lowest  concentration of SARS-CoV-2 viral copies this assay can detect is 250  copies / mL. A negative result does not preclude SARS-CoV-2 infection  and should not be used as the sole basis for treatment or other  patient management decisions.  A negative result may occur with  improper specimen collection / handling, submission of specimen other  than nasopharyngeal swab, presence of viral mutation(s) within the  areas targeted by this assay, and inadequate number of viral copies  (<250 copies / mL). A negative result must be combined with clinical  observations, patient history, and epidemiological information. If result is POSITIVE SARS-CoV-2 target nucleic acids are DETECTED. The SARS-CoV-2 RNA is generally detectable in upper and lower  respiratory specimens dur ing the acute phase of infection.  Positive  results are indicative of active infection with SARS-CoV-2.  Clinical  correlation with patient history and other diagnostic information is  necessary to determine patient infection status.  Positive results do  not rule out bacterial infection or co-infection with other viruses. If result is PRESUMPTIVE POSTIVE SARS-CoV-2 nucleic acids MAY BE PRESENT.   A presumptive positive  result was obtained on the submitted specimen  and confirmed on repeat testing.  While 2019 novel coronavirus  (SARS-CoV-2) nucleic acids may be present in the submitted sample  additional confirmatory testing may be necessary for epidemiological  and / or clinical management purposes  to differentiate between  SARS-CoV-2 and other Sarbecovirus currently known to infect humans.  If clinically indicated additional testing with an alternate test  methodology [phone]) is advised. The SARS-CoV-2 RNA is generally  detectable in upper and lower respiratory sp ecimens during the acute  phase of infection. The expected result is Negative. Fact Sheet for Patients:  StrictlyIdeas.no Fact Sheet for Healthcare Providers: BankingDealers.co.za This test is not yet approved or cleared by the [place] FDA and has been authorized for detection and/or diagnosis of SARS-CoV-2 by FDA under an Emergency Use Authorization (EUA).  This EUA will remain in effect (meaning this test can be used) for the duration of the COVID-19 declaration under Section 564(b)(1) of the Act, 21 U.S.C. section 360bbb-3(b)(1), unless the authorization is terminated or revoked sooner. Performed at [facility], [place] [address], [place], [place] [address]   Culture, blood (Routine X 2) w Reflex to ID Panel     Status: None   Collection Time: 06/24/18 10:34 PM   Specimen: BLOOD LEFT HAND  Result Value Ref Range Status   Specimen Description   Final    BLOOD LEFT HAND Performed at [facility] [address] [place], [place] [address]    Special Requests   Final    BOTTLES DRAWN AEROBIC AND ANAEROBIC Blood Culture adequate volume Performed at [facility] [address], [place], [place] [address]    Culture   Final    NO GROWTH 5 DAYS Performed at [facility], [place] [address], [place], [place] [address]     Report Status 06/29/2018 FINAL  Final  Radiology Studies: No results found.    Scheduled Meds: . aspirin  325 mg Oral Daily  . atorvastatin  80 mg Oral q1800  . enoxaparin (LOVENOX) injection  30 mg Subcutaneous QHS  . feeding supplement  1 Container Oral BID BM  . folic acid  1 mg Oral Daily  . Glycerin (Adult)  1 suppository Rectal Once  . insulin aspart  0-9 Units Subcutaneous Q4H  . metoCLOPramide (REGLAN) injection  5 mg Intravenous Q8H  . mirtazapine  15 mg Oral QHS  . multivitamin with minerals  1 tablet Oral Daily  . polyethylene glycol  17 g Oral Daily  . senna  1 tablet Oral BID  . sodium chloride flush  3 mL Intravenous Q12H  . sodium chloride flush  3 mL Intravenous Q12H   Continuous Infusions: . sodium chloride Stopped (06/30/18 0131)  . dextrose 5 % and 0.45% NaCl 75 mL/hr at 06/30/18 0316     LOS: 5 days    Time spent: 25 minutes   Dessa Phi, DO [facility] [email or web address] 06/30/2018, 9:44 AM

## 2018-06-30 NOTE — TOC Initial Note (Signed)
Transition of Care [facility]) - Initial/Assessment Note    Patient Details  Name: [name] [name] MRN: [id number] Date of Birth: Dec 18, 1931  Transition of Care (TOC) CM/SW Contact:    Joaquin Courts, RN Phone Number: 06/30/2018, 3:32 PM  Clinical Narrative:   CM spoke with patient at bedside, who asks that cm communicate with son, matthew.  CM spoke with son via telephone regarding recommendation for HHPT. Son is not ready to make a decision at this time whether they want [facility] and which agency to use. CM to follow-up for decision. Son also states that patient has a walker at home that is in good condition and used to belong to patient's mother who passed nine years ago and does not feel that she needs a new one.                  Expected Discharge Plan: [place] Barriers to Discharge: Continued Medical Work up   Patient Goals and CMS Choice Patient states their goals for this hospitalization and ongoing recovery are:: to get better CMS Medicare.gov Compare Post Acute Care list provided to:: Patient Represenative (must comment)(son, mathew) Choice offered to / list presented to : Adult Children  Expected Discharge Plan and Services Expected Discharge Plan: [place]   Discharge Planning Services: CM Consult Post Acute Care Choice: [place] arrangements for the past 2 months: Single Family Home                 DME Arranged: N/A DME Agency: NA       HH Arranged: NA HH Agency: NA        Prior Living Arrangements/Services Living arrangements for the past 2 months: Single Family Home Lives with:: Spouse Patient language and need for interpreter reviewed:: Yes Do you feel safe going back to the place where you live?: Yes      Need for Family Participation in Patient Care: Yes (Comment) Care giver support system in place?: Yes (comment)   Criminal Activity/Legal Involvement Pertinent to Current Situation/Hospitalization: No -  Comment as needed  Activities of Daily Living Home Assistive Devices/Equipment: None ADL Screening (condition at time of admission) Patient's cognitive ability adequate to safely complete daily activities?: Yes Is the patient deaf or have difficulty hearing?: No Does the patient have difficulty seeing, even when wearing glasses/contacts?: No Does the patient have difficulty concentrating, remembering, or making decisions?: No Patient able to express need for assistance with ADLs?: Yes Does the patient have difficulty dressing or bathing?: No Independently performs ADLs?: Yes (appropriate for developmental age) Does the patient have difficulty walking or climbing stairs?: Yes Weakness of Legs: None Weakness of Arms/Hands: None  Permission Sought/Granted   Permission granted to share information with : Yes, Verbal Permission Granted  Share Information with NAME: [name]     Permission granted to share info w Relationship: son     Emotional Assessment Appearance:: Appears stated age Attitude/Demeanor/Rapport: Engaged Affect (typically observed): Accepting Orientation: : Fluctuating Orientation (Suspected and/or reported Sundowners)   Psych Involvement: No (comment)  Admission diagnosis:  hypotensive Patient Active Problem List   Diagnosis Date Noted  . Nausea & vomiting 06/24/2018  . Hypotension due to hypovolemia 06/24/2018  . Leukopenia 06/24/2018  . History of ischemic cardiomyopathy 06/24/2018  . Hyponatremia 06/24/2018  . Type 2 diabetes mellitus ([place]) 04/07/2017  . Mild dementia ([facility] [facility]) 07/26/2016  . S/P CABG x 4   . Coronary artery disease 11/16/2015  .  Medication management 07/27/2013  . Vitamin D deficiency 02/01/2013  . GERD (gastroesophageal reflux disease)   . Hypothyroidism   . CKD stage 2 due to type 2 diabetes mellitus ([place]) 06/04/2007  . Hyperlipidemia 06/04/2007  . BMI 24.0-24.9, adult 06/04/2007  . Essential hypertension 06/04/2007   PCP:  [name], MD Pharmacy:   [id number] [place], [place] - [name] [place] [address] Phone: [phone] Fax: [phone]  [facility] [place], [place] [address] Phone: [phone] Fax: [phone]  [facility] [place], [place] [address] [place] [address] Phone: [phone] Fax: [phone]  [id number] [place], [place] [address] [place] [address] Phone: [phone] Fax: [facility] [address], [place] [address] [place] [address] Phone: [phone] Fax: [phone]     Social Determinants of Health (SDOH) Interventions    Readmission Risk Interventions No flowsheet data found.

## 2018-06-30 NOTE — Progress Notes (Addendum)
Occupational Therapy Treatment Patient Details Name: [name] [name] MRN: [id number] DOB: 19-Aug-1931 Today's Date: 06/30/2018    History of present illness [name] [name] is a 85 y.o. female with medical history significant for dementia, coronary artery disease, ischemic cardiomyopathy, hypothyroidism, and type 2 diabetes mellitus, now presenting to emergency department for evaluation of nausea, vomiting, and low blood pressure.   OT comments  Pt presents supine in bed, somewhat sleepy but is agreeable to therapy session. Pt engaged in grooming ADL (x2 tasks) seated EOB with setup/supervision assist. Pt completing lateral steps along EOB with minguard assist during session prior to return to supine. Pt initially endorses dizziness sitting up EOB, with symptoms subsiding within approx 1-2 min. Continue to recommend HHOT at time of discharge to maximize pt safety and independence with ADL and mobility. Will continue to follow acutely.   Follow Up Recommendations  Supervision/Assistance - 24 hour;Home health OT    Equipment Recommendations  3 in 1 bedside commode          Precautions / Restrictions Precautions Precautions: Fall;Other (comment) Precaution Comments: monitor BP Restrictions Weight Bearing Restrictions: No       Mobility Bed Mobility Overal bed mobility: Needs Assistance Bed Mobility: Supine to Sit;Sit to Supine     Supine to sit: Min guard Sit to supine: Supervision   General bed mobility comments: supervision for safety, HOB slightly elevated and use of rail  Transfers Overall transfer level: Needs assistance Equipment used: None Transfers: Sit to/from Stand Sit to Stand: Min guard         General transfer comment: for safety    Balance Overall balance assessment: Needs assistance   Sitting balance-Leahy Scale: Good       Standing balance-Leahy Scale: Fair                             ADL either performed or assessed with clinical  judgement   ADL Overall ADL's : Needs assistance/impaired     Grooming: Wash/dry face;Brushing hair;Set up;Supervision/safety;Sitting Grooming Details (indicate cue type and reason): seated EOB             Lower Body Dressing: Min guard;Sit to/from stand Lower Body Dressing Details (indicate cue type and reason): pt demonstrating figure 4 technique easily to adjust socks while seated EOB; minguard standing balance             Functional mobility during ADLs: Min guard General ADL Comments: pt performing ADL seated EOB today, initially endorses dizziness but with seated rest symptoms subside within approx 1-2 min, pt stood and side stepped along EOB prior to return to supine     Vision       Perception     Praxis      Cognition Arousal/Alertness: Awake/alert Behavior During Therapy: Flat affect Overall Cognitive Status: No family/caregiver present to determine baseline cognitive functioning                                 General Comments: h/o dementia        Exercises     Shoulder Instructions       General Comments      Pertinent Vitals/ Pain       Pain Assessment: No/denies pain  Home Living  Prior Functioning/Environment              Frequency  Min 2X/week        Progress Toward Goals  OT Goals(current goals can now be found in the care plan section)  Progress towards OT goals: Progressing toward goals  Acute Rehab OT Goals Patient Stated Goal: none stated, agreeable to working with therapy OT Goal Formulation: With patient Time For Goal Achievement: 07/10/18 Potential to Achieve Goals: Good ADL Goals Pt Will Transfer to Toilet: with supervision;ambulating;regular height toilet;bedside commode(vs) Pt Will Perform Toileting - Clothing Manipulation and hygiene: with supervision;sit to/from stand Additional ADL Goal #1: pt will complete adl with supervision and  set up  Plan Discharge plan remains appropriate    Co-evaluation                 AM-PAC OT "6 Clicks" Daily Activity     Outcome Measure   Help from another person eating meals?: A Little Help from another person taking care of personal grooming?: A Little Help from another person toileting, which includes using toliet, bedpan, or urinal?: A Little Help from another person bathing (including washing, rinsing, drying)?: A Little Help from another person to put on and taking off regular upper body clothing?: A Little Help from another person to put on and taking off regular lower body clothing?: A Little 6 Click Score: 18    End of Session    OT Visit Diagnosis: Muscle weakness (generalized) (M62.81)   Activity Tolerance Patient tolerated treatment well   Patient Left in bed;with bed alarm set;with call bell/phone within reach   Nurse Communication Mobility status        Time: 0076-2263 OT Time Calculation (min): 10 min  Charges: OT General Charges $OT Visit: 1 Visit OT Treatments $Self Care/Home Management : 8-22 mins  Lou Cal, OT Supplemental Rehabilitation Services Pager [phone] Office [phone]   Raymondo Band 06/30/2018, 4:28 PM

## 2018-07-01 DIAGNOSIS — F0390 Unspecified dementia without behavioral disturbance: Secondary | ICD-10-CM

## 2018-07-01 DIAGNOSIS — E039 Hypothyroidism, unspecified: Secondary | ICD-10-CM

## 2018-07-01 LAB — GLUCOSE, CAPILLARY
Glucose-Capillary: 109 mg/dL — ABNORMAL HIGH (ref 70–99)
Glucose-Capillary: 112 mg/dL — ABNORMAL HIGH (ref 70–99)
Glucose-Capillary: 107 mg/dL — ABNORMAL HIGH (ref 70–99)
Glucose-Capillary: 121 mg/dL — ABNORMAL HIGH (ref 70–99)
Glucose-Capillary: 127 mg/dL — ABNORMAL HIGH (ref 70–99)

## 2018-07-01 LAB — CBC
HCT: 27.2 % — ABNORMAL LOW (ref 36.0–46.0)
Hemoglobin: 8.7 g/dL — ABNORMAL LOW (ref 12.0–15.0)
MCH: 30.9 pg (ref 26.0–34.0)
MCHC: 32 g/dL (ref 30.0–36.0)
MCV: 96.5 fL (ref 80.0–100.0)
Platelets: 159 10*3/uL (ref 150–400)
RBC: 2.82 MIL/uL — ABNORMAL LOW (ref 3.87–5.11)
RDW: 14.5 % (ref 11.5–15.5)
WBC: 3.6 10*3/uL — ABNORMAL LOW (ref 4.0–10.5)
nRBC: 0 % (ref 0.0–0.2)

## 2018-07-01 LAB — BASIC METABOLIC PANEL
Anion gap: 4 — ABNORMAL LOW (ref 5–15)
BUN: <5 mg/dL — ABNORMAL LOW (ref 8–23)
CO2: 24 mmol/L (ref 22–32)
Calcium: 8.3 mg/dL — ABNORMAL LOW (ref 8.9–10.3)
Chloride: 110 mmol/L (ref 98–111)
Creatinine, Ser: 0.55 mg/dL (ref 0.44–1.00)
GFR calc Af Amer: >60 mL/min (ref >60)
GFR calc non Af Amer: >60 mL/min (ref >60)
Glucose, Bld: 105 mg/dL — ABNORMAL HIGH (ref 70–99)
Potassium: 4 mmol/L (ref 3.5–5.1)
Sodium: 138 mmol/L (ref 135–145)

## 2018-07-01 MED ORDER — SENNA 8.6 MG PO TABS: 1.0000 | ORAL_TABLET | Freq: Every day | ORAL | 0 refills | Status: DC

## 2018-07-01 MED ORDER — FOLIC ACID 1 MG PO TABS: 1.0000 mg | ORAL_TABLET | Freq: Every day | ORAL | 0 refills | Status: DC

## 2018-07-01 MED ORDER — METOCLOPRAMIDE HCL 5 MG PO TABS: 5.0000 mg | ORAL_TABLET | Freq: Three times a day (TID) | ORAL | 0 refills | Status: DC

## 2018-07-01 NOTE — Progress Notes (Signed)
Physical Therapy Treatment Patient Details Name: [name] [name] MRN: [id number] DOB: 12/19/1931 Today's Date: 07/01/2018    History of Present Illness [name] [name] is a 85 y.o. female with medical history significant for dementia, coronary artery disease, ischemic cardiomyopathy, hypothyroidism, and type 2 diabetes mellitus, now presenting to emergency department for evaluation of nausea, vomiting, and low blood pressure.    PT Comments    Pt presents with h/o dementia contributing to her fall risk. Pt does have some decreased balance ambulating without an assistive device. Unclear if pt will agree or remember to use the device. Pt is min assist with no AD on the unit. Recommend home with Supervision for mobility. Pt will continue to benefit from skilled PT to maximize mobility and Independence.   Follow Up Recommendations  Home health PT;Supervision/Assistance - 24 hour     Equipment Recommendations  Rolling walker with 5" wheels    Recommendations for Other Services       Precautions / Restrictions Precautions Precautions: Fall Restrictions Weight Bearing Restrictions: No    Mobility  Bed Mobility Overal bed mobility: Modified Independent Bed Mobility: Supine to Sit     Supine to sit: Modified independent (Device/Increase time) Sit to supine: Modified independent (Device/Increase time)   General bed mobility comments: used bed rail  Transfers Overall transfer level: Needs assistance Equipment used: Rolling walker (2 wheeled);None Transfers: Sit to/from [employer] to Stand: Min guard Stand pivot transfers: Min guard       General transfer comment: cues for hand placement, pt min guard due to unsteady.  Ambulation/Gait Ambulation/Gait assistance: Min assist;Min guard;+2 safety/equipment Gait Distance (Feet): 200 Feet Assistive device: Rolling walker (2 wheeled);None Gait Pattern/deviations: Step-through pattern;Decreased stride length      General Gait Details: no c/o dizziness. Pt ambulated x2. pt min guard with RW, min assist with no AD. Pt tends to have a forward leaning during gait contributing to possible fall forward. Pt did also have a LOB in the bathroom with using the RW requiring therapy steady assist.   Stairs             Wheelchair Mobility    Modified Rankin (Stroke Patients Only)       Balance Overall balance assessment: Needs assistance   Sitting balance-Leahy Scale: Good     Standing balance support: No upper extremity supported Standing balance-Leahy Scale: Fair Standing balance comment: min assist with minimal challanges to balance,               High Level Balance Comments: min assist to maintain dynamic balance without RW with head turns, sudden stops, backing uo            Cognition Arousal/Alertness: Awake/alert Behavior During Therapy: WFL for tasks assessed/performed Overall Cognitive Status: No family/caregiver present to determine baseline cognitive functioning                                 General Comments: h/o dementia      Exercises      General Comments General comments (skin integrity, edema, etc.): No c/o dizziness.      Pertinent Vitals/Pain Pain Assessment: No/denies pain    Home Living                      Prior Function            PT Goals (current goals can now be found  in the care plan section) Progress towards PT goals: Progressing toward goals    Frequency    Min 3X/week      PT Plan Current plan remains appropriate    Co-evaluation              AM-PAC PT "6 Clicks" Mobility   Outcome Measure  Help needed turning from your back to your side while in a flat bed without using bedrails?: None Help needed moving from lying on your back to sitting on the side of a flat bed without using bedrails?: None Help needed moving to and from a bed to a chair (including a wheelchair)?: A Little Help needed  standing up from a chair using your arms (e.g., wheelchair or bedside chair)?: A Little Help needed to walk in hospital room?: A Little Help needed climbing 3-5 steps with a railing? : A Little 6 Click Score: 20    End of Session Equipment Utilized During Treatment: Gait belt Activity Tolerance: Patient tolerated treatment well Patient left: in bed;with bed alarm set;with call bell/phone within reach Nurse Communication: Mobility status PT Visit Diagnosis: Muscle weakness (generalized) (M62.81);Difficulty in walking, not elsewhere classified (R26.2)     Time: 2924-4628 PT Time Calculation (min) (ACUTE ONLY): 16 min  Charges:  $Gait Training: 8-22 mins                    Theodoro Grist, PT   Theodoro Grist [place] 07/01/2018, 11:18 AM

## 2018-07-01 NOTE — Progress Notes (Signed)
Pts IV removed with a clean and dry dressing intact. Pt denies pain at the time of d/c with no s/s of distress noted. Pts son [name] and pt were educated on d/c instructions, follow up appointments, and medications, all questions were answered at that time. Pt taken to main entrance for d/c.

## 2018-07-01 NOTE — Discharge Summary (Signed)
Discharge Summary  [name] [name] JIR:[id number] DOB: 01-06-1932  PCP: [name], MD  Admit date: 06/24/2018 Discharge date: 07/01/2018  Time spent: 35 mins  Recommendations for Outpatient Follow-up:  1. PCP in 1 week to repeat labs and follow up on her BP  Discharge Diagnoses:  Active Hospital Problems   Diagnosis Date Noted  . Hypotension due to hypovolemia 06/24/2018  . Nausea & vomiting 06/24/2018  . Leukopenia 06/24/2018  . History of ischemic cardiomyopathy 06/24/2018  . Hyponatremia 06/24/2018  . Type 2 diabetes mellitus ([facility]) 04/07/2017  . Mild dementia ([facility]) 07/26/2016  . Coronary artery disease 11/16/2015  . Hypothyroidism   . Essential hypertension 06/04/2007    Resolved Hospital Problems  No resolved problems to display.    Discharge Condition: Stable  Diet recommendation: As tolerated  Vitals:   07/01/18 0358 07/01/18 1552  BP: (!) 100/51 (!) 113/51  Pulse: 65 63  Resp: 16   Temp: 98.6 F (37 C)   SpO2: 100%     History of present illness:  [name] [name] is a 85 y.o.femalewith medical history significant fordementia, coronary artery disease, ischemic cardiomyopathy, hypothyroidism, and type 2 diabetes mellitus, now presenting to emergency department for evaluation of nausea, vomiting, and low blood pressure. Patient has reportedly been experiencing nausea with nonbloody vomiting for the past 3 to 4 weeks, has been able to tolerate cola,but has vomited anytime she attempts todrink water oreat. She has not been taking her medications "for a long time" per report of her son. She was taken to see her PCP for evaluation of these complaints, was noted to have a blood pressure of 84/52, and was directed to the ED for further evaluation. She was given 250 cc of IV fluids with EMS prior to arrival in the ED.patient denies abdominal pain, diarrhea, fevers, or chills. She denies any dysphasia. KUB features a nonobstructive bowel gas pattern with  large amount of stool in the colon. Patient underwent RUQ [place] which was concerning for cholelithiasis with cholecystitis.  General surgery as well as cardiology were consulted for cardiac clearance. Patient underwent HIDA scan which was normal.  General surgery does not feel patient's gallbladder is her primary issue for symptoms.  Possibly gastroparesis.   Today, pt denies any new complaints. BP improving, No further N/V. Pt stable for d/c home with HHPT. Informed son about discharge plans.  Hospital Course:  Principal Problem:   Hypotension due to hypovolemia Active Problems:   Essential hypertension   Hypothyroidism   Coronary artery disease   Mild dementia (HCC)   Type 2 diabetes mellitus (HCC)   Nausea & vomiting   Leukopenia   History of ischemic cardiomyopathy   Hyponatremia   Poor oral intake/Intractable nausea and vomiting likely 2/2 ??gastroparesis Improved, no further N/V -KUB is negative for obstruction but notable for heavy colonic stool burden -RUQ US revealed cholelithiasis with gallbladder wall thickening concerning for acute cholecystitis -HIDA scan with normal gallbladder filling, normal EF -General surgery consulted and signed off 6/20 -Possibly secondary to gastroparesis from longstanding diabetes, would recommend gastric emptying study as an outpatient.  Started empiric Reglan.    Constipation Miralax and senna scheduled  Hypotension Improving -Patient presents from PCP office where she was seen for 3-4 wks of N/V and found to have SBP in low 80's.  This is likely secondary to hypovolemia in setting of persistent nausea and vomiting.  Blood pressure has improved after IV fluids -Continue to hold verapamil until seen by PCP -Encourage adequate p.o.  intake  Type II DM -Hemoglobin A1c 6.1  -Continue metformin   CAD; history of ischemic cardiomyopathy, s/p CABG  -Stable. Continue aspirin and Lipitor -Appreciate cardiology.  Echocardiogram with EF 60 to  65%, moderate thickening of aortic valve  Hyponatremia -Resolved with IV fluids  History of hypothyroidism -Free T4 1.25, TSH 0.288. Patient does not remember if she is on thyroid medications. Per chart review, it looks like it was discontinued back in 2015 PCP to repeat labs as outpatient in several weeks.   Dementia -Follows Neurology Dr. Delice Lesch. Slowly progressed over the past year    Dental pain -CT maxillofacial reveals periapical lucency right central incisor, no subperiosteal abscess. Follow up with primary dentist outpatient   Pyuria -Initially concerned about UTI, UA with moderate leukocytes and rare bacteria, 6-10 WBC. No complaints of dysuria. Stop rocephin.         Malnutrition Type:  Nutrition Problem: Inadequate oral intake Etiology: nausea, vomiting   Malnutrition Characteristics:  Signs/Symptoms: per patient/family report   Nutrition Interventions:  Interventions: Boost Breeze, Magic cup, MVI   Estimated body mass index is 22.29 kg/m as calculated from the following:   Height as of an earlier encounter on 06/24/18: 5' 1"  (1.549 m).   Weight as of this encounter: 53.5 kg.    Procedures:  None   Consultations:  General surgery  Cardiology  Discharge Exam: BP (!) 113/51 (BP Location: Right Arm)   Pulse 63   Temp 98.6 F (37 C)   Resp 16   Wt 53.5 kg   LMP  (LMP Unknown)   SpO2 100%   BMI 22.29 kg/m   General: NAD Cardiovascular: S1, S2 present Respiratory: CTAB  Discharge Instructions You were cared for by a hospitalist during your hospital stay. If you have any questions about your discharge medications or the care you received while you were in the hospital after you are discharged, you can call the unit and asked to speak with the hospitalist on call if the hospitalist that took care of you is not available. Once you are discharged, your primary care physician will handle any further medical issues. Please note that NO  REFILLS for any discharge medications will be authorized once you are discharged, as it is imperative that you return to your primary care physician (or establish a relationship with a primary care physician if you do not have one) for your aftercare needs so that they can reassess your need for medications and monitor your lab values.   Allergies as of 07/01/2018      Reactions   Bee Venom Anaphylaxis, Other (See Comments)   Eyes swell, cannot breathe   Persantine [dipyridamole] Other (See Comments)   "went crazy"   Actos [pioglitazone] Other (See Comments)   UNSPECIFIED REACTION       Medication List    STOP taking these medications   verapamil 240 MG CR tablet Commonly known as: CALAN-SR     TAKE these medications   aspirin 325 MG EC tablet Take 1 tablet (325 mg total) by mouth daily.   atorvastatin 80 MG tablet Commonly known as: LIPITOR TAKE 1 TABLET DAILY FOR CHOLESTEROL   blood glucose meter kit and supplies Kit Dispense based on patient and insurance preference. Check blood sugar 1 time [id number].   folic acid 1 MG tablet Commonly known as: FOLVITE Take 1 tablet (1 mg total) by mouth daily. Start taking on: July 02, 2018   metFORMIN 500 MG 24 hr tablet Commonly known as:  GLUCOPHAGE-XR TAKE 2 TABLETS BY MOUTH TWICE A DAY What changed:   how much to take  when to take this   metoCLOPramide 5 MG tablet Commonly known as: Reglan Take 1 tablet (5 mg total) by mouth 4 (four) times daily -  before meals and at bedtime for 30 days.   mirtazapine 15 MG tablet Commonly known as: REMERON TAKE 1 TABLET ONE HOUR BEFORE SLEEP What changed: See the new instructions.   nitroGLYCERIN 0.4 MG SL tablet Commonly known as: Nitrostat Dissolve 1 tablet under tongue every 3 to 5 minutes if needed for ChestPain   ondansetron 8 MG disintegrating tablet Commonly known as: Zofran ODT Dissolve 1 tablet under tongue every 6 hours for nausea  or vomitting   polyethylene  glycol 17 g packet Commonly known as: MIRALAX / GLYCOLAX Take 17 g by mouth daily. What changed:   when to take this  reasons to take this   senna 8.6 MG Tabs tablet Commonly known as: SENOKOT Take 1 tablet (8.6 mg total) by mouth at bedtime.      Allergies  Allergen Reactions  . Bee Venom Anaphylaxis and Other (See Comments)    Eyes swell, cannot breathe  . Persantine [Dipyridamole] Other (See Comments)    "went crazy"  . Actos [Pioglitazone] Other (See Comments)    UNSPECIFIED REACTION    Follow-up Information    Jerline Pain, MD Follow up.   Specialty: Cardiology Why: The office will call you to schedule an appointment for within the next 3 months to follow up for coronary artery disease.  Contact information: [address] [place] [address] [phone]        [name], MD. Schedule an appointment as soon as possible for a visit in 1 week(s).   Specialty: Internal Medicine Contact information: [address] [place] [address] [phone]            The results of significant diagnostics from this hospitalization (including imaging, microbiology, ancillary and laboratory) are listed below for reference.    Significant Diagnostic Studies: Nm Hepato W/eject Fract  Result Date: 06/26/2018 CLINICAL DATA:  [name] [name] is cyst is EXAM: NUCLEAR MEDICINE HEPATOBILIARY IMAGING WITH GALLBLADDER EF TECHNIQUE: Sequential images of the abdomen were obtained out to 60 minutes following intravenous administration of radiopharmaceutical. After oral ingestion of Ensure, gallbladder ejection fraction was determined. At 60 min, normal ejection fraction is greater than 33%. RADIOPHARMACEUTICALS:  5.5 mCi Tc-40m Choletec IV COMPARISON:  Ultrasound 06/25/2018 FINDINGS: Prompt uptake and biliary excretion of activity by the liver is seen. Gallbladder activity is visualized, consistent with patency of cystic duct. Biliary activity passes  into small bowel, consistent with patent common bile duct. Motion patient had difficulty remaining still during the ejection fraction phase. Calculated gallbladder ejection fraction is greater than 50%. (Normal gallbladder ejection fraction with Ensure is greater than 33%.) IMPRESSION: 1. Normal gallbladder filling. Patent cystic duct and common bile duct. 2. Normal ejection fraction. Electronically Signed   By: SSuzy BouchardM.D.   On: 06/26/2018 16:46   Ct Maxillofacial W Contrast  Result Date: 06/25/2018 CLINICAL DATA:  Dental pain. Evaluation for abscess. EXAM: CT MAXILLOFACIAL WITH CONTRAST TECHNIQUE: Multidetector CT imaging of the maxillofacial structures was performed with intravenous contrast. Multiplanar CT image reconstructions were also generated. CONTRAST:  745mOMNIPAQUE IOHEXOL 300 MG/ML  SOLN COMPARISON:  None. FINDINGS: Osseous: Absent dentition aside from residual mandibular incisor tooth fragments with periapical lucency associated with the right central incisor. No  evidence of associated subperiosteal abscess or significant overlying soft tissue swelling. No fracture. Orbits: Bilateral cataract extraction. Sinuses: Small right maxillary sinus mucous retention cyst. Clear mastoid air cells. Soft tissues: Extensive bilateral carotid artery calcific atherosclerosis with potentially severe proximal ICA stenoses bilaterally. Assessment is limited by non angiographic technique and the dense calcification limiting visualization of the residual patent lumen. Limited intracranial: Carotid siphon and vertebral artery atherosclerosis. IMPRESSION: 1. Periapical lucency associated with the right central incisor tooth fragment. No evidence of subperiosteal abscess. 2. Extensive atherosclerosis with potentially severe bilateral ICA stenoses. Electronically Signed   By: Logan Bores M.D.   On: 06/25/2018 17:05   Dg Abd Acute 2+v W 1v Chest  Result Date: 06/24/2018 CLINICAL DATA:  Hypotension. EXAM: DG  ABDOMEN ACUTE W/ 1V CHEST COMPARISON:  12/19/2015 FINDINGS: The patient is status post prior median sternotomy. The heart size is stable. Aortic calcifications are noted. Chronic lung changes are noted bilaterally without evidence of a focal infiltrate or pneumothorax. There is no large pleural effusion. No acute osseous abnormality. The bowel gas pattern is nonobstructive. There is a large amount of stool throughout the colon. Phleboliths project over the patient's pelvis. Degenerative changes are noted of the lumbar spine. No radiographic evidence for pneumatosis or free air. IMPRESSION: 1. No acute cardiopulmonary process. 2. Nonobstructive bowel gas pattern. 3. Large amount of stool in the colon. Electronically Signed   By: Constance Holster M.D.   On: 06/24/2018 19:24   Vas US Carotid  Result Date: 06/27/2018 Carotid Arterial Duplex Study Indications:      Bruit, Carotid artery disease, pre op for cholecystectomy and                   Previous carotid stenosis noted during pre CABG evaluation. Risk Factors:     Hypertension, hyperlipidemia, Diabetes, coronary artery                   disease. Other Factors:    Previous CABG 2017, CKD. Limitations:      Tortuosity Comparison Study: Available 11/08/ 2017 Performing Technologist: Toma Copier RVS  Examination Guidelines: A complete evaluation includes B-mode imaging, spectral Doppler, color Doppler, and power Doppler as needed of all accessible portions of each vessel. Bilateral testing is considered an integral part of a complete examination. Limited examinations for reoccurring indications may be performed as noted.  Right Carotid Findings: +----------+--------+--------+--------+---------------------+------------------+           PSV cm/sEDV cm/sStenosisDescribe             Comments           +----------+--------+--------+--------+---------------------+------------------+ CCA Prox  67      8                                    mild intimal  wall                                                         changes            +----------+--------+--------+--------+---------------------+------------------+ CCA Distal                        heterogenous  mild plaque        +----------+--------+--------+--------+---------------------+------------------+ ICA Prox  270     47      40-59%  heterogenous and     narrowing                                            irregular                               +----------+--------+--------+--------+---------------------+------------------+ ICA Mid   240     53      40-59%  heterogenous and     tortuous                                             irregular                               +----------+--------+--------+--------+---------------------+------------------+ ICA Distal92      17                                   tortuous           +----------+--------+--------+--------+---------------------+------------------+ ECA       191     8               heterogenous and     with acoustic                                        irregular            shadowing          +----------+--------+--------+--------+---------------------+------------------+ +----------+--------+-------+--------+-------------------+           PSV cm/sEDV cmsDescribeArm Pressure (mmHG) +----------+--------+-------+--------+-------------------+ Subclavian109                                        +----------+--------+-------+--------+-------------------+ +---------+--------+--+--------+--+---------+ VertebralPSV cm/s85EDV cm/s12Antegrade +---------+--------+--+--------+--+---------+ Stenosis appears to be in the higher end of 40% to 59% range or may be a lower end of 60% to 79% range. This shows no significant change from the exam of 2017  Left Carotid Findings: +----------+--------+--------+--------+---------------------+------------------+           PSV cm/sEDV  cm/sStenosisDescribe             Comments           +----------+--------+--------+--------+---------------------+------------------+ CCA Prox  71      3               heterogenous         mild intimal wall                                                         changes with mild  plaque on the near                                                        wall               +----------+--------+--------+--------+---------------------+------------------+ CCA Distal86                      heterogenous         mild plaque        +----------+--------+--------+--------+---------------------+------------------+ ICA Prox  85      23      1-39%   heterogenous,        moderate plaque                                      irregular and        noted with                                           calcific             narrowing.                                                                difficult to image +----------+--------+--------+--------+---------------------+------------------+ ICA Mid   61      14                                   tortuous           +----------+--------+--------+--------+---------------------+------------------+ ICA Distal65      17                                   tortuous           +----------+--------+--------+--------+---------------------+------------------+ ECA       102     5               heterogenous and     mild to moderate                                     irregular            plaque with                                                               acoustic shadowing +----------+--------+--------+--------+---------------------+------------------+ +----------+--------+--------+--------+-------------------+ SubclavianPSV cm/sEDV cm/sDescribeArm Pressure (mmHG) +----------+--------+--------+--------+-------------------+            107                                         +----------+--------+--------+--------+-------------------+ +---------+--------+--+--------+--+  VertebralPSV cm/s71EDV cm/s17 +---------+--------+--+--------+--+ Difficult to image the ICA due to tortuosity and a dilated ECA, There appears to be no significant change from the exam of 2017  Summary: Right Carotid: Velocities in the right ICA are consistent with a 40-59%                stenosis. See technical comments lised above. Left Carotid: Velocities in the left ICA are consistent with a 1-39% stenosis.               See technical comments listed above. Vertebrals:  Bilateral vertebral arteries demonstrate antegrade flow. Subclavians: Normal flow hemodynamics were seen in bilateral subclavian              arteries. *See table(s) above for measurements and observations.  Electronically signed by Monica Martinez MD on 06/27/2018 at 1:33:48 PM.    Final    US Abdomen Limited Ruq  Result Date: 06/25/2018 CLINICAL DATA:  Nausea and vomiting EXAM: ULTRASOUND ABDOMEN LIMITED RIGHT UPPER QUADRANT COMPARISON:  None. FINDINGS: Gallbladder: Cholelithiasis with gallbladder sludge. Gallbladder wall thickening measuring 5.6 mm. No pericholecystic fluid. Negative sonographic Murphy sign. Common bile duct: Diameter: 8.1 mm.  No choledocholithiasis. Liver: No focal lesion identified. Within normal limits in parenchymal echogenicity. Portal vein is patent on color Doppler imaging with normal direction of blood flow towards the liver. IMPRESSION: Cholelithiasis with gallbladder wall thickening concerning for acute cholecystitis. Electronically Signed   By: Kathreen Devoid   On: 06/25/2018 17:17    Microbiology: Recent Results (from the past 240 hour(s))  Urine culture     Status: Abnormal   Collection Time: 06/24/18  6:39 PM   Specimen: Urine, Random  Result Value Ref Range Status   Specimen Description   Final    URINE, RANDOM Performed at [facility] [address], [place], [place] [address]    Special Requests   Final    NONE Performed at [facility], [place] [address], [place], [place] [address]    Culture (A)  Final    <10,000 COLONIES/mL INSIGNIFICANT GROWTH Performed at [facility] [address], [place], [place] [address]    Report Status 06/26/2018 FINAL  Final  Culture, blood (Routine X 2) w Reflex to ID Panel     Status: Abnormal   Collection Time: 06/24/18  9:23 PM   Specimen: BLOOD LEFT HAND  Result Value Ref Range Status   Specimen Description   Final    BLOOD LEFT HAND Performed at [facility] [address], [place], [place] [address]    Special Requests   Final    BOTTLES DRAWN AEROBIC ONLY Blood Culture adequate volume Performed at [facility] [address], [place], [place] [address]    Culture  Setup Time   Final    AEROBIC BOTTLE ONLY GRAM POSITIVE RODS CRITICAL RESULT CALLED TO, READ BACK BY AND VERIFIED WITH: Shelda Jakes [facility] 06/26/18 1750 JDW    Culture (A)  Final    DIPHTHEROIDS(CORYNEBACTERIUM SPECIES) Standardized susceptibility testing for this organism is not available. Performed at [facility], [place] [address], [place], [place] [address]    Report Status 06/27/2018 FINAL  Final  SARS Coronavirus 2 (CEPHEID - Performed in [facility] hospital lab), Hosp Order     Status: None   Collection Time: 06/24/18  9:26 PM   Specimen: Nasopharyngeal Swab  Result Value Ref Range Status   SARS Coronavirus 2 NEGATIVE NEGATIVE  Final    Comment: (NOTE) If result is NEGATIVE SARS-CoV-2 target nucleic acids are NOT DETECTED. The SARS-CoV-2 RNA is generally detectable in upper and lower  respiratory specimens during the acute phase of infection. The lowest  concentration of SARS-CoV-2 viral copies this assay can detect is 250  copies / mL. A negative result does not preclude SARS-CoV-2 infection  and should not be used as the  sole basis for treatment or other  patient management decisions.  A negative result may occur with  improper specimen collection / handling, submission of specimen other  than nasopharyngeal swab, presence of viral mutation(s) within the  areas targeted by this assay, and inadequate number of viral copies  (<250 copies / mL). A negative result must be combined with clinical  observations, patient history, and epidemiological information. If result is POSITIVE SARS-CoV-2 target nucleic acids are DETECTED. The SARS-CoV-2 RNA is generally detectable in upper and lower  respiratory specimens dur ing the acute phase of infection.  Positive  results are indicative of active infection with SARS-CoV-2.  Clinical  correlation with patient history and other diagnostic information is  necessary to determine patient infection status.  Positive results do  not rule out bacterial infection or co-infection with other viruses. If result is PRESUMPTIVE POSTIVE SARS-CoV-2 nucleic acids MAY BE PRESENT.   A presumptive positive result was obtained on the submitted specimen  and confirmed on repeat testing.  While 2019 novel coronavirus  (SARS-CoV-2) nucleic acids may be present in the submitted sample  additional confirmatory testing may be necessary for epidemiological  and / or clinical management purposes  to differentiate between  SARS-CoV-2 and other Sarbecovirus currently known to infect humans.  If clinically indicated additional testing with an alternate test  methodology [phone]) is advised. The SARS-CoV-2 RNA is generally  detectable in upper and lower respiratory sp ecimens during the acute  phase of infection. The expected result is Negative. Fact Sheet for Patients:  StrictlyIdeas.no Fact Sheet for Healthcare Providers: BankingDealers.co.za This test is not yet approved or cleared by the [place] FDA and has been authorized for detection  and/or diagnosis of SARS-CoV-2 by FDA under an Emergency Use Authorization (EUA).  This EUA will remain in effect (meaning this test can be used) for the duration of the COVID-19 declaration under Section 564(b)(1) of the Act, 21 U.S.C. section 360bbb-3(b)(1), unless the authorization is terminated or revoked sooner. Performed at [facility], [place] [address], [place], [place] [address]   Culture, blood (Routine X 2) w Reflex to ID Panel     Status: None   Collection Time: 06/24/18 10:34 PM   Specimen: BLOOD LEFT HAND  Result Value Ref Range Status   Specimen Description   Final    BLOOD LEFT HAND Performed at [facility] [address], [place], [place] [address]    Special Requests   Final    BOTTLES DRAWN AEROBIC AND ANAEROBIC Blood Culture adequate volume Performed at [facility] [address], [place], [place] [address]    Culture   Final    NO GROWTH 5 DAYS Performed at [facility], [place] [address], [place], [place] [address]    Report Status 06/29/2018 FINAL  Final     Labs: Basic Metabolic Panel: Recent Labs  Lab 06/27/18 0504 06/28/18 0552 06/29/18 0539 06/30/18 0724 07/01/18 0553  NA 138 138 137 136 138  K 3.2* 2.9* 3.9 4.0 4.0  CL 105 105 109 108  110  CO2 23 25 21* 21* 24  GLUCOSE 127* 150* 132* 131* 105*  BUN 8 <5* <5* <5* <5*  CREATININE 0.60 0.62 0.58 0.51 0.55  CALCIUM 8.3* 8.0* 8.0* 7.9* 8.3*  MG  --  1.4* 1.6*  --   --    Liver Function Tests: Recent Labs  Lab 06/24/18 1739 06/25/18 0526 06/26/18 0520 06/27/18 0504  AST 16 13* 14* 12*  ALT 9 9 9 9   ALKPHOS 33* 31* 31* 30*  BILITOT 1.0 1.0 1.3* 0.7  PROT 7.1 6.4* 6.2* 5.9*  ALBUMIN 3.9 3.5 3.4* 3.2*   Recent Labs  Lab 06/24/18 1739  LIPASE 70*   No results for input(s): AMMONIA in the last 168 hours. CBC: Recent Labs  Lab 06/24/18 1739 06/25/18 0526  06/27/18 0504 06/28/18 0552 06/29/18 0539 06/30/18 0724  07/01/18 0553  WBC 3.6* 3.3*   < > 4.4 3.5* 5.8 3.4* 3.6*  NEUTROABS 1.6* 1.3*  --   --   --   --   --   --   HGB 11.0* 9.3*   < > 8.9* 8.5* 8.6* 8.0* 8.7*  HCT 32.6* 27.9*   < > 27.3* 25.9* 27.2* 25.1* 27.2*  MCV 90.8 92.1   < > 92.9 93.2 94.8 95.8 96.5  PLT 182 159   < > 151 150 156 141* 159   < > = values in this interval not displayed.   Cardiac Enzymes: No results for input(s): CKTOTAL, CKMB, CKMBINDEX, TROPONINI in the last 168 hours. BNP: BNP (last 3 results) No results for input(s): BNP in the last 8760 hours.  ProBNP (last 3 results) No results for input(s): PROBNP in the last 8760 hours.  CBG: Recent Labs  Lab 06/30/18 2342 07/01/18 0355 07/01/18 0730 07/01/18 1132 07/01/18 1529  GLUCAP 109* 112* 107* 121* 127*       Signed:  Alma Friendly, MD [facility] 07/01/2018, 4:36 PM

## 2018-07-02 ENCOUNTER — Telehealth: Payer: Self-pay | Admitting: *Deleted

## 2018-07-02 NOTE — Telephone Encounter (Signed)
Called patient on 07/02/2018 , 11:50 AM in an attempt to reach the patient for a hospital follow up. The patient's son, [name], supplied the information, due to the patient being hard of hearing and has memory issues.  Admit date: 06/24/18 Discharge: 07/01/18   She does not have any questions or concerns about medications from the hospital admission. The patient's medications were reviewed over the phone, they were counseled to bring in all current medications to the hospital follow up visit. Patient has an appointment with Rayford Halsted on 07/08/2018.  I advised the patient to call if any questions or concerns arise about the hospital admission or medications    Home health was started in the hospital.  All questions were answered and a follow up appointment was made. Per the son, the HHPT has not contacted them, as yet.  Prior to Admission medications   Medication Sig Start Date End Date Taking? Authorizing Provider  aspirin EC 325 MG EC tablet Take 1 tablet (325 mg total) by mouth daily. 11/25/15   Barrett, Erin R, PA-C  atorvastatin (LIPITOR) 80 MG tablet TAKE 1 TABLET DAILY FOR CHOLESTEROL 08/22/17   Liane Comber, NP  blood glucose meter kit and supplies KIT Dispense based on patient and insurance preference. Check blood sugar 1 time daily-DX-E11.22. 12/08/15   [name], MD  folic acid (FOLVITE) 1 MG tablet Take 1 tablet (1 mg total) by mouth daily. 07/02/18   Alma Friendly, MD  metFORMIN (GLUCOPHAGE-XR) 500 MG 24 hr tablet TAKE 2 TABLETS BY MOUTH TWICE A DAY Patient taking differently: Take 500 mg by mouth 2 (two) times a day.  12/23/16   Liane Comber, NP  metoCLOPramide (REGLAN) 5 MG tablet Take 1 tablet (5 mg total) by mouth 4 (four) times daily -  before meals and at bedtime for 30 days. 07/01/18 07/31/18  Alma Friendly, MD  mirtazapine (REMERON) 15 MG tablet TAKE 1 TABLET ONE HOUR BEFORE SLEEP Patient taking differently: Take 15 mg by mouth at bedtime. TAKE 1  TABLET ONE HOUR BEFORE SLEEP 06/12/18   Liane Comber, NP  nitroGLYCERIN (NITROSTAT) 0.4 MG SL tablet Dissolve 1 tablet under tongue every 3 to 5 minutes if needed for ChestPain 10/31/15 01/27/17  [name], MD  ondansetron (ZOFRAN ODT) 8 MG disintegrating tablet Dissolve 1 tablet under tongue every 6 hours for nausea  or vomitting 06/23/18   [name], MD  polyethylene glycol [facility] / Floria Raveling) packet Take 17 g by mouth daily. Patient taking differently: Take 17 g by mouth daily as needed for moderate constipation.  10/28/17   Vicie Mutters, PA-C  senna (SENOKOT) 8.6 MG TABS tablet Take 1 tablet (8.6 mg total) by mouth at bedtime. 07/01/18   Alma Friendly, MD

## 2018-07-07 DIAGNOSIS — D649 Anemia, unspecified: Secondary | ICD-10-CM | POA: Insufficient documentation

## 2018-07-07 NOTE — Progress Notes (Signed)
Hospital follow up  Assessment and Plan: Hospital visit follow up for :   [name] [name] was seen today for hospitalization follow-up.  Diagnoses and all orders for this visit:  Hypotension due to hypovolemia Hydration is ongoing issue r/t dementia; discussed strategies with son Will set out water bottles for her to finish daily (3-4) and call to follow up on progress  Essential hypertension Continue to hold medications; remains hypotensive likely r/t dehydration/lack of oral fluid intake Monitor blood pressure at home; call if consistently over 140/90, or if remains consistently <110/60 Continue DASH diet.   Reminder to go to the ER if any CP, SOB, nausea, dizziness, severe HA, changes vision/speech, left arm numbness and tingling and jaw pain.  Intractable vomiting with nausea, unspecified vomiting type Resolved; nausea improved with reglan and zofran PRN Constipation and likely gastroparesis; defer on gastric emptying study for now after discussion but can revisit if needed  Type 2 diabetes mellitus with stage 2 chronic kidney disease, without long-term current use of insulin (HCC) Continue metformin; recent A1C excellently controlled; follow up 3 months Continue diet/exercise and close monitoring  Acquired hypothyroidism Recently intermittently hyperthyroid; has been off of medications for many years Monitor; no sx at this time; recheck at follow up OV  CKD stage 2 due to type 2 diabetes mellitus (HCC) Increase fluids (3-4 bottles daily), avoid NSAIDS, monitor sugars, will monitor -     COMPLETE METABOLIC PANEL WITH GFR  Hyponatremia Resolved at discharge; recheck -     COMPLETE METABOLIC PANEL WITH GFR  Leukopenia, unspecified type Monitor  -     CBC with Differential/Platelet  Anemia due to other cause, not classified Chronic with superimposed dilutional; B12 and iron were normal  -     CBC with Differential/Platelet  Other orders Stop miralax; poor compliance due to  powder format; start tablet daily PRN constipation Keep a bowel habits calendar to monitor  -     bisacodyl (DULCOLAX) 5 MG EC tablet; Take 1 tablet (5 mg total) by mouth daily as needed for moderate constipation.   All medications were reviewed with patient and family and fully reconciled. All questions answered fully, and patient and family members were encouraged to call the office with any further questions or concerns. Discussed goal to avoid readmission related to this diagnosis.  Medications Discontinued During This Encounter  Medication Reason  . polyethylene glycol (MIRALAX / GLYCOLAX) packet Patient Preference    Over 40 minutes of exam, counseling, chart review, and complex, high/moderate level critical decision making was performed this visit.   Future Appointments  Date Time Provider [place]  09/22/2018  2:20 PM Jerline Pain, MD [facility]    HPI 85 y.o.female presents for follow up for transition from recent hospitalization or SNIF stay. Admit date to the hospital was 06/24/18, patient was discharged from the hospital on 07/01/18 and our clinical staff contacted the office the day after discharge to set up a follow up appointment. The discharge summary, medications, and diagnostic test results were reviewed before meeting with the patient. The patient was admitted for:   Admit date: 06/24/2018 Discharge date: 07/01/2018  Time spent: 35 mins  Recommendations for Outpatient Follow-up:  1. PCP in 1 week to repeat labs and follow up on her BP  History of present illness:  [name] Bodiford Summersis a 85 y.o.femalewith medical history significant fordementia, coronary artery disease, ischemic cardiomyopathy, hypothyroidism, and type 2 diabetes mellitus, presented to emergency department for evaluation of nausea, vomiting, and low  blood pressure on 06/24/2018. Patient has reportedly been experiencing nausea with nonbloody vomiting for the previous 3 to 4 weeks,  had been able to tolerate cola,but has vomited anytime she attempts todrink water oreat. She had not been taking her medications "for a long time" per report of her son. She was evaluated by our office for reports of these complaints, was noted to have a blood pressure of 84/52, and was directed to the ED for further evaluation. She was given 250 cc of IV fluids with EMS prior to arrival in the ED.patient denied abdominal pain, diarrhea, fevers, or chills. She denied any dysphasia. KUB featured a nonobstructive bowel gas pattern with large amount of stool in the colon. Patient underwent RUQ [place] which was concerning for cholelithiasis with cholecystitis. General surgery as well as cardiology were consulted for cardiac clearance. Patient underwent HIDA scan which was normal. General surgery did not feel patient's gallbladder is her primary issue for symptoms. Possibly gastroparesis.  At discharge, pt denied any new complaints. BP improving, No further N/V. Pt determined to be stable for d/c home with HHPT. Son was informated about discharge plans.  Hospital Course:  Principal Problem:   Hypotension due to hypovolemia Active Problems:   Essential hypertension   Hypothyroidism   Coronary artery disease   Mild dementia (HCC)   Type 2 diabetes mellitus (HCC)   Nausea & vomiting   Leukopenia   History of ischemic cardiomyopathy   Hyponatremia   Poor oral intake/Intractable nausea and vomiting likely 2/2 ??gastroparesis Improved, no further N/V -KUB negative for obstruction but notable for heavy colonic stool burden -RUQ US revealed cholelithiasis with gallbladder wall thickening concerning for acute cholecystitis -HIDA scan with normal gallbladder filling, normal EF -General surgery consulted and signed off 6/20 -Possibly secondary to gastroparesis from longstanding diabetes, was recommended gastric emptying study as an outpatient. Was started on empiric Reglan.    Constipation Discharged on Miralax and senna - miralax compliance is difficult due to powder vs tablet - requests change if possible   Hypotension -Patient was sent from our office where she was seen for 3-4 wks of N/V and found to have SBP in low 80's. Hypotension is likely secondary to hypovolemia in setting of persistent nausea and vomiting. Blood pressure has improved after IV fluids -She was advised to continue to hold verapamil until seen by PCP - based on today's values will continue to hold  -Encourage adequate p.o. intake  Today their BP is BP: (!) 128/56, BPs at home have been low <110/50s, improved in office after she drank a bottle of water She denies chest pain, shortness of breath, dizziness.  Type II DM -Hemoglobin A1c 6.1  -Continue metformin  Lab Results  Component Value Date   HGBA1C 6.1 (H) 06/25/2018    CAD; history of ischemic cardiomyopathy, s/p CABG  -Stable. Continue aspirin and Lipitor -Cardiology evaluated inpatient. Echocardiogram with EF 60 to 65%, moderate thickening of aortic valve -Continue routine outpatient follow up as recommended  Hyponatremia -Resolved with IV fluids  History of hypothyroidism -Free T4 1.25, TSH 0.288. Patient does not remember if she is on thyroid medications. Per chart review, it looks like it was discontinued back in 2015. Son agrees with this today.  Discharge recommendations for PCP to repeat labs as outpatient in several weeks. On review she has been intermittently mildly hyperthyroid without notable symptoms and has been monitored only.   Lab Results  Component Value Date   TSH 0.288 (L) 06/24/2018  Dementia -Follows Neurology Dr. Delice Lesch. Slowly progressed over the past year   Dental pain -CT maxillofacial reveals periapical lucency right central incisor, no subperiosteal abscess. Was advised to follow up with primary dentist outpatient.   Pyuria -Initially concerned about UTI, UA with moderate  leukocytes and rare bacteria, 6-10 WBC. No complaints of dysuria. Stopped rocephin.    Component     Latest Ref Rng & Units 07/01/2018  Sodium     135 - 145 mmol/L 138  Potassium     3.5 - 5.1 mmol/L 4.0  Chloride     98 - 111 mmol/L 110  CO2     22 - 32 mmol/L 24  Glucose     70 - 99 mg/dL 105 (H)  BUN     8 - 23 mg/dL <5 (L)  Creatinine     0.44 - 1.00 mg/dL 0.55  Calcium     8.9 - 10.3 mg/dL 8.3 (L)  GFR, Est Non African American     >60 mL/min >60  GFR, Est African American     >60 mL/min >60  Anion gap     5 - 15 4 (L)  WBC     4.0 - 10.5 K/uL 3.6 (L)  RBC     3.87 - 5.11 MIL/uL 2.82 (L)  Hemoglobin     12.0 - 15.0 g/dL 8.7 (L)  HCT     36.0 - 46.0 % 27.2 (L)  MCV     80.0 - 100.0 fL 96.5  MCH     26.0 - 34.0 pg 30.9  MCHC     30.0 - 36.0 g/dL 32.0  RDW     11.5 - 15.5 % 14.5  Platelets     150 - 400 K/uL 159  nRBC     0.0 - 0.2 % 0.0    On review she has had chronic anemia with hgb typically in low 11 g/dL range, acutely declined in 8-9 g/dL range while admitted, but suspect dilutional effect -    Lab Results  Component Value Date   IRON 45 06/24/2018   TIBC 198 (L) 06/24/2018   FERRITIN 244 06/24/2018   Lab Results  Component Value Date   VITAMINB12 300 06/24/2018     She is here with her son today. She is living with her husband, family also calls regularly and stops in 5-6 days a week to check on her and remind her to take medications, will forget if not reminded. In the last 5 days anticipates has missed 2-3 doses. Patient can't remember much, but family feels she is eating better since addition of reglan. They are unsure of her hydration or BM routine due to her dementia and husband with visual impairment. Discussed strategies.   She is not taking miralax due to powder format and son puts medications in pill box for her. Alternates discussed today.   BMI is Body mass index is 21.16 kg/m. Wt Readings from Last 3 Encounters:  07/08/18 112  lb (50.8 kg)  06/30/18 117 lb 15.1 oz (53.5 kg)  06/24/18 109 lb 6.4 oz (49.6 kg)     Home health is not involved.   Images while in the hospital: Ct Maxillofacial W Contrast  Result Date: 06/25/2018 CLINICAL DATA:  Dental pain. Evaluation for abscess. EXAM: CT MAXILLOFACIAL WITH CONTRAST TECHNIQUE: Multidetector CT imaging of the maxillofacial structures was performed with intravenous contrast. Multiplanar CT image reconstructions were also generated. CONTRAST:  93m OMNIPAQUE IOHEXOL 300 MG/ML  SOLN COMPARISON:  None. FINDINGS: Osseous: Absent dentition aside from residual mandibular incisor tooth fragments with periapical lucency associated with the right central incisor. No evidence of associated subperiosteal abscess or significant overlying soft tissue swelling. No fracture. Orbits: Bilateral cataract extraction. Sinuses: Small right maxillary sinus mucous retention cyst. Clear mastoid air cells. Soft tissues: Extensive bilateral carotid artery calcific atherosclerosis with potentially severe proximal ICA stenoses bilaterally. Assessment is limited by non angiographic technique and the dense calcification limiting visualization of the residual patent lumen. Limited intracranial: Carotid siphon and vertebral artery atherosclerosis. IMPRESSION: 1. Periapical lucency associated with the right central incisor tooth fragment. No evidence of subperiosteal abscess. 2. Extensive atherosclerosis with potentially severe bilateral ICA stenoses. Electronically Signed   By: Logan Bores M.D.   On: 06/25/2018 17:05   Dg Abd Acute 2+v W 1v Chest  Result Date: 06/24/2018 CLINICAL DATA:  Hypotension. EXAM: DG ABDOMEN ACUTE W/ 1V CHEST COMPARISON:  12/19/2015 FINDINGS: The patient is status post prior median sternotomy. The heart size is stable. Aortic calcifications are noted. Chronic lung changes are noted bilaterally without evidence of a focal infiltrate or pneumothorax. There is no large pleural effusion. No  acute osseous abnormality. The bowel gas pattern is nonobstructive. There is a large amount of stool throughout the colon. Phleboliths project over the patient's pelvis. Degenerative changes are noted of the lumbar spine. No radiographic evidence for pneumatosis or free air. IMPRESSION: 1. No acute cardiopulmonary process. 2. Nonobstructive bowel gas pattern. 3. Large amount of stool in the colon. Electronically Signed   By: Constance Holster M.D.   On: 06/24/2018 19:24   US Abdomen Limited Ruq  Result Date: 06/25/2018 CLINICAL DATA:  Nausea and vomiting EXAM: ULTRASOUND ABDOMEN LIMITED RIGHT UPPER QUADRANT COMPARISON:  None. FINDINGS: Gallbladder: Cholelithiasis with gallbladder sludge. Gallbladder wall thickening measuring 5.6 mm. No pericholecystic fluid. Negative sonographic Murphy sign. Common bile duct: Diameter: 8.1 mm.  No choledocholithiasis. Liver: No focal lesion identified. Within normal limits in parenchymal echogenicity. Portal vein is patent on color Doppler imaging with normal direction of blood flow towards the liver. IMPRESSION: Cholelithiasis with gallbladder wall thickening concerning for acute cholecystitis. Electronically Signed   By: Kathreen Devoid   On: 06/25/2018 17:17     Current Outpatient Medications (Endocrine & Metabolic):  .  metFORMIN (GLUCOPHAGE-XR) 500 MG 24 hr tablet, TAKE 2 TABLETS BY MOUTH TWICE A DAY (Patient taking differently: Take 500 mg by mouth 2 (two) times a day. )  Current Outpatient Medications (Cardiovascular):  .  atorvastatin (LIPITOR) 80 MG tablet, TAKE 1 TABLET DAILY FOR CHOLESTEROL .  nitroGLYCERIN (NITROSTAT) 0.4 MG SL tablet, Dissolve 1 tablet under tongue every 3 to 5 minutes if needed for ChestPain   Current Outpatient Medications (Analgesics):  .  aspirin EC 325 MG EC tablet, Take 1 tablet (325 mg total) by mouth daily.  Current Outpatient Medications (Hematological):  .  folic acid (FOLVITE) 1 MG tablet, Take 1 tablet (1 mg total) by mouth  daily.  Current Outpatient Medications (Other):  .  blood glucose meter kit and supplies KIT, Dispense based on patient and insurance preference. Check blood sugar 1 time daily-DX-E11.22. .  metoCLOPramide (REGLAN) 5 MG tablet, Take 1 tablet (5 mg total) by mouth 4 (four) times daily -  before meals and at bedtime for 30 days. .  mirtazapine (REMERON) 15 MG tablet, TAKE 1 TABLET ONE HOUR BEFORE SLEEP (Patient taking differently: Take 15 mg by mouth at bedtime. TAKE 1 TABLET ONE HOUR BEFORE SLEEP) .  ondansetron (  ZOFRAN ODT) 8 MG disintegrating tablet, Dissolve 1 tablet under tongue every 6 hours for nausea  or vomitting (Patient taking differently: as needed. Dissolve 1 tablet under tongue every 6 hours for nausea  or vomitting) .  senna (SENOKOT) 8.6 MG TABS tablet, Take 1 tablet (8.6 mg total) by mouth at bedtime. .  bisacodyl (DULCOLAX) 5 MG EC tablet, Take 1 tablet (5 mg total) by mouth daily as needed for moderate constipation.  Past Medical History:  Diagnosis Date  . Anemia   . Anginal pain ([place])   . Cataract   . Colon polyp 2009    TUBULAR ADENOMA  . Coronary artery disease   . Depression   . Diabetes ([place])   . Diverticulosis of colon (without mention of hemorrhage) 2009  . Esophagitis 2009  . Gastritis 2009  . GERD (gastroesophageal reflux disease) 2009  . Hiatal hernia 2009  . Hyperlipemia   . Hypertension   . Thyroid disease    hypothyroid     Allergies  Allergen Reactions  . Bee Venom Anaphylaxis and Other (See Comments)    Eyes swell, cannot breathe  . Persantine [Dipyridamole] Other (See Comments)    "went crazy"  . Actos [Pioglitazone] Other (See Comments)    UNSPECIFIED REACTION     ROS: all negative except above.   Physical Exam: Filed Weights   07/08/18 1411  Weight: 112 lb (50.8 kg)   BP (!) 128/56   Pulse 78   Temp 97.7 F (36.5 C)   Ht 5' 1"  (1.549 m)   Wt 112 lb (50.8 kg)   LMP  (LMP Unknown)   SpO2 97%   BMI 21.16 kg/m  General  Appearance: Well nourished, in no apparent distress. Eyes: PERRLA, conjunctiva no swelling or erythema ENT/Mouth: Ext aud canals clear, TMs without erythema, bulging. No erythema, swelling, or exudate on post pharynx.  Tonsils not swollen or erythematous. Hearing normal.  Neck: Supple, thyroid normal.  Respiratory: Respiratory effort normal, BS equal bilaterally without rales, rhonchi, wheezing or stridor.  Cardio: RRR with 3/6 systolic murmur non-radiating. Symmetrical 1+ peripheral pulses without edema.  Abdomen: Soft, + BS.  Non tender, no guarding, rebound, hernias, masses. Lymphatics: Non tender without lymphadenopathy.  Musculoskeletal: Full ROM, no obvious deformity or effusion, symmetrical strength, slow steady gait.  Skin: Warm, dry without rashes, lesions, ecchymosis.  Neuro: Cranial nerves intact. Normal muscle tone, no cerebellar symptoms. Sensation intact.  Psych: Awake and oriented X 3, normal affect, Insight and Judgment fair    Izora Ribas, NP 3:17 PM [facility]

## 2018-07-08 ENCOUNTER — Other Ambulatory Visit: Payer: Self-pay

## 2018-07-08 ENCOUNTER — Ambulatory Visit (INDEPENDENT_AMBULATORY_CARE_PROVIDER_SITE_OTHER): Payer: Medicare Other | Admitting: Adult Health

## 2018-07-08 ENCOUNTER — Encounter: Payer: Self-pay | Admitting: Adult Health

## 2018-07-08 VITALS — BP 128/56 | HR 78 | Temp 97.7°F | Ht 61.0 in | Wt 112.0 lb

## 2018-07-08 DIAGNOSIS — E1122 Type 2 diabetes mellitus with diabetic chronic kidney disease: Secondary | ICD-10-CM | POA: Diagnosis not present

## 2018-07-08 DIAGNOSIS — E861 Hypovolemia: Secondary | ICD-10-CM

## 2018-07-08 DIAGNOSIS — I9589 Other hypotension: Secondary | ICD-10-CM

## 2018-07-08 DIAGNOSIS — E871 Hypo-osmolality and hyponatremia: Secondary | ICD-10-CM

## 2018-07-08 DIAGNOSIS — I10 Essential (primary) hypertension: Secondary | ICD-10-CM

## 2018-07-08 DIAGNOSIS — D6489 Other specified anemias: Secondary | ICD-10-CM

## 2018-07-08 DIAGNOSIS — D72819 Decreased white blood cell count, unspecified: Secondary | ICD-10-CM

## 2018-07-08 DIAGNOSIS — R112 Nausea with vomiting, unspecified: Secondary | ICD-10-CM

## 2018-07-08 DIAGNOSIS — K5900 Constipation, unspecified: Secondary | ICD-10-CM

## 2018-07-08 DIAGNOSIS — E039 Hypothyroidism, unspecified: Secondary | ICD-10-CM

## 2018-07-08 DIAGNOSIS — N182 Chronic kidney disease, stage 2 (mild): Secondary | ICD-10-CM

## 2018-07-08 MED ORDER — DULCOLAX 5 MG PO TBEC: 5.0000 mg | DELAYED_RELEASE_TABLET | Freq: Every day | ORAL | 2 refills | Status: DC | PRN

## 2018-07-08 NOTE — Patient Instructions (Addendum)
Goals    . Blood Pressure < 140/90     110/60+ ; monitor daily    . DIET - INCREASE WATER INTAKE     Set out bottles of water daily for patient to completed - work up to at least 3-4 bottles daily         Gastroparesis  Gastroparesis is a condition in which food takes longer than normal to empty from the stomach. The condition is usually long-lasting (chronic). It may also be called delayed gastric emptying. There is no cure, but there are treatments and things that you can do at home to help relieve symptoms. Treating the underlying condition that causes gastroparesis can also help relieve symptoms. What are the causes? In many cases, the cause of this condition is not known. Possible causes include:  A hormone (endocrine) disorder, such as hypothyroidism or diabetes.  A nervous system disease, such as Parkinson's disease or multiple sclerosis.  Cancer, infection, or surgery that affects the stomach or vagus nerve. The vagus nerve runs from your chest, through your neck, to the lower part of your brain.  A connective tissue disorder, such as scleroderma.  Certain medicines. What increases the risk? You are more likely to develop this condition if you:  Have certain disorders or diseases, including: ? An endocrine disorder. ? An eating disorder. ? Amyloidosis. ? Scleroderma. ? Parkinson's disease. ? Multiple sclerosis. ? Cancer or infection of the stomach or the vagus nerve.  Have had surgery on the stomach or vagus nerve.  Take certain medicines.  Are female. What are the signs or symptoms? Symptoms of this condition include:  Feeling full after eating very little.  Nausea.  Vomiting.  Heartburn.  Abdominal bloating.  Inconsistent blood sugar (glucose) levels on blood tests.  Lack of appetite.  Weight loss.  Acid from the stomach coming up into the esophagus (gastroesophageal reflux).  Sudden tightening (spasm) of the stomach, which can be painful.  Symptoms may come and go. Some people may not notice any symptoms. How is this diagnosed? This condition is diagnosed with tests, such as:  Tests that check how long it takes food to move through the stomach and intestines. These tests include: ? Upper gastrointestinal (GI) series. For this test, you drink a liquid that shows up well on X-rays, and then X-rays will be taken of your intestines. ? Gastric emptying scintigraphy. For this test, you eat food that contains a small amount of radioactive material, and then scans are taken. ? Wireless capsule GI monitoring system. For this test, you swallow a pill (capsule) that records information about how foods and fluid move through your stomach.  Gastric manometry. For this test, a tube is passed down your throat and into your stomach to measure electrical and muscular activity.  Endoscopy. For this test, a long, thin tube is passed down your throat and into your stomach to check for problems in your stomach lining.  Ultrasound. This test uses sound waves to create images of inside the body. This can help rule out gallbladder disease or pancreatitis as a cause of your symptoms. How is this treated? There is no cure for gastroparesis. Treatment may include:  Treating the underlying cause.  Managing your symptoms by making changes to your diet and exercise habits.  Taking medicines to control nausea and vomiting and to stimulate stomach muscles.  Getting food through a feeding tube in the hospital. This may be done in severe cases.  Having surgery to insert a device  into your body that helps improve stomach emptying and control nausea and vomiting (gastric neurostimulator). Follow these instructions at home:  Take over-the-counter and prescription medicines only as told by your health care provider.  Follow instructions from your health care provider about eating or drinking restrictions. Your health care provider may recommend that you: ?  Eat smaller meals more often. ? Eat low-fat foods. ? Eat low-fiber forms of high-fiber foods. For example, eat cooked vegetables instead of raw vegetables. ? Have only liquid foods instead of solid foods. Liquid foods are easier to digest.  Drink enough fluid to keep your urine pale yellow.  Exercise as often as told by your health care provider.  Keep all follow-up visits as told by your health care provider. This is important. Contact a health care provider if you:  Notice that your symptoms do not improve with treatment.  Have new symptoms. Get help right away if you:  Have severe abdominal pain that does not improve with treatment.  Have nausea that is severe or does not go away.  Cannot drink fluids without vomiting. Summary  Gastroparesis is a chronic condition in which food takes longer than normal to empty from the stomach.  Symptoms include nausea, vomiting, heartburn, abdominal bloating, and loss of appetite.  Eating smaller portions, and low-fat, low-fiber foods may help you manage your symptoms.  Get help right away if you have severe abdominal pain. This information is not intended to replace advice given to you by your health care provider. Make sure you discuss any questions you have with your health care provider. Document Released: 12/24/2004 Document Revised: 03/24/2017 Document Reviewed: 10/29/2016 Elsevier Patient Education  [name].    Bisacodyl tablets and capsules What is this medicine? BISACODYL (bis a KOE dill) is a laxative. This medicine is used to relieve constipation. It may also be used to empty and prepare the bowel for surgery or examination. This medicine may be used for other purposes; ask your health care provider or pharmacist if you have questions. COMMON BRAND NAME(S): Alophen, Bisac-Evac, Corrective Laxative for Women, Correctol, Dacodyl, Doxidan, Dulcolax, Ex-Lax Ultra, Feen-A-Mint, Fematrol, Femilax, Fleet, Reliable Gentle  Laxative, Veracolate What should I tell my health care provider before I take this medicine? They need to know if you have any of these conditions  appendicitis  persistent constipation  stomach pain or blockage  ulcerative colitis or other bowel disease  an unusual or allergic reaction to bisacodyl, other medicines, foods, dyes, or preservatives  pregnant or trying to get pregnant How should I use this medicine? Take this medicine by mouth with a glass of water. Follow the directions on the prescription label. Swallow the tablets whole. Do not crush or chew. Do not take this medicine more often than directed. Talk to your pediatrician regarding the use of this medicine in children. While this medicine may be used in children as young as 6 years for selected conditions, precautions do apply. Overdosage: If you think you have taken too much of this medicine contact a poison control center or emergency room at once. NOTE: This medicine is only for you. Do not share this medicine with others. What if I miss a dose? This does not apply. This medicine is not for regular use, and should only be used as needed. What may interact with this medicine?  antacids  h2-blockers like cimetidine, famotidine, nizatidine, and ranitidine  proton pump inhibitors like esomeprazole, omeprazole, pantoprazole, and rabeprazole This list may not describe all possible  interactions. Give your health care provider a list of all the medicines, herbs, non-prescription drugs, or dietary supplements you use. Also tell them if you smoke, drink alcohol, or use illegal drugs. Some items may interact with your medicine. What should I watch for while using this medicine? Do not use this medicine for longer than directed by your doctor or health care professional. This medicine can be habit-forming. Long-term use can make your body depend on the laxative for regular bowel movements, damage the bowel, cause malnutrition, and  problems with the amounts of water and salts in your body. If your constipation keeps returning, check with your doctor or health care professional. Do not take this medicine within 1 hour of taking antacids or eating dairy products like milk or yogurt. These items can destroy the protective coating on the tablets and increase stomach upset and cramps. If you do not have a bowel movement within 12 hours after using this medicine or you experience rectal bleeding, contact your doctor or health care professional. These may be signs of a more serious condition. What side effects may I notice from receiving this medicine? Side effects that you should report to your doctor or health care professional as soon as possible:  diarrhea  muscle weakness  nausea, vomiting  unusual weight loss Side effects that usually do not require medical attention (report to your doctor or health care professional if they continue or are bothersome):  bloating  discolored urine  lower stomach discomfort or cramps  rectal itching, burning, or swelling This list may not describe all possible side effects. Call your doctor for medical advice about side effects. You may report side effects to FDA at 1-800-FDA-1088. Where should I keep my medicine? Keep out of the reach of children. Store at room temperature below 25 degrees C (77 degrees F). Protect from moisture. Throw away any unused medicine after the expiration date. NOTE: This sheet is a summary. It may not cover all possible information. If you have questions about this medicine, talk to your doctor, pharmacist, or health care provider.  2020 Elsevier/Gold Standard (2007-03-26 12:55:59)     Hypotension As your heart beats, it forces blood through your body. Hypotension, commonly called low blood pressure, is when the force of blood pumping through your arteries is too weak. Arteries are blood vessels that carry blood from the heart throughout the body.  Depending on the cause and severity, hypotension may be harmless (benign) or may cause serious problems (be critical). When blood pressure is too low, you may not get enough blood to your brain or to the rest of your organs. This can cause weakness, light-headedness, rapid heartbeat, and fainting. What are the causes? This condition may be caused by:  Blood loss.  Loss of body fluids (dehydration).  Heart problems.  Hormone (endocrine) problems.  Pregnancy.  Severe infection.  Lack of certain nutrients.  Severe allergic reactions (anaphylaxis).  Certain medicines, such as blood pressure medicine or medicines that make the body lose excess fluids (diuretics). Sometimes, hypotension may be caused by not taking medicine as directed, such as taking too much of a certain medicine. What increases the risk? The following factors may make you more likely to develop this condition:  Age. Risk increases as you get older.  Conditions that affect the heart or the central nervous system.  Taking certain medicines, such as blood pressure medicine or diuretics.  Being pregnant. What are the signs or symptoms? Common symptoms of this condition include:  Weakness.  Light-headedness.  Dizziness.  Blurred vision.  Fatigue.  Rapid heartbeat.  Fainting, in severe cases. How is this diagnosed? This condition is diagnosed based on:  Your medical history.  Your symptoms.  Your blood pressure measurement. Your health care provider will check your blood pressure when you are: ? Lying down. ? Sitting. ? Standing. A blood pressure reading is recorded as two numbers, such as "120 over 80" (or 120/80). The first ("top") number is called the systolic pressure. It is a measure of the pressure in your arteries as your heart beats. The second ("bottom") number is called the diastolic pressure. It is a measure of the pressure in your arteries when your heart relaxes between beats. Blood  pressure is measured in a unit called mm Hg. Healthy blood pressure for most adults is 120/80. If your blood pressure is below 90/60, you may be diagnosed with hypotension. Other information or tests that may be used to diagnose hypotension include:  Your other vital signs, such as your heart rate and temperature.  Blood tests.  Tilt table test. For this test, you will be safely secured to a table that moves you from a lying position to an upright position. Your heart rhythm and blood pressure will be monitored during the test. How is this treated? Treatment for this condition may include:  Changing your diet. This may involve eating more salt (sodium) or drinking more water.  Taking medicines to raise your blood pressure.  Changing the dosage of certain medicines you are taking that might be lowering your blood pressure.  Wearing compression stockings. These stockings help to prevent blood clots and reduce swelling in your legs. In some cases, you may need to go to the hospital for:  Fluid replacement. This means you will receive fluids through an IV.  Blood replacement. This means you will receive donated blood through an IV (transfusion).  Treating an infection or heart problems, if this applies.  Monitoring. You may need to be monitored while medicines that you are taking wear off. Follow these instructions at home: Eating and drinking   Drink enough fluid to keep your urine pale yellow.  Eat a healthy diet, and follow instructions from your health care provider about eating or drinking restrictions. A healthy diet includes: ? Fresh fruits and vegetables. ? Whole grains. ? Lean meats. ? Low-fat dairy products.  Eat extra salt only as directed. Do not add extra salt to your diet unless your health care provider told you to do that.  Eat frequent, small meals.  Avoid standing up suddenly after eating. Medicines  Take over-the-counter and prescription medicines only as  told by your health care provider. ? Follow instructions from your health care provider about changing the dosage of your current medicines, if this applies. ? Do not stop or adjust any of your medicines on your own. General instructions   Wear compression stockings as told by your health care provider.  Get up slowly from lying down or sitting positions. This gives your blood pressure a chance to adjust.  Avoid hot showers and excessive heat as directed by your health care provider.  Return to your normal activities as told by your health care provider. Ask your health care provider what activities are safe for you.  Do not use any products that contain nicotine or tobacco, such as cigarettes, e-cigarettes, and chewing tobacco. If you need help quitting, ask your health care provider.  Keep all follow-up visits as told by your  health care provider. This is important. Contact a health care provider if you:  Vomit.  Have diarrhea.  Have a fever for more than 2-3 days.  Feel more thirsty than usual.  Feel weak and tired. Get help right away if you:  Have chest pain.  Have a fast or irregular heartbeat.  Develop numbness in any part of your body.  Cannot move your arms or your legs.  Have trouble speaking.  Become sweaty or feel light-headed.  Faint.  Feel short of breath.  Have trouble staying awake.  Feel confused. Summary  Hypotension is when the force of blood pumping through your arteries is too weak.  Hypotension may be harmless (benign) or may cause serious problems (be critical).  Treatment for this condition may include changing your diet, changing your medicines, and wearing compression stockings.  In some cases, you may need to go to the hospital for fluid or blood replacement. This information is not intended to replace advice given to you by your health care provider. Make sure you discuss any questions you have with your health care provider.  Document Released: 12/24/2004 Document Revised: 06/19/2017 Document Reviewed: 06/19/2017 Elsevier Patient Education  2020 [employer].

## 2018-07-09 ENCOUNTER — Other Ambulatory Visit: Payer: Self-pay | Admitting: Adult Health

## 2018-07-09 LAB — CBC WITH DIFFERENTIAL/PLATELET
Absolute Monocytes: 1107 cells/uL — ABNORMAL HIGH (ref 200–950)
Basophils Absolute: 10 cells/uL (ref 0–200)
Basophils Relative: 0.2 %
Eosinophils Absolute: 0 cells/uL — ABNORMAL LOW (ref 15–500)
Eosinophils Relative: 0 %
HCT: 27 % — ABNORMAL LOW (ref 35.0–45.0)
Hemoglobin: 8.9 g/dL — ABNORMAL LOW (ref 11.7–15.5)
Lymphs Abs: 1678 cells/uL (ref 850–3900)
MCH: 30.4 pg (ref 27.0–33.0)
MCHC: 33 g/dL (ref 32.0–36.0)
MCV: 92.2 fL (ref 80.0–100.0)
MPV: 10.3 fL (ref 7.5–12.5)
Monocytes Relative: 21.7 %
Neutro Abs: 2305 cells/uL (ref 1500–7800)
Neutrophils Relative %: 45.2 %
Platelets: 274 10*3/uL (ref 140–400)
RBC: 2.93 10*6/uL — ABNORMAL LOW (ref 3.80–5.10)
RDW: 15.7 % — ABNORMAL HIGH (ref 11.0–15.0)
Total Lymphocyte: 32.9 %
WBC: 5.1 10*3/uL (ref 3.8–10.8)

## 2018-07-09 LAB — COMPLETE METABOLIC PANEL WITH GFR
AG Ratio: 1.3 (calc) (ref 1.0–2.5)
ALT: 8 U/L (ref 6–29)
AST: 13 U/L (ref 10–35)
Albumin: 3.7 g/dL (ref 3.6–5.1)
Alkaline phosphatase (APISO): 43 U/L (ref 37–153)
BUN/Creatinine Ratio: 6 (calc) (ref 6–22)
BUN: 4 mg/dL — ABNORMAL LOW (ref 7–25)
CO2: 26 mmol/L (ref 20–32)
Calcium: 8.7 mg/dL (ref 8.6–10.4)
Chloride: 106 mmol/L (ref 98–110)
Creat: 0.72 mg/dL (ref 0.60–0.88)
GFR, Est African American: 89 mL/min/{1.73_m2} (ref >=60)
GFR, Est Non African American: 77 mL/min/{1.73_m2} (ref >=60)
Globulin: 2.8 g/dL (calc) (ref 1.9–3.7)
Glucose, Bld: 108 mg/dL — ABNORMAL HIGH (ref 65–99)
Potassium: 4.3 mmol/L (ref 3.5–5.3)
Sodium: 140 mmol/L (ref 135–146)
Total Bilirubin: 0.8 mg/dL (ref 0.2–1.2)
Total Protein: 6.5 g/dL (ref 6.1–8.1)

## 2018-08-02 ENCOUNTER — Other Ambulatory Visit: Payer: Self-pay | Admitting: Internal Medicine

## 2018-08-02 MED ORDER — SENNOSIDES 8.6 MG PO TABS: ORAL_TABLET | ORAL | 3 refills | Status: DC

## 2018-08-02 MED ORDER — METFORMIN HCL ER 500 MG PO TB24: ORAL_TABLET | ORAL | 3 refills | Status: DC

## 2018-08-02 MED ORDER — METOCLOPRAMIDE HCL 5 MG PO TABS: ORAL_TABLET | ORAL | 3 refills | Status: DC

## 2018-08-10 NOTE — Progress Notes (Signed)
MEDICARE ANNUAL WELLNESS VISIT AND FOLLOW UP  Assessment:   Diagnoses and all orders for this visit:  Encounter for Medicare annual wellness exam  Essential hypertension Continue medications Monitor blood pressure at home; call if consistently over 130/80 Continue DASH diet.   Reminder to go to the ER if any CP, SOB, nausea, dizziness, severe HA, changes vision/speech, left arm numbness and tingling and jaw pain.  Coronary artery disease involving coronary bypass graft with other forms of angina pectoris, unspecified whether native or transplanted heart (HCC) S/p CABG x 4, no further chest pain Control blood pressure, cholesterol, glucose, increase exercise.  Followed by cardiology  Gastroesophageal reflux disease, esophagitis presence not specified Well managed off of medications at this time - no problems in over a year Discussed diet, avoiding triggers and other lifestyle changes  Acquired hypothyroidism Currently borderline hyperthyroid off of medications with monitoring only Check TSH, refer as indicated for follow up if getting worse  Type 2 diabetes mellitus with stage 2 chronic kidney disease, without long-term current use of insulin [facility]) Education: Reviewed 'ABCs' of diabetes management (respective goals in parentheses):  A1C (<7), blood pressure (<130/80), and cholesterol (LDL <70) Eye Exam yearly (verified currently up to date) and Dental Exam every 6 months. Defer foot exam as just had at CPE last visit- Dietary recommendations Physical Activity recommendations  CKD stage 2 due to type 2 diabetes mellitus (HCC) Increase fluids, avoid NSAIDS, monitor sugars, will monitor CMP/GFR  Mild dementia Monitoring only; independent for most ADLs, driven by son/daughter in law with some supervision for medication management and bills Control blood pressure, cholesterol, glucose, emphasized need for increased exercise.  Followed by Dr. Delice Lesch  Vitamin D  deficiency Continue supplementation as tolerated for Check vitamin D level  S/P CABG x 4 Control blood pressure, cholesterol, glucose, increase exercise.   Overweight (BMI 25.0-29.9) Long discussion about weight loss, diet, and exercise Recommended diet heavy in fruits and veggies and low in animal meats, cheeses, and dairy products, appropriate calorie intake Discussed appropriate weight for height Follow up at next visit  Medication management CBC, CMP/GFR  Hyperlipidemia Continue medications: atorvastatin  Continue low cholesterol diet and exercise.  Check lipid panel.   Constipation Increase fluids - family to set out 3-4 bottles daily and visually verify she has completed daily Keep bowel calendar - DIL can reportedly check daily  Continue with senna, dulcolax - Increase fiber/ water intake, decrease caffeine, increase activity level Please go to the hospital if you have severe abdominal pain, vomiting, fever, CP, SOB.   Anemia Check CBC, continue folic acid supplement pending lab results, recommended she add a daily sublingual B12 for borderline low levels at recent check   Over 40 minutes of exam, counseling, chart review and critical decision making was performed Future Appointments  Date Time Provider [place]  09/22/2018  2:20 PM Jerline Pain, MD [facility]  11/11/2018  2:00 PM [name], MD [facility] None  08/25/2019  2:00 PM Liane Comber, NP [facility] None     Plan:   During the course of the visit the patient was educated and counseled about appropriate screening and preventive services including:    Pneumococcal vaccine   Prevnar 13  Influenza vaccine  Td vaccine  Screening electrocardiogram  Bone densitometry screening  Colorectal cancer screening  Diabetes screening  Glaucoma screening  Nutrition counseling   Advanced directives: requested   Subjective:  [name] [name] is a 85 y.o. female with hx  of MI with  4V CABG in 2017 who presents for Medicare Annual Wellness Visit and 1 month follow up on constipation, dehydration and anemia following recent hospitalization (n/v, unremarkable GI workup, ? Gastroparesis and gastric emptying was suggested but postponed at hospital follow up last visit, doing well with reglan).   She follows up with DIL instead of son today, unsure about meds/lifestyle changes or BMs that we intended to follow up with today. DIL reports patient's son fills pill box weekly and calls her daily to remind her to take medications but still may find she hasn't taken most days when visits on the weekend. Unsure about hydration status but per patient's husband she apparently seems to be eating and drinking better than prior to recent admission. Unsure about how often she is having BMs, didn't start calendar as discussed.   She has anemia that we are monitoring closely Lab Results  Component Value Date   WBC 5.1 07/08/2018   HGB 8.9 (L) 07/08/2018   HCT 27.0 (L) 07/08/2018   MCV 92.2 07/08/2018   PLT 274 07/08/2018   Lab Results  Component Value Date   IRON 45 06/24/2018   TIBC 198 (L) 06/24/2018   FERRITIN 244 06/24/2018   She is not currently on a B12 supplement despite borderline low result at recent check Lab Results  Component Value Date   VITAMINB12 300 73/71/0626   On folic acid supplement daily since recent hospitalization:  Lab Results  Component Value Date   FOLATE 3.3 (L) 06/24/2018      She has mild dementia followed by neurology Dr. Delice Lesch.   BMI is Body mass index is 21.16 kg/m., she has not been working on diet and exercise. Wt Readings from Last 3 Encounters:  08/11/18 112 lb (50.8 kg)  07/08/18 112 lb (50.8 kg)  06/30/18 117 lb 15.1 oz (53.5 kg)    Her blood pressure has been controlled at home, today their BP is BP: 102/60 She does not workout. She denies chest pain, shortness of breath, dizziness.   She is on cholesterol medication  (atorvastatin 80 mg daily) and denies myalgias. Her cholesterol is at goal. The cholesterol last visit was:   Lab Results  Component Value Date   CHOL 128 10/28/2017   HDL 46 (L) 10/28/2017   LDLCALC 66 10/28/2017   TRIG 76 10/28/2017   CHOLHDL 2.8 10/28/2017    She has not been working on diet and exercise for T2 diabetes controlled on metformin, and denies foot ulcerations, hyperglycemia, hypoglycemia , increased appetite, nausea, polydipsia, polyuria, visual disturbances, vomiting and weight loss. Not currently checking fasting glucose, but does have all supplies that she needs. Family can assist if needed. Last A1C in the office was:  Lab Results  Component Value Date   HGBA1C 6.1 (H) 06/25/2018   She has hx of thyroid disease but has been borderline hyperthyroid, not on medications, and has been monitored closely:    Lab Results  Component Value Date   TSH 0.288 (L) 06/24/2018   Last GFR: Lab Results  Component Value Date   GFRNONAA 77 07/08/2018   Patient is not on Vitamin D supplement despite repeated recommendation to do so and remained well below goal of 70 at most recent check:   Lab Results  Component Value Date   VD25OH 27 (L) 07/14/2017      Medication Review: Current Outpatient Medications on File Prior to Visit  Medication Sig Dispense Refill  . aspirin EC 325 MG EC tablet Take 1 tablet (325  mg total) by mouth daily. 30 tablet 0  . atorvastatin (LIPITOR) 80 MG tablet TAKE 1 TABLET DAILY FOR CHOLESTEROL 90 tablet 4  . bisacodyl (DULCOLAX) 5 MG EC tablet Take 1 tablet (5 mg total) by mouth daily as needed for moderate constipation. 30 tablet 2  . blood glucose meter kit and supplies KIT Dispense based on patient and insurance preference. Check blood sugar 1 time [id number]. 1 each 0  . folic acid (FOLVITE) 1 MG tablet Take 1 tablet Daily 90 tablet 3  . nitroGLYCERIN (NITROSTAT) 0.4 MG SL tablet Dissolve 1 tablet under tongue every 3 to 5 minutes if needed for  ChestPain 50 tablet 12  . ondansetron (ZOFRAN ODT) 8 MG disintegrating tablet Dissolve 1 tablet under tongue every 6 hours for nausea  or vomitting (Patient taking differently: as needed. Dissolve 1 tablet under tongue every 6 hours for nausea  or vomitting) 30 tablet 0  . senna ([facility] SENNA) 8.6 MG tablet Take 1 tablet at Bedtime for Constipation 90 tablet 3  . metFORMIN (GLUCOPHAGE-XR) 500 MG 24 hr tablet Take 2 tablets 2 x /day with Meals for Diabetes (Patient not taking: Reported on 08/11/2018) 360 tablet 3  . metoCLOPramide (REGLAN) 5 MG tablet Take 1 tablet 4 x /day before Meals & Bedtime for Acid Reflux 360 tablet 3  . mirtazapine (REMERON) 15 MG tablet TAKE 1 TABLET ONE HOUR BEFORE SLEEP (Patient not taking: TAKE 1 TABLET ONE HOUR BEFORE SLEEP) 90 tablet 0   No current facility-administered medications on file prior to visit.     Allergies  Allergen Reactions  . Bee Venom Anaphylaxis and Other (See Comments)    Eyes swell, cannot breathe  . Persantine [Dipyridamole] Other (See Comments)    "went crazy"  . Actos [Pioglitazone] Other (See Comments)    UNSPECIFIED REACTION     Current Problems (verified) Patient Active Problem List   Diagnosis Date Noted  . Constipation 07/08/2018  . Anemia   . Nausea & vomiting 06/24/2018  . Hypotension due to hypovolemia 06/24/2018  . History of ischemic cardiomyopathy 06/24/2018  . Type 2 diabetes mellitus ([place]) 04/07/2017  . Mild dementia ([place]) 07/26/2016  . S/P CABG x 4   . Coronary artery disease 11/16/2015  . Medication management 07/27/2013  . Vitamin D deficiency 02/01/2013  . GERD (gastroesophageal reflux disease)   . Hypothyroidism   . CKD stage 2 due to type 2 diabetes mellitus ([place]) 06/04/2007  . Hyperlipidemia associated with type 2 diabetes mellitus ([place]) 06/04/2007  . BMI 24.0-24.9, adult 06/04/2007  . Essential hypertension 06/04/2007    Screening Tests Immunization History  Administered Date(s) Administered  . Influenza  Inj Mdck Quad Pf 10/31/2016  . Influenza Split 10/03/2014  . Influenza,inj,quad, With Preservative 10/31/2015  . Influenza-Unspecified 11/18/2012  . Pneumococcal Conjugate-13 11/04/2013  . Pneumococcal Polysaccharide-23 07/25/2010  . Pneumococcal-Unspecified 02/01/2001  . Td 02/01/2006, 04/09/2017   Preventative care: Last colonoscopy: 2014 Last mammogram: 2007 - declines Last pap smear/pelvic exam: 2012 DEXA:2015 late osteopenia - declines  Prior vaccinations: TD or Tdap: 2019  Influenza: 2018, [facility]   Pneumococcal: 2003, 2012 Prevnar13: 2015 Shingles/Zostavax: Declined due to cost  Names of Other Physician/Practitioners you currently use: 1. [facility]- here for primary care 2. Dr. Katy Fitch, eye doctor, last visit 10/29/2017 report confirmed and abstracted 3. Does not see, dentist, last visit unknown - full dentures  Patient Care Team: [name], MD as PCP - General (Internal Medicine)  Jerline Pain, MD as PCP - Cardiology (Cardiology) Warden Fillers, MD as Consulting Physician (Optometry) Stanford Breed, Denice Bors, MD as Consulting Physician (Cardiology) Sable Feil, MD as Consulting Physician (Gastroenterology)  SURGICAL HISTORY She  has a past surgical history that includes Dilation and curettage of uterus (1984); Breast biopsy (Left, 1999); Colonoscopy (05/2012); Cardiac catheterization (N/A, 11/15/2015); TEE without cardioversion (N/A, 11/16/2015); and Coronary artery bypass graft (11/16/2015). FAMILY HISTORY Her family history includes Breast cancer in her maternal aunt and maternal grandmother; Diabetes in her father; Lung cancer in her brother; Prostate cancer in her brother; Stroke in her mother; Throat cancer in her maternal uncle. SOCIAL HISTORY She  reports that she has never smoked. Her smokeless tobacco use includes snuff. She reports that she does not drink alcohol or use drugs.   MEDICARE  WELLNESS OBJECTIVES: Physical activity: Current Exercise Habits: The patient does not participate in regular exercise at present, Exercise limited by: neurologic condition(s) Cardiac risk factors:   Depression/mood screen:   Depression screen [facility] 2/9 08/11/2018  Decreased Interest 0  Down, Depressed, Hopeless 0  PHQ - 2 Score 0  Altered sleeping -  Tired, decreased energy -  Change in appetite -  Feeling bad or failure about yourself  -  Trouble concentrating -  Moving slowly or fidgety/restless -  Suicidal thoughts -  PHQ-9 Score -  Difficult doing work/chores -    ADLs:  In your present state of health, do you have any difficulty performing the following activities: 08/11/2018 06/24/2018  Hearing? N N  Vision? N N  Difficulty concentrating or making decisions? Y N  Comment remembering meds, no longer driving as she was getting lost, not recognizing faces as much, forgetting dates -  Walking or climbing stairs? N Y  Comment has handrails up porch steps -  Dressing or bathing? N N  Doing errands, shopping? Y Y  Comment driven by family -  Conservation officer, nature and eating ? N -  Using the Toilet? N -  In the past six months, have you accidently leaked urine? N -  Do you have problems with loss of bowel control? N -  Managing your Medications? Y -  Comment son calls to remind, pill box, DIL with visiting daily to check -  Managing your Finances? Y -  Comment son assists -  Housekeeping or managing your Housekeeping? N -  Comment family assists as needed -  Some recent data might be hidden     Cognitive Testing  Alert? Yes  Normal Appearance?Yes  Oriented to person? Yes  Place? Yes   Time? Yes  Recall of three objects?  No - 1/3  Can perform simple calculations? Yes  Displays appropriate judgment?Yes  Can read the correct time from a watch face?Yes  EOL planning: Does Patient Have a Medical Advance Directive?: No Would patient like information on creating a medical advance  directive?: Yes (MAU/Ambulatory/Procedural Areas - Information given)  Review of Systems  Constitutional: Negative for malaise/fatigue and weight loss.  HENT: Negative for hearing loss and tinnitus.   Eyes: Negative for blurred vision and double vision.  Respiratory: Negative for cough, sputum production, shortness of breath and wheezing.   Cardiovascular: Negative for chest pain, palpitations, orthopnea, claudication, leg swelling and PND.  Gastrointestinal: Negative for abdominal pain, blood in stool, constipation, diarrhea, heartburn, melena, nausea and vomiting.  Genitourinary: Negative.   Musculoskeletal: Negative for falls, joint pain and myalgias.  Skin: Negative for rash.  Neurological: Negative for dizziness, tingling, sensory  change, weakness and headaches.  Endo/Heme/Allergies: Negative for polydipsia.  Psychiatric/Behavioral: Negative.  Negative for depression, memory loss, substance abuse and suicidal ideas. The patient is not nervous/anxious and does not have insomnia.   All other systems reviewed and are negative.    Objective:     Today's Vitals   08/11/18 1512  BP: 102/60  Pulse: 89  Temp: (!) 97.5 F (36.4 C)  SpO2: 98%  Weight: 112 lb (50.8 kg)  Height: 5' 1"  (1.549 m)   Body mass index is 21.16 kg/m.  General appearance: alert, no distress, WD/WN, female HEENT: normocephalic, sclerae anicteric, TMs pearly, nares patent, no discharge or erythema, pharynx normal Oral cavity: MMM, no lesions Neck: supple, no lymphadenopathy, no thyromegaly, no masses Heart: RRR, normal S1, S2, no murmurs Lungs: CTA bilaterally, no wheezes, rhonchi, or rales Abdomen: +bs, soft, non tender, non distended, no masses, no hepatomegaly, no splenomegaly Musculoskeletal: nontender, no swelling, no obvious deformity Extremities: no edema, no cyanosis, no clubbing Pulses: 2+ symmetric, upper and lower extremities, normal cap refill Neurological: alert, oriented x 3, CN2-12 intact,  strength normal upper extremities and lower extremities, sensation normal throughout, DTRs 2+ throughout, no cerebellar signs, gait slow but normal Psychiatric: normal affect, behavior normal, pleasant   Medicare Attestation I have personally reviewed: The patient's medical and social history Their use of alcohol, tobacco or illicit drugs Their current medications and supplements The patient's functional ability including ADLs,fall risks, home safety risks, cognitive, and hearing and visual impairment Diet and physical activities Evidence for depression or mood disorders  The patient's weight, height, BMI, and visual acuity have been recorded in the chart.  I have made referrals, counseling, and provided education to the patient based on review of the above and I have provided the patient with a written personalized care plan for preventive services.     Izora Ribas, NP   08/11/2018

## 2018-08-11 ENCOUNTER — Other Ambulatory Visit: Payer: Self-pay

## 2018-08-11 ENCOUNTER — Encounter: Payer: Self-pay | Admitting: Adult Health

## 2018-08-11 ENCOUNTER — Ambulatory Visit (INDEPENDENT_AMBULATORY_CARE_PROVIDER_SITE_OTHER): Payer: Medicare Other | Admitting: Adult Health

## 2018-08-11 VITALS — BP 102/60 | HR 89 | Temp 97.5°F | Ht 61.0 in | Wt 112.0 lb

## 2018-08-11 DIAGNOSIS — Z0000 Encounter for general adult medical examination without abnormal findings: Secondary | ICD-10-CM

## 2018-08-11 DIAGNOSIS — E1122 Type 2 diabetes mellitus with diabetic chronic kidney disease: Secondary | ICD-10-CM | POA: Diagnosis not present

## 2018-08-11 DIAGNOSIS — I25708 Atherosclerosis of coronary artery bypass graft(s), unspecified, with other forms of angina pectoris: Secondary | ICD-10-CM | POA: Diagnosis not present

## 2018-08-11 DIAGNOSIS — I10 Essential (primary) hypertension: Secondary | ICD-10-CM | POA: Diagnosis not present

## 2018-08-11 DIAGNOSIS — D6489 Other specified anemias: Secondary | ICD-10-CM

## 2018-08-11 DIAGNOSIS — F0390 Unspecified dementia without behavioral disturbance: Secondary | ICD-10-CM

## 2018-08-11 DIAGNOSIS — E559 Vitamin D deficiency, unspecified: Secondary | ICD-10-CM

## 2018-08-11 DIAGNOSIS — E538 Deficiency of other specified B group vitamins: Secondary | ICD-10-CM

## 2018-08-11 DIAGNOSIS — K219 Gastro-esophageal reflux disease without esophagitis: Secondary | ICD-10-CM

## 2018-08-11 DIAGNOSIS — E039 Hypothyroidism, unspecified: Secondary | ICD-10-CM

## 2018-08-11 DIAGNOSIS — N182 Chronic kidney disease, stage 2 (mild): Secondary | ICD-10-CM

## 2018-08-11 DIAGNOSIS — Z6824 Body mass index (BMI) 24.0-24.9, adult: Secondary | ICD-10-CM

## 2018-08-11 DIAGNOSIS — E1169 Type 2 diabetes mellitus with other specified complication: Secondary | ICD-10-CM | POA: Diagnosis not present

## 2018-08-11 DIAGNOSIS — Z0001 Encounter for general adult medical examination with abnormal findings: Secondary | ICD-10-CM

## 2018-08-11 DIAGNOSIS — Z79899 Other long term (current) drug therapy: Secondary | ICD-10-CM

## 2018-08-11 DIAGNOSIS — E785 Hyperlipidemia, unspecified: Secondary | ICD-10-CM

## 2018-08-11 DIAGNOSIS — Z8679 Personal history of other diseases of the circulatory system: Secondary | ICD-10-CM

## 2018-08-11 DIAGNOSIS — Z951 Presence of aortocoronary bypass graft: Secondary | ICD-10-CM

## 2018-08-11 DIAGNOSIS — F03A0 Unspecified dementia, mild, without behavioral disturbance, psychotic disturbance, mood disturbance, and anxiety: Secondary | ICD-10-CM

## 2018-08-11 NOTE — Patient Instructions (Addendum)
[name] [name] , Thank you for taking time to come for your Medicare Wellness Visit. I appreciate your ongoing commitment to your health goals. Please review the following plan we discussed and let me know if I can assist you in the future.   These are the goals we discussed: Goals    . Blood Pressure < 140/90     110/60+ ; monitor daily    . DIET - INCREASE WATER INTAKE     Set out bottles of water daily for patient to complete- work up to at least 3-4 bottles daily     . Exercise 3x per week (15-20 min per time)       This is a list of the screening recommended for you and due dates:  Health Maintenance  Topic Date Due  . Urine Protein Check  01/06/2018  . Flu Shot  08/08/2018  . Complete foot exam   10/29/2018  . Eye exam for diabetics  10/30/2018  . Hemoglobin A1C  12/25/2018  . Tetanus Vaccine  04/10/2027  . DEXA scan (bone density measurement)  Completed  . Pneumonia vaccines  Completed     Please add a daily vitamin D supplement - 4000-5000 IU daily   Please set out water bottles daily for patient - work up to 3-4 bottles daily   Please verify at the end of the day if she has taken medications - ok to take most in the evening - caution with metformin if having stomach side effects with larger dose - some people do ok taking all at once, some do not  Please check fasting glucose 2-3 mornings a week  Please try to start walking or light exercise program - 15-20 min three days a week    Memory Compensation Strategies  1. Use "WARM" strategy.  W= write it down  A= associate it  R= repeat it  M= make a mental note  2.   You can keep a Social worker.  Use a 3-ring notebook with sections for the following: calendar, important names and phone numbers,  medications, doctors' names/phone numbers, lists/reminders, and a section to journal what you did  each day.   3.    Use a calendar to write appointments down.  4.    Write yourself a schedule for the day.  This  can be placed on the calendar or in a separate section of the Memory Notebook.  Keeping a  regular schedule can help memory.  5.    Use medication organizer with sections for each day or morning/evening pills.  You may need help loading it  6.    Keep a basket, or pegboard by the door.  Place items that you need to take out with you in the basket or on the pegboard.  You may also want to  include a message board for reminders.  7.    Use sticky notes.  Place sticky notes with reminders in a place where the task is performed.  For example: " turn off the  stove" placed by the stove, "lock the door" placed on the door at eye level, " take your medications" on  the bathroom mirror or by the place where you normally take your medications.  8.    Use alarms/timers.  Use while cooking to remind yourself to check on food or as a reminder to take your medicine, or as a  reminder to make a call, or as a reminder to perform another task, etc.  Dementia Caregiver Guide Dementia is a term used to describe a number of symptoms that affect memory and thinking. The most common symptoms include:  Memory loss.  Trouble with language and communication.  Trouble concentrating.  Poor judgment.  Problems with reasoning.  Child-like behavior and language.  Extreme anxiety.  Angry outbursts.  Wandering from home or public places. Dementia usually gets worse slowly over time. In the early stages, people with dementia can stay independent and safe with some help. In later stages, they need help with daily tasks such as dressing, grooming, and using the bathroom. How to help the person with dementia cope Dementia can be frightening and confusing. Here are some tips to help the person with dementia cope with changes caused by the disease. General tips  Keep the person on track with his or her routine.  Try to identify areas where the person may need help.  Be supportive, patient, calm, and  encouraging.  Gently remind the person that adjusting to changes takes time.  Help with the tasks that the person has asked for help with.  Keep the person involved in daily tasks and decisions as much as possible.  Encourage conversation, but try not to get frustrated or harried if the person struggles to find words or does not seem to appreciate your help. Communication tips  When the person is talking or seems frustrated, make eye contact and hold the person's hand.  Ask specific questions that need yes or no answers.  Use simple words, short sentences, and a calm voice. Only give one direction at a time.  When offering choices, limit them to just 1 or 2.  Avoid correcting the person in a negative way.  If the person is struggling to find the right words, gently try to help him or her. How to recognize symptoms of stress Symptoms of stress in caregivers include:  Feeling frustrated or angry with the person with dementia.  Denying that the person has dementia or that his or her symptoms will not improve.  Feeling hopeless and unappreciated.  Difficulty sleeping.  Difficulty concentrating.  Feeling anxious, irritable, or depressed.  Developing stress-related health problems.  Feeling like you have too little time for your own life. Follow these instructions at home:   Make sure that you and the person you are caring for: ? Get regular sleep. ? Exercise regularly. ? Eat regular, nutritious meals. ? Drink enough fluid to keep your urine clear or pale yellow. ? Take over-the-counter and prescription medicines only as told by your health care providers. ? Attend all scheduled health care appointments.  Join a support group with others who are caregivers.  Ask about respite care resources so that you can have a regular break from the stress of caregiving.  Look for signs of stress in yourself and in the person you are caring for. If you notice signs of stress, take  steps to manage it.  Consider any safety risks and take steps to avoid them.  Organize medications in a pill box for each day of the week.  Create a plan to handle any legal or financial matters. Get legal or financial advice if needed.  Keep a calendar in a central location to remind the person of appointments or other activities. Tips for reducing the risk of injury  Keep floors clear of clutter. Remove rugs, magazine racks, and floor lamps.  Keep hallways well lit, especially at night.  Put a handrail and nonslip mat in the bathtub  or shower.  Put childproof locks on cabinets that contain dangerous items, such as medicines, alcohol, guns, toxic cleaning items, sharp tools or utensils, matches, and lighters.  Put the locks in places where the person cannot see or reach them easily. This will help ensure that the person does not wander out of the house and get lost.  Be prepared for emergencies. Keep a list of emergency phone numbers and addresses in a convenient area.  Remove car keys and lock garage doors so that the person does not try to get in the car and drive.  Have the person wear a bracelet that tracks locations and identifies the person as having memory problems. This should be worn at all times for safety. Where to find support: Many individuals and organizations offer support. These include:  Support groups for people with dementia and for caregivers.  Counselors or therapists.  Home health care services.  Adult day care centers. Where to find more information Alzheimer's Association: [employer] Contact a health care provider if:  The person's health is rapidly getting worse.  You are no longer able to care for the person.  Caring for the person is affecting your physical and emotional health.  The person threatens himself or herself, you, or anyone else. Summary  Dementia is a term used to describe a number of symptoms that affect memory and  thinking.  Dementia usually gets worse slowly over time.  Take steps to reduce the person's risk of injury, and to plan for future care.  Caregivers need support, relief from caregiving, and time for their own lives. This information is not intended to replace advice given to you by your health care provider. Make sure you discuss any questions you have with your health care provider. Document Released: 11/28/2015 Document Revised: 12/06/2016 Document Reviewed: 11/28/2015 Elsevier Patient Education  2020 [employer].

## 2018-08-12 ENCOUNTER — Other Ambulatory Visit: Payer: Self-pay

## 2018-08-12 LAB — CBC WITH DIFFERENTIAL/PLATELET
Absolute Monocytes: 1115 cells/uL — ABNORMAL HIGH (ref 200–950)
Basophils Absolute: 20 cells/uL (ref 0–200)
Basophils Relative: 0.3 %
Eosinophils Absolute: 20 cells/uL (ref 15–500)
Eosinophils Relative: 0.3 %
HCT: 30.4 % — ABNORMAL LOW (ref 35.0–45.0)
Hemoglobin: 10.4 g/dL — ABNORMAL LOW (ref 11.7–15.5)
Lymphs Abs: 1531 cells/uL (ref 850–3900)
MCH: 32.6 pg (ref 27.0–33.0)
MCHC: 34.2 g/dL (ref 32.0–36.0)
MCV: 95.3 fL (ref 80.0–100.0)
MPV: 10.6 fL (ref 7.5–12.5)
Monocytes Relative: 16.9 %
Neutro Abs: 3914 cells/uL (ref 1500–7800)
Neutrophils Relative %: 59.3 %
Platelets: 286 10*3/uL (ref 140–400)
RBC: 3.19 10*6/uL — ABNORMAL LOW (ref 3.80–5.10)
RDW: 12.4 % (ref 11.0–15.0)
Total Lymphocyte: 23.2 %
WBC: 6.6 10*3/uL (ref 3.8–10.8)

## 2018-08-12 LAB — COMPLETE METABOLIC PANEL WITH GFR
AG Ratio: 1.1 (calc) (ref 1.0–2.5)
ALT: 5 U/L — ABNORMAL LOW (ref 6–29)
AST: 11 U/L (ref 10–35)
Albumin: 3.5 g/dL — ABNORMAL LOW (ref 3.6–5.1)
Alkaline phosphatase (APISO): 49 U/L (ref 37–153)
BUN: 11 mg/dL (ref 7–25)
CO2: 30 mmol/L (ref 20–32)
Calcium: 8.8 mg/dL (ref 8.6–10.4)
Chloride: 105 mmol/L (ref 98–110)
Creat: 0.74 mg/dL (ref 0.60–0.88)
GFR, Est African American: 86 mL/min/{1.73_m2} (ref >=60)
GFR, Est Non African American: 74 mL/min/{1.73_m2} (ref >=60)
Globulin: 3.2 g/dL (calc) (ref 1.9–3.7)
Glucose, Bld: 139 mg/dL — ABNORMAL HIGH (ref 65–99)
Potassium: 3.5 mmol/L (ref 3.5–5.3)
Sodium: 143 mmol/L (ref 135–146)
Total Bilirubin: 0.5 mg/dL (ref 0.2–1.2)
Total Protein: 6.7 g/dL (ref 6.1–8.1)

## 2018-08-12 LAB — MAGNESIUM: Magnesium: 1.6 mg/dL (ref 1.5–2.5)

## 2018-08-12 LAB — FOLATE RBC: RBC Folate: 1037 ng/mL RBC (ref >280)

## 2018-08-12 LAB — TSH: TSH: 0.66 mIU/L (ref 0.40–4.50)

## 2018-08-12 MED ORDER — FOLIC ACID 1 MG PO TABS: ORAL_TABLET | ORAL | 3 refills | Status: DC

## 2018-08-24 ENCOUNTER — Other Ambulatory Visit: Payer: Self-pay | Admitting: *Deleted

## 2018-08-24 DIAGNOSIS — R1112 Projectile vomiting: Secondary | ICD-10-CM

## 2018-08-24 MED ORDER — ONDANSETRON 8 MG PO TBDP: ORAL_TABLET | ORAL | 0 refills | Status: DC

## 2018-09-09 ENCOUNTER — Telehealth: Payer: Self-pay | Admitting: *Deleted

## 2018-09-09 NOTE — Telephone Encounter (Signed)
Son called and reported the Metoclopramide is causing the patient to have constipation.  Dr Melford Aase advised the patient to stop the medication and see if the constipation is relieved.

## 2018-09-13 ENCOUNTER — Other Ambulatory Visit: Payer: Self-pay | Admitting: Internal Medicine

## 2018-09-13 DIAGNOSIS — G4700 Insomnia, unspecified: Secondary | ICD-10-CM

## 2018-09-22 ENCOUNTER — Ambulatory Visit (INDEPENDENT_AMBULATORY_CARE_PROVIDER_SITE_OTHER): Payer: Medicare Other | Admitting: Cardiology

## 2018-09-22 ENCOUNTER — Other Ambulatory Visit: Payer: Self-pay

## 2018-09-22 ENCOUNTER — Encounter: Payer: Self-pay | Admitting: Cardiology

## 2018-09-22 VITALS — BP 112/50 | HR 88 | Ht 61.0 in | Wt 117.8 lb

## 2018-09-22 DIAGNOSIS — E119 Type 2 diabetes mellitus without complications: Secondary | ICD-10-CM | POA: Diagnosis not present

## 2018-09-22 DIAGNOSIS — I2510 Atherosclerotic heart disease of native coronary artery without angina pectoris: Secondary | ICD-10-CM

## 2018-09-22 DIAGNOSIS — I350 Nonrheumatic aortic (valve) stenosis: Secondary | ICD-10-CM | POA: Diagnosis not present

## 2018-09-22 DIAGNOSIS — I10 Essential (primary) hypertension: Secondary | ICD-10-CM

## 2018-09-22 DIAGNOSIS — E7800 Pure hypercholesterolemia, unspecified: Secondary | ICD-10-CM

## 2018-09-22 MED ORDER — ASPIRIN EC 81 MG PO TBEC: 81.0000 mg | DELAYED_RELEASE_TABLET | Freq: Every day | ORAL | 3 refills | Status: AC

## 2018-09-22 NOTE — Patient Instructions (Addendum)
Medication Instructions:  Please decrease your ASA to 81 mg daily. Please discontinue you Verapamil and/or Lisinopril. Continue all other medications as listed.  If you need a refill on your cardiac medications before your next appointment, please call your pharmacy.   Follow-Up: At [facility], you and your health needs are our priority.  As part of our continuing mission to provide you with exceptional heart care, we have created designated Provider Care Teams.  These Care Teams include your primary Cardiologist (physician) and Advanced Practice Providers (APPs -  Physician Assistants and Nurse Practitioners) who all work together to provide you with the care you need, when you need it. You will need a follow up appointment in 12 months.  Please call our office 2 months in advance to schedule this appointment.  You may see Candee Furbish, MD or one of the following Advanced Practice Providers on your designated Care Team:   Truitt Merle, NP Cecilie Kicks, NP . Kathyrn Drown, NP  Thank you for choosing [facility]!!

## 2018-09-22 NOTE — Progress Notes (Signed)
Cardiology Office Note    Date:  09/22/2018   ID:  [name] [name], [name] 15-Jul-1931, MRN [id number]  PCP:  [name], MD  Cardiologist:   Candee Furbish, MD     History of Present Illness:  [name] [name] is a 85 y.o. female with coronary artery disease status post CABG in 2017 after deep T wave inversions were noted on ECG.  She has mild to moderate aortic stenosis.  Murmur heard on exam.  She is also had hypotension, was in the hospital in June with very low blood pressure.  Overall no syncope no bleeding no fevers chills nausea vomiting.  Her son is here with her.  They have been taking verapamil 240 once a day.  They had not started the lisinopril 20 mg once a day yet.  I have asked him to stop both of these medications or not to take them.  Blood pressure currently 112/50, previously 100/60.    Past Medical History:  Diagnosis Date   Anemia    Anginal pain ([place])    Cataract    Colon polyp 2009    TUBULAR ADENOMA   Coronary artery disease    Depression    Diabetes ([place])    Diverticulosis of colon (without mention of hemorrhage) 2009   Esophagitis 2009   Gastritis 2009   GERD (gastroesophageal reflux disease) 2009   Hiatal hernia 2009   Hyperlipemia    Hypertension    Thyroid disease    hypothyroid    Past Surgical History:  Procedure Laterality Date   BREAST BIOPSY Left 1999   neg   CARDIAC CATHETERIZATION N/A 11/15/2015   Procedure: Left Heart Cath and Coronary Angiography;  Surgeon: Troy Sine, MD;  Location: [facility] [facility];  Service: Cardiovascular;  Laterality: N/A;   COLONOSCOPY  05/2012   Dr. Sharlett Iles   CORONARY ARTERY BYPASS GRAFT  11/16/2015   Procedure: OFF PUMP CORONARY ARTERY BYPASS GRAFTING (CABG) x FOUR, USING LEFT MAMMARY ARTERY  AND ENDOSCOPICALLY HARVESTED RIGHT GREATER SAPHENOUS VEIN.;  Surgeon: Melrose Nakayama, MD;  Location: [facility];  Service: Open Heart Surgery;;   DILATION AND CURETTAGE OF UTERUS  1984     TEE WITHOUT CARDIOVERSION N/A 11/16/2015   Procedure: TRANSESOPHAGEAL ECHOCARDIOGRAM (TEE);  Surgeon: Melrose Nakayama, MD;  Location: [facility];  Service: Open Heart Surgery;  Laterality: N/A;    Current Medications: Outpatient Medications Prior to Visit  Medication Sig Dispense Refill   atorvastatin (LIPITOR) 80 MG tablet TAKE 1 TABLET DAILY FOR CHOLESTEROL 90 tablet 4   bisacodyl (DULCOLAX) 5 MG EC tablet Take 1 tablet (5 mg total) by mouth daily as needed for moderate constipation. 30 tablet 2   blood glucose meter kit and supplies KIT Dispense based on patient and insurance preference. Check blood sugar 1 time [id number]. 1 each 0   folic acid (FOLVITE) 1 MG tablet Take 1 tablet Daily 90 tablet 3   isosorbide mononitrate (IMDUR) 30 MG 24 hr tablet Take 30 mg by mouth as needed.     metFORMIN (GLUCOPHAGE-XR) 500 MG 24 hr tablet Take 2 tablets 2 x /day with Meals for Diabetes 360 tablet 3   mirtazapine (REMERON) 15 MG tablet TAKE 1 TABLET ONE HOUR BEFORE SLEEP 90 tablet 1   nitroGLYCERIN (NITROSTAT) 0.4 MG SL tablet Dissolve 1 tablet under tongue every 3 to 5 minutes if needed for ChestPain 50 tablet 12   ondansetron (ZOFRAN ODT) 8 MG disintegrating tablet Dissolve 1 tablet under tongue  every 6 hours for nausea  or vomitting 30 tablet 0   senna ([employer] SENNA) 8.6 MG tablet Take 1 tablet at Bedtime for Constipation 90 tablet 3   aspirin EC 325 MG EC tablet Take 1 tablet (325 mg total) by mouth daily. 30 tablet 0   lisinopril (ZESTRIL) 20 MG tablet Take 20 mg by mouth daily.     metoCLOPramide (REGLAN) 5 MG tablet Take 1 tablet 4 x /day before Meals & Bedtime for Acid Reflux (Patient not taking: Reported on 09/22/2018) 360 tablet 3   No facility-administered medications prior to visit.      Allergies:   Bee venom, Persantine [dipyridamole], and Actos [pioglitazone]   Social History   Socioeconomic History   Marital status: Married    Spouse name: Not on file    Number of children: 3   Years of education: Not on file   Highest education level: Not on file  Occupational History   Not on file  Social Needs   Financial resource strain: Not on file   Food insecurity    Worry: Not on file    Inability: Not on file   Transportation needs    Medical: Not on file    Non-medical: Not on file  Tobacco Use   Smoking status: Never Smoker   Smokeless tobacco: Current User    Types: Snuff  Substance and Sexual Activity   Alcohol use: No   Drug use: No   Sexual activity: Not on file  Lifestyle   Physical activity    Days per week: Not on file    Minutes per session: Not on file   Stress: Not on file  Relationships   Social connections    Talks on phone: Not on file    Gets together: Not on file    Attends religious service: Not on file    Active member of club or organization: Not on file    Attends meetings of clubs or organizations: Not on file    Relationship status: Not on file  Other Topics Concern   Not on file  Social History Narrative   Not on file     Family History:  The patient's family history includes Breast cancer in her maternal aunt and maternal grandmother; Diabetes in her father; Lung cancer in her brother; Prostate cancer in her brother; Stroke in her mother; Throat cancer in her maternal uncle.   ROS:   Please see the history of present illness.    Review of Systems  All other systems reviewed and are negative.     PHYSICAL EXAM:   VS:  BP (!) 112/50    Pulse 88    Ht 5' 1"  (1.549 m)    Wt 117 lb 12.8 oz (53.4 kg)    LMP  (LMP Unknown)    SpO2 98%    BMI 22.26 kg/m    GEN: Well nourished, well developed, in no acute distress Slightly disheveled HEENT: normal  Neck: no JVD, carotid bruits, or masses Cardiac: RRR;  2/6 systolic murmur heard at apex, rubs, or gallops,no edema  Respiratory:  clear to auscultation bilaterally, normal work of breathing GI: soft, nontender, nondistended, + BS MS: no  deformity or atrophy  Skin: warm and dry, no rash Neuro:  Alert and Oriented x 3, Strength and sensation are intact Psych: euthymic mood, full affect  Wt Readings from Last 3 Encounters:  09/22/18 117 lb 12.8 oz (53.4 kg)  08/11/18 112 lb (50.8 kg)  07/08/18 112 lb (50.8 kg)      Studies/Labs Reviewed:   EKG:  EKG is not ordered today.  Prior EKGs as described above  Recent Labs: 08/11/2018: ALT 5; BUN 11; Creat 0.74; Hemoglobin 10.4; Magnesium 1.6; Platelets 286; Potassium 3.5; Sodium 143; TSH 0.66   Lipid Panel    Component Value Date/Time   CHOL 128 10/28/2017 1353   TRIG 76 10/28/2017 1353   HDL 46 (L) 10/28/2017 1353   CHOLHDL 2.8 10/28/2017 1353   VLDL 16 06/26/2016 1757   LDLCALC 66 10/28/2017 1353    Additional studies/ records that were reviewed today include:  Prior EKGs personally reviewed, medical records, lab work, office notes reviewed  Cardiac catheterization 89/03/7340:  LV end diastolic pressure is normal.  There is moderate left ventricular systolic dysfunction.  Ramus lesion, 80 %stenosed.  Ost Ramus lesion, 70 %stenosed.  Ost LAD lesion, 80 %stenosed.  Prox LAD to Mid LAD lesion, 50 %stenosed.  Mid LAD lesion, 95 %stenosed.  Dist LAD lesion, 30 %stenosed.  Ost Cx lesion, 85 %stenosed.  Prox Cx lesion, 95 %stenosed.  Ost RCA lesion, 95 %stenosed.  Prox RCA lesion, 95 %stenosed.  Mid RCA lesion, 60 %stenosed.   Moderate LV dysfunction with an ejection fraction of 35-40% with a focal area of upper mid anterolateral hypocontractility and mid to basal inferior hypokinesis.  Severely calcified coronary arteries with severe multivessel CAD with 80% ostial LAD stenosis followed by diffuse narrowing of 50%.  Prior to a 95% proximal to mid stenosis with 30% mid distal stenosis; 70% ostial 80% proximal ramus intermediate stenosis; 80% proximal followed by 95% left circumflex stenosis; an ostial 95% RCA stenosis followed by proximal 95% and mid 60%  stenoses.  RECOMMENDATION: Surgical consultation for CABG revascularization surgery.   Echocardiogram 06/26/2018:    1. The left ventricle has normal systolic function with an ejection fraction of 60-65%. The cavity size was normal. Left ventricular diastolic Doppler parameters are indeterminate.  2. The right ventricle has normal systolic function. The cavity was normal. There is no increase in right ventricular wall thickness. Right ventricular systolic pressure could not be assessed.  3. There is mild mitral annular calcification present. No evidence of mitral valve stenosis.  4. The aortic valve is tricuspid. Moderate thickening of the aortic valve. Moderate calcification of the aortic valve. Aortic valve regurgitation is moderate by color flow Doppler. Mild stenosis of the aortic valve.  5. The ascending aorta is normal in size and structure.   ASSESSMENT:    1. Diabetes mellitus with coincident hypertension ([place])   2. Coronary artery disease involving native coronary artery of native heart without angina pectoris   3. Mild aortic stenosis   4. Pure hypercholesterolemia      PLAN:  In order of problems listed above: CAD post CABG - CABG x 4 04/27/2015.  Dr. Roxan Hockey.  LIMA to LAD SVG to ramus intermedius SVG to OM SVG to PDA.  EF improved from 25% up to 40% post bypass. -Okay to go to aspirin 81 mg.  Diabetes  -Excellent control per main team.  Hemoglobin A1c 6.1.   Essential hypertension  -Blood pressures have been running fairly low.  At times 100/60.  She was hospitalized in June with low blood pressure.  I would like for her to be off of her verapamil and not to start her lisinopril.  Would see how she does off of all blood pressure medications.  Her son will call me if he has any difficulties.  Hyperlipidemia  - Continue with atorvastatin 80 mg, high intensity, last LDL 66 at goal.  Mild aortic stenosis/moderate aortic regurgitation -Normal EF on echo.  2020.   Continue to monitor.  I will see her back in 1 year.  Medication Adjustments/Labs and Tests Ordered: Current medicines are reviewed at length with the patient today.  Concerns regarding medicines are outlined above.  Medication changes, Labs and Tests ordered today are listed in the Patient Instructions below. Patient Instructions  Medication Instructions:  Please decrease your ASA to 81 mg daily. Please discontinue you Verapamil and/or Lisinopril. Continue all other medications as listed.  If you need a refill on your cardiac medications before your next appointment, please call your pharmacy.   Follow-Up: At [facility], you and your health needs are our priority.  As part of our continuing mission to provide you with exceptional heart care, we have created designated Provider Care Teams.  These Care Teams include your primary Cardiologist (physician) and Advanced Practice Providers (APPs -  Physician Assistants and Nurse Practitioners) who all work together to provide you with the care you need, when you need it. You will need a follow up appointment in 12 months.  Please call our office 2 months in advance to schedule this appointment.  You may see Candee Furbish, MD or one of the following Advanced Practice Providers on your designated Care Team:   Truitt Merle, NP Cecilie Kicks, NP  Kathyrn Drown, NP  Thank you for choosing [facility]!!          Signed, Candee Furbish, MD  09/22/2018 3:12 PM    [facility] [address], [place], [place]  [address] Phone: [phone]; Fax: [phone]

## 2018-09-28 ENCOUNTER — Ambulatory Visit: Payer: Medicare Other | Admitting: Cardiology

## 2018-09-29 ENCOUNTER — Ambulatory Visit: Payer: Medicare Other | Admitting: Adult Health

## 2018-11-11 ENCOUNTER — Other Ambulatory Visit: Payer: Self-pay

## 2018-11-11 ENCOUNTER — Other Ambulatory Visit: Payer: Self-pay | Admitting: *Deleted

## 2018-11-11 ENCOUNTER — Encounter: Payer: Self-pay | Admitting: Internal Medicine

## 2018-11-11 ENCOUNTER — Ambulatory Visit (INDEPENDENT_AMBULATORY_CARE_PROVIDER_SITE_OTHER): Payer: Medicare Other | Admitting: Internal Medicine

## 2018-11-11 VITALS — BP 124/62 | HR 68 | Temp 97.7°F | Resp 16 | Ht 61.5 in | Wt 119.4 lb

## 2018-11-11 DIAGNOSIS — Z23 Encounter for immunization: Secondary | ICD-10-CM

## 2018-11-11 DIAGNOSIS — E1151 Type 2 diabetes mellitus with diabetic peripheral angiopathy without gangrene: Secondary | ICD-10-CM

## 2018-11-11 DIAGNOSIS — E1169 Type 2 diabetes mellitus with other specified complication: Secondary | ICD-10-CM

## 2018-11-11 DIAGNOSIS — E559 Vitamin D deficiency, unspecified: Secondary | ICD-10-CM

## 2018-11-11 DIAGNOSIS — Z951 Presence of aortocoronary bypass graft: Secondary | ICD-10-CM

## 2018-11-11 DIAGNOSIS — Z79899 Other long term (current) drug therapy: Secondary | ICD-10-CM

## 2018-11-11 DIAGNOSIS — E1122 Type 2 diabetes mellitus with diabetic chronic kidney disease: Secondary | ICD-10-CM

## 2018-11-11 DIAGNOSIS — F0150 Vascular dementia without behavioral disturbance: Secondary | ICD-10-CM

## 2018-11-11 DIAGNOSIS — Z136 Encounter for screening for cardiovascular disorders: Secondary | ICD-10-CM

## 2018-11-11 DIAGNOSIS — Z1211 Encounter for screening for malignant neoplasm of colon: Secondary | ICD-10-CM

## 2018-11-11 DIAGNOSIS — E785 Hyperlipidemia, unspecified: Secondary | ICD-10-CM

## 2018-11-11 DIAGNOSIS — R1112 Projectile vomiting: Secondary | ICD-10-CM

## 2018-11-11 DIAGNOSIS — Z0000 Encounter for general adult medical examination without abnormal findings: Secondary | ICD-10-CM | POA: Diagnosis not present

## 2018-11-11 DIAGNOSIS — E0590 Thyrotoxicosis, unspecified without thyrotoxic crisis or storm: Secondary | ICD-10-CM

## 2018-11-11 DIAGNOSIS — I10 Essential (primary) hypertension: Secondary | ICD-10-CM | POA: Diagnosis not present

## 2018-11-11 DIAGNOSIS — Z823 Family history of stroke: Secondary | ICD-10-CM

## 2018-11-11 DIAGNOSIS — Z0001 Encounter for general adult medical examination with abnormal findings: Secondary | ICD-10-CM

## 2018-11-11 DIAGNOSIS — Z1212 Encounter for screening for malignant neoplasm of rectum: Secondary | ICD-10-CM

## 2018-11-11 DIAGNOSIS — Z8679 Personal history of other diseases of the circulatory system: Secondary | ICD-10-CM

## 2018-11-11 DIAGNOSIS — N182 Chronic kidney disease, stage 2 (mild): Secondary | ICD-10-CM

## 2018-11-11 MED ORDER — ONDANSETRON 8 MG PO TBDP: ORAL_TABLET | ORAL | 0 refills | Status: DC

## 2018-11-11 MED ORDER — ESCITALOPRAM OXALATE 5 MG PO TABS: ORAL_TABLET | ORAL | 3 refills | Status: DC

## 2018-11-11 MED ORDER — BLOOD GLUCOSE MONITOR KIT: PACK | 0 refills | Status: AC

## 2018-11-11 MED ORDER — NITROGLYCERIN 0.4 MG SL SUBL: SUBLINGUAL_TABLET | SUBLINGUAL | 12 refills | Status: DC

## 2018-11-11 NOTE — Patient Instructions (Signed)

## 2018-11-11 NOTE — Progress Notes (Signed)
Annual Screening/Preventative Visit & Comprehensive Evaluation &  Examination     This very nice 85 y.o. MWF presents for a Screening /Preventative Visit & comprehensive evaluation and management of multiple medical co-morbidities.  Patient has been followed for HTN, ASCAD, HLD, T2_NIDDM, VascularDemenrtia   and Vitamin D Deficiency.      HTN predates since 8.   She presented in Dec 2017,with an acute MI underwent emergent CABG x 4V.  Patient's BP has been controlled at home and patient denies any cardiac symptoms as chest pain, palpitations, shortness of breath, dizziness or ankle swelling. Today's BP is at goal - 124/62.      Patient's hyperlipidemia is controlled with diet and medications. Patient denies myalgias or other medication SE's. Last lipids were at goal:  Lab Results  Component Value Date   CHOL 128 10/28/2017   HDL 46 (L) 10/28/2017   LDLCALC 66 10/28/2017   TRIG 76 10/28/2017   CHOLHDL 2.8 10/28/2017      Patient has hx/o T2_NIDDM predating since 1996  & has CKD2and patient denies reactive hypoglycemic symptoms, visual blurring, diabetic polys or paresthesias. Last A1c was not at goal:  Lab Results  Component Value Date   HGBA1C 6.1 (H) 06/25/2018      Patient has been monitored several years for intermittent mild occult or sub-clinical Hyperthyroidism.     Finally, patient has history of Vitamin D Deficiency("27" / 2008)  and last Vitamin D was very low :  Lab Results  Component Value Date   VD25OH 13 (L) 07/14/2017   Current Outpatient Medications on File Prior to Visit  Medication Sig  . aspirin EC 81 MG tablet Take 1 tablet (81 mg total) by mouth daily.  Marland Kitchen atorvastatin (LIPITOR) 80 MG tablet TAKE 1 TABLET DAILY FOR CHOLESTEROL  . bisacodyl (DULCOLAX) 5 MG EC tablet Take 1 tablet (5 mg total) by mouth daily as needed for moderate constipation.  . folic acid (FOLVITE) 1 MG tablet Take 1 tablet Daily  . isosorbide mononitrate (IMDUR) 30 MG 24 hr tablet Take  30 mg by mouth as needed.  . metFORMIN (GLUCOPHAGE-XR) 500 MG 24 hr tablet Take 2 tablets 2 x /day with Meals for Diabetes  . mirtazapine (REMERON) 15 MG tablet TAKE 1 TABLET ONE HOUR BEFORE SLEEP  . senna ([employer] SENNA) 8.6 MG tablet Take 1 tablet at Bedtime for Constipation   No current facility-administered medications on file prior to visit.    Allergies  Allergen Reactions  . Bee Venom Anaphylaxis and Other (See Comments)    Eyes swell, cannot breathe  . Persantine [Dipyridamole] Other (See Comments)    "went crazy"  . Actos [Pioglitazone] Other (See Comments)    UNSPECIFIED REACTION    Past Medical History:  Diagnosis Date  . Anemia   . Anginal pain ([place])   . Cataract   . Colon polyp 2009    TUBULAR ADENOMA  . Coronary artery disease   . Depression   . Diabetes ([place])   . Diverticulosis of colon (without mention of hemorrhage) 2009  . Esophagitis 2009  . Gastritis 2009  . GERD (gastroesophageal reflux disease) 2009  . Hiatal hernia 2009  . Hyperlipemia   . Hypertension   . Thyroid disease    hypothyroid   Health Maintenance  Topic Date Due  . URINE MICROALBUMIN  01/06/2018  . INFLUENZA VACCINE  08/08/2018  . OPHTHALMOLOGY EXAM  10/30/2018  . HEMOGLOBIN A1C  12/25/2018  . FOOT EXAM  11/11/2019  . TETANUS/TDAP  04/10/2027  . DEXA SCAN  Completed  . PNA vac Low Risk Adult  Completed   Immunization History  Administered Date(s) Administered  . Influenza Inj Mdck Quad Pf 10/31/2016  . Influenza Split 10/03/2014  . Influenza,inj,quad, With Preservative 10/31/2015  . Influenza-Unspecified 11/18/2012  . Pneumococcal Conjugate-13 11/04/2013  . Pneumococcal Polysaccharide-23 07/25/2010  . Pneumococcal-Unspecified 02/01/2001  . Td 02/01/2006, 04/09/2017   Last Colon - 04/29/2012 - Dr Sharlett Iles - Recc No F/U due to age  Last MGM - 2007 - declines f/u  Past Surgical History:  Procedure Laterality Date  . BREAST BIOPSY Left 1999   neg  . CARDIAC CATHETERIZATION  N/A 11/15/2015   Procedure: Left Heart Cath and Coronary Angiography;  Surgeon: Troy Sine, MD;  Location: [facility];  Service: Cardiovascular;  Laterality: N/A;  . COLONOSCOPY  05/2012   Dr. Sharlett Iles  . CORONARY ARTERY BYPASS GRAFT  11/16/2015   Procedure: OFF PUMP CORONARY ARTERY BYPASS GRAFTING (CABG) x FOUR, USING LEFT MAMMARY ARTERY  AND ENDOSCOPICALLY HARVESTED RIGHT GREATER SAPHENOUS VEIN.;  Surgeon: Melrose Nakayama, MD;  Location: [facility];  Service: Open Heart Surgery;;  . DILATION AND CURETTAGE OF UTERUS  1984  . TEE WITHOUT CARDIOVERSION N/A 11/16/2015   Procedure: TRANSESOPHAGEAL ECHOCARDIOGRAM (TEE);  Surgeon: Melrose Nakayama, MD;  Location: [facility];  Service: Open Heart Surgery;  Laterality: N/A;   Family History  Problem Relation Age of Onset  . Diabetes Father   . Stroke Mother   . Breast cancer Maternal Aunt   . Throat cancer Maternal Uncle   . Lung cancer Brother   . Prostate cancer Brother   . Breast cancer Maternal Grandmother   . Rectal cancer Neg Hx   . Stomach cancer Neg Hx   . Colon cancer Neg Hx   . Esophageal cancer Neg Hx    Social History   Tobacco Use  . Smoking status: Never Smoker  . Smokeless tobacco: Current User    Types: Snuff  Substance Use Topics  . Alcohol use: No  . Drug use: No    ROS Constitutional: Denies fever, chills, weight loss/gain, headaches, insomnia,  night sweats, and change in appetite. Does c/o fatigue. Eyes: Denies redness, blurred vision, diplopia, discharge, itchy, watery eyes.  ENT: Denies discharge, congestion, post nasal drip, epistaxis, sore throat, earache, hearing loss, dental pain, Tinnitus, Vertigo, Sinus pain, snoring.  Cardio: Denies chest pain, palpitations, irregular heartbeat, syncope, dyspnea, diaphoresis, orthopnea, PND, claudication, edema Respiratory: denies cough, dyspnea, DOE, pleurisy, hoarseness, laryngitis, wheezing.  Gastrointestinal: Denies dysphagia, heartburn, reflux, water brash,  pain, cramps, nausea, vomiting, bloating, diarrhea, constipation, hematemesis, melena, hematochezia, jaundice, hemorrhoids Genitourinary: Denies dysuria, frequency, urgency, nocturia, hesitancy, discharge, hematuria, flank pain Breast: Breast lumps, nipple discharge, bleeding.  Musculoskeletal: Denies arthralgia, myalgia, stiffness, Jt. Swelling, pain, limp, and strain/sprain. Denies falls. Skin: Denies puritis, rash, hives, warts, acne, eczema, changing in skin lesion Neuro: No weakness, tremor, incoordination, spasms, paresthesia, pain Psychiatric: Denies confusion, memory loss, sensory loss. Denies Depression. Endocrine: Denies change in weight, skin, hair change, nocturia, and paresthesia, diabetic polys, visual blurring, hyper / hypo glycemic episodes.  Heme/Lymph: No excessive bleeding, bruising, enlarged lymph nodes.  Physical Exam  BP 124/62   Pulse 68   Temp 97.7 F (36.5 C)   Resp 16   Ht 5' 1.5" (1.562 m)   Wt 119 lb 6.4 oz (54.2 kg)   LMP  (LMP Unknown)   BMI 22.20 kg/m   General Appearance: Well nourished and in  no apparent distress.  Eyes: PERRLA, EOMs, conjunctiva no swelling or erythema, normal fundi and vessels. Sinuses: No frontal/maxillary tenderness ENT/Mouth: EACs patent / TMs  nl. Nares clear without erythema, swelling, mucoid exudates. Oral hygiene is good. No erythema, swelling, or exudate. Tongue normal, non-obstructing. Tonsils not swollen or erythematous. Hearing normal.  Neck: Supple, thyroid not palpable. No bruits, nodes or JVD. Respiratory: Respiratory effort normal.  BS equal and clear bilateral without rales, rhonci, wheezing or stridor. Cardio: Heart sounds are normal with regular rate and rhythm and no murmurs, rubs or gallops. Peripheral pulses 1+ poorly palpable without edema. No aortic or femoral bruits. Chest: symmetric with normal excursions and percussion. Breasts: Symmetric, without lumps, nipple discharge, retractions, or fibrocystic changes.   Abdomen: Flat, soft with bowel sounds active. Nontender, no guarding, rebound, hernias, masses, or organomegaly.  Lymphatics: Non tender without lymphadenopathy.  Musculoskeletal: Full ROM all peripheral extremities, joint stability, 5/5 strength, and normal gait. Skin: Warm and dry without rashes, lesions, cyanosis, clubbing or  ecchymosis. Suspect BCE Lt temple area. Neuro: Cranial nerves intact, reflexes equal bilaterally. Normal muscle tone, no cerebellar symptoms. Sensation intact to touch, vibratory and Monofilament to the toes bilaterally. Pysch: Alert and oriented X 3, normal affect, Insight and Judgment appropriate.   Assessment and Plan  1. Annual Preventative Screening Examination  2. Essential hypertension  - EKG 12-Lead - Urinalysis, Routine w reflex microscopic - Microalbumin / creatinine urine ratio - CBC with Differential/Platelet - COMPLETE METABOLIC PANEL WITH GFR - Magnesium - TSH  3. Hyperlipidemia associated with type 2 diabetes mellitus (HCC)  - EKG 12-Lead - Lipid panel - TSH  4. Vitamin D deficiency  - VITAMIN D 25 Hydroxyl  5. Type 2 diabetes mellitus with stage 2 chronic kidney disease,  without long-term current use of insulin (HCC)  - EKG 12-Lead - Urinalysis, Routine w reflex microscopic - Microalbumin / creatinine urine ratio - HM DIABETES FOOT EXAM - LOW EXTREMITY NEUR EXAM DOCUM - Hemoglobin A1c - Insulin, random  6. DM (diabetes mellitus) with peripheral vascular complication (HCC)   7. Subclinical Hyperthyroidism, hx  - TSH  8. Vascular dementia without behavioral disturbance ([place])   9. Screening for ischemic heart disease  - EKG 12-Lead  10. Family history of stroke  - EKG 12-Lead  11. Medication management  - Urinalysis, Routine w reflex microscopic - Microalbumin / creatinine urine ratio - CBC with Differential/Platelet - COMPLETE METABOLIC PANEL WITH GFR - Magnesium - Lipid panel - TSH - Hemoglobin A1c -  Insulin, random - VITAMIN D 25 Hydroxl  12. Screening for colorectal cancer  - POC Hemoccult Bld/Stl   13. CKD stage 2 due to type 2 diabetes mellitus ([place])             Patient was counseled in prudent diet to achieve/maintain BMI less than 25 for weight control, BP monitoring, regular exercise and medications. Discussed med's effects and SE's. Screening labs and tests as requested with regular follow-up as recommended. Over 40 minutes of exam, counseling, chart review and high complex critical decision making was performed.   Kirtland Bouchard, MD

## 2018-11-12 LAB — HEMOGLOBIN A1C
Hgb A1c MFr Bld: 6.2 % of total Hgb — ABNORMAL HIGH (ref <5.7)
Mean Plasma Glucose: 131 (calc)
eAG (mmol/L): 7.3 (calc)

## 2018-11-12 LAB — COMPLETE METABOLIC PANEL WITH GFR
AG Ratio: 1.5 (calc) (ref 1.0–2.5)
ALT: 13 U/L (ref 6–29)
AST: 15 U/L (ref 10–35)
Albumin: 4.3 g/dL (ref 3.6–5.1)
Alkaline phosphatase (APISO): 42 U/L (ref 37–153)
BUN: 10 mg/dL (ref 7–25)
CO2: 27 mmol/L (ref 20–32)
Calcium: 9.6 mg/dL (ref 8.6–10.4)
Chloride: 105 mmol/L (ref 98–110)
Creat: 0.83 mg/dL (ref 0.60–0.88)
GFR, Est African American: 75 mL/min/{1.73_m2} (ref >=60)
GFR, Est Non African American: 64 mL/min/{1.73_m2} (ref >=60)
Globulin: 2.9 g/dL (calc) (ref 1.9–3.7)
Glucose, Bld: 133 mg/dL — ABNORMAL HIGH (ref 65–99)
Potassium: 4.5 mmol/L (ref 3.5–5.3)
Sodium: 141 mmol/L (ref 135–146)
Total Bilirubin: 0.5 mg/dL (ref 0.2–1.2)
Total Protein: 7.2 g/dL (ref 6.1–8.1)

## 2018-11-12 LAB — MAGNESIUM: Magnesium: 1.7 mg/dL (ref 1.5–2.5)

## 2018-11-12 LAB — CBC WITH DIFFERENTIAL/PLATELET
Absolute Monocytes: 1195 cells/uL — ABNORMAL HIGH (ref 200–950)
Basophils Absolute: 7 cells/uL (ref 0–200)
Basophils Relative: 0.1 %
Eosinophils Absolute: 7 cells/uL — ABNORMAL LOW (ref 15–500)
Eosinophils Relative: 0.1 %
HCT: 31.3 % — ABNORMAL LOW (ref 35.0–45.0)
Hemoglobin: 10.6 g/dL — ABNORMAL LOW (ref 11.7–15.5)
Lymphs Abs: 1850 cells/uL (ref 850–3900)
MCH: 30.5 pg (ref 27.0–33.0)
MCHC: 33.9 g/dL (ref 32.0–36.0)
MCV: 90.2 fL (ref 80.0–100.0)
MPV: 11 fL (ref 7.5–12.5)
Monocytes Relative: 16.6 %
Neutro Abs: 4140 cells/uL (ref 1500–7800)
Neutrophils Relative %: 57.5 %
Platelets: 211 10*3/uL (ref 140–400)
RBC: 3.47 10*6/uL — ABNORMAL LOW (ref 3.80–5.10)
RDW: 12.9 % (ref 11.0–15.0)
Total Lymphocyte: 25.7 %
WBC: 7.2 10*3/uL (ref 3.8–10.8)

## 2018-11-12 LAB — LIPID PANEL
Cholesterol: 121 mg/dL (ref <200)
HDL: 52 mg/dL (ref >=50)
LDL Cholesterol (Calc): 56 mg/dL (calc)
Non-HDL Cholesterol (Calc): 69 mg/dL (calc) (ref <130)
Total CHOL/HDL Ratio: 2.3 (calc) (ref <5.0)
Triglycerides: 57 mg/dL (ref <150)

## 2018-11-12 LAB — VITAMIN D 25 HYDROXY (VIT D DEFICIENCY, FRACTURES): Vit D, 25-Hydroxy: 61 ng/mL (ref 30–100)

## 2018-11-12 LAB — INSULIN, RANDOM: Insulin: 8 u[IU]/mL

## 2018-11-12 LAB — TSH: TSH: 0.57 mIU/L (ref 0.40–4.50)

## 2018-11-14 ENCOUNTER — Encounter: Payer: Self-pay | Admitting: Internal Medicine

## 2018-11-15 ENCOUNTER — Other Ambulatory Visit: Payer: Self-pay | Admitting: Adult Health

## 2018-11-25 NOTE — Progress Notes (Addendum)
    Subjective:    Patient ID: [name] [name], female    DOB: Jan 06, 1932, 85 y.o.   MRN: [id number]  HPI   Patient is a very nice 85 yo MWF presenting with 85 y.o. MWF presenting with a pink raised skin lesion lateral / inferior to the left orbit /cheek suspect for a skin cancer to have it removed.   Medication Sig  . aspirin EC 81 MG tablet Take 1 tablet (81 mg total) by mouth daily.  Marland Kitchen atorvastatin (LIPITOR) 80 MG tablet TAKE 1 TABLET DAILY FOR CHOLESTEROL  . blood glucose meter kit and supplies KIT Dispense based on patient and insurance preference. Check blood sugar 1 time daily-[id number].  Marland Kitchen escitalopram (LEXAPRO) 5 MG tablet Take 1 tablet Daily for Mood  . folic acid (FOLVITE) 1 MG tablet Take 1 tablet Daily  . isosorbide mononitrate (IMDUR) 30 MG 24 hr tablet Take 30 mg by mouth as needed.  . metFORMIN (GLUCOPHAGE-XR) 500 MG 24 hr tablet Take 2 tablets 2 x /day with Meals for Diabetes  . mirtazapine (REMERON) 15 MG tablet TAKE 1 TABLET ONE HOUR BEFORE SLEEP  . nitroGLYCERIN (NITROSTAT) 0.4 MG SL tablet Dissolve 1 tablet under tongue every 3 to 5 minutes if needed for ChestPain  . ondansetron (ZOFRAN ODT) 8 MG disintegrating tablet Dissolve 1 tablet under tongue every 6 hours for nausea  or vomitting  . senna ([employer] SENNA) 8.6 MG tablet Take 1 tablet at Bedtime for Constipation  . bisacodyl (DULCOLAX) 5 MG EC tablet Take 1 tablet (5 mg total) by mouth daily as needed for moderate constipation.   No facility-administered medications prior to visit.    Allergies  Allergen Reactions  . Bee Venom Anaphylaxis and Other (See Comments)    Eyes swell, cannot breathe  . Persantine [Dipyridamole] Other (See Comments)    "went crazy"  . Actos [Pioglitazone] Other (See Comments)    UNSPECIFIED REACTION    Review of Systems    10 point systems review negative except as above.    Objective:   Physical Exam  BP 124/60   Pulse 64   Temp (!) 97 F (36.1 C)   Resp 16   Ht 5' 1.5" (1.562 m)   Wt 123 lb 3.2 oz (55.9  kg)   LMP  (LMP Unknown)   BMI 22.90 kg/m   Skin - there is a   1.1Cm pink raised fleshy lesion of the Left cheek   Procedure (CPT : [phone])      After informed consent and aseptic prep with alcohol and local anesthesia with  2.0  ml of Marcaine 0.5%, the lesion was sharply excised full thickness in an elliptical fashion with a #10 scalpel. The the skin edges were undermined, everted approximate and aligned with #  3 vertical mattress sutures and 2 interrupted sutures of 4-0 Nylon. Antibiotic ung. and sterile bandage applied . Patient & son advised of post-op wound care.     Assessment & Plan:   1. Neoplasm of uncertain behavior of cheek  - Derm pathology  - ROV  5 days for suture removal

## 2018-11-26 ENCOUNTER — Ambulatory Visit (INDEPENDENT_AMBULATORY_CARE_PROVIDER_SITE_OTHER): Payer: Medicare Other | Admitting: Internal Medicine

## 2018-11-26 ENCOUNTER — Other Ambulatory Visit: Payer: Self-pay | Admitting: Internal Medicine

## 2018-11-26 ENCOUNTER — Encounter: Payer: Self-pay | Admitting: Internal Medicine

## 2018-11-26 ENCOUNTER — Other Ambulatory Visit: Payer: Self-pay

## 2018-11-26 VITALS — BP 124/60 | HR 64 | Temp 97.0°F | Resp 16 | Ht 61.5 in | Wt 123.2 lb

## 2018-11-26 DIAGNOSIS — D485 Neoplasm of uncertain behavior of skin: Secondary | ICD-10-CM

## 2018-11-26 DIAGNOSIS — C44300 Unspecified malignant neoplasm of skin of unspecified part of face: Secondary | ICD-10-CM

## 2018-11-28 ENCOUNTER — Other Ambulatory Visit: Payer: Self-pay | Admitting: Physician Assistant

## 2018-12-01 ENCOUNTER — Other Ambulatory Visit: Payer: Self-pay

## 2018-12-01 ENCOUNTER — Ambulatory Visit: Payer: Medicare Other | Admitting: Internal Medicine

## 2018-12-01 VITALS — BP 106/58 | HR 76 | Temp 97.4°F | Resp 16 | Ht 61.5 in | Wt 125.4 lb

## 2018-12-01 DIAGNOSIS — D485 Neoplasm of uncertain behavior of skin: Secondary | ICD-10-CM

## 2018-12-01 NOTE — Progress Notes (Signed)
   Sutures removed x 5 from Left Cheek biopsy site . Good healing ridge and no sign of infection.   Path report returned benign.

## 2018-12-04 ENCOUNTER — Other Ambulatory Visit: Payer: Self-pay | Admitting: Adult Health

## 2019-02-04 DIAGNOSIS — I700 Atherosclerosis of aorta: Secondary | ICD-10-CM | POA: Insufficient documentation

## 2019-02-04 NOTE — Progress Notes (Signed)
MEDICARE ANNUAL WELLNESS VISIT AND FOLLOW UP  Assessment:   Diagnoses and all orders for this visit:  Encounter for Medicare annual wellness exam  Essential hypertension Continue medications Monitor blood pressure at home; call if consistently over 130/80 Continue DASH diet.   Reminder to go to the ER if any CP, SOB, nausea, dizziness, severe HA, changes vision/speech, left arm numbness and tingling and jaw pain.  Atherosclerosis of aorta Per CT chest 2017 Control blood pressure, cholesterol, glucose, increase exercise.   Coronary artery disease involving coronary bypass graft with other forms of angina pectoris, unspecified whether native or transplanted heart [facility]) S/p CABG x 4, no further chest pain on imdur Control blood pressure, cholesterol, glucose, increase exercise.  Followed by cardiology  S/P CABG x 4 Control blood pressure, cholesterol, glucose, increase exercise. Follows with cardiology    Gastroesophageal reflux disease, esophagitis presence not specified Well managed off of medications at this time - no problems in over a year Discussed diet, avoiding triggers and other lifestyle changes  Acquired hypothyroidism Currently borderline hyperthyroid off of medications with monitoring only Check TSH, refer as indicated for follow up if getting worse  Type 2 diabetes mellitus with stage 2 chronic kidney disease, without long-term current use of insulin [facility]) Education: Reviewed 'ABCs' of diabetes management (respective goals in parentheses):  A1C (<7), blood pressure (<130/80), and cholesterol (LDL <70) Eye Exam yearly (verified currently up to date) and Dental Exam every 6 months. Defer foot exam as just had at CPE last visit- Dietary recommendations Physical Activity recommendations  CKD stage 2 due to type 2 diabetes mellitus (HCC) Increase fluids, avoid NSAIDS, monitor sugars, will monitor CMP/GFR  Mild dementia Monitoring only; independent for most ADLs,  driven by son/daughter in law with some supervision for medication management and bills Control blood pressure, cholesterol, glucose, emphasized need for increased exercise.  Followed by Dr. Delice Lesch  Vitamin D deficiency Continue supplementation as tolerated for Check vitamin D level  BMI 24 Long discussion about weight loss, diet, and exercise Recommended diet heavy in fruits and veggies and low in animal meats, cheeses, and dairy products, appropriate calorie intake Discussed appropriate weight for height Follow up at next visit  Medication management CBC, CMP/GFR  Hyperlipidemia associated with T2DM Continue medications: atorvastatin  LDL goal <70 Continue low cholesterol diet and exercise.  Check lipid panel.   Anemia Check CBC Recheck folate, on B12 supplement  Over 40 minutes of exam, counseling, chart review and critical decision making was performed Future Appointments  Date Time Provider [place]  05/17/2019  2:30 PM [name], MD [facility] None  12/16/2019  2:00 PM [name], MD [facility] None     Plan:   During the course of the visit the patient was educated and counseled about appropriate screening and preventive services including:    Pneumococcal vaccine   Prevnar 13  Influenza vaccine  Td vaccine  Screening electrocardiogram  Bone densitometry screening  Colorectal cancer screening  Diabetes screening  Glaucoma screening  Nutrition counseling   Advanced directives: requested   Subjective:  [name] [name] is a 86 y.o. female with hx of MI with 4V CABG in 2017 who presents for Medicare Annual Wellness Visit and 3 month follow up on htn, hyperlipidemia, T2DM, CKD, vitamin D deficiency.   She has mild dementia previously managed by neurology Dr. Delice Lesch, now following at our office.   She follows up with son today  Walks with cane outside of home, rails in home where needed  by family  She is living by herself  since husband passed 10/2018, family is very close and checks daily  No longer driving, family prepares food, she can heat up in microwave No falls, denies needs, mood fair with lexapro 5 mg   BMI is Body mass index is 24.17 kg/m., she has not been working on diet and exercise. Wt Readings from Last 3 Encounters:  02/15/19 130 lb (59 kg)  12/01/18 125 lb 6.4 oz (56.9 kg)  11/26/18 123 lb 3.2 oz (55.9 kg)   She has aortic atherosclerosis per CT chest 2017 She has hx of CAD with CABG x 4 in 04/2015; on imdur; follows with Dr. Marlou Porch.  She has mild aortic stenosis/ moderate aortic regurgitation with normal EF per ECHO 2020.   Her blood pressure has been controlled at home, today their BP is BP: 140/74 She does not workout. She denies chest pain, shortness of breath, dizziness.   She is on cholesterol medication (atorvastatin 80 mg daily) and denies myalgias. Her cholesterol is at goal. The cholesterol last visit was:   Lab Results  Component Value Date   CHOL 121 11/11/2018   HDL 52 11/11/2018   LDLCALC 56 11/11/2018   TRIG 57 11/11/2018   CHOLHDL 2.3 11/11/2018    She has not been working on diet and exercise for T2 diabetes controlled on metformin 500 mg BID, and denies foot ulcerations, hyperglycemia, hypoglycemia , increased appetite, nausea, polydipsia, polyuria, visual disturbances, vomiting and weight loss. Checks fasting glucose occasionally, averages 120-130. Last A1C in the office was:  Lab Results  Component Value Date   HGBA1C 6.2 (H) 11/11/2018   She has hx of thyroid disease but has been borderline hyperthyroid, not on medications, and has been monitored closely:    Lab Results  Component Value Date   TSH 0.57 11/11/2018   She has CKD II associated with T2DM:  Lab Results  Component Value Date   GFRNONAA 64 11/11/2018   Patient has hx of Vit D deficiency on supplement and at goal at most recent check:   Lab Results  Component Value Date   VD25OH 61 11/11/2018          Medication Review: Current Outpatient Medications on File Prior to Visit  Medication Sig Dispense Refill  . aspirin EC 81 MG tablet Take 1 tablet (81 mg total) by mouth daily. 90 tablet 3  . atorvastatin (LIPITOR) 80 MG tablet TAKE 1 TABLET DAILY FOR CHOLESTEROL 90 tablet 3  . blood glucose meter kit and supplies KIT Dispense based on patient and insurance preference. Check blood sugar 1 time daily-[id number]. 1 each 0  . escitalopram (LEXAPRO) 5 MG tablet Take 1 tablet Daily for Mood 90 tablet 3  . folic acid (FOLVITE) 1 MG tablet Take 1 tablet Daily 90 tablet 3  . isosorbide mononitrate (IMDUR) 30 MG 24 hr tablet Take 1 tablet Daily for Heart 90 tablet 3  . lisinopril (ZESTRIL) 20 MG tablet Take 1 tablet Daily for BP & Diabetic Kidney Protection 90 tablet 3  . metFORMIN (GLUCOPHAGE-XR) 500 MG 24 hr tablet Take 2 tablets 2 x /day with Meals for Diabetes 360 tablet 3  . mirtazapine (REMERON) 15 MG tablet TAKE 1 TABLET ONE HOUR BEFORE SLEEP 90 tablet 1  . nitroGLYCERIN (NITROSTAT) 0.4 MG SL tablet Dissolve 1 tablet under tongue every 3 to 5 minutes if needed for ChestPain 50 tablet 12  . ondansetron (ZOFRAN ODT) 8 MG disintegrating tablet Dissolve 1 tablet under tongue  every 6 hours for nausea  or vomitting 30 tablet 0  . senna ([employer] SENNA) 8.6 MG tablet Take 1 tablet at Bedtime for Constipation 90 tablet 3   No current facility-administered medications on file prior to visit.    Allergies  Allergen Reactions  . Bee Venom Anaphylaxis and Other (See Comments)    Eyes swell, cannot breathe  . Persantine [Dipyridamole] Other (See Comments)    "went crazy"  . Actos [Pioglitazone] Other (See Comments)    UNSPECIFIED REACTION     Current Problems (verified) Patient Active Problem List   Diagnosis Date Noted  . Aortic atherosclerosis ([place]) 02/04/2019  . Anemia   . History of ischemic cardiomyopathy 06/24/2018  . Type 2 diabetes mellitus ([place]) 04/07/2017  . Mild dementia ([place])  07/26/2016  . S/P CABG x 4   . CAD (coronary artery disease), native coronary artery 11/16/2015  . Medication management 07/27/2013  . Vitamin D deficiency 02/01/2013  . GERD (gastroesophageal reflux disease)   . Hypothyroidism   . CKD stage 2 due to type 2 diabetes mellitus ([place]) 06/04/2007  . Hyperlipidemia associated with type 2 diabetes mellitus ([place]) 06/04/2007  . BMI 23.0-23.9, adult 06/04/2007  . Essential hypertension 06/04/2007    Screening Tests Immunization History  Administered Date(s) Administered  . Influenza Inj Mdck Quad Pf 10/31/2016  . Influenza Inj Mdck Quad With Preservative 11/11/2018  . Influenza Split 10/03/2014  . Influenza,inj,quad, With Preservative 10/31/2015  . Influenza-Unspecified 11/18/2012  . Pneumococcal Conjugate-13 11/04/2013  . Pneumococcal Polysaccharide-23 07/25/2010  . Pneumococcal-Unspecified 02/01/2001  . Td 02/01/2006, 04/09/2017   Preventative care: Last colonoscopy: 2014 Last mammogram: 2007 - declines Last pap smear/pelvic exam: 2012, DONE DEXA: 2015 late osteopenia - declines  Prior vaccinations: TD or Tdap: 2019  Influenza: 11/2018 Pneumococcal: 2003, 2012 Prevnar13: 2015 Shingles/Zostavax: Declined due to cost  Names of Other Physician/Practitioners you currently use: 1. [facility]- here for primary care 2. Dr. Katy Fitch, eye doctor, last visit 10/29/2017 report confirmed and abstracted, reports had in 2020, report requested  3. Does not see, dentist, last visit unknown - full dentures  Patient Care Team: [name], MD as PCP - General (Internal Medicine) Jerline Pain, MD as PCP - Cardiology (Cardiology) Warden Fillers, MD as Consulting Physician (Optometry) Stanford Breed, Denice Bors, MD as Consulting Physician (Cardiology) Sable Feil, MD as Consulting Physician (Gastroenterology)  SURGICAL HISTORY She  has a past surgical history that includes Dilation and curettage of  uterus (1984); Breast biopsy (Left, 1999); Colonoscopy (05/2012); Cardiac catheterization (N/A, 11/15/2015); TEE without cardioversion (N/A, 11/16/2015); and Coronary artery bypass graft (11/16/2015). FAMILY HISTORY Her family history includes Breast cancer in her maternal aunt and maternal grandmother; Diabetes in her father; Lung cancer in her brother; Prostate cancer in her brother; Stroke in her mother; Throat cancer in her maternal uncle. SOCIAL HISTORY She  reports that she has never smoked. Her smokeless tobacco use includes snuff. She reports that she does not drink alcohol or use drugs.   MEDICARE WELLNESS OBJECTIVES: Physical activity: Exercise limited by: None identified Cardiac risk factors: Cardiac Risk Factors include: advanced age (>25mn, >>24women);dyslipidemia;hypertension;diabetes mellitus;sedentary lifestyle Depression/mood screen:   [facility]  Decreased Interest 0  Down, Depressed, Hopeless 1  PHQ - 2 Score 1  Altered sleeping -  Tired, decreased energy -  Change in appetite -  Feeling bad or failure about yourself  -  Trouble concentrating -  Moving slowly or fidgety/restless -  Suicidal  thoughts -  PHQ-9 Score -  Difficult doing work/chores -    ADLs:  In your present state of health, do you have any difficulty performing the following activities: 02/15/2019 11/14/2018  Hearing? N N  Vision? N N  Difficulty concentrating or making decisions? Y N  Comment chronic, slow progression, family assists -  Walking or climbing stairs? N N  Comment walks with cane outside of house -  Dressing or bathing? N N  Doing errands, shopping? Y N  Comment family drives when needed -  Preparing Food and eating ? N -  Using the Toilet? N -  In the past six months, have you accidently leaked urine? N -  Do you have problems with loss of bowel control? N -  Managing your Medications? Y -  Comment family assists -  Managing your Finances? Y -  Comment  family assists -  Housekeeping or managing your Housekeeping? Y -  Comment family assists -  Some recent data might be hidden     Cognitive Testing  Alert? Yes  Normal Appearance?Yes  Oriented to person? Yes  Place? Yes   Time? Yes  Recall of three objects?  No - 1/3  Can perform simple calculations? Yes  Displays appropriate judgment?Yes  Can read the correct time from a watch face?Yes  EOL planning: Does Patient Have a Medical Advance Directive?: No Would patient like information on creating a medical advance directive?: Yes (MAU/Ambulatory/Procedural Areas - Information given)  Review of Systems  Constitutional: Negative for malaise/fatigue and weight loss.  HENT: Negative for hearing loss and tinnitus.   Eyes: Negative for blurred vision and double vision.  Respiratory: Negative for cough, sputum production, shortness of breath and wheezing.   Cardiovascular: Negative for chest pain, palpitations, orthopnea, claudication, leg swelling and PND.  Gastrointestinal: Negative for abdominal pain, blood in stool, constipation, diarrhea, heartburn, melena, nausea and vomiting.  Genitourinary: Negative.   Musculoskeletal: Negative for falls, joint pain and myalgias.  Skin: Negative for rash.  Neurological: Negative for dizziness, tingling, sensory change, weakness and headaches.  Endo/Heme/Allergies: Negative for polydipsia.  Psychiatric/Behavioral: Negative.  Negative for depression, memory loss, substance abuse and suicidal ideas. The patient is not nervous/anxious and does not have insomnia.   All other systems reviewed and are negative.    Objective:     Today's Vitals   02/15/19 1414  BP: 140/74  Pulse: 89  Temp: (!) 97.3 F (36.3 C)  SpO2: 97%  Weight: 130 lb (59 kg)   Body mass index is 24.17 kg/m.  General appearance: alert, no distress, WD/WN, female HEENT: normocephalic, sclerae anicteric, TMs pearly, nares patent, no discharge or erythema, pharynx normal Oral  cavity: MMM, no lesions Neck: supple, no lymphadenopathy, no thyromegaly, no masses Heart: RRR, normal S1, S2, 3/6 systolic blowing murmur (has discussed with cardiology) Lungs: CTA bilaterally, no wheezes, rhonchi, or rales Abdomen: +bs, soft, non tender, non distended, no masses, no hepatomegaly, no splenomegaly Musculoskeletal: nontender, no swelling, no obvious deformity Extremities: no edema, no cyanosis, no clubbing Pulses: 2+ symmetric, upper and lower extremities, normal cap refill Neurological: alert, oriented x 3, CN2-12 intact, strength normal upper extremities and lower extremities, sensation normal throughout, DTRs 2+ throughout, no cerebellar signs, gait slow but normal Psychiatric: normal affect, behavior normal, pleasant   Medicare Attestation I have personally reviewed: The patient's medical and social history Their use of alcohol, tobacco or illicit drugs Their current medications and supplements The patient's functional ability including ADLs,fall risks, home safety  risks, cognitive, and hearing and visual impairment Diet and physical activities Evidence for depression or mood disorders  The patient's weight, height, BMI, and visual acuity have been recorded in the chart.  I have made referrals, counseling, and provided education to the patient based on review of the above and I have provided the patient with a written personalized care plan for preventive services.     Izora Ribas, NP   02/15/2019

## 2019-02-15 ENCOUNTER — Encounter: Payer: Self-pay | Admitting: Adult Health

## 2019-02-15 ENCOUNTER — Other Ambulatory Visit: Payer: Self-pay

## 2019-02-15 ENCOUNTER — Ambulatory Visit (INDEPENDENT_AMBULATORY_CARE_PROVIDER_SITE_OTHER): Payer: Medicare Other | Admitting: Adult Health

## 2019-02-15 VITALS — BP 140/74 | HR 89 | Temp 97.3°F | Wt 130.0 lb

## 2019-02-15 DIAGNOSIS — K219 Gastro-esophageal reflux disease without esophagitis: Secondary | ICD-10-CM | POA: Diagnosis not present

## 2019-02-15 DIAGNOSIS — Z8679 Personal history of other diseases of the circulatory system: Secondary | ICD-10-CM

## 2019-02-15 DIAGNOSIS — E785 Hyperlipidemia, unspecified: Secondary | ICD-10-CM

## 2019-02-15 DIAGNOSIS — E1122 Type 2 diabetes mellitus with diabetic chronic kidney disease: Secondary | ICD-10-CM

## 2019-02-15 DIAGNOSIS — F03A0 Unspecified dementia, mild, without behavioral disturbance, psychotic disturbance, mood disturbance, and anxiety: Secondary | ICD-10-CM

## 2019-02-15 DIAGNOSIS — Z6824 Body mass index (BMI) 24.0-24.9, adult: Secondary | ICD-10-CM

## 2019-02-15 DIAGNOSIS — E1169 Type 2 diabetes mellitus with other specified complication: Secondary | ICD-10-CM

## 2019-02-15 DIAGNOSIS — I700 Atherosclerosis of aorta: Secondary | ICD-10-CM

## 2019-02-15 DIAGNOSIS — Z951 Presence of aortocoronary bypass graft: Secondary | ICD-10-CM

## 2019-02-15 DIAGNOSIS — I10 Essential (primary) hypertension: Secondary | ICD-10-CM

## 2019-02-15 DIAGNOSIS — E559 Vitamin D deficiency, unspecified: Secondary | ICD-10-CM

## 2019-02-15 DIAGNOSIS — E039 Hypothyroidism, unspecified: Secondary | ICD-10-CM

## 2019-02-15 DIAGNOSIS — I25118 Atherosclerotic heart disease of native coronary artery with other forms of angina pectoris: Secondary | ICD-10-CM

## 2019-02-15 DIAGNOSIS — Z0000 Encounter for general adult medical examination without abnormal findings: Secondary | ICD-10-CM

## 2019-02-15 DIAGNOSIS — F0390 Unspecified dementia without behavioral disturbance: Secondary | ICD-10-CM

## 2019-02-15 DIAGNOSIS — D6489 Other specified anemias: Secondary | ICD-10-CM

## 2019-02-15 DIAGNOSIS — Z79899 Other long term (current) drug therapy: Secondary | ICD-10-CM

## 2019-02-15 DIAGNOSIS — N182 Chronic kidney disease, stage 2 (mild): Secondary | ICD-10-CM

## 2019-02-15 MED ORDER — NITROGLYCERIN 0.4 MG SL SUBL: SUBLINGUAL_TABLET | SUBLINGUAL | 12 refills | Status: DC

## 2019-02-15 NOTE — Patient Instructions (Signed)
  Ms. First , Thank you for taking time to come for your Medicare Wellness Visit. I appreciate your ongoing commitment to your health goals. Please review the following plan we discussed and let me know if I can assist you in the future.   These are the goals we discussed: Goals    . Blood Pressure < 140/90     110/60+ ; monitor daily    . DIET - INCREASE WATER INTAKE     Set out bottles of water daily for patient to complete- work up to at least 3-4 bottles daily     . Exercise 3x per week (15-20 min per time)       This is a list of the screening recommended for you and due dates:  Health Maintenance  Topic Date Due  . Eye exam for diabetics  10/30/2018  . Hemoglobin A1C  05/11/2019  . Complete foot exam   11/11/2019  . Tetanus Vaccine  04/10/2027  . Flu Shot  Completed  . DEXA scan (bone density measurement)  Completed  . Pneumonia vaccines  Completed

## 2019-02-16 LAB — LIPID PANEL
Cholesterol: 138 mg/dL (ref <200)
HDL: 50 mg/dL (ref >=50)
LDL Cholesterol (Calc): 71 mg/dL (calc)
Non-HDL Cholesterol (Calc): 88 mg/dL (calc) (ref <130)
Total CHOL/HDL Ratio: 2.8 (calc) (ref <5.0)
Triglycerides: 88 mg/dL (ref <150)

## 2019-02-16 LAB — CBC WITH DIFFERENTIAL/PLATELET
Absolute Monocytes: 586 cells/uL (ref 200–950)
Basophils Absolute: 21 cells/uL (ref 0–200)
Basophils Relative: 0.5 %
Eosinophils Absolute: 8 cells/uL — ABNORMAL LOW (ref 15–500)
Eosinophils Relative: 0.2 %
HCT: 32.1 % — ABNORMAL LOW (ref 35.0–45.0)
Hemoglobin: 10.6 g/dL — ABNORMAL LOW (ref 11.7–15.5)
Lymphs Abs: 1870 cells/uL (ref 850–3900)
MCH: 29 pg (ref 27.0–33.0)
MCHC: 33 g/dL (ref 32.0–36.0)
MCV: 87.9 fL (ref 80.0–100.0)
MPV: 10.7 fL (ref 7.5–12.5)
Monocytes Relative: 14.3 %
Neutro Abs: 1615 cells/uL (ref 1500–7800)
Neutrophils Relative %: 39.4 %
Platelets: 191 10*3/uL (ref 140–400)
RBC: 3.65 10*6/uL — ABNORMAL LOW (ref 3.80–5.10)
RDW: 13.2 % (ref 11.0–15.0)
Total Lymphocyte: 45.6 %
WBC: 4.1 10*3/uL (ref 3.8–10.8)

## 2019-02-16 LAB — COMPLETE METABOLIC PANEL WITH GFR
AG Ratio: 1.4 (calc) (ref 1.0–2.5)
ALT: 9 U/L (ref 6–29)
AST: 10 U/L (ref 10–35)
Albumin: 4.2 g/dL (ref 3.6–5.1)
Alkaline phosphatase (APISO): 41 U/L (ref 37–153)
BUN: 9 mg/dL (ref 7–25)
CO2: 28 mmol/L (ref 20–32)
Calcium: 9.4 mg/dL (ref 8.6–10.4)
Chloride: 102 mmol/L (ref 98–110)
Creat: 0.78 mg/dL (ref 0.60–0.88)
GFR, Est African American: 80 mL/min/{1.73_m2} (ref >=60)
GFR, Est Non African American: 69 mL/min/{1.73_m2} (ref >=60)
Globulin: 3.1 g/dL (calc) (ref 1.9–3.7)
Glucose, Bld: 166 mg/dL — ABNORMAL HIGH (ref 65–99)
Potassium: 3.6 mmol/L (ref 3.5–5.3)
Sodium: 138 mmol/L (ref 135–146)
Total Bilirubin: 0.5 mg/dL (ref 0.2–1.2)
Total Protein: 7.3 g/dL (ref 6.1–8.1)

## 2019-02-16 LAB — MAGNESIUM: Magnesium: 1.9 mg/dL (ref 1.5–2.5)

## 2019-02-16 LAB — HEMOGLOBIN A1C
Hgb A1c MFr Bld: 6.7 % of total Hgb — ABNORMAL HIGH (ref <5.7)
Mean Plasma Glucose: 146 (calc)
eAG (mmol/L): 8.1 (calc)

## 2019-02-16 LAB — FOLATE RBC: RBC Folate: 1116 ng/mL RBC (ref >280)

## 2019-02-16 LAB — TSH: TSH: 0.91 mIU/L (ref 0.40–4.50)

## 2019-02-16 NOTE — Progress Notes (Signed)
PATIENT IS AWARE OF LAB RESULTS/INSTRUCTIONS. -[name] [name]

## 2019-02-17 ENCOUNTER — Other Ambulatory Visit: Payer: Self-pay | Admitting: Adult Health

## 2019-02-24 ENCOUNTER — Encounter: Payer: Self-pay | Admitting: Internal Medicine

## 2019-05-17 ENCOUNTER — Ambulatory Visit: Payer: Medicare Other | Admitting: Internal Medicine

## 2019-05-25 ENCOUNTER — Encounter: Payer: Self-pay | Admitting: Adult Health

## 2019-05-25 ENCOUNTER — Other Ambulatory Visit: Payer: Self-pay

## 2019-05-25 ENCOUNTER — Ambulatory Visit (INDEPENDENT_AMBULATORY_CARE_PROVIDER_SITE_OTHER): Payer: Medicare Other | Admitting: Adult Health

## 2019-05-25 VITALS — BP 140/68 | HR 83 | Temp 96.4°F | Ht 61.5 in | Wt 134.0 lb

## 2019-05-25 DIAGNOSIS — E559 Vitamin D deficiency, unspecified: Secondary | ICD-10-CM

## 2019-05-25 DIAGNOSIS — E039 Hypothyroidism, unspecified: Secondary | ICD-10-CM

## 2019-05-25 DIAGNOSIS — E785 Hyperlipidemia, unspecified: Secondary | ICD-10-CM

## 2019-05-25 DIAGNOSIS — I10 Essential (primary) hypertension: Secondary | ICD-10-CM | POA: Diagnosis not present

## 2019-05-25 DIAGNOSIS — Z6824 Body mass index (BMI) 24.0-24.9, adult: Secondary | ICD-10-CM

## 2019-05-25 DIAGNOSIS — N182 Chronic kidney disease, stage 2 (mild): Secondary | ICD-10-CM

## 2019-05-25 DIAGNOSIS — I25118 Atherosclerotic heart disease of native coronary artery with other forms of angina pectoris: Secondary | ICD-10-CM | POA: Diagnosis not present

## 2019-05-25 DIAGNOSIS — F03A0 Unspecified dementia, mild, without behavioral disturbance, psychotic disturbance, mood disturbance, and anxiety: Secondary | ICD-10-CM

## 2019-05-25 DIAGNOSIS — I700 Atherosclerosis of aorta: Secondary | ICD-10-CM | POA: Diagnosis not present

## 2019-05-25 DIAGNOSIS — F0390 Unspecified dementia without behavioral disturbance: Secondary | ICD-10-CM

## 2019-05-25 DIAGNOSIS — Z951 Presence of aortocoronary bypass graft: Secondary | ICD-10-CM

## 2019-05-25 DIAGNOSIS — E1169 Type 2 diabetes mellitus with other specified complication: Secondary | ICD-10-CM

## 2019-05-25 DIAGNOSIS — E1122 Type 2 diabetes mellitus with diabetic chronic kidney disease: Secondary | ICD-10-CM | POA: Diagnosis not present

## 2019-05-25 DIAGNOSIS — Z79899 Other long term (current) drug therapy: Secondary | ICD-10-CM

## 2019-05-25 MED ORDER — SENNOSIDES 8.6 MG PO TABS: ORAL_TABLET | ORAL | 3 refills | Status: DC

## 2019-05-25 NOTE — Patient Instructions (Addendum)
Goals    . Blood Pressure < 140/90     110/60+ ; monitor daily    . DIET - INCREASE WATER INTAKE     Set out bottles of water daily for patient to complete- work up to at least 3-4 bottles daily     . Exercise 3x per week (15-20 min per time)        Please check fasting blood sugar once a week or a few times a month, or if she is feeling unwell    Did she get diabetes eye exam? Missing report for this year -   Recommend getting the covid 19 vaccine  - mild SE risk compared to high mortality risk at her age -      Blood Glucose Monitoring, Adult Monitoring your blood sugar (glucose) is an important part of managing your diabetes (diabetes mellitus). Blood glucose monitoring involves checking your blood glucose as often as directed and keeping a record (log) of your results over time. Checking your blood glucose regularly and keeping a blood glucose log can:  Help you and your health care provider adjust your diabetes management plan as needed, including your medicines or insulin.  Help you understand how food, exercise, illnesses, and medicines affect your blood glucose.  Let you know what your blood glucose is at any time. You can quickly find out if you have low blood glucose (hypoglycemia) or high blood glucose (hyperglycemia). Your health care provider will set individualized treatment goals for you. Your goals will be based on your age, other medical conditions you have, and how you respond to diabetes treatment. Generally, the goal of treatment is to maintain the following blood glucose levels:  Before meals (preprandial): 80-130 mg/dL (4.4-7.2 mmol/L).  After meals (postprandial): below 180 mg/dL (10 mmol/L).  A1c level: less than 7%. Supplies needed:  Blood glucose meter.  Test strips for your meter. Each meter has its own strips. You must use the strips that came with your meter.  A needle to prick your finger (lancet). Do not use a lancet more than one time.  A  device that holds the lancet (lancing device).  A journal or log book to write down your results. How to check your blood glucose  1. Wash your hands with soap and water. 2. Prick the side of your finger (not the tip) with the lancet. Use a different finger each time. 3. Gently rub the finger until a small drop of blood appears. 4. Follow instructions that come with your meter for inserting the test strip, applying blood to the strip, and using your blood glucose meter. 5. Write down your result and any notes. Some meters allow you to use areas of your body other than your finger (alternative sites) to test your blood. The most common alternative sites are:  Forearm.  Thigh.  Palm of the hand. If you think you may have hypoglycemia, or if you have a history of not knowing when your blood glucose is getting low (hypoglycemia unawareness), do not use alternative sites. Use your finger instead. Alternative sites may not be as accurate as the fingers, because blood flow is slower in these areas. This means that the result you get may be delayed, and it may be different from the result that you would get from your finger. Follow these instructions at home: Blood glucose log   Every time you check your blood glucose, write down your result. Also write down any notes about things that may be  affecting your blood glucose, such as your diet and exercise for the day. This information can help you and your health care provider: ? Look for patterns in your blood glucose over time. ? Adjust your diabetes management plan as needed.  Check if your meter allows you to download your records to a computer. Most glucose meters store a record of glucose readings in the meter. If you have type 1 diabetes:  Check your blood glucose 2 or more times a day.  Also check your blood glucose: ? Before every insulin injection. ? Before and after exercise. ? Before meals. ? 2 hours after a meal. ? Occasionally  between 2:00 a.m. and 3:00 a.m., as directed. ? Before potentially dangerous tasks, like driving or using heavy machinery. ? At bedtime.  You may need to check your blood glucose more often, up to 6-10 times a day, if you: ? Use an insulin pump. ? Need multiple daily injections (MDI). ? Have diabetes that is not well-controlled. ? Are ill. ? Have a history of severe hypoglycemia. ? Have hypoglycemia unawareness. If you have type 2 diabetes:  If you take insulin or other diabetes medicines, check your blood glucose 2 or more times a day.  If you are on intensive insulin therapy, check your blood glucose 4 or more times a day. Occasionally, you may also need to check between 2:00 a.m. and 3:00 a.m., as directed.  Also check your blood glucose: ? Before and after exercise. ? Before potentially dangerous tasks, like driving or using heavy machinery.  You may need to check your blood glucose more often if: ? Your medicine is being adjusted. ? Your diabetes is not well-controlled. ? You are ill. General tips  Always keep your supplies with you.  If you have questions or need help, all blood glucose meters have a 24-hour "hotline" phone number that you can call. You may also contact your health care provider.  After you use a few boxes of test strips, adjust (calibrate) your blood glucose meter by following instructions that came with your meter. Contact a health care provider if:  Your blood glucose is at or above 240 mg/dL (13.3 mmol/L) for 2 days in a row.  You have been sick or have had a fever for 2 days or longer, and you are not getting better.  You have any of the following problems for more than 6 hours: ? You cannot eat or drink. ? You have nausea or vomiting. ? You have diarrhea. Get help right away if:  Your blood glucose is lower than 54 mg/dL (3 mmol/L).  You become confused or you have trouble thinking clearly.  You have difficulty breathing.  You have  moderate or large ketone levels in your urine. Summary  Monitoring your blood sugar (glucose) is an important part of managing your diabetes (diabetes mellitus).  Blood glucose monitoring involves checking your blood glucose as often as directed and keeping a record (log) of your results over time.  Your health care provider will set individualized treatment goals for you. Your goals will be based on your age, other medical conditions you have, and how you respond to diabetes treatment.  Every time you check your blood glucose, write down your result. Also write down any notes about things that may be affecting your blood glucose, such as your diet and exercise for the day. This information is not intended to replace advice given to you by your health care provider. Make sure you discuss  any questions you have with your health care provider. Document Revised: 10/17/2017 Document Reviewed: 06/05/2015 Elsevier Patient Education  [place].  Exercises To Do While Sitting  Exercises that you do while sitting (chair exercises) can give you many of the same benefits as full exercise. Benefits include strengthening your heart, burning calories, and keeping muscles and joints healthy. Exercise can also improve your mood and help with depression and anxiety. You may benefit from chair exercises if you are unable to do standing exercises because of:  Diabetic foot pain.  Obesity.  Illness.  Arthritis.  Recovery from surgery or injury.  Breathing problems.  Balance problems.  Another type of disability. Before starting chair exercises, check with your health care provider or a physical therapist to find out how much exercise you can tolerate and which exercises are safe for you. If your health care provider approves:  Start out slowly and build up over time. Aim to work up to about 10-20 minutes for each exercise session.  Make exercise part of your daily routine.  Drink water  when you exercise. Do not wait until you are thirsty. Drink every 10-15 minutes.  Stop exercising right away if you have pain, nausea, shortness of breath, or dizziness.  If you are exercising in a wheelchair, make sure to lock the wheels.  Ask your health care provider whether you can do tai chi or yoga. Many positions in these mind-body exercises can be modified to do while seated. Warm-up Before starting other exercises: 1. Sit up as straight as you can. Have your knees bent at 90 degrees, which is the shape of the capital letter "L." Keep your feet flat on the floor. 2. Sit at the front edge of your chair, if you can. 3. Pull in (tighten) the muscles in your abdomen and stretch your spine and neck as straight as you can. Hold this position for a few minutes. 4. Breathe in and out evenly. Try to concentrate on your breathing, and relax your mind. Stretching Exercise A: Arm stretch 1. Hold your arms out straight in front of your body. 2. Bend your hands at the wrist with your fingers pointing up, as if signaling someone to stop. Notice the slight tension in your forearms as you hold the position. 3. Keeping your arms out and your hands bent, rotate your hands outward as far as you can and hold this stretch. Aim to have your thumbs pointing up and your pinkie fingers pointing down. Slowly repeat arm stretches for one minute as tolerated. Exercise B: Leg stretch 1. If you can move your legs, try to "draw" letters on the floor with the toes of your foot. Write your name with one foot. 2. Write your name with the toes of your other foot. Slowly repeat the movements for one minute as tolerated. Exercise C: Reach for the sky 1. Reach your hands as far over your head as you can to stretch your spine. 2. Move your hands and arms as if you are climbing a rope. Slowly repeat the movements for one minute as tolerated. Range of motion exercises Exercise A: Shoulder roll 1. Let your arms hang  loosely at your sides. 2. Lift just your shoulders up toward your ears, then let them relax back down. 3. When your shoulders feel loose, rotate your shoulders in backward and forward circles. Do shoulder rolls slowly for one minute as tolerated. Exercise B: March in place 1. As if you are marching, pump your arms  and lift your legs up and down. Lift your knees as high as you can. ? If you are unable to lift your knees, just pump your arms and move your ankles and feet up and down. March in place for one minute as tolerated. Exercise C: Seated jumping jacks 1. Let your arms hang down straight. 2. Keeping your arms straight, lift them up over your head. Aim to point your fingers to the ceiling. 3. While you lift your arms, straighten your legs and slide your heels along the floor to your sides, as wide as you can. 4. As you bring your arms back down to your sides, slide your legs back together. ? If you are unable to use your legs, just move your arms. Slowly repeat seated jumping jacks for one minute as tolerated. Strengthening exercises Exercise A: Shoulder squeeze 1. Hold your arms straight out from your body to your sides, with your elbows bent and your fists pointed at the ceiling. 2. Keeping your arms in the bent position, move them forward so your elbows and forearms meet in front of your face. 3. Open your arms back out as wide as you can with your elbows still bent, until you feel your shoulder blades squeezing together. Hold for 5 seconds. Slowly repeat the movements forward and backward for one minute as tolerated. Contact a health care provider if you:  Had to stop exercising due to any of the following: ? Pain. ? Nausea. ? Shortness of breath. ? Dizziness. ? Fatigue.  Have significant pain or soreness after exercising. Get help right away if you have:  Chest pain.  Difficulty breathing. These symptoms may represent a serious problem that is an emergency. Do not wait to  see if the symptoms will go away. Get medical help right away. Call your local emergency services (911 in the [place] Do not drive yourself to the hospital. This information is not intended to replace advice given to you by your health care provider. Make sure you discuss any questions you have with your health care provider. Document Revised: 04/16/2018 Document Reviewed: 11/06/2016 Elsevier Patient Education  2020 [employer].

## 2019-05-25 NOTE — Progress Notes (Signed)
6 MONTH FOLLOW UP  Assessment:   Diagnoses and all orders for this visit:  Essential hypertension Continue medications Monitor blood pressure at home; call if consistently over 130/80 Continue DASH diet.   Reminder to go to the ER if any CP, SOB, nausea, dizziness, severe HA, changes vision/speech, left arm numbness and tingling and jaw pain.  Atherosclerosis of aorta Per CT chest 2017 Control blood pressure, cholesterol, glucose, increase exercise.   Coronary artery disease involving coronary bypass graft with other forms of angina pectoris, unspecified whether native or transplanted heart [facility]) S/p CABG x 4, no further chest pain on imdur Control blood pressure, cholesterol, glucose, increase exercise.  Followed by cardiology   Acquired hypothyroidism Currently borderline hyperthyroid off of medications with monitoring only Check TSH, refer as indicated for follow up if getting worse  Type 2 diabetes mellitus with stage 2 chronic kidney disease, without long-term current use of insulin [facility]) Education: Reviewed 'ABCs' of diabetes management (respective goals in parentheses):  A1C (<7), blood pressure (<130/80), and cholesterol (LDL <70) Eye Exam yearly (verified currently up to date) and Dental Exam every 6 months. Dietary recommendations Physical Activity recommendations  CKD stage 2 due to type 2 diabetes mellitus (HCC) Increase fluids, avoid NSAIDS, monitor sugars, will monitor CMP/GFR  Mild dementia Monitoring only; independent for most ADLs, driven by son/daughter in law with some supervision for medication management and bills Control blood pressure, cholesterol, glucose, emphasized need for increased exercise.  Followed by Dr. Delice Lesch  Vitamin D deficiency Continue supplementation  BMI 24  Long discussion about weight loss, diet, and exercise Recommended diet heavy in fruits and veggies and low in animal meats, cheeses, and dairy products, appropriate calorie  intake Discussed appropriate weight for height Follow up at next visit  Medication management CBC, CMP/GFR  Hyperlipidemia associated with T2DM Continue medications: atorvastatin  LDL goal <70 Continue low cholesterol diet and exercise.  Check lipid panel.   Over 40 minutes of exam, counseling, chart review and critical decision making was performed Future Appointments  Date Time Provider [place]  12/16/2019  2:00 PM [name], MD [facility] None       Subjective:  [name] [name] is a 86 y.o. female with hx of MI with 4V CABG in 2017 who presents for 3 month follow up on htn, hyperlipidemia, T2DM, CKD, vitamin D deficiency.   She has mild dementia previously managed by neurology Dr. Delice Lesch, now following at our office.   She follows up with son today  Walks with cane outside of home, rails in home where needed by family  She is living by herself since husband passed 10/2018, family is very close and checks daily  No longer driving, family prepares food, she can heat up in microwave No falls since backing off of BP medication, denies needs, mood fair with lexapro 5 mg   GERD recently controlled off of medications.   BMI is Body mass index is 24.91 kg/m., she has not been working on diet and exercise. Son has been trying to encourage her to increase fluid intake, drinking watar.  Wt Readings from Last 3 Encounters:  05/25/19 134 lb (60.8 kg)  02/15/19 130 lb (59 kg)  12/01/18 125 lb 6.4 oz (56.9 kg)   She has aortic atherosclerosis per CT chest 2017 She has hx of CAD with CABG x 4 in 04/2015; on imdur; follows with Dr. Marlou Porch.  She has mild aortic stenosis/ moderate aortic regurgitation with normal EF per ECHO 2020.  Her blood pressure has been controlled at home (125-140s/60-70s), today their BP is BP: 140/68 She does not workout. She denies chest pain, shortness of breath, dizziness.   She is on cholesterol medication (atorvastatin 80 mg daily) and  denies myalgias. Her cholesterol is at goal. The cholesterol last visit was:   Lab Results  Component Value Date   CHOL 138 02/15/2019   HDL 50 02/15/2019   LDLCALC 71 02/15/2019   TRIG 88 02/15/2019   CHOLHDL 2.8 02/15/2019    She has not been working on diet and exercise for T2 diabetes controlled on metformin 500 mg daily, and denies foot ulcerations, hyperglycemia, hypoglycemia , increased appetite, nausea, polydipsia, polyuria, visual disturbances, vomiting and weight loss. Admits lost glucomerter that was just ordered last November, hasn't been checking glucose, but son plans to pick up a new one and he will keep so prevent losing. Last A1C in the office was:  Lab Results  Component Value Date   HGBA1C 6.7 (H) 02/15/2019   She has hx of thyroid disease but has been borderline hyperthyroid, not on medications, and has been monitored closely:    Lab Results  Component Value Date   TSH 0.91 02/15/2019   She has CKD II associated with T2DM:  Lab Results  Component Value Date   GFRNONAA 69 02/15/2019   Patient has hx of Vit D deficiency on supplement and at goal at most recent check:   Lab Results  Component Value Date   VD25OH 61 11/11/2018        Medication Review: Current Outpatient Medications on File Prior to Visit  Medication Sig Dispense Refill  . aspirin EC 81 MG tablet Take 1 tablet (81 mg total) by mouth daily. 90 tablet 3  . atorvastatin (LIPITOR) 80 MG tablet TAKE 1 TABLET DAILY FOR CHOLESTEROL 90 tablet 3  . blood glucose meter kit and supplies KIT Dispense based on patient and insurance preference. Check blood sugar 1 time daily-[id number]. 1 each 0  . escitalopram (LEXAPRO) 5 MG tablet Take 1 tablet Daily for Mood 90 tablet 3  . isosorbide mononitrate (IMDUR) 30 MG 24 hr tablet Take 1 tablet Daily for Heart 90 tablet 3  . lisinopril (ZESTRIL) 20 MG tablet Take 1 tablet Daily for BP & Diabetic Kidney Protection 90 tablet 3  . metFORMIN (GLUCOPHAGE-XR) 500 MG 24  hr tablet Take 2 tablets 2 x /day with Meals for Diabetes 360 tablet 3  . mirtazapine (REMERON) 15 MG tablet TAKE 1 TABLET ONE HOUR BEFORE SLEEP 90 tablet 1  . nitroGLYCERIN (NITROSTAT) 0.4 MG SL tablet Dissolve 1 tablet under tongue every 3 to 5 minutes if needed for ChestPain 50 tablet 12  . senna ([employer] SENNA) 8.6 MG tablet Take 1 tablet at Bedtime for Constipation 90 tablet 3   No current facility-administered medications on file prior to visit.    Allergies  Allergen Reactions  . Bee Venom Anaphylaxis and Other (See Comments)    Eyes swell, cannot breathe  . Persantine [Dipyridamole] Other (See Comments)    "went crazy"  . Actos [Pioglitazone] Other (See Comments)    UNSPECIFIED REACTION     Current Problems (verified) Patient Active Problem List   Diagnosis Date Noted  . Aortic atherosclerosis ([place]) 02/04/2019  . Anemia   . History of ischemic cardiomyopathy 06/24/2018  . Type 2 diabetes mellitus ([place]) 04/07/2017  . Mild dementia ([place]) 07/26/2016  . S/P CABG x 4   . CAD (coronary artery disease), native coronary artery  11/16/2015  . Medication management 07/27/2013  . Vitamin D deficiency 02/01/2013  . GERD (gastroesophageal reflux disease)   . Hypothyroidism   . CKD stage 2 due to type 2 diabetes mellitus ([place]) 06/04/2007  . Hyperlipidemia associated with type 2 diabetes mellitus ([place]) 06/04/2007  . BMI 24.0-24.9, adult 06/04/2007  . Essential hypertension 06/04/2007    Screening Tests Immunization History  Administered Date(s) Administered  . Influenza Inj Mdck Quad Pf 10/31/2016  . Influenza Inj Mdck Quad With Preservative 11/11/2018  . Influenza Split 10/03/2014  . Influenza,inj,quad, With Preservative 10/31/2015  . Influenza-Unspecified 11/18/2012  . Pneumococcal Conjugate-13 11/04/2013  . Pneumococcal Polysaccharide-23 07/25/2010  . Pneumococcal-Unspecified 02/01/2001  . Td 02/01/2006, 04/09/2017    Patient Care Team: [name], MD as PCP -  General (Internal Medicine) Jerline Pain, MD as PCP - Cardiology (Cardiology) Warden Fillers, MD as Consulting Physician (Optometry) Stanford Breed, Denice Bors, MD as Consulting Physician (Cardiology) Sable Feil, MD as Consulting Physician (Gastroenterology)  SURGICAL HISTORY She  has a past surgical history that includes Dilation and curettage of uterus (1984); Breast biopsy (Left, 1999); Colonoscopy (05/2012); Cardiac catheterization (N/A, 11/15/2015); TEE without cardioversion (N/A, 11/16/2015); and Coronary artery bypass graft (11/16/2015). FAMILY HISTORY Her family history includes Breast cancer in her maternal aunt and maternal grandmother; Diabetes in her father; Lung cancer in her brother; Prostate cancer in her brother; Stroke in her mother; Throat cancer in her maternal uncle. SOCIAL HISTORY She  reports that she has never smoked. Her smokeless tobacco use includes snuff. She reports that she does not drink alcohol or use drugs.   Review of Systems  Constitutional: Negative for malaise/fatigue and weight loss.  HENT: Negative for hearing loss and tinnitus.   Eyes: Negative for blurred vision and double vision.  Respiratory: Negative for cough, sputum production, shortness of breath and wheezing.   Cardiovascular: Negative for chest pain, palpitations, orthopnea, claudication, leg swelling and PND.  Gastrointestinal: Negative for abdominal pain, blood in stool, constipation, diarrhea, heartburn, melena, nausea and vomiting.  Genitourinary: Negative.   Musculoskeletal: Negative for falls, joint pain and myalgias.  Skin: Negative for rash.  Neurological: Negative for dizziness, tingling, sensory change, weakness and headaches.  Endo/Heme/Allergies: Negative for polydipsia.  Psychiatric/Behavioral: Negative.  Negative for depression, memory loss, substance abuse and suicidal ideas. The patient is not nervous/anxious and does not have insomnia.   All other systems reviewed and are  negative.    Objective:     Today's Vitals   05/25/19 1607  BP: 140/68  Pulse: 83  Temp: (!) 96.4 F (35.8 C)  SpO2: 97%  Weight: 134 lb (60.8 kg)  Height: 5' 1.5" (1.562 m)   Body mass index is 24.91 kg/m.  General appearance: alert, no distress, WD/WN, female HEENT: normocephalic, sclerae anicteric, TMs pearly, nares patent, no discharge or erythema, pharynx normal Oral cavity: MMM, no lesions Neck: supple, no lymphadenopathy, no thyromegaly, no masses Heart: RRR, normal S1, S2, 3/6 systolic blowing murmur (has discussed with cardiology) Lungs: CTA bilaterally, no wheezes, rhonchi, or rales Abdomen: +bs, soft, non tender, non distended, no masses, no hepatomegaly, no splenomegaly Musculoskeletal: nontender, no swelling, no obvious deformity Extremities: no edema, no cyanosis, no clubbing Pulses: 2+ symmetric, upper and lower extremities, normal cap refill Neurological: alert, oriented x 3, CN2-12 intact, strength normal upper extremities and lower extremities, sensation normal throughout, DTRs 2+ throughout, no cerebellar signs, gait slow but normal Psychiatric: normal affect, behavior normal, pleasant   Medicare Attestation I have personally reviewed: The patient's  medical and social history Their use of alcohol, tobacco or illicit drugs Their current medications and supplements The patient's functional ability including ADLs,fall risks, home safety risks, cognitive, and hearing and visual impairment Diet and physical activities Evidence for depression or mood disorders  The patient's weight, height, BMI, and visual acuity have been recorded in the chart.  I have made referrals, counseling, and provided education to the patient based on review of the above and I have provided the patient with a written personalized care plan for preventive services.     Izora Ribas, NP   05/25/2019

## 2019-05-26 LAB — CBC WITH DIFFERENTIAL/PLATELET
Absolute Monocytes: 686 cells/uL (ref 200–950)
Basophils Absolute: 9 cells/uL (ref 0–200)
Basophils Relative: 0.2 %
Eosinophils Absolute: 9 cells/uL — ABNORMAL LOW (ref 15–500)
Eosinophils Relative: 0.2 %
HCT: 33.1 % — ABNORMAL LOW (ref 35.0–45.0)
Hemoglobin: 11.3 g/dL — ABNORMAL LOW (ref 11.7–15.5)
Lymphs Abs: 1645 cells/uL (ref 850–3900)
MCH: 30.2 pg (ref 27.0–33.0)
MCHC: 34.1 g/dL (ref 32.0–36.0)
MCV: 88.5 fL (ref 80.0–100.0)
MPV: 10.7 fL (ref 7.5–12.5)
Monocytes Relative: 14.6 %
Neutro Abs: 2350 cells/uL (ref 1500–7800)
Neutrophils Relative %: 50 %
Platelets: 200 10*3/uL (ref 140–400)
RBC: 3.74 10*6/uL — ABNORMAL LOW (ref 3.80–5.10)
RDW: 13.5 % (ref 11.0–15.0)
Total Lymphocyte: 35 %
WBC: 4.7 10*3/uL (ref 3.8–10.8)

## 2019-05-26 LAB — LIPID PANEL
Cholesterol: 124 mg/dL (ref <200)
HDL: 50 mg/dL (ref >=50)
LDL Cholesterol (Calc): 58 mg/dL (calc)
Non-HDL Cholesterol (Calc): 74 mg/dL (calc) (ref <130)
Total CHOL/HDL Ratio: 2.5 (calc) (ref <5.0)
Triglycerides: 81 mg/dL (ref <150)

## 2019-05-26 LAB — TSH: TSH: 0.9 mIU/L (ref 0.40–4.50)

## 2019-05-26 LAB — COMPLETE METABOLIC PANEL WITH GFR
AG Ratio: 1.3 (calc) (ref 1.0–2.5)
ALT: 8 U/L (ref 6–29)
AST: 13 U/L (ref 10–35)
Albumin: 4.3 g/dL (ref 3.6–5.1)
Alkaline phosphatase (APISO): 43 U/L (ref 37–153)
BUN: 8 mg/dL (ref 7–25)
CO2: 26 mmol/L (ref 20–32)
Calcium: 9.3 mg/dL (ref 8.6–10.4)
Chloride: 106 mmol/L (ref 98–110)
Creat: 0.78 mg/dL (ref 0.60–0.88)
GFR, Est African American: 80 mL/min/{1.73_m2} (ref >=60)
GFR, Est Non African American: 69 mL/min/{1.73_m2} (ref >=60)
Globulin: 3.3 g/dL (calc) (ref 1.9–3.7)
Glucose, Bld: 119 mg/dL — ABNORMAL HIGH (ref 65–99)
Potassium: 4.2 mmol/L (ref 3.5–5.3)
Sodium: 139 mmol/L (ref 135–146)
Total Bilirubin: 0.8 mg/dL (ref 0.2–1.2)
Total Protein: 7.6 g/dL (ref 6.1–8.1)

## 2019-05-26 LAB — HEMOGLOBIN A1C
Hgb A1c MFr Bld: 6.7 % of total Hgb — ABNORMAL HIGH (ref <5.7)
Mean Plasma Glucose: 146 (calc)
eAG (mmol/L): 8.1 (calc)

## 2019-05-26 LAB — MAGNESIUM: Magnesium: 1.8 mg/dL (ref 1.5–2.5)

## 2019-07-16 ENCOUNTER — Ambulatory Visit: Payer: Medicare Other | Admitting: Physician Assistant

## 2019-07-18 NOTE — Progress Notes (Signed)
Subjective:    Patient ID: [name] [name], female    DOB: [birth year], 86 y.o.   MRN: [id number]  HPI 86 y.o. WF with history of CKD stage 2 due to DM, hypothyroidism, CAD, IDCM, anemia, HTN, mild dementia presents for lower back pain and dizziness. She has been hospitlizied for hypovolemia in 06/2018.    Her son is here and provides the story. States she was doing well with drinking fluids so he did not push her as much and then the middle of last week she has had lower back pain, dizziness with standing and has more constipation. Last BM last night, has had straining, no bloodin stool.   They have since been pushing fluids.   She has area on her right medial arch of a hardened blister x 2 weeks, no rashes anywhere else, no pain.   Blood pressure 130/74, pulse 68, temperature (!) 97.5 F (36.4 C), weight 137 lb (62.1 kg), SpO2 98 %.  Lab Results  Component Value Date   HGBA1C 6.7 (H) 05/25/2019     Lab Results  Component Value Date   GFRNONAA 69 05/25/2019    Medications  Current Outpatient Medications (Endocrine & Metabolic):  .  metFORMIN (GLUCOPHAGE-XR) 500 MG 24 hr tablet, Take 2 tablets 2 x /day with Meals for Diabetes  Current Outpatient Medications (Cardiovascular):  .  atorvastatin (LIPITOR) 80 MG tablet, TAKE 1 TABLET DAILY FOR CHOLESTEROL .  isosorbide mononitrate (IMDUR) 30 MG 24 hr tablet, Take 1 tablet Daily for Heart .  lisinopril (ZESTRIL) 20 MG tablet, Take 1 tablet Daily for BP & Diabetic Kidney Protection .  nitroGLYCERIN (NITROSTAT) 0.4 MG SL tablet, Dissolve 1 tablet under tongue every 3 to 5 minutes if needed for ChestPain   Current Outpatient Medications (Analgesics):  .  aspirin EC 81 MG tablet, Take 1 tablet (81 mg total) by mouth daily.   Current Outpatient Medications (Other):  .  blood glucose meter kit and supplies KIT, Dispense based on patient and insurance preference. Check blood sugar 1 time daily-DX-E11.22. Marland Kitchen  escitalopram (LEXAPRO) 5 MG  tablet, Take 1 tablet Daily for Mood .  mirtazapine (REMERON) 15 MG tablet, TAKE 1 TABLET ONE HOUR BEFORE SLEEP .  senna ([employer] SENNA) 8.6 MG tablet, Take 1 tablet at Bedtime for Constipation  Problem list She has CKD stage 2 due to type 2 diabetes mellitus ([place]); Hyperlipidemia associated with type 2 diabetes mellitus ([place]); BMI 24.0-24.9, adult; Essential hypertension; GERD (gastroesophageal reflux disease); Hypothyroidism; Vitamin D deficiency; Medication management; CAD (coronary artery disease), native coronary artery; S/P CABG x 4; Mild dementia ([place]); Type 2 diabetes mellitus ([place]); History of ischemic cardiomyopathy; Anemia; and Aortic atherosclerosis (HCC) on their problem list.   Review of Systems See HPI    Objective:   Physical Exam General appearance: alert, no distress, WD/WN, female HEENT: normocephalic, sclerae anicteric, TMs pearly, nares patent, no discharge or erythema, pharynx normal Oral cavity: MMM, no lesions Neck: supple, no lymphadenopathy, no thyromegaly, no masses Heart: RRR, normal S1, S2, 3/6 systolic blowing murmur (has discussed with cardiology) Lungs: CTA bilaterally, no wheezes, rhonchi, or rales Abdomen: +bs, soft, non tender, non distended, no masses, no hepatomegaly, no splenomegaly Musculoskeletal: nontender, no swelling, no obvious deformity Extremities: no edema, no cyanosis, no clubbing Pulses: 2+ symmetric, upper and lower extremities, normal cap refill Neurological: alert, oriented x 3, CN2-12 intact, strength normal upper extremities and lower extremities, sensation normal throughout, DTRs 2+ throughout, no cerebellar signs, gait slow but normal Psychiatric:  normal affect, behavior normal, pleasant   Skin: hard, nontender blister on right foot, no warmth, no other rash.         Assessment & Plan:    Urinary frequency -     Urinalysis, Routine w reflex microscopic -     Urine Culture  Acute midline low back pain without sciatica No rash,  no acute weakness, neg straight leg raise.  Will monitor- if worsening symptoms will refer to ortho/imaging.   Dizziness -     CBC with Differential/Platelet -     COMPLETE METABOLIC PANEL WITH GFR -     TSH' Will check for dehydartion/electrolytes- she has improved with pushing fluids.   Blister No other rashes anywhere else, non tender, will monitor at this time Has close follow up in the office.   Tinea pedis of both feet -     nystatin cream (MYCOSTATIN); Apply 1 application topically 2 (two) times daily.  Medication management -     Magnesium     Future Appointments  Date Time Provider [place]  08/30/2019  3:30 PM Liane Comber, NP [facility] None  12/16/2019  2:00 PM [name], MD [facility] None  05/29/2020  3:00 PM Liane Comber, NP [facility] None

## 2019-07-19 ENCOUNTER — Ambulatory Visit (INDEPENDENT_AMBULATORY_CARE_PROVIDER_SITE_OTHER): Payer: Medicare Other | Admitting: Physician Assistant

## 2019-07-19 ENCOUNTER — Other Ambulatory Visit: Payer: Self-pay

## 2019-07-19 ENCOUNTER — Encounter: Payer: Self-pay | Admitting: Physician Assistant

## 2019-07-19 VITALS — BP 130/74 | HR 68 | Temp 97.5°F | Wt 137.0 lb

## 2019-07-19 DIAGNOSIS — M545 Low back pain, unspecified: Secondary | ICD-10-CM

## 2019-07-19 DIAGNOSIS — R350 Frequency of micturition: Secondary | ICD-10-CM | POA: Diagnosis not present

## 2019-07-19 DIAGNOSIS — B353 Tinea pedis: Secondary | ICD-10-CM

## 2019-07-19 DIAGNOSIS — T148XXA Other injury of unspecified body region, initial encounter: Secondary | ICD-10-CM

## 2019-07-19 DIAGNOSIS — Z79899 Other long term (current) drug therapy: Secondary | ICD-10-CM

## 2019-07-19 DIAGNOSIS — R42 Dizziness and giddiness: Secondary | ICD-10-CM

## 2019-07-19 MED ORDER — NYSTATIN 100000 UNIT/GM EX CREA: 1.0000 "application " | TOPICAL_CREAM | Freq: Two times a day (BID) | CUTANEOUS | 1 refills | Status: DC

## 2019-07-19 NOTE — Patient Instructions (Addendum)
Keep vaseline on her foot Avoid pressure Come back sooner if she has any other rashes, the area gets tender, red, swollen or has a discharge. Please check it daily.   Will send in cream for yeast  INCREASE WATER TO HELP STOOL  YOU CAN DO MIRALAX DAILY   Constipation, Adult Constipation is when a person has fewer bowel movements in a week than normal, has difficulty having a bowel movement, or has stools that are dry, hard, or larger than normal. Constipation may be caused by an underlying condition. It may become worse with age if a person takes certain medicines and does not take in enough fluids. Follow these instructions at home: Eating and drinking   Eat foods that have a lot of fiber, such as fresh fruits and vegetables, whole grains, and beans.  Limit foods that are high in fat, low in fiber, or overly processed, such as french fries, hamburgers, cookies, candies, and soda.  Drink enough fluid to keep your urine clear or pale yellow. General instructions  Exercise regularly or as told by your health care provider.  Go to the restroom when you have the urge to go. Do not hold it in.  Take over-the-counter and prescription medicines only as told by your health care provider. These include any fiber supplements.  Practice pelvic floor retraining exercises, such as deep breathing while relaxing the lower abdomen and pelvic floor relaxation during bowel movements.  Watch your condition for any changes.  Keep all follow-up visits as told by your health care provider. This is important. Contact a health care provider if:  You have pain that gets worse.  You have a fever.  You do not have a bowel movement after 4 days.  You vomit.  You are not hungry.  You lose weight.  You are bleeding from the anus.  You have thin, pencil-like stools. Get help right away if:  You have a fever and your symptoms suddenly get worse.  You leak stool or have blood in your  stool.  Your abdomen is bloated.  You have severe pain in your abdomen.  You feel dizzy or you faint. This information is not intended to replace advice given to you by your health care provider. Make sure you discuss any questions you have with your health care provider. Document Revised: 12/06/2016 Document Reviewed: 06/14/2015 Elsevier Patient Education  2020 [employer].

## 2019-07-20 LAB — URINE CULTURE
MICRO NUMBER:: 10693510
Result:: NO GROWTH
SPECIMEN QUALITY:: ADEQUATE

## 2019-07-20 LAB — URINALYSIS, ROUTINE W REFLEX MICROSCOPIC
Bilirubin Urine: NEGATIVE
Glucose, UA: NEGATIVE
Hgb urine dipstick: NEGATIVE
Ketones, ur: NEGATIVE
Leukocytes,Ua: NEGATIVE
Nitrite: NEGATIVE
Protein, ur: NEGATIVE
Specific Gravity, Urine: 1.007 (ref 1.001–1.03)
pH: 5.5 (ref 5.0–8.0)

## 2019-07-20 LAB — CBC WITH DIFFERENTIAL/PLATELET
Absolute Monocytes: 1029 cells/uL — ABNORMAL HIGH (ref 200–950)
Basophils Absolute: 22 cells/uL (ref 0–200)
Basophils Relative: 0.4 %
Eosinophils Absolute: 28 cells/uL (ref 15–500)
Eosinophils Relative: 0.5 %
HCT: 34 % — ABNORMAL LOW (ref 35.0–45.0)
Hemoglobin: 11.4 g/dL — ABNORMAL LOW (ref 11.7–15.5)
Lymphs Abs: 2118 cells/uL (ref 850–3900)
MCH: 30.3 pg (ref 27.0–33.0)
MCHC: 33.5 g/dL (ref 32.0–36.0)
MCV: 90.4 fL (ref 80.0–100.0)
MPV: 10.7 fL (ref 7.5–12.5)
Monocytes Relative: 18.7 %
Neutro Abs: 2305 cells/uL (ref 1500–7800)
Neutrophils Relative %: 41.9 %
Platelets: 190 10*3/uL (ref 140–400)
RBC: 3.76 10*6/uL — ABNORMAL LOW (ref 3.80–5.10)
RDW: 13.3 % (ref 11.0–15.0)
Total Lymphocyte: 38.5 %
WBC: 5.5 10*3/uL (ref 3.8–10.8)

## 2019-07-20 LAB — COMPLETE METABOLIC PANEL WITH GFR
AG Ratio: 1.3 (calc) (ref 1.0–2.5)
ALT: 8 U/L (ref 6–29)
AST: 14 U/L (ref 10–35)
Albumin: 4.2 g/dL (ref 3.6–5.1)
Alkaline phosphatase (APISO): 45 U/L (ref 37–153)
BUN: 8 mg/dL (ref 7–25)
CO2: 23 mmol/L (ref 20–32)
Calcium: 9.4 mg/dL (ref 8.6–10.4)
Chloride: 100 mmol/L (ref 98–110)
Creat: 0.81 mg/dL (ref 0.60–0.88)
GFR, Est African American: 77 mL/min/{1.73_m2} (ref >=60)
GFR, Est Non African American: 66 mL/min/{1.73_m2} (ref >=60)
Globulin: 3.2 g/dL (calc) (ref 1.9–3.7)
Glucose, Bld: 115 mg/dL — ABNORMAL HIGH (ref 65–99)
Potassium: 4.2 mmol/L (ref 3.5–5.3)
Sodium: 135 mmol/L (ref 135–146)
Total Bilirubin: 1.2 mg/dL (ref 0.2–1.2)
Total Protein: 7.4 g/dL (ref 6.1–8.1)

## 2019-07-20 LAB — TSH: TSH: 0.55 mIU/L (ref 0.40–4.50)

## 2019-07-20 LAB — MAGNESIUM: Magnesium: 1.7 mg/dL (ref 1.5–2.5)

## 2019-07-26 NOTE — Progress Notes (Signed)
Gave patient's lab results to her son. He states that he has already been giving her a Magnesium supplement but isn't sure how many mg's it is. He is going to check and let us know. Marcelino Scot

## 2019-08-25 ENCOUNTER — Ambulatory Visit: Payer: Medicare Other | Admitting: Adult Health

## 2019-08-30 ENCOUNTER — Ambulatory Visit: Payer: Medicare Other | Admitting: Adult Health

## 2019-09-01 ENCOUNTER — Other Ambulatory Visit: Payer: Self-pay | Admitting: Adult Health

## 2019-09-01 ENCOUNTER — Other Ambulatory Visit: Payer: Self-pay | Admitting: Internal Medicine

## 2019-09-08 NOTE — Progress Notes (Deleted)
6 MONTH FOLLOW UP  Assessment:   Diagnoses and all orders for this visit:  Essential hypertension Continue medications Monitor blood pressure at home; call if consistently over 130/80 Continue DASH diet.   Reminder to go to the ER if any CP, SOB, nausea, dizziness, severe HA, changes vision/speech, left arm numbness and tingling and jaw pain.  Atherosclerosis of aorta Per CT chest 2017 Control blood pressure, cholesterol, glucose, increase exercise.   Coronary artery disease involving coronary bypass graft with other forms of angina pectoris, unspecified whether native or transplanted heart [facility]) S/p CABG x 4, no further chest pain on imdur Control blood pressure, cholesterol, glucose, increase exercise.  Followed by cardiology   Acquired hypothyroidism Currently borderline hyperthyroid off of medications with monitoring only Check TSH, refer as indicated for follow up if getting worse  Type 2 diabetes mellitus with stage 2 chronic kidney disease, without long-term current use of insulin [facility]) Education: Reviewed 'ABCs' of diabetes management (respective goals in parentheses):  A1C (<7), blood pressure (<130/80), and cholesterol (LDL <70) Eye Exam yearly (verified currently up to date) and Dental Exam every 6 months. Dietary recommendations Physical Activity recommendations  CKD stage 2 due to type 2 diabetes mellitus (HCC) Increase fluids, avoid NSAIDS, monitor sugars, will monitor CMP/GFR  Mild dementia Monitoring only; independent for most ADLs, driven by son/daughter in law with some supervision for medication management and bills Control blood pressure, cholesterol, glucose, emphasized need for increased exercise.  Followed by Dr. Delice Lesch  Vitamin D deficiency Continue supplementation  BMI 24  Long discussion about weight loss, diet, and exercise Recommended diet heavy in fruits and veggies and low in animal meats, cheeses, and dairy products, appropriate calorie  intake Discussed appropriate weight for height Follow up at next visit  Medication management CBC, CMP/GFR  Hyperlipidemia associated with T2DM Continue medications: atorvastatin  LDL goal <70 Continue low cholesterol diet and exercise.  Check lipid panel.   Over 40 minutes of exam, counseling, chart review and critical decision making was performed Future Appointments  Date Time Provider [place]  09/09/2019  2:30 PM Liane Comber, NP [facility] None  12/16/2019  2:00 PM [name], MD [facility] None  05/29/2020  3:00 PM Liane Comber, NP [facility] None       Subjective:  [name] [name] is a 86 y.o. female with hx of MI with 4V CABG in 2017 who presents for 3 month follow up on htn, hyperlipidemia, T2DM, CKD, vitamin D deficiency.   She has mild dementia previously managed by neurology Dr. Delice Lesch, now following at our office.   She follows up with son today  Walks with cane outside of home, rails in home where needed by family  She is living by herself since husband passed 10/2018, family is very close and checks daily  No longer driving, family prepares food, she can heat up in microwave No falls since backing off of BP medication, denies needs, mood fair with lexapro 5 mg and low dose remeron 15 mg in evening ***  GERD recently controlled off of medications.   BMI is There is no height or weight on file to calculate BMI., she has not been working on diet and exercise. Son has been trying to encourage her to increase fluid intake, drinking watar.  Wt Readings from Last 3 Encounters:  07/19/19 137 lb (62.1 kg)  05/25/19 134 lb (60.8 kg)  02/15/19 130 lb (59 kg)   She has aortic atherosclerosis per CT chest 2017 She has hx  of CAD with CABG x 4 in 04/2015; on imdur; follows with Dr. Marlou Porch.  She has mild aortic stenosis/ moderate aortic regurgitation with normal EF per ECHO 2020.   Her blood pressure has been controlled at home (125-140s/60-70s), today  their BP is   She does not workout. She denies chest pain, shortness of breath, dizziness.   She is on cholesterol medication (atorvastatin 80 mg daily) and denies myalgias. Her cholesterol is at goal. The cholesterol last visit was:   Lab Results  Component Value Date   CHOL 124 05/25/2019   HDL 50 05/25/2019   LDLCALC 58 05/25/2019   TRIG 81 05/25/2019   CHOLHDL 2.5 05/25/2019    She has not been working on diet and exercise for T2 diabetes controlled on metformin 500 mg daily, and denies foot ulcerations, hyperglycemia, hypoglycemia , increased appetite, nausea, polydipsia, polyuria, visual disturbances, vomiting and weight loss. Admits lost glucomerter that was just ordered last November, hasn't been checking glucose, but son plans to pick up a new one and he will keep so prevent losing. Last A1C in the office was:  Lab Results  Component Value Date   HGBA1C 6.7 (H) 05/25/2019   She has hx of thyroid disease but has been borderline hyperthyroid, not on medications, and has been monitored closely:    Lab Results  Component Value Date   TSH 0.55 07/19/2019   She has CKD II associated with T2DM, on ACEi:  Lab Results  Component Value Date   GFRNONAA 66 07/19/2019   Patient has hx of Vit D deficiency on supplement and at goal at most recent check:   Lab Results  Component Value Date   VD25OH 61 11/11/2018        Medication Review: Current Outpatient Medications on File Prior to Visit  Medication Sig Dispense Refill  . aspirin EC 81 MG tablet Take 1 tablet (81 mg total) by mouth daily. 90 tablet 3  . atorvastatin (LIPITOR) 80 MG tablet TAKE 1 TABLET DAILY FOR CHOLESTEROL 90 tablet 3  . blood glucose meter kit and supplies KIT Dispense based on patient and insurance preference. Check blood sugar 1 time daily-DX-E11.22. 1 each 0  . escitalopram (LEXAPRO) 5 MG tablet Take 1 tablet Daily for Mood 90 tablet 3  . isosorbide mononitrate (IMDUR) 30 MG 24 hr tablet Take 1 tablet Daily  for Heart 90 tablet 3  . lisinopril (ZESTRIL) 20 MG tablet Take 1 tablet Daily for BP & Diabetic Kidney Protection 90 tablet 3  . metFORMIN (GLUCOPHAGE-XR) 500 MG 24 hr tablet Take 2 tablets 2 x /day with Meals for Diabetes 360 tablet 3  . mirtazapine (REMERON) 15 MG tablet TAKE 1 TABLET ONE HOUR BEFORE SLEEP 90 tablet 1  . nitroGLYCERIN (NITROSTAT) 0.4 MG SL tablet Dissolve 1 tablet under tongue every 3 to 5 minutes if needed for ChestPain 50 tablet 12  . nystatin cream (MYCOSTATIN) Apply 1 application topically 2 (two) times daily. 30 g 1  . senna ([employer] SENNA) 8.6 MG tablet TAKE 1 TABLET AT BEDTIME FOR CONSTIPATION 90 tablet 3   No current facility-administered medications on file prior to visit.    Allergies  Allergen Reactions  . Bee Venom Anaphylaxis and Other (See Comments)    Eyes swell, cannot breathe  . Persantine [Dipyridamole] Other (See Comments)    "went crazy"  . Actos [Pioglitazone] Other (See Comments)    UNSPECIFIED REACTION     Current Problems (verified) Patient Active Problem List   Diagnosis  Date Noted  . Aortic atherosclerosis ([place]) 02/04/2019  . Anemia   . History of ischemic cardiomyopathy 06/24/2018  . Type 2 diabetes mellitus ([place]) 04/07/2017  . Mild dementia ([place]) 07/26/2016  . S/P CABG x 4   . CAD (coronary artery disease), native coronary artery 11/16/2015  . Medication management 07/27/2013  . Vitamin D deficiency 02/01/2013  . GERD (gastroesophageal reflux disease)   . Hypothyroidism   . CKD stage 2 due to type 2 diabetes mellitus ([place]) 06/04/2007  . Hyperlipidemia associated with type 2 diabetes mellitus ([place]) 06/04/2007  . BMI 24.0-24.9, adult 06/04/2007  . Essential hypertension 06/04/2007    Screening Tests Immunization History  Administered Date(s) Administered  . Influenza Inj Mdck Quad Pf 10/31/2016  . Influenza Inj Mdck Quad With Preservative 11/11/2018  . Influenza Split 10/03/2014  . Influenza,inj,quad, With Preservative  10/31/2015  . Influenza-Unspecified 11/18/2012  . Pneumococcal Conjugate-13 11/04/2013  . Pneumococcal Polysaccharide-23 07/25/2010  . Pneumococcal-Unspecified 02/01/2001  . Td 02/01/2006, 04/09/2017    Patient Care Team: [name], MD as PCP - General (Internal Medicine) Jerline Pain, MD as PCP - Cardiology (Cardiology) Warden Fillers, MD as Consulting Physician (Optometry) Stanford Breed, Denice Bors, MD as Consulting Physician (Cardiology) Sable Feil, MD as Consulting Physician (Gastroenterology)  SURGICAL HISTORY She  has a past surgical history that includes Dilation and curettage of uterus (1984); Breast biopsy (Left, 1999); Colonoscopy (05/2012); Cardiac catheterization (N/A, 11/15/2015); TEE without cardioversion (N/A, 11/16/2015); and Coronary artery bypass graft (11/16/2015). FAMILY HISTORY Her family history includes Breast cancer in her maternal aunt and maternal grandmother; Diabetes in her father; Lung cancer in her brother; Prostate cancer in her brother; Stroke in her mother; Throat cancer in her maternal uncle. SOCIAL HISTORY She  reports that she has never smoked. Her smokeless tobacco use includes snuff. She reports that she does not drink alcohol and does not use drugs.   Review of Systems  Constitutional: Negative for malaise/fatigue and weight loss.  HENT: Negative for hearing loss and tinnitus.   Eyes: Negative for blurred vision and double vision.  Respiratory: Negative for cough, sputum production, shortness of breath and wheezing.   Cardiovascular: Negative for chest pain, palpitations, orthopnea, claudication, leg swelling and PND.  Gastrointestinal: Negative for abdominal pain, blood in stool, constipation, diarrhea, heartburn, melena, nausea and vomiting.  Genitourinary: Negative.   Musculoskeletal: Negative for falls, joint pain and myalgias.  Skin: Negative for rash.  Neurological: Negative for dizziness, tingling, sensory change, weakness and  headaches.  Endo/Heme/Allergies: Negative for polydipsia.  Psychiatric/Behavioral: Negative.  Negative for depression, memory loss, substance abuse and suicidal ideas. The patient is not nervous/anxious and does not have insomnia.   All other systems reviewed and are negative.    Objective:     There were no vitals filed for this visit. There is no height or weight on file to calculate BMI.  General appearance: alert, no distress, WD/WN, female HEENT: normocephalic, sclerae anicteric, TMs pearly, nares patent, no discharge or erythema, pharynx normal Oral cavity: MMM, no lesions Neck: supple, no lymphadenopathy, no thyromegaly, no masses Heart: RRR, normal S1, S2, 3/6 systolic blowing murmur (has discussed with cardiology) *** Lungs: CTA bilaterally, no wheezes, rhonchi, or rales Abdomen: +bs, soft, non tender, non distended, no masses, no hepatomegaly, no splenomegaly Musculoskeletal: nontender, no swelling, no obvious deformity Extremities: no edema, no cyanosis, no clubbing Pulses: 2+ symmetric, upper and lower extremities, normal cap refill Neurological: alert, oriented x 3, CN2-12 intact, strength normal upper extremities and lower extremities,  sensation normal throughout, DTRs 2+ throughout, no cerebellar signs, gait slow but normal *** Psychiatric: normal affect, behavior normal, pleasant   Medicare Attestation I have personally reviewed: The patient's medical and social history Their use of alcohol, tobacco or illicit drugs Their current medications and supplements The patient's functional ability including ADLs,fall risks, home safety risks, cognitive, and hearing and visual impairment Diet and physical activities Evidence for depression or mood disorders  The patient's weight, height, BMI, and visual acuity have been recorded in the chart.  I have made referrals, counseling, and provided education to the patient based on review of the above and I have provided the patient  with a written personalized care plan for preventive services.     Izora Ribas, NP   09/08/2019

## 2019-09-09 ENCOUNTER — Ambulatory Visit: Payer: Medicare Other | Admitting: Adult Health

## 2019-09-30 ENCOUNTER — Other Ambulatory Visit: Payer: Self-pay | Admitting: Adult Health

## 2019-10-05 ENCOUNTER — Other Ambulatory Visit: Payer: Self-pay | Admitting: Internal Medicine

## 2019-11-30 ENCOUNTER — Other Ambulatory Visit: Payer: Self-pay

## 2019-11-30 DIAGNOSIS — N182 Chronic kidney disease, stage 2 (mild): Secondary | ICD-10-CM

## 2019-11-30 DIAGNOSIS — E1122 Type 2 diabetes mellitus with diabetic chronic kidney disease: Secondary | ICD-10-CM

## 2019-11-30 MED ORDER — ATORVASTATIN CALCIUM 80 MG PO TABS
80.0000 mg | ORAL_TABLET | Freq: Every day | ORAL | 3 refills | Status: DC
Start: 2019-11-30 — End: 2021-01-10

## 2019-11-30 MED ORDER — METFORMIN HCL ER 500 MG PO TB24: ORAL_TABLET | ORAL | 3 refills | Status: AC

## 2019-12-15 ENCOUNTER — Ambulatory Visit: Payer: Medicare Other | Admitting: Cardiology

## 2019-12-16 ENCOUNTER — Encounter: Payer: Medicare Other | Admitting: Internal Medicine

## 2020-05-26 NOTE — Progress Notes (Signed)
MEDICARE ANNUAL WELLNESS VISIT AND FOLLOW UP  Assessment:   Diagnoses and all orders for this visit:  Encounter for Medicare annual wellness exam Due annually   Essential hypertension Continue medications Monitor blood pressure at home; call if consistently over 130/80 Continue DASH diet.   Reminder to go to the ER if any CP, SOB, nausea, dizziness, severe HA, changes vision/speech, left arm numbness and tingling and jaw pain.  Atherosclerosis of aorta ([place]) Per CT chest 2017 Control blood pressure, cholesterol, glucose, increase exercise.   Coronary artery disease involving coronary bypass graft with other forms of angina pectoris, unspecified whether native or transplanted heart [facility]) S/p CABG x 4, no further chest pain on imdur Control blood pressure, cholesterol, glucose, increase exercise.  Followed by cardiology  S/P CABG x 4 Control blood pressure, cholesterol, glucose, increase exercise. Follows with cardiology    Type 2 diabetes mellitus with stage 2 chronic kidney disease, without long-term current use of insulin [facility]) Education: Reviewed 'ABCs' of diabetes management (respective goals in parentheses):  A1C (<7), blood pressure (<130/80), and cholesterol (LDL <70) Eye Exam yearly (report requested) and Dental Exam every 6 months. Normal foot exam Dietary recommendations Physical Activity recommendations  Hyperlipidemia associated with T2DM (HCC) Continue medications: atorvastatin  LDL goal <70 Continue low cholesterol diet and exercise.  Check lipid panel.   CKD stage 2 due to type 2 diabetes mellitus (HCC) Increase fluids, avoid NSAIDS, monitor sugars, will monitor CMP/GFR  Gastroesophageal reflux disease, esophagitis presence not specified Well managed off of medications at this time - no problems in over a year Discussed diet, avoiding triggers and other lifestyle changes  Acquired hypothyroidism Currently borderline hyperthyroid off of medications  with monitoring only Check TSH, refer as indicated for follow up if getting worse  Mild dementia ([place]) Monitoring only; independent for most ADLs, driven by son/daughter in law with some supervision for medication management and bills Control blood pressure, cholesterol, glucose, emphasized need for increased exercise.  Formerly followed by Dr. Delice Lesch  Vitamin D deficiency Continue supplementation as tolerated for goal 60-100 Check vitamin D level   BMI 21 Long discussion about weight loss, diet, and exercise Recommended diet heavy in fruits and veggies and low in animal meats, cheeses, and dairy products, appropriate calorie intake Discussed appropriate weight for height Follow up at next visit  Medication management CBC, CMP/GFR  Anemia, chronic Monitor CBC, has been stable Ger on B12 supplement - last check was def  Hard of hearing Free hearing test information given   Over 40 minutes of exam, counseling, chart review and critical decision making was performed Future Appointments  Date Time Provider [place]  05/29/2021  3:00 PM Liane Comber, NP [facility] None     Plan:   During the course of the visit the patient was educated and counseled about appropriate screening and preventive services including:    Pneumococcal vaccine   Prevnar 13  Influenza vaccine  Td vaccine  Screening electrocardiogram  Bone densitometry screening  Colorectal cancer screening  Diabetes screening  Glaucoma screening  Nutrition counseling   Advanced directives: requested   Subjective:  [name] [name] is a 87 y.o. female with hx of MI with 4V CABG in 2017 who presents for Medicare Annual Wellness Visit and 3 month follow up on htn, hyperlipidemia, T2DM, CKD, vitamin D deficiency.   She has mild dementia previously managed by neurology Dr. Delice Lesch, no meds recommended, now following at our office. Alternates prevagen and neuriva. She follows up with son  today.  Walks with cane outside of home, rails in home where needed by family but hasn't needed recently.   She is living by herself since husband passed 10/2018, family is very close and checks daily. No longer driving, family prepares food, she can heat up in microwave No falls, denies needs. Has been off of lexapro that was started after husband passed, mood remains good.   BMI is Body mass index is 21.56 kg/m., she has not been working on diet and exercise, but generally active around the home, has been Spring cleaning. Weight is down since family member stoped bringing junk food, eating more regular food with good appetite -  Wt Readings from Last 3 Encounters:  05/29/20 116 lb (52.6 kg)  07/19/19 137 lb (62.1 kg)  05/25/19 134 lb (60.8 kg)   She has aortic atherosclerosis per CT chest 2017 She has hx of CAD with CABG x 4 in 04/2015; on imdur; follows with Dr. Marlou Porch.  She has mild aortic stenosis/ moderate aortic regurgitation with normal EF per ECHO 2020.    Her blood pressure has been controlled at home, today their BP is BP: (!) 100/56 She does not workout. She denies chest pain, shortness of breath, dizziness.   She is on cholesterol medication (atorvastatin 80 mg daily) and denies myalgias. Her cholesterol is at goal. The cholesterol last visit was:   Lab Results  Component Value Date   CHOL 124 05/25/2019   HDL 50 05/25/2019   LDLCALC 58 05/25/2019   TRIG 81 05/25/2019   CHOLHDL 2.5 05/25/2019    She has not been working on diet and exercise for T2 diabetes controlled on metformin 500 mg BID, and denies foot ulcerations, hyperglycemia, hypoglycemia , increased appetite, nausea, polydipsia, polyuria, visual disturbances, vomiting and weight loss. Hasn't checked glucose recently, machine keeps going missing per son. Last A1C in the office was:  Lab Results  Component Value Date   HGBA1C 6.7 (H) 05/25/2019   She has hx of thyroid disease but has been borderline  hyperthyroid, not on medications, and has been monitored closely:    Lab Results  Component Value Date   TSH 0.55 07/19/2019   She has CKD II associated with T2DM:  Lab Results  Component Value Date   GFRNONAA 66 07/19/2019   Patient has hx of Vit D deficiency on supplement and at goal at most recent check:   Lab Results  Component Value Date   VD25OH 78 11/11/2018     Son isn't sure she has been on B12 supplement; will start Lab Results  Component Value Date   VITAMINB12 300 06/24/2018     Medication Review: Current Outpatient Medications on File Prior to Visit  Medication Sig Dispense Refill  . Apoaequorin (PREVAGEN PO) Take by mouth.    Marland Kitchen aspirin EC 81 MG tablet Take 1 tablet (81 mg total) by mouth daily. 90 tablet 3  . atorvastatin (LIPITOR) 80 MG tablet Take 1 tablet (80 mg total) by mouth daily. for cholesterol 90 tablet 3  . blood glucose meter kit and supplies KIT Dispense based on patient and insurance preference. Check blood sugar 1 time daily-[id number]. 1 each 0  . isosorbide mononitrate (IMDUR) 30 MG 24 hr tablet Take 1 tablet Daily for Heart 90 tablet 3  . lisinopril (ZESTRIL) 20 MG tablet Take 1 tablet Daily for BP & Diabetic Kidney Protection 90 tablet 3  . metFORMIN (GLUCOPHAGE-XR) 500 MG 24 hr tablet Take 2 tablets 2 x /day with Meals  for Diabetes 360 tablet 3  . Misc Natural Products (NEURIVA PO) Take by mouth daily.    . nitroGLYCERIN (NITROSTAT) 0.4 MG SL tablet Dissolve 1 tablet under tongue every 3 to 5 minutes if needed for ChestPain 50 tablet 12  . senna ([employer] SENNA) 8.6 MG tablet TAKE 1 TABLET AT BEDTIME FOR CONSTIPATION 90 tablet 3   No current facility-administered medications on file prior to visit.    Allergies  Allergen Reactions  . Bee Venom Anaphylaxis and Other (See Comments)    Eyes swell, cannot breathe  . Persantine [Dipyridamole] Other (See Comments)    "went crazy"  . Actos [Pioglitazone] Other (See Comments)    UNSPECIFIED REACTION      Current Problems (verified) Patient Active Problem List   Diagnosis Date Noted  . Aortic atherosclerosis ([place]) 02/04/2019  . Chronic anemia   . History of ischemic cardiomyopathy 06/24/2018  . Type 2 diabetes mellitus ([place]) 04/07/2017  . Mild dementia ([place]) 07/26/2016  . S/P CABG x 4   . CAD (coronary artery disease), native coronary artery 11/16/2015  . Medication management 07/27/2013  . Vitamin D deficiency 02/01/2013  . GERD (gastroesophageal reflux disease)   . Hypothyroidism   . CKD stage 2 due to type 2 diabetes mellitus ([place]) 06/04/2007  . Hyperlipidemia associated with type 2 diabetes mellitus ([place]) 06/04/2007  . BMI 24.0-24.9, adult 06/04/2007  . Essential hypertension 06/04/2007    Immunization History  Administered Date(s) Administered  . Influenza Inj Mdck Quad Pf 10/31/2016  . Influenza Inj Mdck Quad With Preservative 11/11/2018  . Influenza Split 10/03/2014  . Influenza,inj,quad, With Preservative 10/31/2015  . Influenza-Unspecified 11/18/2012  . Pneumococcal Conjugate-13 11/04/2013  . Pneumococcal Polysaccharide-23 07/25/2010  . Pneumococcal-Unspecified 02/01/2001  . Td 02/01/2006, 04/09/2017    Preventative care: Last colonoscopy: 2014 Last mammogram: 2007 - declines Last pap smear/pelvic exam: 2012, DONE DEXA: 2015 late osteopenia - declines  Prior vaccinations: TD or Tdap: 2019  Influenza: 11/2018 - declines further, got very ill last time Pneumococcal: 2003, 2012 Prevnar13: 2015 Shingles/Zostavax: Declined due to cost Covid 19: patient declined   Names of Other Physician/Practitioners you currently use: 1. [facility]- here for primary care 2. Dr. Katy Fitch, eye doctor, last visit 2021 reported by son - report requested 3. Does not see, dentist, last visit unknown - full dentures  Patient Care Team: [name], MD as PCP - General (Internal Medicine) Jerline Pain, MD as PCP - Cardiology  (Cardiology) Warden Fillers, MD as Consulting Physician (Optometry) Stanford Breed, Denice Bors, MD as Consulting Physician (Cardiology) Sable Feil, MD as Consulting Physician (Gastroenterology)  SURGICAL HISTORY She  has a past surgical history that includes Dilation and curettage of uterus (1984); Breast biopsy (Left, 1999); Colonoscopy (05/2012); Cardiac catheterization (N/A, 11/15/2015); TEE without cardioversion (N/A, 11/16/2015); and Coronary artery bypass graft (11/16/2015). FAMILY HISTORY Her family history includes Breast cancer in her maternal aunt and maternal grandmother; Diabetes in her father; Lung cancer in her brother; Prostate cancer in her brother; Stroke in her mother; Throat cancer in her maternal uncle. SOCIAL HISTORY She  reports that she has never smoked. Her smokeless tobacco use includes snuff. She reports that she does not drink alcohol and does not use drugs.   MEDICARE WELLNESS OBJECTIVES: Physical activity: Current Exercise Habits: The patient does not participate in regular exercise at present, Exercise limited by: None identified Cardiac risk factors: Cardiac Risk Factors include: advanced age (>11mn, >>62women);dyslipidemia;hypertension;smoking/ tobacco exposure;sedentary lifestyle Depression/mood screen:  Depression screen [facility] 2/9 05/29/2020  Decreased Interest 0  Down, Depressed, Hopeless 0  PHQ - 2 Score 0  Altered sleeping -  Tired, decreased energy -  Change in appetite -  Feeling bad or failure about yourself  -  Trouble concentrating -  Moving slowly or fidgety/restless -  Suicidal thoughts -  PHQ-9 Score -  Difficult doing work/chores -    ADLs:  In your present state of health, do you have any difficulty performing the following activities: 05/29/2020  Hearing? Y  Comment declined in the last month  Vision? N  Difficulty concentrating or making decisions? Y  Comment stable, family assists  Walking or climbing stairs? N  Comment uses  handrails  Dressing or bathing? N  Doing errands, shopping? Y  Comment family assists  Preparing Food and eating ? N  Using the Toilet? N  In the past six months, have you accidently leaked urine? N  Do you have problems with loss of bowel control? N  Managing your Medications? Y  Comment family assists  Managing your Finances? Y  Housekeeping or managing your Housekeeping? Y  Some recent data might be hidden     Cognitive Testing  Alert? Yes  Normal Appearance?Yes  Oriented to person? Yes  Place? Yes   Time? Yes  Recall of three objects?  No - 1/3   Can perform simple calculations? Yes  Displays appropriate judgment?Yes  Can read the correct time from a watch face?Yes  EOL planning: Does Patient Have a Medical Advance Directive?: No Would patient like information on creating a medical advance directive?: Yes (MAU/Ambulatory/Procedural Areas - Information given)  Review of Systems  Constitutional: Negative for malaise/fatigue and weight loss.  HENT: Positive for hearing loss (deline in last 2 months ). Negative for tinnitus.   Eyes: Negative for blurred vision and double vision.  Respiratory: Negative for cough, sputum production, shortness of breath and wheezing.   Cardiovascular: Negative for chest pain, palpitations, orthopnea, claudication, leg swelling and PND.  Gastrointestinal: Positive for constipation (intermittent, takes stool softener PRN). Negative for abdominal pain, blood in stool, diarrhea, heartburn, melena, nausea and vomiting.  Genitourinary: Negative.   Musculoskeletal: Negative for falls (1 fall, no injury), joint pain and myalgias.  Skin: Negative for rash.  Neurological: Negative for dizziness, tingling, sensory change, weakness and headaches.  Endo/Heme/Allergies: Negative for polydipsia.  Psychiatric/Behavioral: Positive for memory loss (stable/unchanged). Negative for depression, substance abuse and suicidal ideas. The patient is not nervous/anxious and  does not have insomnia.   All other systems reviewed and are negative.    Objective:     Today's Vitals   05/29/20 1504  BP: (!) 100/56  Pulse: 84  Temp: (!) 96.6 F (35.9 C)  SpO2: 97%  Weight: 116 lb (52.6 kg)  Height: 5' 1.5" (1.562 m)   Body mass index is 21.56 kg/m.  General appearance: alert, no distress, WD/WN, female HEENT: normocephalic, sclerae anicteric, TMs pearly, nares patent, no discharge or erythema, pharynx normal Oral cavity: MMM, no lesions Neck: supple, no lymphadenopathy, no thyromegaly, no masses Heart: RRR, normal S1, S2, no murmur today  Lungs: CTA bilaterally, no wheezes, rhonchi, or rales Abdomen: +bs, soft, non tender, non distended, no masses, no hepatomegaly, no splenomegaly Musculoskeletal: nontender, no swelling, no obvious deformity Extremities: no edema, no cyanosis, no clubbing Pulses: 2+ symmetric, upper and lower extremities, normal cap refill Neurological: alert, oriented x 3, CN2-12 intact, strength normal upper extremities and lower extremities, sensation normal throughout, DTRs 2+ throughout,  no cerebellar signs, gait slow but normal Psychiatric: normal affect, behavior normal, pleasant   Medicare Attestation I have personally reviewed: The patient's medical and social history Their use of alcohol, tobacco or illicit drugs Their current medications and supplements The patient's functional ability including ADLs,fall risks, home safety risks, cognitive, and hearing and visual impairment Diet and physical activities Evidence for depression or mood disorders  The patient's weight, height, BMI, and visual acuity have been recorded in the chart.  I have made referrals, counseling, and provided education to the patient based on review of the above and I have provided the patient with a written personalized care plan for preventive services.     [name] Ribas, NP   05/29/2020

## 2020-05-29 ENCOUNTER — Other Ambulatory Visit: Payer: Self-pay

## 2020-05-29 ENCOUNTER — Encounter: Payer: Self-pay | Admitting: Adult Health

## 2020-05-29 ENCOUNTER — Ambulatory Visit (INDEPENDENT_AMBULATORY_CARE_PROVIDER_SITE_OTHER): Payer: Medicare Other | Admitting: Adult Health

## 2020-05-29 VITALS — BP 100/56 | HR 84 | Temp 96.6°F | Ht 61.5 in | Wt 116.0 lb

## 2020-05-29 DIAGNOSIS — I10 Essential (primary) hypertension: Secondary | ICD-10-CM

## 2020-05-29 DIAGNOSIS — R6889 Other general symptoms and signs: Secondary | ICD-10-CM

## 2020-05-29 DIAGNOSIS — E559 Vitamin D deficiency, unspecified: Secondary | ICD-10-CM

## 2020-05-29 DIAGNOSIS — Z6824 Body mass index (BMI) 24.0-24.9, adult: Secondary | ICD-10-CM

## 2020-05-29 DIAGNOSIS — E538 Deficiency of other specified B group vitamins: Secondary | ICD-10-CM

## 2020-05-29 DIAGNOSIS — E1122 Type 2 diabetes mellitus with diabetic chronic kidney disease: Secondary | ICD-10-CM

## 2020-05-29 DIAGNOSIS — F0390 Unspecified dementia without behavioral disturbance: Secondary | ICD-10-CM

## 2020-05-29 DIAGNOSIS — F03A0 Unspecified dementia, mild, without behavioral disturbance, psychotic disturbance, mood disturbance, and anxiety: Secondary | ICD-10-CM

## 2020-05-29 DIAGNOSIS — Z951 Presence of aortocoronary bypass graft: Secondary | ICD-10-CM

## 2020-05-29 DIAGNOSIS — H9190 Unspecified hearing loss, unspecified ear: Secondary | ICD-10-CM

## 2020-05-29 DIAGNOSIS — K219 Gastro-esophageal reflux disease without esophagitis: Secondary | ICD-10-CM

## 2020-05-29 DIAGNOSIS — Z0001 Encounter for general adult medical examination with abnormal findings: Secondary | ICD-10-CM

## 2020-05-29 DIAGNOSIS — Z0000 Encounter for general adult medical examination without abnormal findings: Secondary | ICD-10-CM

## 2020-05-29 DIAGNOSIS — I25118 Atherosclerotic heart disease of native coronary artery with other forms of angina pectoris: Secondary | ICD-10-CM | POA: Diagnosis not present

## 2020-05-29 DIAGNOSIS — I700 Atherosclerosis of aorta: Secondary | ICD-10-CM | POA: Diagnosis not present

## 2020-05-29 DIAGNOSIS — E039 Hypothyroidism, unspecified: Secondary | ICD-10-CM

## 2020-05-29 DIAGNOSIS — E1169 Type 2 diabetes mellitus with other specified complication: Secondary | ICD-10-CM

## 2020-05-29 DIAGNOSIS — Z79899 Other long term (current) drug therapy: Secondary | ICD-10-CM

## 2020-05-29 DIAGNOSIS — Z8679 Personal history of other diseases of the circulatory system: Secondary | ICD-10-CM

## 2020-05-29 DIAGNOSIS — D649 Anemia, unspecified: Secondary | ICD-10-CM

## 2020-05-29 NOTE — Patient Instructions (Addendum)
    Get on B12 supplement -   Review meds  Try senna/senokot for stools instead of softener  Check blood pressures at home - if running low let me know, will reduce lisinopril    [employer] Test with no obligation # [phone] Do not have to be a member Tues-Sat 10-6  [employer]- free test with no obligation # [phone] MUST BE A MEMBER Call for store hours  Have had patient's get good cheaper hearing aids from [employer] The air version has good reviews.

## 2020-05-30 LAB — COMPLETE METABOLIC PANEL WITH GFR
AG Ratio: 1.4 (calc) (ref 1.0–2.5)
ALT: 6 U/L (ref 6–29)
AST: 11 U/L (ref 10–35)
Albumin: 4.3 g/dL (ref 3.6–5.1)
Alkaline phosphatase (APISO): 46 U/L (ref 37–153)
BUN/Creatinine Ratio: 12 (calc) (ref 6–22)
BUN: 11 mg/dL (ref 7–25)
CO2: 29 mmol/L (ref 20–32)
Calcium: 9.5 mg/dL (ref 8.6–10.4)
Chloride: 100 mmol/L (ref 98–110)
Creat: 0.89 mg/dL — ABNORMAL HIGH (ref 0.60–0.88)
GFR, Est African American: 68 mL/min/{1.73_m2} (ref >=60)
GFR, Est Non African American: 59 mL/min/{1.73_m2} — ABNORMAL LOW (ref >=60)
Globulin: 3.1 g/dL (calc) (ref 1.9–3.7)
Glucose, Bld: 150 mg/dL — ABNORMAL HIGH (ref 65–99)
Potassium: 4.2 mmol/L (ref 3.5–5.3)
Sodium: 137 mmol/L (ref 135–146)
Total Bilirubin: 0.9 mg/dL (ref 0.2–1.2)
Total Protein: 7.4 g/dL (ref 6.1–8.1)

## 2020-05-30 LAB — HEMOGLOBIN A1C
Hgb A1c MFr Bld: 7 % of total Hgb — ABNORMAL HIGH (ref <5.7)
Mean Plasma Glucose: 154 mg/dL
eAG (mmol/L): 8.5 mmol/L

## 2020-05-30 LAB — CBC WITH DIFFERENTIAL/PLATELET
Absolute Monocytes: 1009 cells/uL — ABNORMAL HIGH (ref 200–950)
Basophils Absolute: 17 cells/uL (ref 0–200)
Basophils Relative: 0.3 %
Eosinophils Absolute: 41 cells/uL (ref 15–500)
Eosinophils Relative: 0.7 %
HCT: 35.7 % (ref 35.0–45.0)
Hemoglobin: 11.9 g/dL (ref 11.7–15.5)
Lymphs Abs: 2239 cells/uL (ref 850–3900)
MCH: 29.5 pg (ref 27.0–33.0)
MCHC: 33.3 g/dL (ref 32.0–36.0)
MCV: 88.4 fL (ref 80.0–100.0)
MPV: 11 fL (ref 7.5–12.5)
Monocytes Relative: 17.4 %
Neutro Abs: 2494 cells/uL (ref 1500–7800)
Neutrophils Relative %: 43 %
Platelets: 163 10*3/uL (ref 140–400)
RBC: 4.04 10*6/uL (ref 3.80–5.10)
RDW: 12.8 % (ref 11.0–15.0)
Total Lymphocyte: 38.6 %
WBC: 5.8 10*3/uL (ref 3.8–10.8)

## 2020-05-30 LAB — LIPID PANEL
Cholesterol: 103 mg/dL (ref <200)
HDL: 41 mg/dL — ABNORMAL LOW (ref >=50)
LDL Cholesterol (Calc): 46 mg/dL (calc)
Non-HDL Cholesterol (Calc): 62 mg/dL (calc) (ref <130)
Total CHOL/HDL Ratio: 2.5 (calc) (ref <5.0)
Triglycerides: 78 mg/dL (ref <150)

## 2020-05-30 LAB — TSH: TSH: 0.4 mIU/L (ref 0.40–4.50)

## 2020-05-30 LAB — MAGNESIUM: Magnesium: 1.6 mg/dL (ref 1.5–2.5)

## 2020-05-30 LAB — VITAMIN D 25 HYDROXY (VIT D DEFICIENCY, FRACTURES): Vit D, 25-Hydroxy: 82 ng/mL (ref 30–100)

## 2020-09-21 IMAGING — CT CT MAXILLOFACIAL WITH CONTRAST
3 series · 14 of 47 positions shown, 16 images · IV contrast (omnipaque)
Comparison: None.

CLINICAL DATA: Dental pain. Evaluation for abscess.

EXAM:
CT MAXILLOFACIAL WITH CONTRAST
TECHNIQUE: Multidetector CT imaging of the maxillofacial structures was
performed with intravenous contrast. Multiplanar CT image
reconstructions were also generated.
CONTRAST:  75mL OMNIPAQUE IOHEXOL 300 MG/ML  SOLN

[Series 3: max soft · axial · 0.31mm/px · z∈[+1556,+1696]mm · 8 of 82 slices shown, 10 images]
[im 6/82  brain]
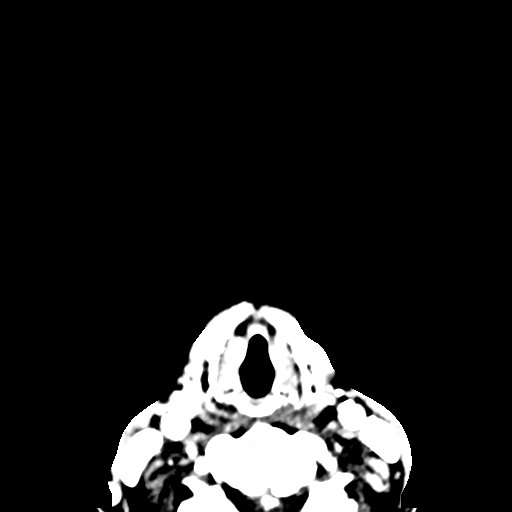
[im 6/82  bone]
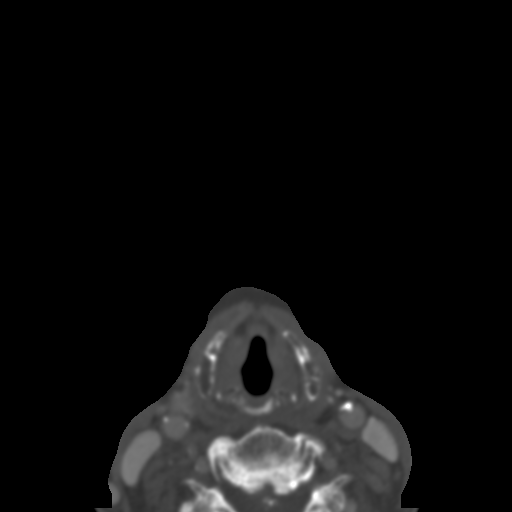
[im 17/82  bone]
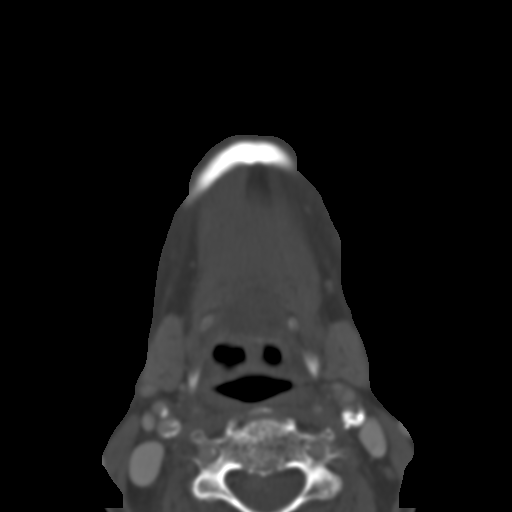
[im 26/82  bone]
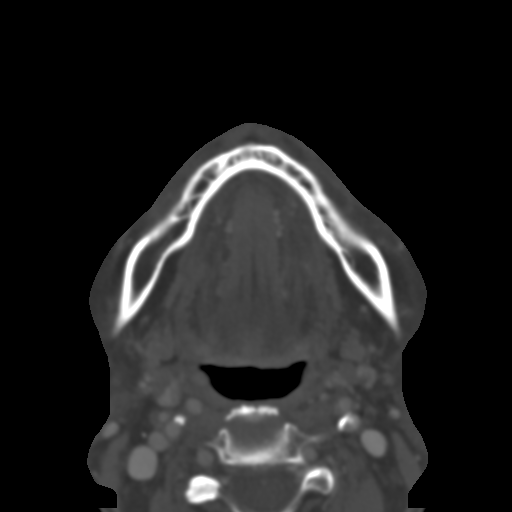
[im 37/82  bone]
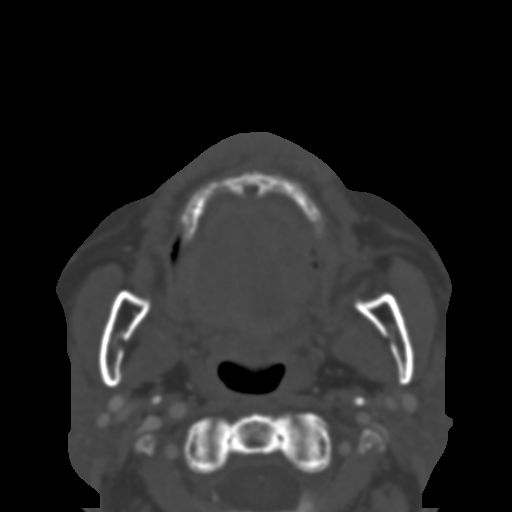
[im 45/82  brain]
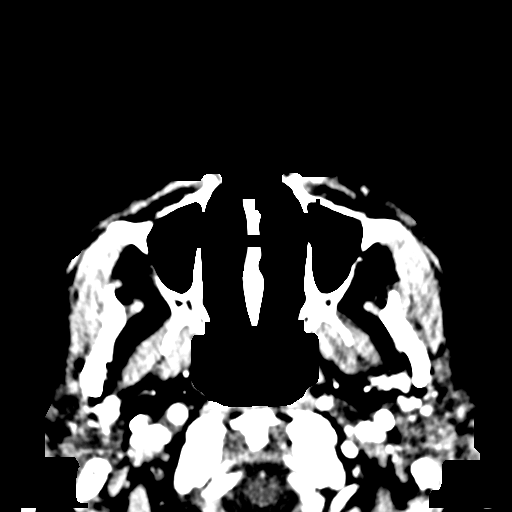
[im 45/82  bone]
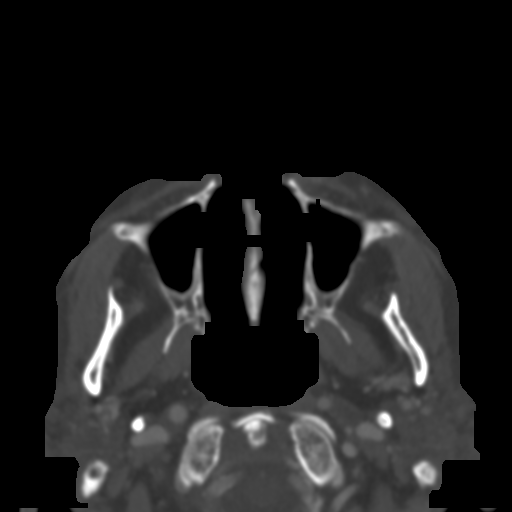
[im 56/82  bone]
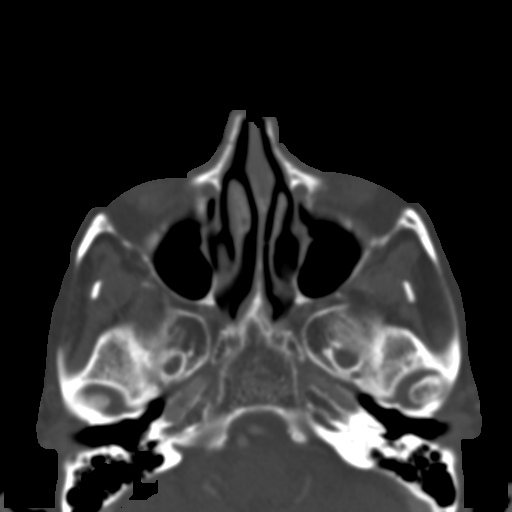
[im 65/82  bone]
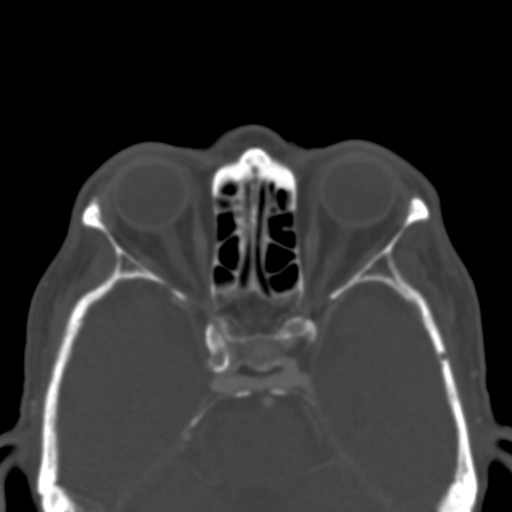
[im 76/82  bone]
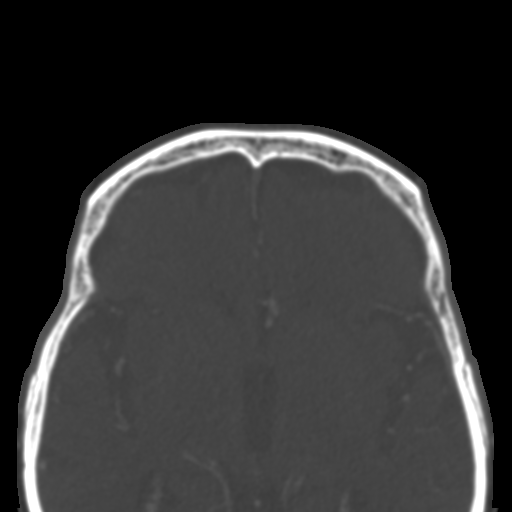

[Series 5: coronal soft · coronal · 0.29mm/px · 3 of 66 slices shown]
[im 22/66  bone]
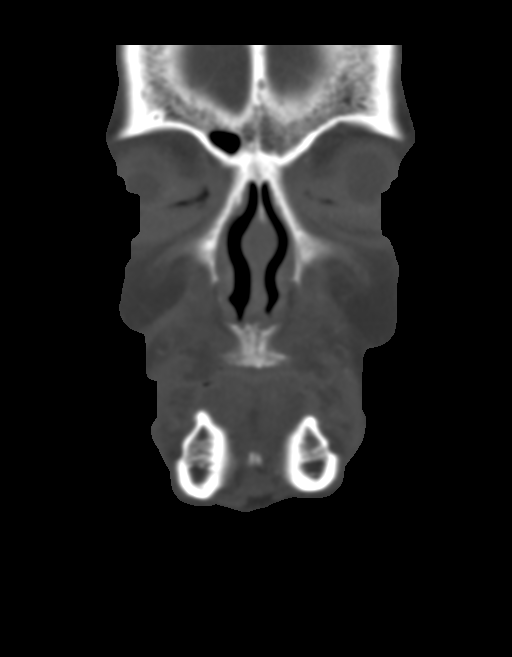
[im 29/66  bone]
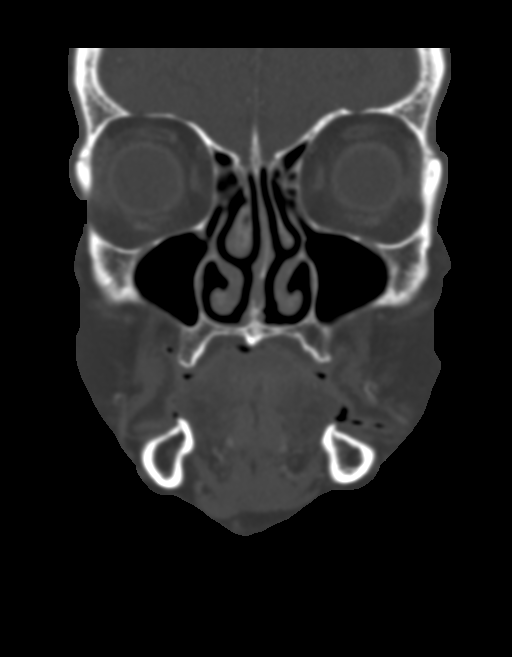
[im 37/66  bone]
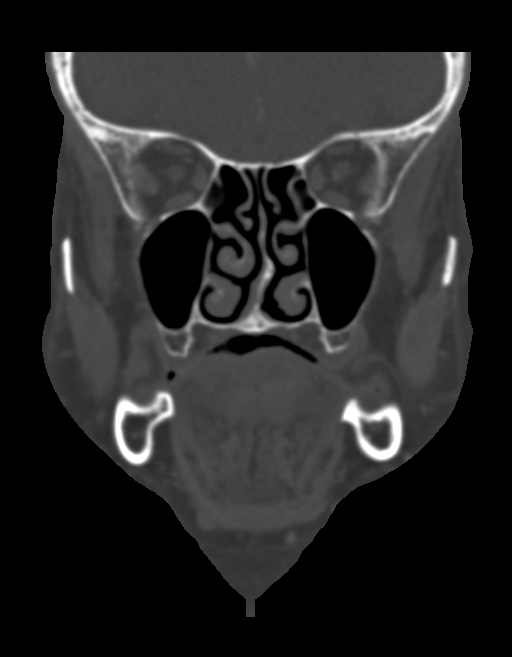

[Series 6: sagittal soft · sagittal · 0.26mm/px · 3 of 75 slices shown]
[im 25/75  bone]
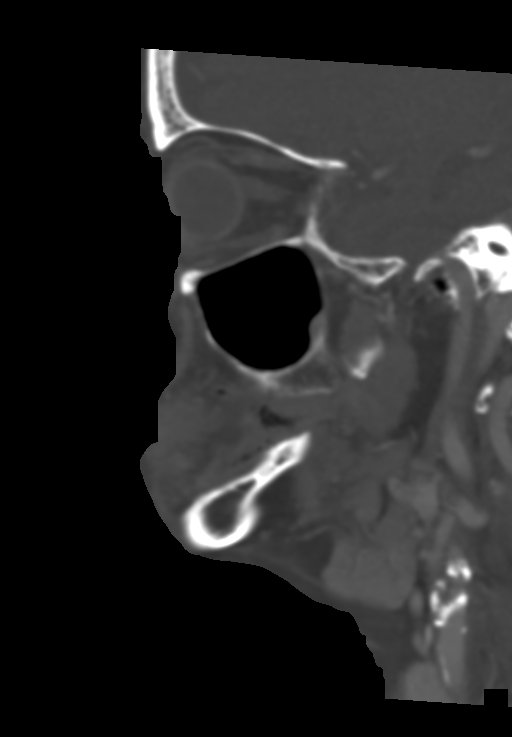
[im 38/75  bone]
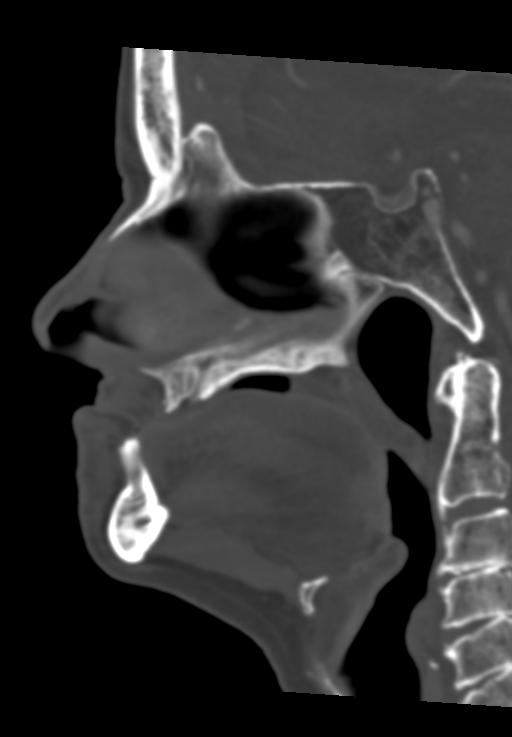
[im 50/75  bone]
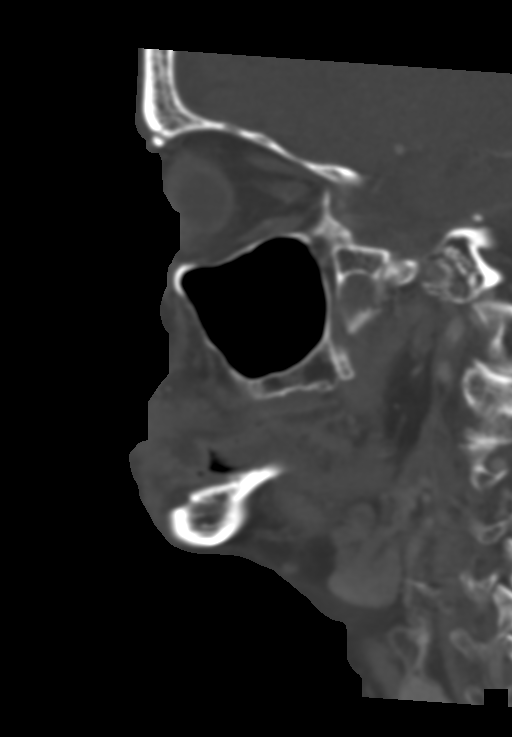

[14 of 47 positions shown; findings below may reference images not displayed]

FINDINGS: Osseous: Absent dentition aside from residual mandibular incisor
tooth fragments with periapical lucency associated with the right
central incisor. No evidence of associated subperiosteal abscess or
significant overlying soft tissue swelling. No fracture.

Orbits: Bilateral cataract extraction.

Sinuses: Small right maxillary sinus mucous retention cyst. Clear
mastoid air cells.

Soft tissues: Extensive bilateral carotid artery calcific
atherosclerosis with potentially severe proximal ICA stenoses
bilaterally. Assessment is limited by non angiographic technique and
the dense calcification limiting visualization of the residual
patent lumen.

Limited intracranial: Carotid siphon and vertebral artery
atherosclerosis.
IMPRESSION: 1. Periapical lucency associated with the right central incisor
tooth fragment. No evidence of subperiosteal abscess.
2. Extensive atherosclerosis with potentially severe bilateral ICA
stenoses.

## 2020-10-03 NOTE — Progress Notes (Signed)
CPE AND FOLLOW UP  Assessment:   Diagnoses and all orders for this visit:  Encounter for Annual Physical Exam with abnormal findings Due annually  Health Maintenance reviewed Healthy lifestyle reviewed and goals set Declines mammogram, flu vaccine Covid 19 discussed, will consider, get at pharmacy  Check with insurance about shingrix  Essential hypertension Restart lisinopril low dose, 2.5 mg Monitor blood pressure at home; call if consistently over 130/80 Continue DASH diet.   Reminder to go to the ER if any CP, SOB, nausea, dizziness, severe HA, changes vision/speech, left arm numbness and tingling and jaw pain.  Atherosclerosis of aorta ([place]) Per CT chest 2017 Control blood pressure, cholesterol, glucose, increase exercise.   Coronary artery disease involving coronary bypass graft without angina S/p CABG x 4, no further chest pain despite off of indur Control blood pressure, cholesterol, glucose, increase exercise.  Followed by cardiology - overdue, referral placed back - EKG today   S/P CABG x 4 Control blood pressure, cholesterol, glucose, increase exercise. Follows with cardiology  -  Type 2 diabetes mellitus with stage 2 chronic kidney disease, without long-term current use of insulin [facility]) Education: Reviewed 'ABCs' of diabetes management (respective goals in parentheses):  A1C (<7), blood pressure (<130/80), and cholesterol (LDL <70) Eye Exam yearly (report requested) and Dental Exam every 6 months. Normal foot exam Dietary recommendations Physical Activity recommendations  Hyperlipidemia associated with T2DM (HCC) Continue medications: atorvastatin  LDL goal <70 Continue low cholesterol diet and exercise.  Check lipid panel.   CKD stage 2 due to type 2 diabetes mellitus (HCC) Increase fluids, avoid NSAIDS, monitor sugars, will monitor  Gastroesophageal reflux disease, esophagitis presence not specified Well managed off of medications at this time - no  problems in over a year Discussed diet, avoiding triggers and other lifestyle changes  Acquired hypothyroidism Currently borderline hyperthyroid off of medications with monitoring only Check TSH, refer as indicated for follow up if getting worse  Mild dementia ([place]) Monitoring only; independent for most ADLs, driven by son/daughter in law with some supervision for medication management and bills Control blood pressure, cholesterol, glucose, emphasized need for increased exercise.  Formerly followed by Dr. Delice Lesch   Vitamin D deficiency Continue supplementation as tolerated for goal 60-100 Check vitamin D level   BMI 23 Continue to recommend diet heavy in fruits and veggies and low in animal meats, cheeses, and dairy products, appropriate calorie intake Discuss exercise recommendations routinely Continue to monitor weight at each visit  Medication management CBC, CMP/GFR  Anemia, chronic Monitor CBC, has been stable - last check was def, insurance won't cover screening lab, cancelled today Ger on B12 supplement - 250-500 mcg daily   Hard of hearing Referral for audiology placed  Orders Placed This Encounter  Procedures   CBC with Differential/Platelet   COMPLETE METABOLIC PANEL WITH GFR   Magnesium   Lipid panel   TSH   Hemoglobin A1c   Vitamin B12   Microalbumin / creatinine urine ratio   Urinalysis, Routine w reflex microscopic   Folate RBC   Ambulatory referral to Audiology   Ambulatory referral to Cardiology   EKG 12-Lead   HM DIABETES FOOT EXAM     Over 40 minutes of exam, counseling, chart review and critical decision making was performed Future Appointments  Date Time Provider [place]  02/01/2021  2:30 PM [name], MD [facility] None  05/29/2021  3:00 PM Liane Comber, NP [facility] None  10/04/2021  3:00 PM Liane Comber, NP [facility] None  Plan:   During the course of the visit the patient was educated and counseled about  appropriate screening and preventive services including:   Pneumococcal vaccine  Prevnar 13 Influenza vaccine Td vaccine Screening electrocardiogram Bone densitometry screening Colorectal cancer screening Diabetes screening Glaucoma screening Nutrition counseling  Advanced directives: requested   Subjective:  [name] [name] is a 87 y.o. female with hx of MI with 4V CABG in 2017 who presents for CPE and 3 month follow up. She has CKD stage 2 due to type 2 diabetes mellitus ([place]); Hyperlipidemia associated with type 2 diabetes mellitus ([place]); BMI 24.0-24.9, adult; Essential hypertension; GERD (gastroesophageal reflux disease); Hypothyroidism; Vitamin D deficiency; Medication management; CAD (coronary artery disease); S/P CABG x 4; Mild dementia ([place]); Type 2 diabetes mellitus ([place]); History of ischemic cardiomyopathy; Chronic anemia; Aortic atherosclerosis ([place]); and Major depressive disorder, single episode in full remission ([place]) on their problem list.  She has mild dementia previously managed by neurology Dr. Delice Lesch, no meds recommended, now following at our office. Alternates prevagen and neuriva. She follows up with son today. She lives by herself, son and several extended family members live close by and checks on her multiple times a day, they give meds.   She is living by herself since husband passed 10/2018, family is very close and checks daily. No longer driving, family prepares food, she can heat up in microwave. Has cane and rails in home where needed but hasn't needed recently, more stable, no falls.   Depression remains in remission off of lexapro.   BMI is Body mass index is 23.24 kg/m., she has not been working on diet and exercise, but generally active around the home. Weight is down since family member stoped bringing junk food, eating more regular food with good appetite -  Wt Readings from Last 3 Encounters:  10/04/20 119 lb (54 kg)  05/29/20 116 lb (52.6 kg)  07/19/19  137 lb (62.1 kg)   She has aortic atherosclerosis per CT chest 2017 She has hx of CAD with CABG x 4 in 04/2015; was following with Dr. Marlou Porch but last in 09/2018. not . Was on isosorbide 30 mg daily but got of med and never restarted due to no chest pain. She does continue with bASA.   She has mild aortic stenosis/ moderate aortic regurgitation with normal EF per ECHO 2020.    Her blood pressure has been controlled at home, today their BP is BP: 128/66 She does not workout but generally active. She denies chest pain, shortness of breath, dizziness.   She is on cholesterol medication (atorvastatin 80 mg daily) and denies myalgias. Her cholesterol is at goal. The cholesterol last visit was:   Lab Results  Component Value Date   CHOL 103 05/29/2020   HDL 41 (L) 05/29/2020   LDLCALC 46 05/29/2020   TRIG 78 05/29/2020   CHOLHDL 2.5 05/29/2020    She has not been working on diet and exercise for T2 diabetes controlled on metformin 500 mg 1-2 tabs daily, and denies foot ulcerations, hyperglycemia, hypoglycemia , increased appetite, nausea, polydipsia, polyuria, visual disturbances, vomiting and weight loss. Per son patient "hides" glucometer, has ordered new and son will start holding this. Previously was ranging 110-160.  Last A1C in the office was:  Lab Results  Component Value Date   HGBA1C 7.0 (H) 05/29/2020   She has hx of thyroid disease but has been borderline hyperthyroid, not on medications, and has been monitored closely:    Lab Results  Component Value Date   TSH 0.40 05/29/2020   She has CKD II associated with T2DM, is prescribed lisinopril 20 mg but stopped due to hypotension with syncope:  Lab Results  Component Value Date   GFRNONAA 59 (L) 05/29/2020   GFRNONAA 66 07/19/2019   GFRNONAA 69 05/25/2019   Patient has hx of Vit D deficiency on supplement and at goal at most recent check, taking vitamin D with K2:   Lab Results  Component Value Date   VD25OH 73 05/29/2020      Son isn't sure she has been on B12 supplement;  Lab Results  Component Value Date   VITAMINB12 300 06/24/2018   Lab Results  Component Value Date   FOLATE 3.3 (L) 06/24/2018   CBC Latest Ref Rng & Units 05/29/2020 07/19/2019 05/25/2019  WBC 3.8 - 10.8 Thousand/uL 5.8 5.5 4.7  Hemoglobin 11.7 - 15.5 g/dL 11.9 11.4(L) 11.3(L)  Hematocrit 35.0 - 45.0 % 35.7 34.0(L) 33.1(L)  Platelets 140 - 400 Thousand/uL 163 190 200       Medication Review: Current Outpatient Medications on File Prior to Visit  Medication Sig Dispense Refill   Apoaequorin (PREVAGEN PO) Take by mouth.     aspirin EC 81 MG tablet Take 1 tablet (81 mg total) by mouth daily. 90 tablet 3   atorvastatin (LIPITOR) 80 MG tablet Take 1 tablet (80 mg total) by mouth daily. for cholesterol 90 tablet 3   blood glucose meter kit and supplies KIT Dispense based on patient and insurance preference. Check blood sugar 1 time daily-[id number]. 1 each 0   metFORMIN (GLUCOPHAGE-XR) 500 MG 24 hr tablet Take 2 tablets 2 x /day with Meals for Diabetes 360 tablet 3   Misc Natural Products (NEURIVA PO) Take by mouth daily.     nitroGLYCERIN (NITROSTAT) 0.4 MG SL tablet Dissolve 1 tablet under tongue every 3 to 5 minutes if needed for ChestPain 50 tablet 12   senna ([employer] SENNA) 8.6 MG tablet TAKE 1 TABLET AT BEDTIME FOR CONSTIPATION 90 tablet 3   isosorbide mononitrate (IMDUR) 30 MG 24 hr tablet Take 1 tablet Daily for Heart (Patient not taking: Reported on 10/04/2020) 90 tablet 3   No current facility-administered medications on file prior to visit.    Allergies  Allergen Reactions   Bee Venom Anaphylaxis and Other (See Comments)    Eyes swell, cannot breathe   Persantine [Dipyridamole] Other (See Comments)    "went crazy"   Actos [Pioglitazone] Other (See Comments)    UNSPECIFIED REACTION     Current Problems (verified) Patient Active Problem List   Diagnosis Date Noted   Major depressive disorder, single episode in full remission  ([place]) 10/04/2020   Aortic atherosclerosis ([place]) 02/04/2019   Chronic anemia    History of ischemic cardiomyopathy 06/24/2018   Type 2 diabetes mellitus ([place]) 04/07/2017   Mild dementia ([place]) 07/26/2016   S/P CABG x 4    CAD (coronary artery disease) 11/16/2015   Medication management 07/27/2013   Vitamin D deficiency 02/01/2013   GERD (gastroesophageal reflux disease)    Hypothyroidism    CKD stage 2 due to type 2 diabetes mellitus ([place]) 06/04/2007   Hyperlipidemia associated with type 2 diabetes mellitus ([place]) 06/04/2007   BMI 24.0-24.9, adult 06/04/2007   Essential hypertension 06/04/2007    Immunization History  Administered Date(s) Administered   Influenza Inj Mdck Quad Pf 10/31/2016   Influenza Inj Mdck Quad With Preservative 11/11/2018   Influenza Split 10/03/2014   Influenza,inj,quad, With Preservative  10/31/2015   Influenza-Unspecified 11/18/2012   Pneumococcal Conjugate-13 11/04/2013   Pneumococcal Polysaccharide-23 07/25/2010   Pneumococcal-Unspecified 02/01/2001   Td 02/01/2006, 04/09/2017    Preventative care: Last colonoscopy: 2014 Last mammogram: 2007 - declines Last pap smear/pelvic exam: 2012, DONE DEXA: 2015 late osteopenia - declines  Prior vaccinations: TD or Tdap: 2019  Influenza: 11/2018 - declines, had reaction last  Pneumococcal: 2003, 2012 Prevnar13: 2015 Shingles/Zostavax: Check with insurance  Covid 19: patient declined   Names of Other Physician/Practitioners you currently use: 1. [name]- here for primary care 2. Dr. Katy [name], eye doctor, last visit ? 2021 - encouraged to schedule 3. Does not see, dentist, last visit unknown - full dentures  Patient Care Team: [name], MD as PCP - General (Internal Medicine) Jerline Pain, MD as PCP - Cardiology (Cardiology) Warden Fillers, MD as Consulting Physician (Optometry) Stanford Breed, Denice Bors, MD as Consulting Physician (Cardiology) Sable Feil, MD as Consulting Physician (Gastroenterology)  SURGICAL HISTORY She  has a past surgical history that includes Dilation and curettage of uterus (1984); Breast biopsy (Left, 1999); Colonoscopy (05/2012); Cardiac catheterization (N/A, 11/15/2015); TEE without cardioversion (N/A, 11/16/2015); and Coronary artery bypass graft (11/16/2015). FAMILY HISTORY Her family history includes Breast cancer in her maternal aunt and maternal grandmother; Diabetes in her father; Lung cancer in her brother; Prostate cancer in her brother; Stroke in her mother; Throat cancer in her maternal uncle. SOCIAL HISTORY She  reports that she has never smoked. Her smokeless tobacco use includes snuff. She reports that she does not drink alcohol and does not use drugs.   Review of Systems  Constitutional:  Negative for malaise/fatigue and weight loss.  HENT:  Positive for hearing loss (decline in the past 2 months). Negative for tinnitus.   Eyes:  Negative for blurred vision and double vision.  Respiratory:  Negative for cough, sputum production, shortness of breath and wheezing.   Cardiovascular:  Negative for chest pain, palpitations, orthopnea, claudication, leg swelling and PND.  Gastrointestinal:  Positive for constipation (intermittent, takes stool softener PRN). Negative for abdominal pain, blood in stool, diarrhea, heartburn, melena, nausea and vomiting.  Genitourinary: Negative.   Musculoskeletal:  Negative for falls, joint pain and myalgias.  Skin:  Negative for rash.  Neurological:  Negative for dizziness, tingling, sensory change, weakness and headaches.  Endo/Heme/Allergies:  Negative for polydipsia.  Psychiatric/Behavioral:  Positive for memory loss (stable/unchanged). Negative for depression, substance abuse and suicidal ideas. The patient is not nervous/anxious and does not have insomnia.   All other systems reviewed and are negative.   Objective:     Today's Vitals   10/04/20 1502  BP: 128/66   Pulse: 82  Temp: (!) 97.2 F (36.2 C)  SpO2: 99%  Weight: 119 lb (54 kg)  Height: 5' (1.524 m)   Body mass index is 23.24 kg/m.  General appearance: alert, no distress, WD/WN, elderly female HEENT: normocephalic, sclerae anicteric, Canals clear. TMs pearly, nares patent, no discharge or erythema, pharynx normal. HOH.  Oral cavity: MMM, no lesions, full dentures. No gland enlargement.  Neck: supple, no lymphadenopathy, no thyromegaly, no masses Heart: RRR, normal S1, S2, no murmur today  Lungs: CTA bilaterally, no wheezes, rhonchi, or rales Abdomen: +bs, soft, non tender, non distended, no masses, no hepatomegaly, no splenomegaly Musculoskeletal: nontender, no swelling, no obvious deformity Extremities: no edema, no cyanosis, no clubbing Pulses: 2+ symmetric, upper and lower extremities, normal cap refill Neurological: alert, oriented x 3, CN2-12 intact, strength normal  upper extremities and lower extremities, sensation normal throughout, DTRs 2+ throughout, no cerebellar signs, gait slow but normal Psychiatric: normal affect, behavior normal, pleasant  Breasts: breasts appear normal, pendulous, no suspicious masses, no skin or nipple changes or axillary nodes.   EKG: NSR, NSCPT   Izora Ribas, NP   10/04/2020

## 2020-10-04 ENCOUNTER — Ambulatory Visit (INDEPENDENT_AMBULATORY_CARE_PROVIDER_SITE_OTHER): Payer: Medicare Other | Admitting: Adult Health

## 2020-10-04 ENCOUNTER — Other Ambulatory Visit: Payer: Self-pay

## 2020-10-04 ENCOUNTER — Encounter: Payer: Self-pay | Admitting: Adult Health

## 2020-10-04 VITALS — BP 128/66 | HR 82 | Temp 97.2°F | Ht 60.0 in | Wt 119.0 lb

## 2020-10-04 DIAGNOSIS — N182 Chronic kidney disease, stage 2 (mild): Secondary | ICD-10-CM

## 2020-10-04 DIAGNOSIS — K219 Gastro-esophageal reflux disease without esophagitis: Secondary | ICD-10-CM

## 2020-10-04 DIAGNOSIS — E1169 Type 2 diabetes mellitus with other specified complication: Secondary | ICD-10-CM

## 2020-10-04 DIAGNOSIS — E039 Hypothyroidism, unspecified: Secondary | ICD-10-CM

## 2020-10-04 DIAGNOSIS — Z951 Presence of aortocoronary bypass graft: Secondary | ICD-10-CM | POA: Diagnosis not present

## 2020-10-04 DIAGNOSIS — F325 Major depressive disorder, single episode, in full remission: Secondary | ICD-10-CM | POA: Insufficient documentation

## 2020-10-04 DIAGNOSIS — Z0000 Encounter for general adult medical examination without abnormal findings: Secondary | ICD-10-CM | POA: Diagnosis not present

## 2020-10-04 DIAGNOSIS — Z136 Encounter for screening for cardiovascular disorders: Secondary | ICD-10-CM | POA: Diagnosis not present

## 2020-10-04 DIAGNOSIS — E785 Hyperlipidemia, unspecified: Secondary | ICD-10-CM

## 2020-10-04 DIAGNOSIS — E559 Vitamin D deficiency, unspecified: Secondary | ICD-10-CM

## 2020-10-04 DIAGNOSIS — Z0001 Encounter for general adult medical examination with abnormal findings: Secondary | ICD-10-CM

## 2020-10-04 DIAGNOSIS — I2510 Atherosclerotic heart disease of native coronary artery without angina pectoris: Secondary | ICD-10-CM

## 2020-10-04 DIAGNOSIS — Z6824 Body mass index (BMI) 24.0-24.9, adult: Secondary | ICD-10-CM

## 2020-10-04 DIAGNOSIS — F03A0 Unspecified dementia, mild, without behavioral disturbance, psychotic disturbance, mood disturbance, and anxiety: Secondary | ICD-10-CM

## 2020-10-04 DIAGNOSIS — I25118 Atherosclerotic heart disease of native coronary artery with other forms of angina pectoris: Secondary | ICD-10-CM

## 2020-10-04 DIAGNOSIS — H9193 Unspecified hearing loss, bilateral: Secondary | ICD-10-CM

## 2020-10-04 DIAGNOSIS — I10 Essential (primary) hypertension: Secondary | ICD-10-CM

## 2020-10-04 DIAGNOSIS — Z79899 Other long term (current) drug therapy: Secondary | ICD-10-CM

## 2020-10-04 DIAGNOSIS — E538 Deficiency of other specified B group vitamins: Secondary | ICD-10-CM

## 2020-10-04 DIAGNOSIS — F0390 Unspecified dementia without behavioral disturbance: Secondary | ICD-10-CM

## 2020-10-04 DIAGNOSIS — I700 Atherosclerosis of aorta: Secondary | ICD-10-CM

## 2020-10-04 DIAGNOSIS — D649 Anemia, unspecified: Secondary | ICD-10-CM

## 2020-10-04 DIAGNOSIS — E1122 Type 2 diabetes mellitus with diabetic chronic kidney disease: Secondary | ICD-10-CM

## 2020-10-04 MED ORDER — LISINOPRIL 2.5 MG PO TABS: ORAL_TABLET | ORAL | 3 refills | Status: DC

## 2020-10-04 NOTE — Patient Instructions (Addendum)
    Check with insurance about shingrix coverage - can get at pharmacy if well covered - 2 shots 2-6 months apart  Schedule eye exam and forward report if able   Zoster Vaccine, Recombinant injection What is this medication? ZOSTER VACCINE (ZOS ter vak SEEN) is a vaccine used to reduce the risk of getting shingles. This vaccine is not used to treat shingles or nerve pain from shingles. This medicine may be used for other purposes; ask your health care provider or pharmacist if you have questions. COMMON BRAND NAME(S): [facility] What should I tell my care team before I take this medication? They need to know if you have any of these conditions: cancer immune system problems an unusual or allergic reaction to Zoster vaccine, other medications, foods, dyes, or preservatives pregnant or trying to get pregnant breast-feeding How should I use this medication? This vaccine is injected into a muscle. It is given by a health care provider. A copy of Vaccine Information Statements will be given before each vaccination. Be sure to read this information carefully each time. This sheet may change often. Talk to your health care provider about the use of this vaccine in children. This vaccine is not approved for use in children. Overdosage: If you think you have taken too much of this medicine contact a poison control center or emergency room at once. NOTE: This medicine is only for you. Do not share this medicine with others. What if I miss a dose? Keep appointments for follow-up (booster) doses. It is important not to miss your dose. Call your health care provider if you are unable to keep an appointment. What may interact with this medication? medicines that suppress your immune system medicines to treat cancer steroid medicines like prednisone or cortisone This list may not describe all possible interactions. Give your health care provider a list of all the medicines, herbs, non-prescription  drugs, or dietary supplements you use. Also tell them if you smoke, drink alcohol, or use illegal drugs. Some items may interact with your medicine. What should I watch for while using this medication? Visit your health care provider regularly. This vaccine, like all vaccines, may not fully protect everyone. What side effects may I notice from receiving this medication? Side effects that you should report to your doctor or health care professional as soon as possible: allergic reactions (skin rash, itching or hives; swelling of the face, lips, or tongue) trouble breathing Side effects that usually do not require medical attention (report these to your doctor or health care professional if they continue or are bothersome): chills headache fever nausea pain, redness, or irritation at site where injected tiredness vomiting This list may not describe all possible side effects. Call your doctor for medical advice about side effects. You may report side effects to FDA at 1-800-FDA-1088. Where should I keep my medication? This vaccine is only given by a health care provider. It will not be stored at home. NOTE: This sheet is a summary. It may not cover all possible information. If you have questions about this medicine, talk to your doctor, pharmacist, or health care provider.  2022 Elsevier/Gold Standard (2019-01-29 16:23:07)

## 2020-10-06 ENCOUNTER — Other Ambulatory Visit: Payer: Self-pay

## 2020-10-06 MED ORDER — EPINEPHRINE 0.3 MG/0.3ML IJ SOSY: PREFILLED_SYRINGE | INTRAMUSCULAR | 1 refills | Status: AC

## 2020-10-07 LAB — CBC WITH DIFFERENTIAL/PLATELET
Absolute Monocytes: 958 cells/uL — ABNORMAL HIGH (ref 200–950)
Basophils Absolute: 23 cells/uL (ref 0–200)
Basophils Relative: 0.4 %
Eosinophils Absolute: 11 cells/uL — ABNORMAL LOW (ref 15–500)
Eosinophils Relative: 0.2 %
HCT: 35.9 % (ref 35.0–45.0)
Hemoglobin: 12 g/dL (ref 11.7–15.5)
Lymphs Abs: 2018 cells/uL (ref 850–3900)
MCH: 29.9 pg (ref 27.0–33.0)
MCHC: 33.4 g/dL (ref 32.0–36.0)
MCV: 89.5 fL (ref 80.0–100.0)
MPV: 10.5 fL (ref 7.5–12.5)
Monocytes Relative: 16.8 %
Neutro Abs: 2690 cells/uL (ref 1500–7800)
Neutrophils Relative %: 47.2 %
Platelets: 221 10*3/uL (ref 140–400)
RBC: 4.01 10*6/uL (ref 3.80–5.10)
RDW: 12.7 % (ref 11.0–15.0)
Total Lymphocyte: 35.4 %
WBC: 5.7 10*3/uL (ref 3.8–10.8)

## 2020-10-07 LAB — MICROALBUMIN / CREATININE URINE RATIO
Creatinine, Urine: 44 mg/dL (ref 20–275)
Microalb, Ur: <0.2 mg/dL

## 2020-10-07 LAB — HEMOGLOBIN A1C
Hgb A1c MFr Bld: 6.9 % of total Hgb — ABNORMAL HIGH (ref <5.7)
Mean Plasma Glucose: 151 mg/dL
eAG (mmol/L): 8.4 mmol/L

## 2020-10-07 LAB — COMPLETE METABOLIC PANEL WITH GFR
AG Ratio: 1.2 (calc) (ref 1.0–2.5)
ALT: 8 U/L (ref 6–29)
AST: 12 U/L (ref 10–35)
Albumin: 4.2 g/dL (ref 3.6–5.1)
Alkaline phosphatase (APISO): 45 U/L (ref 37–153)
BUN: 7 mg/dL (ref 7–25)
CO2: 30 mmol/L (ref 20–32)
Calcium: 9.6 mg/dL (ref 8.6–10.4)
Chloride: 103 mmol/L (ref 98–110)
Creat: 0.79 mg/dL (ref 0.60–0.95)
Globulin: 3.4 g/dL (calc) (ref 1.9–3.7)
Glucose, Bld: 132 mg/dL — ABNORMAL HIGH (ref 65–99)
Potassium: 4.6 mmol/L (ref 3.5–5.3)
Sodium: 141 mmol/L (ref 135–146)
Total Bilirubin: 0.6 mg/dL (ref 0.2–1.2)
Total Protein: 7.6 g/dL (ref 6.1–8.1)
eGFR: 72 mL/min/{1.73_m2} (ref >=60)

## 2020-10-07 LAB — LIPID PANEL
Cholesterol: 140 mg/dL (ref <200)
HDL: 54 mg/dL (ref >=50)
LDL Cholesterol (Calc): 64 mg/dL (calc)
Non-HDL Cholesterol (Calc): 86 mg/dL (calc) (ref <130)
Total CHOL/HDL Ratio: 2.6 (calc) (ref <5.0)
Triglycerides: 132 mg/dL (ref <150)

## 2020-10-07 LAB — URINALYSIS, ROUTINE W REFLEX MICROSCOPIC
Bilirubin Urine: NEGATIVE
Glucose, UA: NEGATIVE
Hgb urine dipstick: NEGATIVE
Ketones, ur: NEGATIVE
Leukocytes,Ua: NEGATIVE
Nitrite: NEGATIVE
Protein, ur: NEGATIVE
Specific Gravity, Urine: 1.006 (ref 1.001–1.035)
pH: 6.5 (ref 5.0–8.0)

## 2020-10-07 LAB — FOLATE RBC: RBC Folate: 627 ng/mL RBC (ref >280)

## 2020-10-07 LAB — TSH: TSH: 0.6 mIU/L (ref 0.40–4.50)

## 2020-10-07 LAB — MAGNESIUM: Magnesium: 2 mg/dL (ref 1.5–2.5)

## 2020-12-27 ENCOUNTER — Encounter: Payer: Self-pay | Admitting: Internal Medicine

## 2021-01-02 ENCOUNTER — Encounter: Payer: Medicare Other | Admitting: Internal Medicine

## 2021-01-09 DIAGNOSIS — I779 Disorder of arteries and arterioles, unspecified: Secondary | ICD-10-CM | POA: Insufficient documentation

## 2021-01-09 DIAGNOSIS — I359 Nonrheumatic aortic valve disorder, unspecified: Secondary | ICD-10-CM | POA: Insufficient documentation

## 2021-01-09 NOTE — Progress Notes (Signed)
+ Cardiology Office Note:    Date:  01/10/2021   ID:  [name] [name], DOB 1931-12-18, MRN [id number]  PCP:  [name], MD   [facility] Providers Cardiologist:  Candee Furbish, MD    Referring MD: Liane Comber, NP   Chief Complaint:  Follow-up    Patient Profile:   [name] [name] is a 88 y.o. female with:  Coronary artery disease  S/p CABG in 2017 Ischemic CM Intraop TEE: EF 25   Improved to normal (Echocardiogram 06/2018: EF 60-65) Aortic stenosis, aortic insufficiency  Echocardiogram 06/2018: mod AI, mild AS (mean 10) Carotid artery disease [place] 06/2018: R 40-59; L 1-39 Hypertension  Hx of hypotension during admission in 06/2018 Diabetes mellitus  Hyperlipidemia  Hypothyroidism  Diverticulosis  Dementia Aortic atherosclerosis    History of Present Illness: Ms. Schuff was last seen by Dr. Marlou Porch in 09/2018.  Due to low BP, her Verapamil was DC'd and she was advised to not start Lisinopril.   She returns for overdue f/u.    Since she was last seen by Dr. Marlou Porch in 2020 she has been doing very well.  She has not had any chest pain, shortness of breath, swelling in her legs, or fluttering in her chest.  She lives independently but has family check in on her regularly.  Her son lives across the street.  Every day she walks out to her mailbox which is roughly 300 feet.  3 days a week she walks across the street to her son's home which is about 1000 feet.  She uses an assist walker when she is walking longer distances and a cane when she is walking shorter distances.  My biggest worry today is her diet and hydration status.  Her son states that his mother is very picky with her diet and prefers more of the microwaved foods as well as cheese, peanut butter, and pudding.  She does enjoy fried okra and occasionally beef stew.  It sounds like she forgets to drink water throughout the day when she is by herself.  We discussed proper hydration and maintaining a heart healthy diet.  On  exam, she does have a early diastolic murmur consistent with aortic regurgitation.  She has not had any syncopal or presyncopal episodes.  She denies dizziness or lightheadedness.  Since she has not had an echocardiogram in over 2 years I think it would be wise to order an echocardiogram in August of this year before she sees Dr. Marlou Porch in September.  The patient and son are agreeable to this.  Reports no shortness of breath nor dyspnea on exertion. Reports no chest pain, pressure, or tightness. No edema, orthopnea, PND. Reports no palpitations.       ASSESSMENT & PLAN:    1.  CAD status post CABG -CABG x4 04/2015 by Dr. Roxan Hockey.  LIMA to LAD, SVG to ramus intermedius, SVG to OM, SVG to PDA. -EF improved from 25% up to 40% post bypass surgery -No current chest pain or SOB  2.  Diabetes mellitus type 2 -Hemoglobin A1c 6.1 -Continue current medication regimen  3.  Essential hypertension -Running low at times.  Her verapamil was discontinued  -She is on 2.64m of lisinopril  -She does not take her BP routinely at home-encouraged to take her BP a few times a week and write down these values.  -She has not had any dizziness or lightheadedness  4.  Hyperlipidemia -Continue atorvastatin 80 mg -Last LDL 64 and at goal (10/04/2020) -Continue  annual surveillance  5. Aortic stenosis and aortic regurgitation -Repeat echo in Aug before her appointment with Dr. Marlou Porch -Currently asymptomatic  6. Diabetes Mellitus Type 2 -A1C 6.9 9/22 -Continue carb modified/heart healthy diet -Continue to monitor your blood glucose closely -She cut out some of the sweets over the last few months and lost about 10 lbs -Diet education provided today during our visit  Dispo:  Return in about 9 months (around 10/10/2021) for Routine follow up in 9 months with Dr. Marlou Porch. .    Prior CV studies: Carotid US 06/27/2018 ICA 40-59, L ICA 1-39  Echocardiogram 06/26/2018 EF 60-65, normal RVSF, moderate AI, mild AAS  (mean 10, V-max 203.5 cm/s, DI 0.57)    Past Medical History:  Diagnosis Date   Anemia    Anginal pain ([place])    Cataract    Colon polyp 2009    TUBULAR ADENOMA   Coronary artery disease    Depression    Diabetes ([place])    Diverticulosis of colon (without mention of hemorrhage) 2009   Esophagitis 2009   Gastritis 2009   GERD (gastroesophageal reflux disease) 2009   Hiatal hernia 2009   Hyperlipemia    Hypertension    Thyroid disease    hypothyroid   Current Medications: Current Meds  Medication Sig   Apoaequorin (PREVAGEN PO) Take by mouth.   aspirin EC 81 MG tablet Take 1 tablet (81 mg total) by mouth daily.   blood glucose meter kit and supplies KIT Dispense based on patient and insurance preference. Check blood sugar 1 time daily-[id number].   EPINEPHrine 0.3 MG/0.3ML SOSY Inject into skin for anaphylaxis as needed   metFORMIN (GLUCOPHAGE-XR) 500 MG 24 hr tablet Take 2 tablets 2 x /day with Meals for Diabetes   Misc Natural Products (NEURIVA PO) Take by mouth daily.   senna ([employer] SENNA) 8.6 MG tablet TAKE 1 TABLET AT BEDTIME FOR CONSTIPATION   [DISCONTINUED] atorvastatin (LIPITOR) 80 MG tablet Take 1 tablet (80 mg total) by mouth daily. for cholesterol   [DISCONTINUED] isosorbide mononitrate (IMDUR) 30 MG 24 hr tablet Take 1 tablet Daily for Heart   [DISCONTINUED] lisinopril (ZESTRIL) 2.5 MG tablet Take 1 tablet Daily for BP & Diabetic Kidney Protection   [DISCONTINUED] nitroGLYCERIN (NITROSTAT) 0.4 MG SL tablet Dissolve 1 tablet under tongue every 3 to 5 minutes if needed for ChestPain    Allergies:   Bee venom, Persantine [dipyridamole], and Actos [pioglitazone]   Social History   Tobacco Use   Smoking status: Never   Smokeless tobacco: Current    Types: Snuff  Vaping Use   Vaping Use: Never used  Substance Use Topics   Alcohol use: No   Drug use: No    Family Hx: The patient's family history includes Breast cancer in her maternal aunt and maternal grandmother;  Diabetes in her father; Lung cancer in her brother; Prostate cancer in her brother; Stroke in her mother; Throat cancer in her maternal uncle. There is no history of Rectal cancer, Stomach cancer, Colon cancer, or Esophageal cancer.    EKGs/Labs/Other Test Reviewed:    EKG:  EKG is not ordered today.    Recent Labs: 10/04/2020: ALT 8; BUN 7; Creat 0.79; Hemoglobin 12.0; Magnesium 2.0; Platelets 221; Potassium 4.6; Sodium 141; TSH 0.60   Recent Lipid Panel Lab Results  Component Value Date/Time   CHOL 140 10/04/2020 04:30 PM   TRIG 132 10/04/2020 04:30 PM   HDL 54 10/04/2020 04:30 PM   LDLCALC 64 10/04/2020 04:30 PM  Physical Exam:    VS:  BP 114/66    Pulse (!) 116    Ht 5' 2.5" (1.588 m)    Wt 109 lb (49.4 kg)    LMP  (LMP Unknown)    SpO2 98%    BMI 19.62 kg/m     Wt Readings from Last 3 Encounters:  01/10/21 109 lb (49.4 kg)  10/04/20 119 lb (54 kg)  05/29/20 116 lb (52.6 kg)   Physical Exam Constitutional:      Appearance: Normal appearance.  Cardiovascular:     Rate and Rhythm: Normal rate and regular rhythm.     Heart sounds: Murmur (early diastolic 5/6) heard.  Pulmonary:     Effort: Pulmonary effort is normal.     Breath sounds: Normal breath sounds.  Neurological:     General: No focal deficit present.     Mental Status: She is alert. Mental status is at baseline.       Medication Adjustments/Labs and Tests Ordered: Current medicines are reviewed at length with the patient today.  Concerns regarding medicines are outlined above.  Tests Ordered: Orders Placed This Encounter  Procedures   ECHOCARDIOGRAM COMPLETE   Medication Changes: Meds ordered this encounter  Medications   atorvastatin (LIPITOR) 80 MG tablet    Sig: Take 1 tablet (80 mg total) by mouth daily. for cholesterol    Dispense:  90 tablet    Refill:  3   isosorbide mononitrate (IMDUR) 30 MG 24 hr tablet    Sig: Take 1 tablet Daily for Heart    Dispense:  90 tablet    Refill:  3     Patient knows to take by mouth.   lisinopril (ZESTRIL) 2.5 MG tablet    Sig: Take 1 tablet Daily for BP & Diabetic Kidney Protection    Dispense:  90 tablet    Refill:  3   nitroGLYCERIN (NITROSTAT) 0.4 MG SL tablet    Sig: Dissolve 1 tablet under tongue every 3 to 5 minutes if needed for ChestPain    Dispense:  50 tablet    Refill:  12    Please dispense # 2 bottles of #25 tablets   Signed, Elgie Collard, PA-C  01/10/2021 2:26 PM    [address], [place], [place]  [address] Phone: [phone]; Fax: [phone]

## 2021-01-10 ENCOUNTER — Ambulatory Visit (INDEPENDENT_AMBULATORY_CARE_PROVIDER_SITE_OTHER): Payer: Medicare Other | Admitting: Physician Assistant

## 2021-01-10 ENCOUNTER — Encounter: Payer: Self-pay | Admitting: Physician Assistant

## 2021-01-10 ENCOUNTER — Other Ambulatory Visit: Payer: Self-pay

## 2021-01-10 VITALS — BP 114/66 | HR 116 | Ht 62.5 in | Wt 109.0 lb

## 2021-01-10 DIAGNOSIS — I359 Nonrheumatic aortic valve disorder, unspecified: Secondary | ICD-10-CM

## 2021-01-10 DIAGNOSIS — I351 Nonrheumatic aortic (valve) insufficiency: Secondary | ICD-10-CM

## 2021-01-10 DIAGNOSIS — N182 Chronic kidney disease, stage 2 (mild): Secondary | ICD-10-CM

## 2021-01-10 DIAGNOSIS — I700 Atherosclerosis of aorta: Secondary | ICD-10-CM

## 2021-01-10 DIAGNOSIS — I10 Essential (primary) hypertension: Secondary | ICD-10-CM

## 2021-01-10 DIAGNOSIS — I2510 Atherosclerotic heart disease of native coronary artery without angina pectoris: Secondary | ICD-10-CM

## 2021-01-10 DIAGNOSIS — E1169 Type 2 diabetes mellitus with other specified complication: Secondary | ICD-10-CM

## 2021-01-10 DIAGNOSIS — E1122 Type 2 diabetes mellitus with diabetic chronic kidney disease: Secondary | ICD-10-CM

## 2021-01-10 DIAGNOSIS — I6523 Occlusion and stenosis of bilateral carotid arteries: Secondary | ICD-10-CM

## 2021-01-10 DIAGNOSIS — E785 Hyperlipidemia, unspecified: Secondary | ICD-10-CM

## 2021-01-10 MED ORDER — ISOSORBIDE MONONITRATE ER 30 MG PO TB24: ORAL_TABLET | ORAL | 3 refills | Status: AC

## 2021-01-10 MED ORDER — LISINOPRIL 2.5 MG PO TABS: ORAL_TABLET | ORAL | 3 refills | Status: AC

## 2021-01-10 MED ORDER — ATORVASTATIN CALCIUM 80 MG PO TABS: 80.0000 mg | ORAL_TABLET | Freq: Every day | ORAL | 3 refills | Status: AC

## 2021-01-10 MED ORDER — NITROGLYCERIN 0.4 MG SL SUBL: SUBLINGUAL_TABLET | SUBLINGUAL | 12 refills | Status: AC

## 2021-01-10 NOTE — Patient Instructions (Signed)
Medication Instructions:   Your physician recommends that you continue on your current medications as directed. Please refer to the Current Medication list given to you today.  *If you need a refill on your cardiac medications before your next appointment, please call your pharmacy*   Lab Work:  -NONE  If you have labs (blood work) drawn today and your tests are completely normal, you will receive your results only by: [place] (if you have MyChart) OR A paper copy in the mail If you have any lab test that is abnormal or we need to change your treatment, we will call you to review the results.   Testing/Procedures:  Your physician has requested that you have an echocardiogram. Echocardiography is a painless test that uses sound waves to create images of your heart. It provides your doctor with information about the size and shape of your heart and how well your hearts chambers and valves are working. This procedure takes approximately one hour. There are no restrictions for this procedure.    Follow-Up: At [facility], you and your health needs are our priority.  As part of our continuing mission to provide you with exceptional heart care, we have created designated Provider Care Teams.  These Care Teams include your primary Cardiologist (physician) and Advanced Practice Providers (APPs -  Physician Assistants and Nurse Practitioners) who all work together to provide you with the care you need, when you need it.  We recommend signing up for the patient portal called "MyChart".  Sign up information is provided on this After Visit Summary.  MyChart is used to connect with patients for Virtual Visits (Telemedicine).  Patients are able to view lab/test results, encounter notes, upcoming appointments, etc.  Non-urgent messages can be sent to your provider as well.   To learn more about what you can do with MyChart, go to [email or web address].    Your next appointment:   9  month(s)  The format for your next appointment:   In Person  Provider:   Candee Furbish, MD     Other Instructions Your physician wants you to follow-up in: 9 month with Dr. Marlou Porch.  You will receive a reminder letter in the mail two months in advance. If you don't receive a letter, please call our office to schedule the follow-up appointment.   Please stay hydrated drinking 64 0z of water daily.

## 2021-02-01 ENCOUNTER — Ambulatory Visit (INDEPENDENT_AMBULATORY_CARE_PROVIDER_SITE_OTHER): Payer: Medicare Other | Admitting: Internal Medicine

## 2021-02-01 ENCOUNTER — Other Ambulatory Visit: Payer: Self-pay

## 2021-02-01 ENCOUNTER — Encounter: Payer: Self-pay | Admitting: Internal Medicine

## 2021-02-01 VITALS — BP 104/70 | HR 81 | Temp 97.9°F | Resp 17 | Ht 61.5 in | Wt 104.6 lb

## 2021-02-01 DIAGNOSIS — E1122 Type 2 diabetes mellitus with diabetic chronic kidney disease: Secondary | ICD-10-CM | POA: Diagnosis not present

## 2021-02-01 DIAGNOSIS — E039 Hypothyroidism, unspecified: Secondary | ICD-10-CM

## 2021-02-01 DIAGNOSIS — E785 Hyperlipidemia, unspecified: Secondary | ICD-10-CM

## 2021-02-01 DIAGNOSIS — I10 Essential (primary) hypertension: Secondary | ICD-10-CM

## 2021-02-01 DIAGNOSIS — E559 Vitamin D deficiency, unspecified: Secondary | ICD-10-CM

## 2021-02-01 DIAGNOSIS — E1169 Type 2 diabetes mellitus with other specified complication: Secondary | ICD-10-CM | POA: Diagnosis not present

## 2021-02-01 DIAGNOSIS — N182 Chronic kidney disease, stage 2 (mild): Secondary | ICD-10-CM

## 2021-02-01 DIAGNOSIS — Z79899 Other long term (current) drug therapy: Secondary | ICD-10-CM

## 2021-02-01 DIAGNOSIS — Z951 Presence of aortocoronary bypass graft: Secondary | ICD-10-CM

## 2021-02-01 DIAGNOSIS — I700 Atherosclerosis of aorta: Secondary | ICD-10-CM

## 2021-02-01 NOTE — Patient Instructions (Addendum)

## 2021-02-01 NOTE — Progress Notes (Signed)
Future Appointments  Date Time Provider Department  02/01/2021  2:30 PM [name], MD [facility]  05/29/2021     CPE  3:00 PM Liane Comber, NP [facility]  10/04/2021   Wellness   3:00 PM Liane Comber, NP [facility]    History of Present Illness:       This very nice 88 y.o. WWF presents follow up with HTN, ASCAD, HLD, T2_NIDDM, Vascular Dementia  and Vitamin D Deficiency. Patient is brought in by her caretaker son, [name]        Patient is treated for HTN (1991) & BP has been controlled at home.  In 2017 , she presented with Acute MI & underwent CABG.   Todays BP is at goal - 104/70. Patient has had no complaints of any cardiac type chest pain, palpitations, dyspnea / orthopnea / PND, dizziness, claudication, or dependent edema.       Hyperlipidemia is controlled with diet & Atorvastatin . Patient denies myalgias or other med SEs. Last Lipids were at goal :  Lab Results  Component Value Date   CHOL 140 10/04/2020   HDL  54 10/04/2020   LDLCALC 64 10/04/2020   TRIG 132 10/04/2020   CHOLHDL 2.6 10/04/2020         Patient has been monitored several years for intermittent mild occult or sub-clinical Hyperthyroidism.   Also, the patient has history of T2_NIDDM (1996) w/CKD2  and has had no symptoms of reactive hypoglycemia, diabetic polys, paresthesias or visual blurring.  Patient is off of diabetic meds since weight loss. Last A1c was not at goal :   Lab Results  Component Value Date   HGBA1C 6.9 (H) 10/04/2020                                                          Further, the patient also has history of Vitamin D Deficiency ("27" /2008) and supplements vitamin D without any suspected side-effects. Last vitamin D was at goal :   Lab Results  Component Value Date   VD25OH 82 05/29/2020     Current Outpatient Medications on File Prior to Visit  Medication Sig   Apoaequorin (PREVAGEN PO) Take daily   aspirin EC 81 MG tablet Take 1 tablet  daily.    atorvastatin  80 MG tablet Take 1 tablet daily. for cholesterol   EPINEPHrine 0.3 MG/0.3ML SOSY Inject into skin for anaphylaxis as needed   IMDUR 30 MG 24 hr tablet Take 1 tablet Daily for Heart   lisinopril  2.5 MG tablet Take 1 tablet Daily    metFORMIN-XR 500 MG  Take 2 tablets 2 x /day with Meals    NEURIVA  Take  daily.   NITROSTAT  0.4 MG SL tablet if needed for Chest Pain   Senna  8.6 MG tablet TAKE 1 TABLET AT BEDTIME      Allergies  Allergen Reactions   Bee Venom Anaphylaxis and Eyes swell, cannot breathe       Persantine [Dipyridamole] "went crazy"       Actos [Pioglitazone] ? rxn     PMHx:   Past Medical History:  Diagnosis Date   Anemia    Anginal pain ([place])    Cataract    Colon polyp 2009    TUBULAR ADENOMA   Coronary artery disease  Depression    Diabetes ([place])    Diverticulosis of colon (without mention of hemorrhage) 2009   Esophagitis 2009   Gastritis 2009   GERD (gastroesophageal reflux disease) 2009   Hiatal hernia 2009   Hyperlipemia    Hypertension    Thyroid disease    hypothyroid     Immunization History  Administered Date(s) Administered   Influenza Inj Mdck Quad  10/31/2016   Influenza Inj Mdck Quad  11/11/2018   Influenza Split 10/03/2014   Influenza,inj,quad 10/31/2015   Influenza 11/18/2012   Pneumococcal -13 11/04/2013   Pneumococcal -23 07/25/2010   Pneumococcal-23 02/01/2001   Td 02/01/2006, 04/09/2017     Past Surgical History:  Procedure Laterality Date   BREAST BIOPSY Left 1999   neg   CARDIAC CATHETERIZATION N/A 11/15/2015   Procedure: Left Heart Cath and Coronary Angiography;  Surgeon: Troy Sine, MD;  Location: [facility];  Service: Cardiovascular;  Laterality: N/A;   COLONOSCOPY  05/2012   Dr. Sharlett Iles   CORONARY ARTERY BYPASS GRAFT  11/16/2015   Procedure: OFF PUMP CORONARY ARTERY BYPASS GRAFTING (CABG) x FOUR, USING LEFT MAMMARY ARTERY  AND ENDOSCOPICALLY HARVESTED RIGHT GREATER SAPHENOUS VEIN.;   Surgeon: Melrose Nakayama, MD;  Location: [facility];  Service: Open Heart Surgery;;   DILATION AND CURETTAGE OF UTERUS  1984   TEE WITHOUT CARDIOVERSION N/A 11/16/2015   Procedure: TRANSESOPHAGEAL ECHOCARDIOGRAM (TEE);  Surgeon: Melrose Nakayama, MD;  Location: [facility];  Service: Open Heart Surgery;  Laterality: N/A;    FHx:    Reviewed / unchanged  SHx:    Reviewed / unchanged   Systems Review:  Constitutional: Denies fever, chills, wt changes, headaches, insomnia, fatigue, night sweats, change in appetite. Eyes: Denies redness, blurred vision, diplopia, discharge, itchy, watery eyes.  ENT: Denies discharge, congestion, post nasal drip, epistaxis, sore throat, earache, hearing loss, dental pain, tinnitus, vertigo, sinus pain, snoring.  CV: Denies chest pain, palpitations, irregular heartbeat, syncope, dyspnea, diaphoresis, orthopnea, PND, claudication or edema. Respiratory: denies cough, dyspnea, DOE, pleurisy, hoarseness, laryngitis, wheezing.  Gastrointestinal: Denies dysphagia, odynophagia, heartburn, reflux, water brash, abdominal pain or cramps, nausea, vomiting, bloating, diarrhea, constipation, hematemesis, melena, hematochezia  or hemorrhoids. Genitourinary: Denies dysuria, frequency, urgency, nocturia, hesitancy, discharge, hematuria or flank pain. Musculoskeletal: Denies arthralgias, myalgias, stiffness, jt. swelling, pain, limping or strain/sprain.  Skin: Denies pruritus, rash, hives, warts, acne, eczema or change in skin lesion(s). Neuro: No weakness, tremor, incoordination, spasms, paresthesia or pain. Psychiatric: Denies confusion, memory loss or sensory loss. Endo: Denies change in weight, skin or hair change.  Heme/Lymph: No excessive bleeding, bruising or enlarged lymph nodes.  Physical Exam  BP 104/70    Pulse 81    Temp 97.9 F (36.6 C)    Resp 17    Ht 5' 1.5" (1.562 m)    Wt 104 lb 9.6 oz (47.4 kg)    LMP  (LMP Unknown)    SpO2 98%    BMI 19.44 kg/m   Appears   Chronically ill appearing elderly WF  in no distress.  Eyes: PERRLA, EOMs, conjunctiva no swelling or erythema. Sinuses: No frontal/maxillary tenderness ENT/Mouth: EAC's clear, TM's nl w/o erythema, bulging. Nares clear w/o erythema, swelling, exudates. Oropharynx clear without erythema or exudates. Oral hygiene is good. Tongue normal, non obstructing. Hearing intact.  Neck: Supple. Thyroid not palpable. Car 2+/2+ without bruits, nodes or JVD. Chest: Respirations nl with BS clear & equal w/o rales, rhonchi, wheezing or stridor.  Cor: Heart sounds normal  w/ regular rate and rhythm without sig. murmurs, gallops, clicks or rubs. Peripheral pulses normal and equal  without edema.  Abdomen: Soft & bowel sounds normal. Non-tender w/o guarding, rebound, hernias, masses or organomegaly.  Lymphatics: Unremarkable.  Musculoskeletal: Full ROM all peripheral extremities, joint stability, 5/5 strength and normal gait.  Skin: Warm, dry without exposed rashes, lesions or ecchymosis apparent.  Neuro: Cranial nerves intact, reflexes equal bilaterally. Sensory-motor testing grossly intact. Tendon reflexes grossly intact.  Pysch: Alert & oriented x 3.  Insight and judgement nl & appropriate. No ideations.  Assessment and Plan:   1. Essential hypertension  - Continue medication, monitor blood pressure at home.  - Continue DASH diet.  Reminder to go to the ER if any CP,  SOB, nausea, dizziness, severe HA, changes vision/speech.   - CBC with Differential/Platelet - COMPLETE METABOLIC PANEL WITH GFR - Magnesium - TSH  2. Hyperlipidemia associated with type 2 diabetes mellitus ([place])  - Continue diet/meds, exercise,& lifestyle modifications.  - Continue monitor periodic cholesterol/liver & renal functions    - Lipid panel - TSH  3. Type 2 diabetes mellitus with stage 2 chronic kidney  disease, without long-term current use of insulin (HCC)  - Continue diet, exercise  - Lifestyle modifications.  -  Monitor appropriate labs  - Re-Add mirtazapine for appetite stimulation  - Hemoglobin A1c - Insulin, random  4. Vitamin D deficiency  - Continue supplementation  - VITAMIN D 25 Hydroxy  5. Aortic atherosclerosis (HCC)  - Lipid panel  6. S/P CABG x 4   7. Hypothyroidism  - TSH  8. Medication management  - CBC with Differential/Platelet - COMPLETE METABOLIC PANEL WITH GFR - Magnesium - Lipid panel - TSH - Hemoglobin A1c - Insulin, random - VITAMIN D 25 Hydroxy          Discussed  regular exercise, BP monitoring, weight control to achieve/maintain BMI less than 25 and discussed med and SE's. Recommended labs to assess and monitor clinical status with further disposition pending results of labs.  I discussed the assessment and treatment plan with the patient. The patient was provided an opportunity to ask questions and all were answered. The patient agreed with the plan and demonstrated an understanding of the instructions.  I provided over 30 minutes of exam, counseling, chart review and  complex critical decision making.        The patient was advised to call back or seek an in-person evaluation if the symptoms worsen or if the condition fails to improve as anticipated.   Kirtland Bouchard, MD \

## 2021-02-02 LAB — CBC WITH DIFFERENTIAL/PLATELET
Absolute Monocytes: 648 {cells}/uL (ref 200–950)
Basophils Absolute: 10 {cells}/uL (ref 0–200)
Basophils Relative: 0.2 %
Eosinophils Absolute: 10 {cells}/uL — ABNORMAL LOW (ref 15–500)
Eosinophils Relative: 0.2 %
HCT: 34 % — ABNORMAL LOW (ref 35.0–45.0)
Hemoglobin: 12 g/dL (ref 11.7–15.5)
Lymphs Abs: 1910 {cells}/uL (ref 850–3900)
MCH: 30.8 pg (ref 27.0–33.0)
MCHC: 35.3 g/dL (ref 32.0–36.0)
MCV: 87.4 fL (ref 80.0–100.0)
MPV: 10.1 fL (ref 7.5–12.5)
Monocytes Relative: 13.5 %
Neutro Abs: 2222 {cells}/uL (ref 1500–7800)
Neutrophils Relative %: 46.3 %
Platelets: 205 10*3/uL (ref 140–400)
RBC: 3.89 Million/uL (ref 3.80–5.10)
RDW: 12.5 % (ref 11.0–15.0)
Total Lymphocyte: 39.8 %
WBC: 4.8 10*3/uL (ref 3.8–10.8)

## 2021-02-02 LAB — LIPID PANEL
Cholesterol: 119 mg/dL (ref <200)
HDL: 44 mg/dL — ABNORMAL LOW (ref >=50)
LDL Cholesterol (Calc): 59 mg/dL (calc)
Non-HDL Cholesterol (Calc): 75 mg/dL (calc) (ref <130)
Total CHOL/HDL Ratio: 2.7 (calc) (ref <5.0)
Triglycerides: 82 mg/dL (ref <150)

## 2021-02-02 LAB — COMPLETE METABOLIC PANEL WITH GFR
AG Ratio: 1.3 (calc) (ref 1.0–2.5)
ALT: 10 U/L (ref 6–29)
AST: 16 U/L (ref 10–35)
Albumin: 4.4 g/dL (ref 3.6–5.1)
Alkaline phosphatase (APISO): 34 U/L — ABNORMAL LOW (ref 37–153)
BUN/Creatinine Ratio: 10 (calc) (ref 6–22)
BUN: 11 mg/dL (ref 7–25)
CO2: 26 mmol/L (ref 20–32)
Calcium: 9.7 mg/dL (ref 8.6–10.4)
Chloride: 92 mmol/L — ABNORMAL LOW (ref 98–110)
Creat: 1.05 mg/dL — ABNORMAL HIGH (ref 0.60–0.95)
Globulin: 3.3 g/dL (calc) (ref 1.9–3.7)
Glucose, Bld: 133 mg/dL — ABNORMAL HIGH (ref 65–99)
Potassium: 4.2 mmol/L (ref 3.5–5.3)
Sodium: 130 mmol/L — ABNORMAL LOW (ref 135–146)
Total Bilirubin: 1.1 mg/dL (ref 0.2–1.2)
Total Protein: 7.7 g/dL (ref 6.1–8.1)
eGFR: 51 mL/min/{1.73_m2} — ABNORMAL LOW (ref >=60)

## 2021-02-02 LAB — INSULIN, RANDOM: Insulin: 5.6 u[IU]/mL

## 2021-02-02 LAB — TSH: TSH: 0.45 m[IU]/L (ref 0.40–4.50)

## 2021-02-02 LAB — VITAMIN D 25 HYDROXY (VIT D DEFICIENCY, FRACTURES): Vit D, 25-Hydroxy: 86 ng/mL (ref 30–100)

## 2021-02-02 LAB — HEMOGLOBIN A1C
Hgb A1c MFr Bld: 6.7 % of total Hgb — ABNORMAL HIGH (ref <5.7)
Mean Plasma Glucose: 146 mg/dL
eAG (mmol/L): 8.1 mmol/L

## 2021-02-02 LAB — MAGNESIUM: Magnesium: 1.6 mg/dL (ref 1.5–2.5)

## 2021-02-02 MED ORDER — MIRTAZAPINE 15 MG PO TABS: ORAL_TABLET | ORAL | 3 refills | Status: AC

## 2021-02-03 NOTE — Progress Notes (Signed)
=============================================================== °-   Test results slightly outside the reference range are not unusual. If there is anything important, I will review this with you,  otherwise it is considered normal test values.  If you have further questions,  please do not hesitate to contact me at the office or via My Chart.  =============================================================== ===============================================================  -  Total Chol = 119  & LDL Chol = 59  - Both   Excellent   - Very low risk for Heart Attack  / Stroke ============================================================ ============================================================  -  A1c = 6.7% - Still in low Diabetic range   - But not dangerously high  =============================================================== ===============================================================  -   Magnesium  -     -  very  low- goal is betw 2.0 - 2.5,   - So..............Marland Kitchen  Recommend that you take                                                                Magnesium 500 mg tablet daily   - also important to eat lots of  leafy green vegetables   - spinach - Kale - collards - greens - okra - asparagus  - broccoli - quinoa - squash - almonds   - black, red, white beans  -  peas - green beans =============================================================== ===============================================================  -  Vitamin D = 86  - Excellent   =============================================================== ===============================================================  -  All Else - CBC - Kidneys - Electrolytes - Liver - Magnesium & Thyroid    - all  Normal / OK ==============================================================  ==============================================================

## 2021-02-11 ENCOUNTER — Inpatient Hospital Stay (HOSPITAL_COMMUNITY)
Admission: EM | Admit: 2021-02-11 | Discharge: 2021-03-07 | DRG: 682 | Disposition: E | Payer: Medicare Other | Attending: Internal Medicine | Admitting: Internal Medicine

## 2021-02-11 ENCOUNTER — Other Ambulatory Visit: Payer: Self-pay

## 2021-02-11 ENCOUNTER — Emergency Department (HOSPITAL_COMMUNITY): Payer: Medicare Other

## 2021-02-11 DIAGNOSIS — Z9181 History of falling: Secondary | ICD-10-CM

## 2021-02-11 DIAGNOSIS — I10 Essential (primary) hypertension: Secondary | ICD-10-CM | POA: Diagnosis present

## 2021-02-11 DIAGNOSIS — N170 Acute kidney failure with tubular necrosis: Principal | ICD-10-CM | POA: Diagnosis present

## 2021-02-11 DIAGNOSIS — E039 Hypothyroidism, unspecified: Secondary | ICD-10-CM | POA: Diagnosis present

## 2021-02-11 DIAGNOSIS — E861 Hypovolemia: Secondary | ICD-10-CM | POA: Diagnosis present

## 2021-02-11 DIAGNOSIS — Z452 Encounter for adjustment and management of vascular access device: Secondary | ICD-10-CM

## 2021-02-11 DIAGNOSIS — R52 Pain, unspecified: Secondary | ICD-10-CM | POA: Diagnosis not present

## 2021-02-11 DIAGNOSIS — I214 Non-ST elevation (NSTEMI) myocardial infarction: Secondary | ICD-10-CM | POA: Diagnosis not present

## 2021-02-11 DIAGNOSIS — S0101XA Laceration without foreign body of scalp, initial encounter: Secondary | ICD-10-CM | POA: Diagnosis present

## 2021-02-11 DIAGNOSIS — Z833 Family history of diabetes mellitus: Secondary | ICD-10-CM

## 2021-02-11 DIAGNOSIS — R778 Other specified abnormalities of plasma proteins: Secondary | ICD-10-CM | POA: Diagnosis present

## 2021-02-11 DIAGNOSIS — Z951 Presence of aortocoronary bypass graft: Secondary | ICD-10-CM

## 2021-02-11 DIAGNOSIS — Z681 Body mass index (BMI) 19 or less, adult: Secondary | ICD-10-CM

## 2021-02-11 DIAGNOSIS — J189 Pneumonia, unspecified organism: Secondary | ICD-10-CM

## 2021-02-11 DIAGNOSIS — R1031 Right lower quadrant pain: Secondary | ICD-10-CM

## 2021-02-11 DIAGNOSIS — E785 Hyperlipidemia, unspecified: Secondary | ICD-10-CM | POA: Diagnosis present

## 2021-02-11 DIAGNOSIS — Z91030 Bee allergy status: Secondary | ICD-10-CM

## 2021-02-11 DIAGNOSIS — K8020 Calculus of gallbladder without cholecystitis without obstruction: Secondary | ICD-10-CM | POA: Diagnosis present

## 2021-02-11 DIAGNOSIS — D6489 Other specified anemias: Secondary | ICD-10-CM | POA: Diagnosis present

## 2021-02-11 DIAGNOSIS — T17908A Unspecified foreign body in respiratory tract, part unspecified causing other injury, initial encounter: Secondary | ICD-10-CM

## 2021-02-11 DIAGNOSIS — Z7982 Long term (current) use of aspirin: Secondary | ICD-10-CM

## 2021-02-11 DIAGNOSIS — E43 Unspecified severe protein-calorie malnutrition: Secondary | ICD-10-CM | POA: Diagnosis present

## 2021-02-11 DIAGNOSIS — W19XXXA Unspecified fall, initial encounter: Secondary | ICD-10-CM | POA: Diagnosis present

## 2021-02-11 DIAGNOSIS — E1122 Type 2 diabetes mellitus with diabetic chronic kidney disease: Secondary | ICD-10-CM | POA: Diagnosis not present

## 2021-02-11 DIAGNOSIS — H5704 Mydriasis: Secondary | ICD-10-CM | POA: Diagnosis not present

## 2021-02-11 DIAGNOSIS — I469 Cardiac arrest, cause unspecified: Secondary | ICD-10-CM | POA: Diagnosis not present

## 2021-02-11 DIAGNOSIS — A419 Sepsis, unspecified organism: Secondary | ICD-10-CM | POA: Diagnosis not present

## 2021-02-11 DIAGNOSIS — F03B0 Unspecified dementia, moderate, without behavioral disturbance, psychotic disturbance, mood disturbance, and anxiety: Secondary | ICD-10-CM | POA: Diagnosis present

## 2021-02-11 DIAGNOSIS — Z20822 Contact with and (suspected) exposure to covid-19: Secondary | ICD-10-CM | POA: Diagnosis present

## 2021-02-11 DIAGNOSIS — K219 Gastro-esophageal reflux disease without esophagitis: Secondary | ICD-10-CM | POA: Diagnosis present

## 2021-02-11 DIAGNOSIS — G9341 Metabolic encephalopathy: Secondary | ICD-10-CM | POA: Diagnosis present

## 2021-02-11 DIAGNOSIS — E860 Dehydration: Secondary | ICD-10-CM | POA: Diagnosis present

## 2021-02-11 DIAGNOSIS — E871 Hypo-osmolality and hyponatremia: Secondary | ICD-10-CM | POA: Diagnosis not present

## 2021-02-11 DIAGNOSIS — K7200 Acute and subacute hepatic failure without coma: Secondary | ICD-10-CM | POA: Diagnosis not present

## 2021-02-11 DIAGNOSIS — R011 Cardiac murmur, unspecified: Secondary | ICD-10-CM | POA: Diagnosis present

## 2021-02-11 DIAGNOSIS — F03A0 Unspecified dementia, mild, without behavioral disturbance, psychotic disturbance, mood disturbance, and anxiety: Secondary | ICD-10-CM | POA: Diagnosis present

## 2021-02-11 DIAGNOSIS — E1169 Type 2 diabetes mellitus with other specified complication: Secondary | ICD-10-CM | POA: Diagnosis present

## 2021-02-11 DIAGNOSIS — E875 Hyperkalemia: Secondary | ICD-10-CM | POA: Diagnosis not present

## 2021-02-11 DIAGNOSIS — R112 Nausea with vomiting, unspecified: Secondary | ICD-10-CM

## 2021-02-11 DIAGNOSIS — R570 Cardiogenic shock: Secondary | ICD-10-CM | POA: Diagnosis not present

## 2021-02-11 DIAGNOSIS — F17290 Nicotine dependence, other tobacco product, uncomplicated: Secondary | ICD-10-CM | POA: Diagnosis present

## 2021-02-11 DIAGNOSIS — Z7984 Long term (current) use of oral hypoglycemic drugs: Secondary | ICD-10-CM

## 2021-02-11 DIAGNOSIS — I2510 Atherosclerotic heart disease of native coronary artery without angina pectoris: Secondary | ICD-10-CM | POA: Diagnosis present

## 2021-02-11 DIAGNOSIS — I5082 Biventricular heart failure: Secondary | ICD-10-CM | POA: Diagnosis present

## 2021-02-11 DIAGNOSIS — I5021 Acute systolic (congestive) heart failure: Secondary | ICD-10-CM | POA: Diagnosis not present

## 2021-02-11 DIAGNOSIS — R6521 Severe sepsis with septic shock: Secondary | ICD-10-CM | POA: Diagnosis not present

## 2021-02-11 DIAGNOSIS — N182 Chronic kidney disease, stage 2 (mild): Secondary | ICD-10-CM | POA: Diagnosis present

## 2021-02-11 DIAGNOSIS — J9601 Acute respiratory failure with hypoxia: Secondary | ICD-10-CM | POA: Diagnosis not present

## 2021-02-11 DIAGNOSIS — E872 Acidosis, unspecified: Secondary | ICD-10-CM | POA: Diagnosis present

## 2021-02-11 DIAGNOSIS — Z9289 Personal history of other medical treatment: Secondary | ICD-10-CM

## 2021-02-11 DIAGNOSIS — Y92009 Unspecified place in unspecified non-institutional (private) residence as the place of occurrence of the external cause: Secondary | ICD-10-CM

## 2021-02-11 DIAGNOSIS — Z23 Encounter for immunization: Secondary | ICD-10-CM

## 2021-02-11 DIAGNOSIS — N179 Acute kidney failure, unspecified: Secondary | ICD-10-CM | POA: Diagnosis not present

## 2021-02-11 DIAGNOSIS — Z86010 Personal history of colonic polyps: Secondary | ICD-10-CM

## 2021-02-11 DIAGNOSIS — Z978 Presence of other specified devices: Secondary | ICD-10-CM

## 2021-02-11 DIAGNOSIS — R64 Cachexia: Secondary | ICD-10-CM | POA: Diagnosis present

## 2021-02-11 DIAGNOSIS — R001 Bradycardia, unspecified: Secondary | ICD-10-CM | POA: Diagnosis not present

## 2021-02-11 DIAGNOSIS — Z888 Allergy status to other drugs, medicaments and biological substances status: Secondary | ICD-10-CM

## 2021-02-11 DIAGNOSIS — F32A Depression, unspecified: Secondary | ICD-10-CM | POA: Diagnosis present

## 2021-02-11 DIAGNOSIS — I255 Ischemic cardiomyopathy: Secondary | ICD-10-CM | POA: Diagnosis present

## 2021-02-11 DIAGNOSIS — I130 Hypertensive heart and chronic kidney disease with heart failure and stage 1 through stage 4 chronic kidney disease, or unspecified chronic kidney disease: Secondary | ICD-10-CM | POA: Diagnosis present

## 2021-02-11 DIAGNOSIS — F03B11 Unspecified dementia, moderate, with agitation: Secondary | ICD-10-CM | POA: Diagnosis present

## 2021-02-11 DIAGNOSIS — Y92000 Kitchen of unspecified non-institutional (private) residence as  the place of occurrence of the external cause: Secondary | ICD-10-CM

## 2021-02-11 DIAGNOSIS — K760 Fatty (change of) liver, not elsewhere classified: Secondary | ICD-10-CM | POA: Diagnosis present

## 2021-02-11 DIAGNOSIS — I700 Atherosclerosis of aorta: Secondary | ICD-10-CM | POA: Diagnosis present

## 2021-02-11 DIAGNOSIS — Z79899 Other long term (current) drug therapy: Secondary | ICD-10-CM

## 2021-02-11 DIAGNOSIS — E119 Type 2 diabetes mellitus without complications: Secondary | ICD-10-CM

## 2021-02-11 DIAGNOSIS — I480 Paroxysmal atrial fibrillation: Secondary | ICD-10-CM | POA: Diagnosis present

## 2021-02-11 DIAGNOSIS — R000 Tachycardia, unspecified: Secondary | ICD-10-CM | POA: Diagnosis present

## 2021-02-11 LAB — URINALYSIS, MICROSCOPIC (REFLEX)

## 2021-02-11 LAB — CBC WITH DIFFERENTIAL/PLATELET
Abs Immature Granulocytes: 0.15 10*3/uL — ABNORMAL HIGH (ref 0.00–0.07)
Basophils Absolute: 0 10*3/uL (ref 0.0–0.1)
Basophils Relative: 0 %
Eosinophils Absolute: 0.1 10*3/uL (ref 0.0–0.5)
Eosinophils Relative: 1 %
HCT: 35.5 % — ABNORMAL LOW (ref 36.0–46.0)
Hemoglobin: 12 g/dL (ref 12.0–15.0)
Immature Granulocytes: 3 %
Lymphocytes Relative: 8 %
Lymphs Abs: 0.5 10*3/uL — ABNORMAL LOW (ref 0.7–4.0)
MCH: 30.8 pg (ref 26.0–34.0)
MCHC: 33.8 g/dL (ref 30.0–36.0)
MCV: 91 fL (ref 80.0–100.0)
Monocytes Absolute: 0.7 10*3/uL (ref 0.1–1.0)
Monocytes Relative: 11 %
Neutro Abs: 4.7 10*3/uL (ref 1.7–7.7)
Neutrophils Relative %: 77 %
Platelets: UNDETERMINED 10*3/uL (ref 150–400)
RBC: 3.9 MIL/uL (ref 3.87–5.11)
RDW: 12.6 % (ref 11.5–15.5)
WBC: 6.1 10*3/uL (ref 4.0–10.5)
nRBC: 0 % (ref 0.0–0.2)

## 2021-02-11 LAB — COMPREHENSIVE METABOLIC PANEL
ALT: 27 U/L (ref 0–44)
AST: 28 U/L (ref 15–41)
Albumin: 4 g/dL (ref 3.5–5.0)
Alkaline Phosphatase: 31 U/L — ABNORMAL LOW (ref 38–126)
Anion gap: 20 — ABNORMAL HIGH (ref 5–15)
BUN: 14 mg/dL (ref 8–23)
CO2: 15 mmol/L — ABNORMAL LOW (ref 22–32)
Calcium: 8.8 mg/dL — ABNORMAL LOW (ref 8.9–10.3)
Chloride: 94 mmol/L — ABNORMAL LOW (ref 98–111)
Creatinine, Ser: 1.29 mg/dL — ABNORMAL HIGH (ref 0.44–1.00)
GFR, Estimated: 40 mL/min — ABNORMAL LOW (ref >60)
Glucose, Bld: 139 mg/dL — ABNORMAL HIGH (ref 70–99)
Potassium: 4.1 mmol/L (ref 3.5–5.1)
Sodium: 129 mmol/L — ABNORMAL LOW (ref 135–145)
Total Bilirubin: 1.7 mg/dL — ABNORMAL HIGH (ref 0.3–1.2)
Total Protein: 7.1 g/dL (ref 6.5–8.1)

## 2021-02-11 LAB — CK: Total CK: 176 U/L (ref 38–234)

## 2021-02-11 LAB — LIPASE, BLOOD: Lipase: 65 U/L — ABNORMAL HIGH (ref 11–51)

## 2021-02-11 LAB — URINALYSIS, ROUTINE W REFLEX MICROSCOPIC
Glucose, UA: NEGATIVE mg/dL
Ketones, ur: >80 mg/dL — AB
Leukocytes,Ua: NEGATIVE
Nitrite: NEGATIVE
Protein, ur: NEGATIVE mg/dL
Specific Gravity, Urine: 1.02 (ref 1.005–1.030)
pH: 6 (ref 5.0–8.0)

## 2021-02-11 LAB — TROPONIN I (HIGH SENSITIVITY)
Troponin I (High Sensitivity): 6 ng/L (ref <18)
Troponin I (High Sensitivity): 12 ng/L (ref <18)

## 2021-02-11 LAB — CBG MONITORING, ED
Glucose-Capillary: 136 mg/dL — ABNORMAL HIGH (ref 70–99)
Glucose-Capillary: 121 mg/dL — ABNORMAL HIGH (ref 70–99)

## 2021-02-11 LAB — TSH: TSH: 0.437 u[IU]/mL (ref 0.350–4.500)

## 2021-02-11 LAB — RESP PANEL BY RT-PCR (FLU A&B, COVID) ARPGX2
Influenza A by PCR: NEGATIVE
Influenza B by PCR: NEGATIVE
SARS Coronavirus 2 by RT PCR: NEGATIVE

## 2021-02-11 LAB — MAGNESIUM: Magnesium: 1.4 mg/dL — ABNORMAL LOW (ref 1.7–2.4)

## 2021-02-11 LAB — LACTIC ACID, PLASMA: Lactic Acid, Venous: 1.2 mmol/L (ref 0.5–1.9)

## 2021-02-11 LAB — BILIRUBIN, DIRECT: Bilirubin, Direct: 0.3 mg/dL — ABNORMAL HIGH (ref 0.0–0.2)

## 2021-02-11 MED ORDER — ISOSORBIDE MONONITRATE ER 30 MG PO TB24
30.0000 mg | ORAL_TABLET | Freq: Every day | ORAL | Status: DC
  Administered 2021-02-12 – 2021-02-13 (×2): 30 mg via ORAL
  Filled 2021-02-11 (×3): qty 1

## 2021-02-11 MED ORDER — MIRTAZAPINE 15 MG PO TABS
15.0000 mg | ORAL_TABLET | Freq: Every day | ORAL | Status: DC
  Administered 2021-02-11 – 2021-02-13 (×3): 15 mg via ORAL
  Filled 2021-02-11 (×6): qty 1

## 2021-02-11 MED ORDER — MELATONIN 5 MG PO TABS
5.0000 mg | ORAL_TABLET | Freq: Every evening | ORAL | Status: DC | PRN
  Administered 2021-02-11 – 2021-02-12 (×2): 5 mg via ORAL
  Filled 2021-02-11 (×2): qty 1

## 2021-02-11 MED ORDER — NICOTINE 14 MG/24HR TD PT24
14.0000 mg | MEDICATED_PATCH | Freq: Every day | TRANSDERMAL | Status: DC
  Administered 2021-02-12 – 2021-02-13 (×2): 14 mg via TRANSDERMAL
  Filled 2021-02-11 (×3): qty 1

## 2021-02-11 MED ORDER — INSULIN ASPART 100 UNIT/ML IJ SOLN
0.0000 [IU] | Freq: Every day | INTRAMUSCULAR | Status: DC
  Administered 2021-02-13: 2 [IU] via SUBCUTANEOUS

## 2021-02-11 MED ORDER — ONDANSETRON HCL 4 MG PO TABS: 4.0000 mg | ORAL_TABLET | Freq: Four times a day (QID) | ORAL | Status: DC | PRN

## 2021-02-11 MED ORDER — ASPIRIN EC 81 MG PO TBEC
81.0000 mg | DELAYED_RELEASE_TABLET | Freq: Every day | ORAL | Status: DC
Start: 2021-02-12 — End: 2021-02-15
  Administered 2021-02-12 – 2021-02-14 (×3): 81 mg via ORAL
  Filled 2021-02-11 (×3): qty 1

## 2021-02-11 MED ORDER — ATORVASTATIN CALCIUM 80 MG PO TABS
80.0000 mg | ORAL_TABLET | Freq: Every day | ORAL | Status: DC
Start: 2021-02-12 — End: 2021-02-15
  Administered 2021-02-12 – 2021-02-13 (×2): 80 mg via ORAL
  Filled 2021-02-11 (×3): qty 1

## 2021-02-11 MED ORDER — TETANUS-DIPHTH-ACELL PERTUSSIS 5-2.5-18.5 LF-MCG/0.5 IM SUSY
0.5000 mL | PREFILLED_SYRINGE | Freq: Once | INTRAMUSCULAR | Status: AC
  Administered 2021-02-11: 0.5 mL via INTRAMUSCULAR
  Filled 2021-02-11: qty 0.5

## 2021-02-11 MED ORDER — ACETAMINOPHEN 500 MG PO TABS
1000.0000 mg | ORAL_TABLET | Freq: Four times a day (QID) | ORAL | Status: DC
  Administered 2021-02-12 – 2021-02-14 (×8): 1000 mg via ORAL
  Filled 2021-02-11 (×10): qty 2

## 2021-02-11 MED ORDER — MAGNESIUM SULFATE 2 GM/50ML IV SOLN
2.0000 g | Freq: Once | INTRAVENOUS | Status: AC
  Administered 2021-02-11: 2 g via INTRAVENOUS
  Filled 2021-02-11: qty 50

## 2021-02-11 MED ORDER — SODIUM CHLORIDE 0.9 % IV BOLUS
500.0000 mL | Freq: Once | INTRAVENOUS | Status: AC
  Administered 2021-02-11: 500 mL via INTRAVENOUS

## 2021-02-11 MED ORDER — SODIUM CHLORIDE 0.9 % IV SOLN: INTRAVENOUS | Status: AC

## 2021-02-11 MED ORDER — INSULIN ASPART 100 UNIT/ML IJ SOLN
0.0000 [IU] | Freq: Three times a day (TID) | INTRAMUSCULAR | Status: DC
  Administered 2021-02-13: 2 [IU] via SUBCUTANEOUS

## 2021-02-11 MED ORDER — ONDANSETRON HCL 4 MG/2ML IJ SOLN
4.0000 mg | Freq: Four times a day (QID) | INTRAMUSCULAR | Status: DC | PRN
  Administered 2021-02-13 – 2021-02-14 (×2): 4 mg via INTRAVENOUS
  Filled 2021-02-11 (×3): qty 2

## 2021-02-11 MED ORDER — NICOTINE 14 MG/24HR TD PT24
14.0000 mg | MEDICATED_PATCH | Freq: Once | TRANSDERMAL | Status: AC
  Administered 2021-02-11: 14 mg via TRANSDERMAL
  Filled 2021-02-11: qty 1

## 2021-02-11 MED ORDER — ACETAMINOPHEN 325 MG PO TABS
650.0000 mg | ORAL_TABLET | Freq: Once | ORAL | Status: AC
  Administered 2021-02-11: 650 mg via ORAL
  Filled 2021-02-11: qty 2

## 2021-02-11 NOTE — Subjective & Objective (Signed)
CC: fall at home HPI: 88 year old female history of type 2 diabetes, CKD stage II, hyperlipidemia, dementia presents to the ER today after having a least 2 falls at home.  Son states the patient likely had a fall earlier this afternoon.  She was seen last night about 10 PM.  Patient had a fall sometime this afternoon.  She was able to get herself up and onto the couch.  When the family came to the door, patient fell again after getting up off the couch.  Son states that he thinks the patient fell in the kitchen.  There was blood on the floor but the blood was not dried yet.  Patient brought to the ER for evaluation.  Head CT negative for acute intracranial pathology.  Patient's posterior scalp laceration repaired with staples.  Labs showed a sodium 129, bicarb 15, BUN 14, creatinine 1.29  Magnesium was 1.4.  Lipase normal at 65.  White count 6.1, hemoglobin 12, platelets were clumped.  EKG showed sinus tachycardia.  Due to the patient's fall, AKI, hyponatremia, [facility] hospitalist contacted for admission.

## 2021-02-11 NOTE — Assessment & Plan Note (Addendum)
Follow-up with PCP in 7 to 10 days to remove staples. Continue Tylenol for pain. Physical therapy has evaluated the patient, will consult inpatient rehab for possible admission.

## 2021-02-11 NOTE — Assessment & Plan Note (Signed)
Baseline Scr 0.8-1.0

## 2021-02-11 NOTE — Assessment & Plan Note (Addendum)
Likely hypovolemic improving with fluid resuscitation. Basic metabolic panels pending. I have reviewed her previous basic metabolic panels

## 2021-02-11 NOTE — Assessment & Plan Note (Addendum)
stable °

## 2021-02-11 NOTE — ED Notes (Signed)
Son stated pt had an old bottle of Lisinopril at home that the pt possibly could be taken without his knowledge which could have called the falling spell.

## 2021-02-11 NOTE — Assessment & Plan Note (Addendum)
Stable, cont to monitor

## 2021-02-11 NOTE — Assessment & Plan Note (Addendum)
Pt lives alone but has family that lives next door and across the street. Son states gas stove has been turned off and pt only has access to microwave. His intention is for patient to return to her home. She is on no meds, follow up with neurologist

## 2021-02-11 NOTE — ED Notes (Signed)
RN and son cleaned dried up blood out pt head. Pt seems to have a small laceration on the back left part of her crown.

## 2021-02-11 NOTE — Assessment & Plan Note (Signed)
Stable. On lipitor.

## 2021-02-11 NOTE — Assessment & Plan Note (Addendum)
In the setting of ACE inhibitor and antihypertensive medication with decreased oral intake as per son. ACE inhibitor was held she was fluid resuscitated her creatinine is improving. I have reviewed her previous basic metabolic panel and her creatinine is improving. Basic metabolic panel is pending today.

## 2021-02-11 NOTE — ED Triage Notes (Signed)
Pt bib ems from home c/o fall. Pt denies LOC and pain at this time. Son is at the bedside he states pt will not say she is in pain. Ems noted pt has two lacerations on mid and lower back of head. Pt has on a c-collar. Pt denies blood thinners. Pt has onset dementia. En route pt BP 94/50 and she was given 500 cc normal saline.   BP 94/50 after fluids 136 CBG 188 HR 100

## 2021-02-11 NOTE — ED Provider Notes (Signed)
[facility] EMERGENCY DEPARTMENT Provider Note   CSN: [id number] Arrival date & time: 02/27/2021  1627     History  Chief Complaint  Patient presents with   [name] [name] is a 88 y.o. female.  With past medical history of dementia, hypertension, type 2 diabetes, CKD stage II, coronary artery disease s/p CABG x4 who presents to the emergency department after fall.  Level 5 caveat: Dementia  Son accompanies her at bedside who provides the majority of the history.  He states that today patient's great granddaughter came to check on the patient and found her on the ground after a fall.  This was an unwitnessed event.  States that she found her with a laceration to the back of her head.  Son believes this had to open the last 3 to 4 hours because the blood was not dried.  He notes that she has been dizzy over the past week intermittently.  He also states that she had recent blood work and showed that she had a decreased magnesium level.  He states that they had bought magnesium supplement however she had not taken any yet.  He denies any known fevers recent illnesses, nausea or vomiting.  Not anticoagulated.   Fall Pertinent negatives include no chest pain, no abdominal pain and no shortness of breath.      Home Medications Prior to Admission medications   Medication Sig Start Date End Date Taking? Authorizing Provider  Apoaequorin (PREVAGEN PO) Take by mouth.    [provider]  aspirin EC 81 MG tablet Take 1 tablet (81 mg total) by mouth daily. 09/22/18   Jerline Pain, MD  atorvastatin (LIPITOR) 80 MG tablet Take 1 tablet (80 mg total) by mouth daily. for cholesterol 01/10/21   Elgie Collard, PA-C  blood glucose meter kit and supplies KIT Dispense based on patient and insurance preference. Check blood sugar 1 time daily-[id number]. 11/11/18   [name], MD  EPINEPHrine 0.3 MG/0.3ML SOSY Inject into skin for anaphylaxis as needed 10/06/20   Magda Bernheim, NP  isosorbide mononitrate (IMDUR) 30 MG 24 hr tablet Take 1 tablet Daily for Heart 01/10/21   Elgie Collard, PA-C  lisinopril (ZESTRIL) 2.5 MG tablet Take 1 tablet Daily for BP & Diabetic Kidney Protection 01/10/21   Elgie Collard, PA-C  metFORMIN (GLUCOPHAGE-XR) 500 MG 24 hr tablet Take 2 tablets 2 x /day with Meals for Diabetes 11/30/19   Liane Comber, NP  mirtazapine (REMERON) 15 MG tablet Take  1 tablet  at Bedtime  for Sleep & Appetite 02/02/21   [name], MD  Misc Natural Products (NEURIVA PO) Take by mouth daily.    [provider]  nitroGLYCERIN (NITROSTAT) 0.4 MG SL tablet Dissolve 1 tablet under tongue every 3 to 5 minutes if needed for ChestPain 01/10/21 12/03/23  Elgie Collard, PA-C  senna ([facility] SENNA) 8.6 MG tablet TAKE 1 TABLET AT BEDTIME FOR CONSTIPATION 09/01/19   [name], MD      Allergies    Bee venom, Persantine [dipyridamole], and Actos [pioglitazone]    Review of Systems   Review of Systems  Constitutional:  Positive for activity change. Negative for chills and fever.  Respiratory:  Negative for cough and shortness of breath.   Cardiovascular:  Negative for chest pain and palpitations.  Gastrointestinal:  Negative for abdominal pain, nausea and vomiting.  Genitourinary:  Negative for dysuria.  Skin:  Positive for wound.  All other systems reviewed and are negative.  Physical Exam Updated Vital Signs BP (!) 160/79 (BP Location: Right Arm)    Pulse 100    Temp 98.1 F (36.7 C) (Oral)    Resp (!) 24    Ht 5' 1"  (1.549 m)    Wt 46.7 kg    LMP  (LMP Unknown)    SpO2 99%    BMI 19.46 kg/m  Physical Exam Vitals and nursing note reviewed.  Constitutional:      General: She is not in acute distress.    Appearance: Normal appearance. She is not ill-appearing or toxic-appearing.  HENT:     Head: Normocephalic. Contusion and laceration present.      Comments: 3 cm laceration to the right posterior scalp    Nose: Nose normal.      Mouth/Throat:     Mouth: Mucous membranes are moist.     Pharynx: Oropharynx is clear.  Eyes:     General: No scleral icterus.    Extraocular Movements: Extraocular movements intact.     Pupils: Pupils are equal, round, and reactive to light.  Cardiovascular:     Rate and Rhythm: Regular rhythm. Tachycardia present.     Pulses: Normal pulses.          Radial pulses are 2+ on the right side and 2+ on the left side.     Heart sounds: Murmur heard.  Systolic murmur is present.  Pulmonary:     Effort: Pulmonary effort is normal. No respiratory distress.     Breath sounds: Normal breath sounds.  Abdominal:     General: Bowel sounds are normal. There is no distension.     Palpations: Abdomen is soft.     Tenderness: There is no abdominal tenderness.  Musculoskeletal:        General: Normal range of motion.     Cervical back: Normal range of motion and neck supple. No tenderness.     Right lower leg: No edema.     Left lower leg: No edema.  Skin:    General: Skin is warm and dry.     Capillary Refill: Capillary refill takes less than 2 seconds.     Coloration: Skin is not jaundiced.     Findings: Bruising present.     Comments: Bruising to the left hand  Neurological:     General: No focal deficit present.     Mental Status: She is alert. Mental status is at baseline.  Psychiatric:        Mood and Affect: Mood normal.        Behavior: Behavior normal.    ED Results / Procedures / Treatments   Labs (all labs ordered are listed, but only abnormal results are displayed) Labs Reviewed  URINALYSIS, ROUTINE W REFLEX MICROSCOPIC - Abnormal; Notable for the following components:      Result Value   Hgb urine dipstick TRACE (*)    Bilirubin Urine MODERATE (*)    Ketones, ur >80 (*)    All other components within normal limits  CBC WITH DIFFERENTIAL/PLATELET - Abnormal; Notable for the following components:   HCT 35.5 (*)    Lymphs Abs 0.5 (*)    Abs Immature Granulocytes 0.15 (*)     All other components within normal limits  COMPREHENSIVE METABOLIC PANEL - Abnormal; Notable for the following components:   Sodium 129 (*)    Chloride 94 (*)    CO2 15 (*)    Glucose, Bld 139 (*)  Creatinine, Ser 1.29 (*)    Calcium 8.8 (*)    Alkaline Phosphatase 31 (*)    Total Bilirubin 1.7 (*)    GFR, Estimated 40 (*)    Anion gap 20 (*)    All other components within normal limits  LIPASE, BLOOD - Abnormal; Notable for the following components:   Lipase 65 (*)    All other components within normal limits  MAGNESIUM - Abnormal; Notable for the following components:   Magnesium 1.4 (*)    All other components within normal limits  URINALYSIS, MICROSCOPIC (REFLEX) - Abnormal; Notable for the following components:   Bacteria, UA RARE (*)    All other components within normal limits  CBG MONITORING, ED - Abnormal; Notable for the following components:   Glucose-Capillary 136 (*)    All other components within normal limits  RESP PANEL BY RT-PCR (FLU A&B, COVID) ARPGX2  CK  CBC WITH DIFFERENTIAL/PLATELET  LACTIC ACID, PLASMA  LACTIC ACID, PLASMA  BILIRUBIN, DIRECT  TSH  TROPONIN I (HIGH SENSITIVITY)  TROPONIN I (HIGH SENSITIVITY)   EKG None  Radiology DG Chest 2 View  Result Date: 02/21/2021 CLINICAL DATA:  Cough EXAM: CHEST - 2 VIEW COMPARISON:  Chest x-ray dated 12/19/2015 FINDINGS: Heart size and mediastinal contours are within normal limits. Median sternotomy wires appear intact and stable in alignment. Lungs are clear. No pleural effusion or pneumothorax is seen. No acute-appearing osseous abnormality. IMPRESSION: No active cardiopulmonary disease. No evidence of pneumonia or pulmonary edema. Electronically Signed   By: Franki Cabot M.D.   On: 02/26/2021 18:02   CT Head Wo Contrast  Result Date: 03/03/2021 CLINICAL DATA:  Head trauma, moderate to severe. Fall. Lacerations to the back of the head. No blood thinners. EXAM: CT HEAD WITHOUT CONTRAST TECHNIQUE:  Contiguous axial images were obtained from the base of the skull through the vertex without intravenous contrast. RADIATION DOSE REDUCTION: This exam was performed according to the departmental dose-optimization program which includes automated exposure control, adjustment of the mA and/or kV according to patient size and/or use of iterative reconstruction technique. COMPARISON:  Maxillofacial CT 06/25/2018 FINDINGS: Brain: Diffuse cerebral atrophy. Ventricular dilatation consistent with central atrophy. Low-attenuation changes in the deep white matter consistent with small vessel ischemia. No abnormal extra-axial fluid collections. No mass effect or midline shift. Gray-white matter junctions are distinct. Basal cisterns are not effaced. No acute intracranial hemorrhage. Vascular: Extensive intracranial arterial vascular calcifications. Skull: Calvarium appears intact. Subcutaneous scalp hematoma over the left posterior occipital region. Sinuses/Orbits: Paranasal sinuses and mastoid air cells are clear. Other: None. IMPRESSION: No acute intracranial abnormalities. Chronic atrophy and small vessel ischemic changes. Electronically Signed   By: Lucienne Capers M.D.   On: 02/24/2021 17:44   CT Cervical Spine Wo Contrast  Result Date: 02/16/2021 CLINICAL DATA:  Neck trauma due to a fall. EXAM: CT CERVICAL SPINE WITHOUT CONTRAST TECHNIQUE: Multidetector CT imaging of the cervical spine was performed without intravenous contrast. Multiplanar CT image reconstructions were also generated. RADIATION DOSE REDUCTION: This exam was performed according to the departmental dose-optimization program which includes automated exposure control, adjustment of the mA and/or kV according to patient size and/or use of iterative reconstruction technique. COMPARISON:  None. FINDINGS: Alignment: Normal alignment of the cervical vertebrae and posterior elements. C1-2 articulation appears intact. Skull base and vertebrae: Skull base  appears intact. No vertebral compression deformities. No focal bone lesion or bone destruction. Bone cortex appears intact. Soft tissues and spinal canal: No prevertebral soft tissue swelling. No  abnormal paraspinal soft tissue mass or infiltration. Prominent vascular calcifications in the cervical carotid arteries. Disc levels: Diffuse degenerative changes with narrowed interspaces and endplate osteophyte formation throughout. Degenerative changes in the facet joints. Uncovertebral spurring at multiple levels causes encroachment upon the neural foramina. Upper chest: Visualized lung apices are clear. Other: None. IMPRESSION: 1. Normal alignment.  No acute displaced fractures. 2. Diffuse degenerative change throughout the cervical spine. 3. Extensive vascular calcifications. Electronically Signed   By: Lucienne Capers M.D.   On: 02/27/2021 17:46   US Abdomen Limited RUQ (LIVER/GB)  Result Date: 02/27/2021 CLINICAL DATA:  Abdominal pain EXAM: ULTRASOUND ABDOMEN LIMITED RIGHT UPPER QUADRANT COMPARISON:  06/25/2018 FINDINGS: Gallbladder: Small gallstones measuring up to 7 mm. No wall thickening or sonographic Murphy sign. Common bile duct: Diameter: Normal caliber, 5-6 mm. Liver: No focal lesion identified. Within normal limits in parenchymal echogenicity. Portal vein is patent on color Doppler imaging with normal direction of blood flow towards the liver. Other: None. IMPRESSION: Cholelithiasis.  No sonographic evidence of acute cholecystitis. Electronically Signed   By: Rolm Baptise M.D.   On: 02/23/2021 22:15    Procedures .Marland KitchenLaceration Repair  Date/Time: 02/27/2021 10:19 PM Performed by: Mickie Hillier, PA-C Authorized by: Mickie Hillier, PA-C   Consent:    Consent obtained:  Verbal   Consent given by:  Guardian   Risks, benefits, and alternatives were discussed: yes     Risks discussed:  Pain, need for additional repair, poor cosmetic result and poor wound healing   Alternatives discussed:  No  treatment and delayed treatment Universal protocol:    Procedure explained and questions answered to patient or proxy's satisfaction: yes     Relevant documents present and verified: yes     Test results available: yes     Imaging studies available: yes     Required blood products, implants, devices, and special equipment available: yes     Site/side marked: yes     Immediately prior to procedure, a time out was called: yes     Patient identity confirmed:  Verbally with patient Anesthesia:    Anesthesia method:  None Laceration details:    Location:  Scalp   Scalp location:  Occipital   Length (cm):  3   Depth (mm):  3 Pre-procedure details:    Preparation:  Imaging obtained to evaluate for foreign bodies Exploration:    Limited defect created (wound extended): no     Imaging outcome: foreign body not noted     Wound exploration: wound explored through full range of motion     Wound extent: areolar tissue violated     Contaminated: no   Treatment:    Area cleansed with:  Povidone-iodine   Amount of cleaning:  Standard   Debridement:  Minimal   Undermining:  None Skin repair:    Repair method:  Staples   Number of staples:  3 Approximation:    Approximation:  Close Repair type:    Repair type:  Simple Post-procedure details:    Dressing:  Open (no dressing)    Medications Ordered in ED Medications  nicotine (NICODERM CQ - dosed in mg/24 hours) patch 14 mg (14 mg Transdermal Patch Applied 02/08/2021 2019)  acetaminophen (TYLENOL) tablet 650 mg (has no administration in time range)  sodium chloride 0.9 % bolus 500 mL (500 mLs Intravenous New Bag/Given 02/13/2021 2047)  magnesium sulfate IVPB 2 g 50 mL (2 g Intravenous New Bag/Given 02/28/2021 2105)  Tdap (BOOSTRIX) injection 0.5 mL (  0.5 mLs Intramuscular Given 02/09/2021 2105)    ED Course/ Medical Decision Making/ A&P                           Medical Decision Making Amount and/or Complexity of Data Reviewed Labs:  ordered. Radiology: ordered.  Risk OTC drugs. Prescription drug management. Decision regarding hospitalization.  Patient presents to the ED with complaints of fall. This involves an extensive number of treatment options, and is a complaint that carries with it a high risk of complications and morbidity.   Additional history obtained:  Additional history obtained from: son at bedside External records from outside source obtained and reviewed including: previous primary care visits  EKG: EKG: sinus tachycardia  Cardiac Monitoring: The patient was maintained on a cardiac monitor.  I personally viewed and interpreted the cardiac monitored which showed an underlying rhythm of: sinus tachycardia  Lab Results: I personally ordered, reviewed, and interpreted labs. Pertinent results include: CBC without leukocytosis CMP showing metabolic acidosis with anion gap 20, CO2 15.  Sodium 129 (previous 10 days ago 130).  AKI with creatinine of 1.29 T bili elevated 1.7, order direct bilirubin which is pending Lipase 65 Magnesium 1.4, replacing Initial troponin 6, repeat 12, delta +6 CK1 76 Lactic pending COVID and flu negative UA without evidence of UTI  Imaging Studies ordered:  I ordered imaging studies which included x-ray, ultrasound, and CT.  I independently reviewed & interpreted imaging & am in agreement with radiology impression. Imaging shows: CT head and neck without acute abnormalities X-ray of the chest without evidence of pneumonia, pneumothorax Ultrasound right upper quadrant with cholelithiasis, without cholecystitis  Medications  I ordered medication including nicotine, normal saline, magnesium, tetanus, Tylenol for cravings, dehydration, hypomagnesemia, laceration, pain respectively Reevaluation of the patient after medication shows that patient improved  Consultations: I requested consultation with the hospitalist, Kristopher Oppenheim,  and discussed lab and imaging findings as  well as pertinent plan - they recommend: Admission  ED Course: 88 year old female who presents to the emergency department after fall.  She is at baseline mental status on my exam.  She is oriented to self and that she is at the hospital, otherwise does not know what year it is or the month.  She has no complaints on my exam.  After work-up the following is her problem list:  Fall Unwitnessed fall  Laceration to posterior scalp - stapled. Will need staples removed in 7 days.  CT head and c-spine with no acute intracranial abnormalities. No c-spine injury. Removed c-collar Troponin x2 negative, doubt ACS as component of fall.  No other limb injuries  Etiologies for fall likely related to problem list below  AKI Likely pre-renal related to dehydration. Lives alone. Unclear if eating and drinking normally. Given 542m fluid bolus  No evidence of UTI  Doubt uremia as source of fall   Metabolic acidosis AG 20, CO2 15 Obtaining lactic acid (if positive, ? Seizure?) Sodium 129 - appears somewhat chronic  Not in DKA. No known ingestions   Hypomagnesemia Unclear etiology  Reported by son. Replacing in ED  Elevated T bili Appears elevated from previous lab draw No murphy sign on exam  Direct bilirubin ordered  [place]domen RUQ with cholelithiasis, no evidence of cholecystitis. Also not likely contributing to fall.  Patient requires admission for unwitness fall with AKI, metabolic acidosis needed stablization. Discussed this with son who agrees and verbalizes understanding. Will admit to Dr. CBridgett Larsson  After consideration of the diagnostic results and the patients response to treatment, I feel that the patent would benefit from admission. The patient has been appropriately medically screened and/or stabilized in the ED. I have low suspicion for any other emergent medical condition which would require further screening, evaluation or treatment in the ED or require inpatient management.   Final Clinical Impression(s) / ED Diagnoses Final diagnoses:  Pain  Fall, initial encounter  AKI (acute kidney injury) [facility])    Rx / [place] Orders ED Discharge Orders     None         Mickie Hillier, PA-C 02/18/2021 2231    Regan Lemming, MD 02/12/21 [phone]

## 2021-02-11 NOTE — H&P (Signed)
History and Physical    [name] [name] XAJ:[id number] DOB: 1931/12/13 DOA: 03/05/2021  DOS: the patient was seen and examined on 02/27/2021  PCP: [name], MD   Patient coming from: Home  I have personally briefly reviewed patient's old medical records in [place]  CC: fall at home HPI: 88 year old female history of type 2 diabetes, CKD stage II, hyperlipidemia, dementia presents to the ER today after having a least 2 falls at home.  Son states the patient likely had a fall earlier this afternoon.  She was seen last night about 10 PM.  Patient had a fall sometime this afternoon.  She was able to get herself up and onto the couch.  When the family came to the door, patient fell again after getting up off the couch.  Son states that he thinks the patient fell in the kitchen.  There was blood on the floor but the blood was not dried yet.  Patient brought to the ER for evaluation.  Head CT negative for acute intracranial pathology.  Patient's posterior scalp laceration repaired with staples.  Labs showed a sodium 129, bicarb 15, BUN 14, creatinine 1.29  Magnesium was 1.4.  Lipase normal at 65.  White count 6.1, hemoglobin 12, platelets were clumped.  EKG showed sinus tachycardia.  Due to the patient's fall, AKI, hyponatremia, [facility] hospitalist contacted for admission.   ED Course: labs shows hyponatremia, AKI. CT head negative  Review of Systems:  Review of Systems  Unable to perform ROS: Dementia   Past Medical History:  Diagnosis Date   Anemia    Anginal pain ([place])    Cataract    Colon polyp 2009    TUBULAR ADENOMA   Coronary artery disease    Depression    Diabetes ([place])    Diverticulosis of colon (without mention of hemorrhage) 2009   Esophagitis 2009   Gastritis 2009   GERD (gastroesophageal reflux disease) 2009   Hiatal hernia 2009   Hyperlipemia    Hypertension    Thyroid disease    hypothyroid    Past Surgical History:  Procedure Laterality  Date   BREAST BIOPSY Left 1999   neg   CARDIAC CATHETERIZATION N/A 11/15/2015   Procedure: Left Heart Cath and Coronary Angiography;  Surgeon: Troy Sine, MD;  Location: [facility];  Service: Cardiovascular;  Laterality: N/A;   COLONOSCOPY  05/2012   Dr. Sharlett Iles   CORONARY ARTERY BYPASS GRAFT  11/16/2015   Procedure: OFF PUMP CORONARY ARTERY BYPASS GRAFTING (CABG) x FOUR, USING LEFT MAMMARY ARTERY  AND ENDOSCOPICALLY HARVESTED RIGHT GREATER SAPHENOUS VEIN.;  Surgeon: Melrose Nakayama, MD;  Location: [facility];  Service: Open Heart Surgery;;   DILATION AND CURETTAGE OF UTERUS  1984   TEE WITHOUT CARDIOVERSION N/A 11/16/2015   Procedure: TRANSESOPHAGEAL ECHOCARDIOGRAM (TEE);  Surgeon: Melrose Nakayama, MD;  Location: [facility];  Service: Open Heart Surgery;  Laterality: N/A;     reports that she has never smoked. Her smokeless tobacco use includes snuff. She reports that she does not drink alcohol and does not use drugs.  Allergies  Allergen Reactions   Bee Venom Anaphylaxis and Other (See Comments)    Eyes swell, cannot breathe   Persantine [Dipyridamole] Other (See Comments)    "went crazy"   Actos [Pioglitazone] Other (See Comments)    UNSPECIFIED REACTION     Family History  Problem Relation Age of Onset   Stroke Mother    Diabetes Father  Lung cancer Brother    Prostate cancer Brother    Breast cancer Maternal Grandmother    Breast cancer Maternal Aunt    Throat cancer Maternal Uncle    Rectal cancer Neg Hx    Stomach cancer Neg Hx    Colon cancer Neg Hx    Esophageal cancer Neg Hx     Prior to Admission medications   Medication Sig Start Date End Date Taking? Authorizing Provider  Apoaequorin (PREVAGEN PO) Take by mouth.    [provider]  aspirin EC 81 MG tablet Take 1 tablet (81 mg total) by mouth daily. 09/22/18   Jerline Pain, MD  atorvastatin (LIPITOR) 80 MG tablet Take 1 tablet (80 mg total) by mouth daily. for cholesterol 01/10/21   Elgie Collard, PA-C  blood glucose meter kit and supplies KIT Dispense based on patient and insurance preference. Check blood sugar 1 time daily-[id number]. 11/11/18   [name], MD  EPINEPHrine 0.3 MG/0.3ML SOSY Inject into skin for anaphylaxis as needed 10/06/20   Magda Bernheim, NP  isosorbide mononitrate (IMDUR) 30 MG 24 hr tablet Take 1 tablet Daily for Heart 01/10/21   Elgie Collard, PA-C  lisinopril (ZESTRIL) 2.5 MG tablet Take 1 tablet Daily for BP & Diabetic Kidney Protection 01/10/21   Elgie Collard, PA-C  metFORMIN (GLUCOPHAGE-XR) 500 MG 24 hr tablet Take 2 tablets 2 x /day with Meals for Diabetes 11/30/19   Liane Comber, NP  mirtazapine (REMERON) 15 MG tablet Take  1 tablet  at Bedtime  for Sleep & Appetite 02/02/21   [name], MD  Misc Natural Products (NEURIVA PO) Take by mouth daily.    [provider]  nitroGLYCERIN (NITROSTAT) 0.4 MG SL tablet Dissolve 1 tablet under tongue every 3 to 5 minutes if needed for ChestPain 01/10/21 12/03/23  Elgie Collard, PA-C  senna ([facility] SENNA) 8.6 MG tablet TAKE 1 TABLET AT BEDTIME FOR CONSTIPATION 09/01/19   [name], MD    Physical Exam: Vitals:   02/24/2021 1700 02/21/2021 1918 03/05/2021 2000 02/14/2021 2100  BP: (!) 139/121 127/67 100/88 124/71  Pulse: (!) 102 (!) 109 (!) 111 (!) 102  Resp: 19 (!) _0 Temp:      TempSrc:      SpO2: 98% 100% 97% 99%  Weight:      Height:        Physical Exam Vitals and nursing note reviewed.  Constitutional:      General: She is not in acute distress.    Appearance: She is not toxic-appearing or diaphoretic.  HENT:     Head:     Comments: Posterior scalp laceration repaired with staples Cardiovascular:     Rate and Rhythm: Regular rhythm. Tachycardia present.     Pulses: Normal pulses.  Pulmonary:     Effort: Pulmonary effort is normal. No respiratory distress.     Breath sounds: No wheezing or rales.  Abdominal:     General: Bowel sounds are normal. There is no distension.      Tenderness: There is no abdominal tenderness. There is no rebound.  Musculoskeletal:     Right lower leg: No edema.     Left lower leg: No edema.  Skin:    General: Skin is warm and dry.     Capillary Refill: Capillary refill takes less than 2 seconds.  Neurological:     Mental Status: She is alert. She is disoriented.     Labs on Admission:  I have personally reviewed following labs and imaging studies  CBC: Recent Labs  Lab 03/06/2021 1813  WBC 6.1  NEUTROABS 4.7  HGB 12.0  HCT 35.5*  MCV 91.0  PLT PLATELET CLUMPS NOTED ON SMEAR, UNABLE TO ESTIMATE   Basic Metabolic Panel: Recent Labs  Lab 02/25/2021 1915  NA 129*  K 4.1  CL 94*  CO2 15*  GLUCOSE 139*  BUN 14  CREATININE 1.29*  CALCIUM 8.8*  MG 1.4*   GFR: Estimated Creatinine Clearance: 22.7 mL/min (A) (by C-G formula based on SCr of 1.29 mg/dL (H)). Liver Function Tests: Recent Labs  Lab 03/02/2021 1915  AST 28  ALT 27  ALKPHOS 31*  BILITOT 1.7*  PROT 7.1  ALBUMIN 4.0   Recent Labs  Lab 03/04/2021 1915  LIPASE 65*   No results for input(s): AMMONIA in the last 168 hours. Coagulation Profile: No results for input(s): INR, PROTIME in the last 168 hours. Cardiac Enzymes: Recent Labs  Lab 02/24/2021 1915  CKTOTAL 176   BNP (last 3 results) No results for input(s): PROBNP in the last 8760 hours. HbA1C: No results for input(s): HGBA1C in the last 72 hours. CBG: Recent Labs  Lab 02/18/2021 2020  GLUCAP 136*   Lipid Profile: No results for input(s): CHOL, HDL, LDLCALC, TRIG, CHOLHDL, LDLDIRECT in the last 72 hours. Thyroid Function Tests: No results for input(s): TSH, T4TOTAL, FREET4, T3FREE, THYROIDAB in the last 72 hours. Anemia Panel: No results for input(s): VITAMINB12, FOLATE, FERRITIN, TIBC, IRON, RETICCTPCT in the last 72 hours. Urine analysis:    Component Value Date/Time   COLORURINE YELLOW 03/02/2021 2046   APPEARANCEUR CLEAR 02/12/2021 2046   LABSPEC 1.020 02/24/2021 2046   PHURINE  6.0 02/08/2021 2046   GLUCOSEU NEGATIVE 03/01/2021 2046   HGBUR TRACE (A) 02/13/2021 2046   BILIRUBINUR MODERATE (A) 02/07/2021 2046   KETONESUR >80 (A) 03/02/2021 2046   PROTEINUR NEGATIVE 03/01/2021 2046   NITRITE NEGATIVE 02/16/2021 2046   LEUKOCYTESUR NEGATIVE 02/17/2021 2046    Radiological Exams on Admission: I have personally reviewed images DG Chest 2 View  Result Date: 03/03/2021 CLINICAL DATA:  Cough EXAM: CHEST - 2 VIEW COMPARISON:  Chest x-ray dated 12/19/2015 FINDINGS: Heart size and mediastinal contours are within normal limits. Median sternotomy wires appear intact and stable in alignment. Lungs are clear. No pleural effusion or pneumothorax is seen. No acute-appearing osseous abnormality. IMPRESSION: No active cardiopulmonary disease. No evidence of pneumonia or pulmonary edema. Electronically Signed   By: Franki Cabot M.D.   On: 02/20/2021 18:02   CT Head Wo Contrast  Result Date: 02/21/2021 CLINICAL DATA:  Head trauma, moderate to severe. Fall. Lacerations to the back of the head. No blood thinners. EXAM: CT HEAD WITHOUT CONTRAST TECHNIQUE: Contiguous axial images were obtained from the base of the skull through the vertex without intravenous contrast. RADIATION DOSE REDUCTION: This exam was performed according to the departmental dose-optimization program which includes automated exposure control, adjustment of the mA and/or kV according to patient size and/or use of iterative reconstruction technique. COMPARISON:  Maxillofacial CT 06/25/2018 FINDINGS: Brain: Diffuse cerebral atrophy. Ventricular dilatation consistent with central atrophy. Low-attenuation changes in the deep white matter consistent with small vessel ischemia. No abnormal extra-axial fluid collections. No mass effect or midline shift. Gray-white matter junctions are distinct. Basal cisterns are not effaced. No acute intracranial hemorrhage. Vascular: Extensive intracranial arterial vascular calcifications. Skull:  Calvarium appears intact. Subcutaneous scalp hematoma over the left posterior occipital region. Sinuses/Orbits: Paranasal sinuses and mastoid air  cells are clear. Other: None. IMPRESSION: No acute intracranial abnormalities. Chronic atrophy and small vessel ischemic changes. Electronically Signed   By: Lucienne Capers M.D.   On: 02/18/2021 17:44   CT Cervical Spine Wo Contrast  Result Date: 02/09/2021 CLINICAL DATA:  Neck trauma due to a fall. EXAM: CT CERVICAL SPINE WITHOUT CONTRAST TECHNIQUE: Multidetector CT imaging of the cervical spine was performed without intravenous contrast. Multiplanar CT image reconstructions were also generated. RADIATION DOSE REDUCTION: This exam was performed according to the departmental dose-optimization program which includes automated exposure control, adjustment of the mA and/or kV according to patient size and/or use of iterative reconstruction technique. COMPARISON:  None. FINDINGS: Alignment: Normal alignment of the cervical vertebrae and posterior elements. C1-2 articulation appears intact. Skull base and vertebrae: Skull base appears intact. No vertebral compression deformities. No focal bone lesion or bone destruction. Bone cortex appears intact. Soft tissues and spinal canal: No prevertebral soft tissue swelling. No abnormal paraspinal soft tissue mass or infiltration. Prominent vascular calcifications in the cervical carotid arteries. Disc levels: Diffuse degenerative changes with narrowed interspaces and endplate osteophyte formation throughout. Degenerative changes in the facet joints. Uncovertebral spurring at multiple levels causes encroachment upon the neural foramina. Upper chest: Visualized lung apices are clear. Other: None. IMPRESSION: 1. Normal alignment.  No acute displaced fractures. 2. Diffuse degenerative change throughout the cervical spine. 3. Extensive vascular calcifications. Electronically Signed   By: Lucienne Capers M.D.   On: 02/09/2021 17:46     EKG: I have personally reviewed EKG: sinus tachycardia   Assessment/Plan Principal Problem:   Fall at home, initial encounter Active Problems:   AKI (acute kidney injury) ([place])   Hyponatremia   CKD stage 2 due to type 2 diabetes mellitus ([place]) - Baseline Scr 0.8-1.0   Hyperlipidemia associated with type 2 diabetes mellitus ([place])   Essential hypertension   Mild dementia ([place])   Type 2 diabetes mellitus ([place])    Assessment and Plan: * Fall at home, initial encounter Observation med/surg bed. PT consult. Laceration to posterior scalp repaired. Remove staples in 7-10 days in PCP office. Scheduled tylenol 1000 mg qid.  AKI (acute kidney injury) ([place]) Stop ACEI. Hydrate with NS. Repeat labs in AM. Son states pt does not like to drink water. May have to stop ACEI due to poor water intake at home.  Hyponatremia Check TSH. Likely due to dehydration. Continue IVF with NS. Repeat BMP in AM.  Type 2 diabetes mellitus ([place]) Stable.  Mild dementia ([place])- (present on admission) Pt lives alone but has family that lives next door and across the street. Son states gas stove has been turned off and pt only has access to microwave. His intention is for patient to return to her home.  Essential hypertension- (present on admission) Stop ACEI due to AKI. On imdur. Consider adding labetalol  Hyperlipidemia associated with type 2 diabetes mellitus ([place])- (present on admission) Stable. On lipitor.  CKD stage 2 due to type 2 diabetes mellitus ([facility]) - Baseline Scr 0.8-1.0- (present on admission) Baseline Scr 0.8-1.0   DVT prophylaxis: SCDs Code Status: Full Code Family Communication: discussed with son matthew at bedside  Disposition Plan: return home Consults called: none  Admission status: Observation, Med-Surg   Kristopher Oppenheim, DO [facility] 02/20/2021, 9:30 PM

## 2021-02-12 ENCOUNTER — Observation Stay (HOSPITAL_COMMUNITY): Payer: Medicare Other

## 2021-02-12 ENCOUNTER — Encounter (HOSPITAL_COMMUNITY): Payer: Self-pay | Admitting: Internal Medicine

## 2021-02-12 DIAGNOSIS — Z23 Encounter for immunization: Secondary | ICD-10-CM | POA: Diagnosis present

## 2021-02-12 DIAGNOSIS — Y92000 Kitchen of unspecified non-institutional (private) residence as  the place of occurrence of the external cause: Secondary | ICD-10-CM | POA: Diagnosis not present

## 2021-02-12 DIAGNOSIS — F32A Depression, unspecified: Secondary | ICD-10-CM | POA: Diagnosis present

## 2021-02-12 DIAGNOSIS — I130 Hypertensive heart and chronic kidney disease with heart failure and stage 1 through stage 4 chronic kidney disease, or unspecified chronic kidney disease: Secondary | ICD-10-CM | POA: Diagnosis present

## 2021-02-12 DIAGNOSIS — Z681 Body mass index (BMI) 19 or less, adult: Secondary | ICD-10-CM | POA: Diagnosis not present

## 2021-02-12 DIAGNOSIS — E1122 Type 2 diabetes mellitus with diabetic chronic kidney disease: Secondary | ICD-10-CM | POA: Diagnosis present

## 2021-02-12 DIAGNOSIS — N179 Acute kidney failure, unspecified: Secondary | ICD-10-CM | POA: Diagnosis not present

## 2021-02-12 DIAGNOSIS — R52 Pain, unspecified: Secondary | ICD-10-CM | POA: Diagnosis present

## 2021-02-12 DIAGNOSIS — Y92009 Unspecified place in unspecified non-institutional (private) residence as the place of occurrence of the external cause: Secondary | ICD-10-CM | POA: Diagnosis not present

## 2021-02-12 DIAGNOSIS — I700 Atherosclerosis of aorta: Secondary | ICD-10-CM | POA: Diagnosis present

## 2021-02-12 DIAGNOSIS — R4182 Altered mental status, unspecified: Secondary | ICD-10-CM | POA: Diagnosis not present

## 2021-02-12 DIAGNOSIS — K760 Fatty (change of) liver, not elsewhere classified: Secondary | ICD-10-CM | POA: Diagnosis present

## 2021-02-12 DIAGNOSIS — J9602 Acute respiratory failure with hypercapnia: Secondary | ICD-10-CM | POA: Diagnosis not present

## 2021-02-12 DIAGNOSIS — W19XXXA Unspecified fall, initial encounter: Secondary | ICD-10-CM | POA: Diagnosis present

## 2021-02-12 DIAGNOSIS — R6521 Severe sepsis with septic shock: Secondary | ICD-10-CM | POA: Diagnosis not present

## 2021-02-12 DIAGNOSIS — Z20822 Contact with and (suspected) exposure to covid-19: Secondary | ICD-10-CM | POA: Diagnosis present

## 2021-02-12 DIAGNOSIS — Z452 Encounter for adjustment and management of vascular access device: Secondary | ICD-10-CM | POA: Diagnosis not present

## 2021-02-12 DIAGNOSIS — R64 Cachexia: Secondary | ICD-10-CM | POA: Diagnosis present

## 2021-02-12 DIAGNOSIS — R778 Other specified abnormalities of plasma proteins: Secondary | ICD-10-CM | POA: Diagnosis not present

## 2021-02-12 DIAGNOSIS — I214 Non-ST elevation (NSTEMI) myocardial infarction: Secondary | ICD-10-CM | POA: Diagnosis not present

## 2021-02-12 DIAGNOSIS — I25118 Atherosclerotic heart disease of native coronary artery with other forms of angina pectoris: Secondary | ICD-10-CM | POA: Diagnosis not present

## 2021-02-12 DIAGNOSIS — R579 Shock, unspecified: Secondary | ICD-10-CM | POA: Diagnosis not present

## 2021-02-12 DIAGNOSIS — K7200 Acute and subacute hepatic failure without coma: Secondary | ICD-10-CM | POA: Diagnosis not present

## 2021-02-12 DIAGNOSIS — E43 Unspecified severe protein-calorie malnutrition: Secondary | ICD-10-CM | POA: Diagnosis present

## 2021-02-12 DIAGNOSIS — Z978 Presence of other specified devices: Secondary | ICD-10-CM | POA: Diagnosis not present

## 2021-02-12 DIAGNOSIS — E871 Hypo-osmolality and hyponatremia: Secondary | ICD-10-CM | POA: Diagnosis present

## 2021-02-12 DIAGNOSIS — E039 Hypothyroidism, unspecified: Secondary | ICD-10-CM | POA: Diagnosis present

## 2021-02-12 DIAGNOSIS — I10 Essential (primary) hypertension: Secondary | ICD-10-CM | POA: Diagnosis not present

## 2021-02-12 DIAGNOSIS — G9341 Metabolic encephalopathy: Secondary | ICD-10-CM | POA: Diagnosis present

## 2021-02-12 DIAGNOSIS — I480 Paroxysmal atrial fibrillation: Secondary | ICD-10-CM | POA: Diagnosis not present

## 2021-02-12 DIAGNOSIS — R40243 Glasgow coma scale score 3-8, unspecified time: Secondary | ICD-10-CM | POA: Diagnosis not present

## 2021-02-12 DIAGNOSIS — I5021 Acute systolic (congestive) heart failure: Secondary | ICD-10-CM | POA: Diagnosis not present

## 2021-02-12 DIAGNOSIS — A419 Sepsis, unspecified organism: Secondary | ICD-10-CM | POA: Diagnosis not present

## 2021-02-12 DIAGNOSIS — N170 Acute kidney failure with tubular necrosis: Secondary | ICD-10-CM | POA: Diagnosis present

## 2021-02-12 DIAGNOSIS — F03B11 Unspecified dementia, moderate, with agitation: Secondary | ICD-10-CM | POA: Diagnosis present

## 2021-02-12 DIAGNOSIS — D6489 Other specified anemias: Secondary | ICD-10-CM | POA: Diagnosis present

## 2021-02-12 DIAGNOSIS — J9601 Acute respiratory failure with hypoxia: Secondary | ICD-10-CM | POA: Diagnosis not present

## 2021-02-12 DIAGNOSIS — R570 Cardiogenic shock: Secondary | ICD-10-CM | POA: Diagnosis not present

## 2021-02-12 DIAGNOSIS — E872 Acidosis, unspecified: Secondary | ICD-10-CM | POA: Diagnosis present

## 2021-02-12 LAB — COMPREHENSIVE METABOLIC PANEL
ALT: 21 U/L (ref 0–44)
AST: 23 U/L (ref 15–41)
Albumin: 3.4 g/dL — ABNORMAL LOW (ref 3.5–5.0)
Alkaline Phosphatase: 26 U/L — ABNORMAL LOW (ref 38–126)
Anion gap: 16 — ABNORMAL HIGH (ref 5–15)
BUN: 14 mg/dL (ref 8–23)
CO2: 16 mmol/L — ABNORMAL LOW (ref 22–32)
Calcium: 8.2 mg/dL — ABNORMAL LOW (ref 8.9–10.3)
Chloride: 98 mmol/L (ref 98–111)
Creatinine, Ser: 1.16 mg/dL — ABNORMAL HIGH (ref 0.44–1.00)
GFR, Estimated: 46 mL/min — ABNORMAL LOW (ref >60)
Glucose, Bld: 97 mg/dL (ref 70–99)
Potassium: 3.5 mmol/L (ref 3.5–5.1)
Sodium: 130 mmol/L — ABNORMAL LOW (ref 135–145)
Total Bilirubin: 1.2 mg/dL (ref 0.3–1.2)
Total Protein: 6.2 g/dL — ABNORMAL LOW (ref 6.5–8.1)

## 2021-02-12 LAB — CBC WITH DIFFERENTIAL/PLATELET
Abs Immature Granulocytes: 0.22 10*3/uL — ABNORMAL HIGH (ref 0.00–0.07)
Basophils Absolute: 0 10*3/uL (ref 0.0–0.1)
Basophils Relative: 0 %
Eosinophils Absolute: 0 10*3/uL (ref 0.0–0.5)
Eosinophils Relative: 0 %
HCT: 28.5 % — ABNORMAL LOW (ref 36.0–46.0)
Hemoglobin: 9.9 g/dL — ABNORMAL LOW (ref 12.0–15.0)
Immature Granulocytes: 3 %
Lymphocytes Relative: 16 %
Lymphs Abs: 1.3 10*3/uL (ref 0.7–4.0)
MCH: 30.8 pg (ref 26.0–34.0)
MCHC: 34.7 g/dL (ref 30.0–36.0)
MCV: 88.8 fL (ref 80.0–100.0)
Monocytes Absolute: 1.9 10*3/uL — ABNORMAL HIGH (ref 0.1–1.0)
Monocytes Relative: 24 %
Neutro Abs: 4.4 10*3/uL (ref 1.7–7.7)
Neutrophils Relative %: 57 %
Platelets: 127 10*3/uL — ABNORMAL LOW (ref 150–400)
RBC: 3.21 MIL/uL — ABNORMAL LOW (ref 3.87–5.11)
RDW: 12.6 % (ref 11.5–15.5)
WBC: 7.7 10*3/uL (ref 4.0–10.5)
nRBC: 0 % (ref 0.0–0.2)

## 2021-02-12 LAB — GLUCOSE, CAPILLARY
Glucose-Capillary: 118 mg/dL — ABNORMAL HIGH (ref 70–99)
Glucose-Capillary: 85 mg/dL (ref 70–99)
Glucose-Capillary: 79 mg/dL (ref 70–99)

## 2021-02-12 LAB — MAGNESIUM: Magnesium: 2 mg/dL (ref 1.7–2.4)

## 2021-02-12 LAB — CBG MONITORING, ED: Glucose-Capillary: 100 mg/dL — ABNORMAL HIGH (ref 70–99)

## 2021-02-12 MED ORDER — SODIUM CHLORIDE 0.9 % IV BOLUS
500.0000 mL | Freq: Once | INTRAVENOUS | Status: AC
  Administered 2021-02-12: 500 mL via INTRAVENOUS

## 2021-02-12 MED ORDER — IOHEXOL 300 MG/ML  SOLN
80.0000 mL | Freq: Once | INTRAMUSCULAR | Status: AC | PRN
  Administered 2021-02-12: 80 mL via INTRAVENOUS

## 2021-02-12 MED ORDER — SODIUM CHLORIDE 0.9 % IV SOLN: INTRAVENOUS | Status: AC

## 2021-02-12 NOTE — ED Notes (Signed)
Breakfast Orders Placed °

## 2021-02-12 NOTE — ED Notes (Signed)
RN updated son, [name].

## 2021-02-12 NOTE — Progress Notes (Signed)
[facility] PROGRESS NOTE    Progress Note  [name] [name]  EXB:[id number] DOB: 12/10/1931 DOA: 02/19/2021 PCP: [name], MD     Brief Narrative:   [name] [name] is an 88 y.o. female past medical history of diabetes mellitus type 2 chronic kidney disease stage II hyperlipidemia dementia comes into the ER with at least 2 falls at home, CT of the head was negative for intracranial pathology but it did show small laceration, her sodium was 129 creatinine 1.2 mag 1.4 hemoglobin of 12, she was admitted for acute kidney injury hyponatremia     Assessment/Plan:   Fall at home, initial encounter/was a laceration of the posterior scalp: Status postrepair follow-up with PCP in 7 to 10 days to remove staples. On Tylenol for pain.  Acute kidney injury on chronic kidney disease stage II: Baseline creatinine of less than 0.8. In the setting of ACE inhibitor use and probably hypovolemia. The patient son relates decreased oral intake. Is being fluid resuscitated and her creatinine is coming down slowly.  Right lower quadrant abdominal pain: CT scan of the abdomen pelvis with contrast she had rebound guarding on physical exam. External rotation of the right hip is intact. Please n.p.o. Continue IV fluids we will give her a bolus of normal saline monitor strict I's and O's.  Hypovolemic hyponatremia: Fluid resuscitated her sodium is improving nicely.  Diabetes mellitus type 2: Blood glucose stable off insulin continue CBGs only once a day.  Moderate dementia without behavioral disturbances: Patient lives alone but has family who lives next door across the street. Awaiting PT OT evaluation.  Essential hypertension: She was kept on Imdur ACE inhibitor stopped due to acute kidney injury. Blood pressure slowly trending down after fluid resuscitation.    DVT prophylaxis: lovenox Family Communication:son Status is: Observation The patient will require care spanning > 2  midnights and should be moved to inpatient because:           Code Status:     Code Status Orders  (From admission, onward)           Start     Ordered   02/10/2021 2226  Full code  Continuous        02/25/2021 2225           Code Status History     Date Active Date Inactive Code Status Order ID Comments User Context   02/26/2021 2119 02/23/2021 2225 Full Code [id number]  Kristopher Oppenheim, DO ED   06/24/2018 2221 07/01/2018 2025 Full Code [id number]  Vianne Bulls, MD Inpatient   11/16/2015 1431 11/25/2015 1614 Full Code [id number]  Nani Skillern, PA-C Inpatient   11/15/2015 1235 11/16/2015 1430 Full Code [id number]  Troy Sine, MD Inpatient      Advance Directive Documentation    Flowsheet Row Most Recent Value  Type of Advance Directive Healthcare Power of [place], Living will  Pre-existing out of facility DNR order (yellow form or pink MOST form) --  "MOST" Form in Place? --         IV Access:   Peripheral IV   Procedures and diagnostic studies:   DG Chest 2 View  Result Date: 02/13/2021 CLINICAL DATA:  Cough EXAM: CHEST - 2 VIEW COMPARISON:  Chest x-ray dated 12/19/2015 FINDINGS: Heart size and mediastinal contours are within normal limits. Median sternotomy wires appear intact and stable in alignment. Lungs are clear. No pleural effusion or pneumothorax is seen. No acute-appearing osseous abnormality. IMPRESSION: No active cardiopulmonary  disease. No evidence of pneumonia or pulmonary edema. Electronically Signed   By: Franki Cabot M.D.   On: 02/20/2021 18:02   CT Head Wo Contrast  Result Date: 02/22/2021 CLINICAL DATA:  Head trauma, moderate to severe. Fall. Lacerations to the back of the head. No blood thinners. EXAM: CT HEAD WITHOUT CONTRAST TECHNIQUE: Contiguous axial images were obtained from the base of the skull through the vertex without intravenous contrast. RADIATION DOSE REDUCTION: This exam was performed according to the departmental  dose-optimization program which includes automated exposure control, adjustment of the mA and/or kV according to patient size and/or use of iterative reconstruction technique. COMPARISON:  Maxillofacial CT 06/25/2018 FINDINGS: Brain: Diffuse cerebral atrophy. Ventricular dilatation consistent with central atrophy. Low-attenuation changes in the deep white matter consistent with small vessel ischemia. No abnormal extra-axial fluid collections. No mass effect or midline shift. Gray-white matter junctions are distinct. Basal cisterns are not effaced. No acute intracranial hemorrhage. Vascular: Extensive intracranial arterial vascular calcifications. Skull: Calvarium appears intact. Subcutaneous scalp hematoma over the left posterior occipital region. Sinuses/Orbits: Paranasal sinuses and mastoid air cells are clear. Other: None. IMPRESSION: No acute intracranial abnormalities. Chronic atrophy and small vessel ischemic changes. Electronically Signed   By: Lucienne Capers M.D.   On: 02/17/2021 17:44   CT Cervical Spine Wo Contrast  Result Date: 02/10/2021 CLINICAL DATA:  Neck trauma due to a fall. EXAM: CT CERVICAL SPINE WITHOUT CONTRAST TECHNIQUE: Multidetector CT imaging of the cervical spine was performed without intravenous contrast. Multiplanar CT image reconstructions were also generated. RADIATION DOSE REDUCTION: This exam was performed according to the departmental dose-optimization program which includes automated exposure control, adjustment of the mA and/or kV according to patient size and/or use of iterative reconstruction technique. COMPARISON:  None. FINDINGS: Alignment: Normal alignment of the cervical vertebrae and posterior elements. C1-2 articulation appears intact. Skull base and vertebrae: Skull base appears intact. No vertebral compression deformities. No focal bone lesion or bone destruction. Bone cortex appears intact. Soft tissues and spinal canal: No prevertebral soft tissue swelling. No  abnormal paraspinal soft tissue mass or infiltration. Prominent vascular calcifications in the cervical carotid arteries. Disc levels: Diffuse degenerative changes with narrowed interspaces and endplate osteophyte formation throughout. Degenerative changes in the facet joints. Uncovertebral spurring at multiple levels causes encroachment upon the neural foramina. Upper chest: Visualized lung apices are clear. Other: None. IMPRESSION: 1. Normal alignment.  No acute displaced fractures. 2. Diffuse degenerative change throughout the cervical spine. 3. Extensive vascular calcifications. Electronically Signed   By: Lucienne Capers M.D.   On: 02/12/2021 17:46   US Abdomen Limited RUQ (LIVER/GB)  Result Date: 02/14/2021 CLINICAL DATA:  Abdominal pain EXAM: ULTRASOUND ABDOMEN LIMITED RIGHT UPPER QUADRANT COMPARISON:  06/25/2018 FINDINGS: Gallbladder: Small gallstones measuring up to 7 mm. No wall thickening or sonographic Murphy sign. Common bile duct: Diameter: Normal caliber, 5-6 mm. Liver: No focal lesion identified. Within normal limits in parenchymal echogenicity. Portal vein is patent on color Doppler imaging with normal direction of blood flow towards the liver. Other: None. IMPRESSION: Cholelithiasis.  No sonographic evidence of acute cholecystitis. Electronically Signed   By: Rolm Baptise M.D.   On: 02/10/2021 22:15     Medical Consultants:   None.   Subjective:    Ellyn Hack to the nausea right lower quadrant pain.  Objective:    Vitals:   02/12/21 0745 02/12/21 0815 02/12/21 0830 02/12/21 0845  BP: 114/66 103/63 132/87 114/70  Pulse: 88 91 100 93  Resp: (!) 23  20 20 20   Temp:      TempSrc:      SpO2: 95% 97% 97% 98%  Weight:      Height:       SpO2: 98 %  No intake or output data in the 24 hours ending 02/12/21 1012 Filed Weights   02/28/2021 1641  Weight: 46.7 kg    Exam: General exam: In no acute distress. Respiratory system: Good air movement and clear to  auscultation. Cardiovascular system: S1 & S2 heard, RRR. No JVD. Gastrointestinal system: Positive bowel sounds soft exquisite right lower quadrant tenderness with rebound guarding Extremities: No pedal edema. Skin: No rashes, lesions or ulcers Psychiatry: Judgement and insight appear normal. Mood & affect appropriate.    Data Reviewed:    Labs: Basic Metabolic Panel: Recent Labs  Lab 02/10/2021 1915 02/12/21 0207  NA 129* 130*  K 4.1 3.5  CL 94* 98  CO2 15* 16*  GLUCOSE 139* 97  BUN 14 14  CREATININE 1.29* 1.16*  CALCIUM 8.8* 8.2*  MG 1.4* 2.0   GFR Estimated Creatinine Clearance: 25.2 mL/min (A) (by C-G formula based on SCr of 1.16 mg/dL (H)). Liver Function Tests: Recent Labs  Lab 02/23/2021 1915 02/12/21 0207  AST 28 23  ALT 27 21  ALKPHOS 31* 26*  BILITOT 1.7* 1.2  PROT 7.1 6.2*  ALBUMIN 4.0 3.4*   Recent Labs  Lab 02/14/2021 1915  LIPASE 65*   No results for input(s): AMMONIA in the last 168 hours. Coagulation profile No results for input(s): INR, PROTIME in the last 168 hours. COVID-19 Labs  No results for input(s): DDIMER, FERRITIN, LDH, CRP in the last 72 hours.  Lab Results  Component Value Date   SARSCOV2NAA NEGATIVE 02/09/2021   [place] NEGATIVE 06/24/2018    CBC: Recent Labs  Lab 02/21/2021 1813 02/12/21 0207  WBC 6.1 7.7  NEUTROABS 4.7 4.4  HGB 12.0 9.9*  HCT 35.5* 28.5*  MCV 91.0 88.8  PLT PLATELET CLUMPS NOTED ON SMEAR, UNABLE TO ESTIMATE 127*   Cardiac Enzymes: Recent Labs  Lab 02/24/2021 1915  CKTOTAL 176   BNP (last 3 results) No results for input(s): PROBNP in the last 8760 hours. CBG: Recent Labs  Lab 03/01/2021 2020 02/10/2021 03/27/27 02/12/21 0746  GLUCAP 136* 121* 100*   D-Dimer: No results for input(s): DDIMER in the last 72 hours. Hgb A1c: No results for input(s): HGBA1C in the last 72 hours. Lipid Profile: No results for input(s): CHOL, HDL, LDLCALC, TRIG, CHOLHDL, LDLDIRECT in the last 72 hours. Thyroid  function studies: Recent Labs    03/01/2021 03/26/36  TSH 0.437   Anemia work up: No results for input(s): VITAMINB12, FOLATE, FERRITIN, TIBC, IRON, RETICCTPCT in the last 72 hours. Sepsis Labs: Recent Labs  Lab 02/27/2021 1813 02/08/2021 03-26-36 02/12/21 0207  WBC 6.1  --  7.7  LATICACIDVEN  --  1.2  --    Microbiology Recent Results (from the past 240 hour(s))  Resp Panel by RT-PCR (Flu A&B, Covid) Nasopharyngeal Swab     Status: None   Collection Time: 02/08/2021  8:32 PM   Specimen: Nasopharyngeal Swab; Nasopharyngeal(NP) swabs in vial transport medium  Result Value Ref Range Status   SARS Coronavirus 2 by RT PCR NEGATIVE NEGATIVE Final    Comment: (NOTE) SARS-CoV-2 target nucleic acids are NOT DETECTED.  The SARS-CoV-2 RNA is generally detectable in upper respiratory specimens during the acute phase of infection. The lowest concentration of SARS-CoV-2 viral copies this assay can detect is 138 copies/mL.  A negative result does not preclude SARS-Cov-2 infection and should not be used as the sole basis for treatment or other patient management decisions. A negative result may occur with  improper specimen collection/handling, submission of specimen other than nasopharyngeal swab, presence of viral mutation(s) within the areas targeted by this assay, and inadequate number of viral copies(<138 copies/mL). A negative result must be combined with clinical observations, patient history, and epidemiological information. The expected result is Negative.  Fact Sheet for Patients:  EntrepreneurPulse.com.au  Fact Sheet for Healthcare Providers:  IncredibleEmployment.be  This test is no t yet approved or cleared by the [place] FDA and  has been authorized for detection and/or diagnosis of SARS-CoV-2 by FDA under an Emergency Use Authorization (EUA). This EUA will remain  in effect (meaning this test can be used) for the duration of the COVID-19  declaration under Section 564(b)(1) of the Act, 21 U.S.C.section 360bbb-3(b)(1), unless the authorization is terminated  or revoked sooner.       Influenza A by PCR NEGATIVE NEGATIVE Final   Influenza B by PCR NEGATIVE NEGATIVE Final    Comment: (NOTE) The Xpert Xpress SARS-CoV-2/FLU/RSV plus assay is intended as an aid in the diagnosis of influenza from Nasopharyngeal swab specimens and should not be used as a sole basis for treatment. Nasal washings and aspirates are unacceptable for Xpert Xpress SARS-CoV-2/FLU/RSV testing.  Fact Sheet for Patients: EntrepreneurPulse.com.au  Fact Sheet for Healthcare Providers: IncredibleEmployment.be  This test is not yet approved or cleared by the [place] FDA and has been authorized for detection and/or diagnosis of SARS-CoV-2 by FDA under an Emergency Use Authorization (EUA). This EUA will remain in effect (meaning this test can be used) for the duration of the COVID-19 declaration under Section 564(b)(1) of the Act, 21 U.S.C. section 360bbb-3(b)(1), unless the authorization is terminated or revoked.  Performed at [facility], [place] [address], [place], [place] [address]      Medications:    acetaminophen  1,000 mg Oral QID   aspirin EC  81 mg Oral Daily   atorvastatin  80 mg Oral Daily   insulin aspart  0-5 Units Subcutaneous QHS   insulin aspart  0-9 Units Subcutaneous TID WC   isosorbide mononitrate  30 mg Oral Daily   mirtazapine  15 mg Oral QHS   nicotine  14 mg Transdermal Once   nicotine  14 mg Transdermal Daily   Continuous Infusions:    LOS: 0 days   Charlynne Cousins  [facility]  02/12/2021, 10:12 AM

## 2021-02-12 NOTE — ED Notes (Signed)
RN wheel chaired pt to bathroom

## 2021-02-12 NOTE — Evaluation (Signed)
Physical Therapy Evaluation Patient Details Name: [name] [name] MRN: [id number] DOB: [birth year] Today's Date: 02/12/2021  History of Present Illness  88 yo female with onset of a fall was admitted 2/5, now has pain on R hip and glut that prohibit standing on RLE.  Had fallen more than once, had posterior scalp laceration.  Noted AKI, hyponatremia.  PMHx:   dementia, DM, CKD 2, HTN, HLD,  Clinical Impression  Pt was seen for mobility on side of bed with mod assist to attain that posture, and mod to return to bed due to back pain. Pt has had mult falls prior to admission, and despite imaging of her abd area with no fracture findings, is getting pain on back that is hindering standing.  Pt will be seen as she tolerates for standing and gait, and did flag her issues to MD as well as nursing to consider the reasons.  Has a bruise above the area of pt tenderness located in upper R glut area, and the actual bruise is not painful.  Follow along with her to work toward going home but for now requesting CIR due to her high level of gait with no AD at times, family involvement and her interest in maintaining independence.         Recommendations for follow up therapy are one component of a multi-disciplinary discharge planning process, led by the attending physician.  Recommendations may be updated based on patient status, additional functional criteria and insurance authorization.  Follow Up Recommendations Skilled nursing-short term rehab (<3 hours/day)    Assistance Recommended at Discharge Frequent or constant Supervision/Assistance  Patient can return home with the following  A lot of help with walking and/or transfers;A little help with bathing/dressing/bathroom;Help with stairs or ramp for entrance;Assist for transportation    Equipment Recommendations None recommended by PT  Recommendations for Other Services  Rehab consult    Functional Status Assessment Patient has had a recent decline in  their functional status and demonstrates the ability to make significant improvements in function in a reasonable and predictable amount of time.     Precautions / Restrictions Precautions Precautions: Fall Precaution Comments: R back pain Restrictions Weight Bearing Restrictions: No      Mobility  Bed Mobility Overal bed mobility: Needs Assistance Bed Mobility: Rolling, Sidelying to Sit, Sit to Sidelying Rolling: Min assist Sidelying to sit: Mod assist     Sit to sidelying: Mod assist General bed mobility comments: mod assist for movement but pt can assist to sit up with cues for reaching    Transfers Overall transfer level: Needs assistance Equipment used: 1 person hand held assist, Rolling walker (2 wheels) Transfers: Sit to/from Stand Sit to Stand: Min assist           General transfer comment: attempted to stand which she can but R hip is too painful    Ambulation/Gait               General Gait Details: deferred due to pain  Stairs            Wheelchair Mobility    Modified Rankin (Stroke Patients Only)       Balance Overall balance assessment: Needs assistance Sitting-balance support: Feet supported Sitting balance-Leahy Scale: Good     Standing balance support: Bilateral upper extremity supported, During functional activity Standing balance-Leahy Scale: Zero  Pertinent Vitals/Pain Pain Assessment Pain Assessment: Faces Faces Pain Scale: Hurts whole lot Pain Location: R back with standing on RLE Pain Descriptors / Indicators: Tender, Tightness, Guarding, Grimacing Pain Intervention(s): Limited activity within patient's tolerance, Monitored during session, Repositioned, RN gave pain meds during session    [place] expects to be discharged to:: Private residence Living Arrangements: Alone Available Help at Discharge: Family;Personal care attendant;Available  PRN/intermittently Type of Home: House Home Access: Stairs to enter Entrance Stairs-Rails: Right Entrance Stairs-Number of Steps: 3   Home Layout: One level Home Equipment: Cane - single point Additional Comments: pt has been very independent and did not have the equipment previously, walks with no AD at times    Prior Function Prior Level of Function : Needs assist       Physical Assist : Mobility (physical) Mobility (physical): Gait   Mobility Comments: spc for gait ADLs Comments: does not clean and cook but family brings in food to heat and she microwaves, family does housekeeping     Hand Dominance   Dominant Hand: Right    Extremity/Trunk Assessment   Upper Extremity Assessment Upper Extremity Assessment: Generalized weakness    Lower Extremity Assessment Lower Extremity Assessment: Generalized weakness    Cervical / Trunk Assessment Cervical / Trunk Assessment: Kyphotic  Communication   Communication: HOH  Cognition Arousal/Alertness: Awake/alert Behavior During Therapy: WFL for tasks assessed/performed Overall Cognitive Status: Within Functional Limits for tasks assessed                                 General Comments: pt was able to assist with standing but once attempting full WB on walker on RLE she stopped and sat        General Comments General comments (skin integrity, edema, etc.): pt was assisted to get to side of bed and to reposition to stand but then was too painful to try it.  talked with MD and nursing on messaging to report the issue.    Exercises     Assessment/Plan    PT Assessment Patient needs continued PT services  PT Problem List Decreased strength;Decreased activity tolerance;Decreased mobility;Decreased skin integrity;Pain       PT Treatment Interventions DME instruction;Gait training;Stair training;Functional mobility training;Therapeutic activities;Therapeutic exercise;Balance training;Neuromuscular  re-education;Patient/family education    PT Goals (Current goals can be found in the Care Plan section)  Acute Rehab PT Goals Patient Stated Goal: to get her back pain to go away PT Goal Formulation: With patient/family Time For Goal Achievement: 02/26/21 Potential to Achieve Goals: Good    Frequency Min 3X/week     Co-evaluation               AM-PAC PT "6 Clicks" Mobility  Outcome Measure Help needed turning from your back to your side while in a flat bed without using bedrails?: A Lot Help needed moving from lying on your back to sitting on the side of a flat bed without using bedrails?: A Lot Help needed moving to and from a bed to a chair (including a wheelchair)?: A Lot Help needed standing up from a chair using your arms (e.g., wheelchair or bedside chair)?: A Lot Help needed to walk in hospital room?: Total Help needed climbing 3-5 steps with a railing? : Total 6 Click Score: 10    End of Session Equipment Utilized During Treatment: Gait belt Activity Tolerance: Patient limited by pain Patient left: in bed;with call  bell/phone within reach;with bed alarm set;with family/visitor present;with nursing/sitter in room Nurse Communication: Mobility status;Other (comment) (pain on R side of back) PT Visit Diagnosis: Unsteadiness on feet (R26.81);Pain;Difficulty in walking, not elsewhere classified (R26.2);Muscle weakness (generalized) (M62.81);Repeated falls (R29.6) Pain - Right/Left: Right Pain - part of body:  (back)    Time: 6438-3779 PT Time Calculation (min) (ACUTE ONLY): 31 min   Charges:   PT Evaluation $PT Eval Moderate Complexity: 1 Mod PT Treatments $Therapeutic Activity: 8-22 mins       Ramond Dial 02/12/2021, 5:56 PM  Mee Hives, PT PhD Acute Rehab Dept. Number: [place]

## 2021-02-12 NOTE — ED Notes (Signed)
Patient transported to CT 

## 2021-02-12 NOTE — Plan of Care (Signed)
°  Problem: Education: Goal: Knowledge of General Education information will improve Description: Including pain rating scale, medication(s)/side effects and non-pharmacologic comfort measures Outcome: Progressing   Problem: Clinical Measurements: Goal: Diagnostic test results will improve Outcome: Progressing   Problem: Activity: Goal: Risk for activity intolerance will decrease Outcome: Progressing   Problem: Nutrition: Goal: Adequate nutrition will be maintained Outcome: Progressing   

## 2021-02-13 ENCOUNTER — Inpatient Hospital Stay (HOSPITAL_COMMUNITY): Payer: Medicare Other

## 2021-02-13 DIAGNOSIS — R1031 Right lower quadrant pain: Secondary | ICD-10-CM

## 2021-02-13 LAB — BASIC METABOLIC PANEL
Anion gap: 10 (ref 5–15)
BUN: 9 mg/dL (ref 8–23)
CO2: 20 mmol/L — ABNORMAL LOW (ref 22–32)
Calcium: 8 mg/dL — ABNORMAL LOW (ref 8.9–10.3)
Chloride: 104 mmol/L (ref 98–111)
Creatinine, Ser: 0.78 mg/dL (ref 0.44–1.00)
GFR, Estimated: >60 mL/min (ref >60)
Glucose, Bld: 106 mg/dL — ABNORMAL HIGH (ref 70–99)
Potassium: 3.5 mmol/L (ref 3.5–5.1)
Sodium: 134 mmol/L — ABNORMAL LOW (ref 135–145)

## 2021-02-13 LAB — CBC WITH DIFFERENTIAL/PLATELET
Abs Immature Granulocytes: 0.21 10*3/uL — ABNORMAL HIGH (ref 0.00–0.07)
Basophils Absolute: 0 10*3/uL (ref 0.0–0.1)
Basophils Relative: 0 %
Eosinophils Absolute: 0 10*3/uL (ref 0.0–0.5)
Eosinophils Relative: 0 %
HCT: 25.4 % — ABNORMAL LOW (ref 36.0–46.0)
Hemoglobin: 9 g/dL — ABNORMAL LOW (ref 12.0–15.0)
Immature Granulocytes: 3 %
Lymphocytes Relative: 13 %
Lymphs Abs: 0.9 10*3/uL (ref 0.7–4.0)
MCH: 31.1 pg (ref 26.0–34.0)
MCHC: 35.4 g/dL (ref 30.0–36.0)
MCV: 87.9 fL (ref 80.0–100.0)
Monocytes Absolute: 1.4 10*3/uL — ABNORMAL HIGH (ref 0.1–1.0)
Monocytes Relative: 19 %
Neutro Abs: 4.6 10*3/uL (ref 1.7–7.7)
Neutrophils Relative %: 65 %
Platelets: 103 10*3/uL — ABNORMAL LOW (ref 150–400)
RBC: 2.89 MIL/uL — ABNORMAL LOW (ref 3.87–5.11)
RDW: 12.9 % (ref 11.5–15.5)
WBC: 7.1 10*3/uL (ref 4.0–10.5)
nRBC: 0 % (ref 0.0–0.2)

## 2021-02-13 LAB — GLUCOSE, CAPILLARY
Glucose-Capillary: 119 mg/dL — ABNORMAL HIGH (ref 70–99)
Glucose-Capillary: 108 mg/dL — ABNORMAL HIGH (ref 70–99)
Glucose-Capillary: 153 mg/dL — ABNORMAL HIGH (ref 70–99)
Glucose-Capillary: 207 mg/dL — ABNORMAL HIGH (ref 70–99)

## 2021-02-13 MED ORDER — BISACODYL 10 MG RE SUPP
10.0000 mg | Freq: Once | RECTAL | Status: AC
  Administered 2021-02-14: 10 mg via RECTAL
  Filled 2021-02-13 (×2): qty 1

## 2021-02-13 MED ORDER — TRAMADOL HCL 50 MG PO TABS
25.0000 mg | ORAL_TABLET | Freq: Once | ORAL | Status: AC
Start: 2021-02-13 — End: 2021-02-13
  Administered 2021-02-13: 25 mg via ORAL
  Filled 2021-02-13: qty 1

## 2021-02-13 MED ORDER — ENOXAPARIN SODIUM 30 MG/0.3ML IJ SOSY
30.0000 mg | PREFILLED_SYRINGE | INTRAMUSCULAR | Status: DC
  Administered 2021-02-13: 30 mg via SUBCUTANEOUS
  Filled 2021-02-13: qty 0.3

## 2021-02-13 NOTE — Hospital Course (Signed)
[name] [name] is an 88 y.o. female past medical history of diabetes mellitus type 2 chronic kidney disease stage II hyperlipidemia dementia comes into the ER with at least 2 falls at home, CT of the head was negative for intracranial pathology but it did show small laceration, her sodium was 129 creatinine 1.2 mag 1.4 hemoglobin of 12, she was admitted for acute kidney injury hyponatremia

## 2021-02-13 NOTE — Progress Notes (Addendum)
Progress Note   Patient: [name] [name] VQX:[id number] DOB: 21-Sep-1931 DOA: 02/16/2021     1 DOS: the patient was seen and examined on 02/13/2021   Brief hospital course: BLONDELL LAPERLE is an 88 y.o. female past medical history of diabetes mellitus type 2 chronic kidney disease stage II hyperlipidemia dementia comes into the ER with at least 2 falls at home, CT of the head was negative for intracranial pathology but it did show small laceration, her sodium was 129 creatinine 1.2 mag 1.4 hemoglobin of 12, she was admitted for acute kidney injury hyponatremia  Assessment and Plan: * Fall at home, initial encounter Follow-up with PCP in 7 to 10 days to remove staples. Continue Tylenol for pain. Physical therapy has evaluated the patient, will consult inpatient rehab for possible admission.  AKI (acute kidney injury) ([place]) In the setting of ACE inhibitor and antihypertensive medication with decreased oral intake as per son. ACE inhibitor was held she was fluid resuscitated her creatinine is improving. I have reviewed her previous basic metabolic panel and her creatinine is improving. Basic metabolic panel is pending today.   CKD stage 2 due to type 2 diabetes mellitus (HCC) - Baseline Scr 0.8-1.0- (present on admission) Baseline Scr 0.8-1.0  Right lower quadrant abdominal pain CT scan of the abdomen and pelvis did not showed the appendix identified.  Showed no inflammatory findings Get a hip x-ray hip fracture external rotation of the hip is negative.  Hyponatremia Likely hypovolemic improving with fluid resuscitation. Basic metabolic panels pending. I have reviewed her previous basic metabolic panels  Type 2 diabetes mellitus ([place]) stable  Hyperlipidemia associated with type 2 diabetes mellitus ([place])- (present on admission) Stable. On lipitor.  Essential hypertension- (present on admission) Stable, cont to monitor  Moderate dementia without behavioral disturbance- (present on  admission) Pt lives alone but has family that lives next door and across the street. Son states gas stove has been turned off and pt only has access to microwave. His intention is for patient to return to her home. She is on no meds, follow up with neurologist        Subjective: sleeping this am, son relates she cont to have right hip pain  Physical Exam: Vitals:   02/12/21 2116 02/13/21 0659 02/13/21 0915 02/13/21 1100  BP: 111/68 (!) 106/58 (!) 109/56   Pulse: 80 86 74 76  Resp: $Remo'17 16 18 14  'XYymg$ Temp: 98.4 F (36.9 C) (!) 97.5 F (36.4 C) 98 F (36.7 C)   TempSrc:  Oral Axillary   SpO2: 97% 97% 96%   Weight:      Height:        General exam: In no acute distress. Respiratory system: Good air movement and clear to auscultation. Cardiovascular system: S1 & S2 heard, RRR. No JVD. Gastrointestinal system: Positive bowel sounds soft , mild tenderness in RLQ, no repoubding Extremities: No pedal edema. Skin: No rashes, lesions or ulcers Psychiatry: Judgement and insight appear normal. Mood & affect appropriate Data Reviewed:  Results are pending, will review when available. I have Reviewed nursing notes, Vitals, and Lab results since pt's last encounter. Pertinent lab results basic met pan pending I have ordered test including right hip x-ray  Family Communication: son  Disposition: Status is: Inpatient Remains inpatient appropriate because: acute right hip pain          Planned Discharge Destination: Rehab     Time spent: 35 minutes  Author: Charlynne Cousins, MD 02/13/2021 1:30 PM  For on call review [email or web address].

## 2021-02-13 NOTE — TOC Progression Note (Addendum)
Transition of Care [facility]) - Progression Note    Patient Details  Name: [name] [name] MRN: [id number] Date of Birth: 07/22/1931  Transition of Care [facility]) CM/SW Contact  Coralee [place], [place] Phone Number: 02/13/2021, 12:44 PM  Clinical Narrative:    CSW met with pt and son, [name] [name], at bedside to discuss SNF recommendation. Pt is lethargic and disoriented at this time. Son states he is agreeable to SNF, and that pt had been previously to a SNF, but son states it was a long time ago, and he doesn't remember where. Pt currently resides at home alone, with in home caregivers, and family across the street. Plan is to go home after SNF.  Son notes a preference for [facility], does not want [facility]. Is agreeable to a fax out. Pt has not been vaccinated for covid. Son stated MD mentioned going to CIR for rehab. CSW notes PT says rehab and SNF. He agrees with moving forward with the SNF workup as well as agreeable to possible CIR. TOC will continue to follow for DC needs.   Expected Discharge Plan: Skilled Nursing Facility Barriers to Discharge: Ship broker, Continued Medical Work up, SNF Pending bed offer  Expected Discharge Plan and Services Expected Discharge Plan: [place] Choice: [place] arrangements for the past 2 months: Single Family Home                                       Social Determinants of Health (SDOH) Interventions    Readmission Risk Interventions No flowsheet data found.

## 2021-02-13 NOTE — NC FL2 (Signed)
[place] LEVEL OF CARE SCREENING TOOL     IDENTIFICATION  Patient Name: [name] [name] Birthdate: 15-May-1931 Sex: female Admission Date (Current Location): 02/10/2021  [facility] and [place] Number:  Herbalist and Address:  [facility], [place] [address], [place], [place] [address]      Provider Number: [id number]  Attending Physician Name and Address:  Charlynne Cousins, MD  Relative Name and Phone Number:  Kaysen Sefcik, [phone]    Current Level of Care: Hospital Recommended Level of Care: [place] Prior Approval Number:    Date Approved/Denied:   PASRR Number: [id number]  Discharge Plan: SNF    Current Diagnoses: Patient Active Problem List   Diagnosis Date Noted   Right lower quadrant abdominal pain 02/13/2021   Fall at home, initial encounter 03/01/2021   AKI (acute kidney injury) ([place]) 03/04/2021   Hyponatremia 02/19/2021   Carotid artery disease ([place]) 01/09/2021   Aortic Insufficiency + Aortic Stenosis  01/09/2021   Major depressive disorder, single episode in full remission ([place]) 10/04/2020   Aortic atherosclerosis ([place]) 02/04/2019   Chronic anemia    History of ischemic cardiomyopathy 06/24/2018   Type 2 diabetes mellitus ([place]) 04/07/2017   Mild dementia ([place]) 07/26/2016   S/P CABG x 4    CAD (coronary artery disease) 11/16/2015   Medication management 07/27/2013   Vitamin D deficiency 02/01/2013   GERD (gastroesophageal reflux disease)    Hypothyroidism    CKD stage 2 due to type 2 diabetes mellitus ([place]) - Baseline Scr 0.8-1.0 06/04/2007   Hyperlipidemia associated with type 2 diabetes mellitus ([place]) 06/04/2007   BMI 24.0-24.9, adult 06/04/2007   Essential hypertension 06/04/2007    Orientation RESPIRATION BLADDER Height & Weight     Self, Place  Normal Continent, External catheter Weight: 102 lb 1.2 oz (46.3 kg) Height:  5\' 1"  (154.9 cm)  BEHAVIORAL SYMPTOMS/MOOD NEUROLOGICAL  BOWEL NUTRITION STATUS      Continent Diet (See DC summary)  AMBULATORY STATUS COMMUNICATION OF NEEDS Skin   Extensive Assist Verbally Skin abrasions (Head Lac w/ staples, Bilateral Ecchymosis arm, head and hip)                       Personal Care Assistance Level of Assistance  Bathing, Feeding, Dressing Bathing Assistance: Maximum assistance Feeding assistance: Limited assistance Dressing Assistance: Maximum assistance     Functional Limitations Info  Sight, Hearing, Speech Sight Info: Adequate Hearing Info: Adequate Speech Info: Adequate    SPECIAL CARE FACTORS FREQUENCY  PT (By licensed PT), OT (By licensed OT)     PT Frequency: 5x week OT Frequency: 5x week            Contractures Contractures Info: Not present    Additional Factors Info  Code Status, Allergies, Insulin Sliding Scale Code Status Info: Full Allergies Info: Bee Venom   Persantine (Dipyridamole)   Actos (Pioglitazone)   Insulin Sliding Scale Info: Insulin Aspart (Novolog) 0-5 U @ bed, 0-9 U 3x daily w/ meals       Current Medications (02/13/2021):  This is the current hospital active medication list Current Facility-Administered Medications  Medication Dose Route Frequency Provider Last Rate Last Admin   acetaminophen (TYLENOL) tablet 1,000 mg  1,000 mg Oral QID Kristopher Oppenheim, DO   1,000 mg at 02/13/21 1045   aspirin EC tablet 81 mg  81 mg Oral Daily Kristopher Oppenheim, DO   81 mg at 02/13/21  1045   atorvastatin (LIPITOR) tablet 80 mg  80 mg Oral Daily Kristopher Oppenheim, DO   80 mg at 02/13/21 1045   bisacodyl (DULCOLAX) suppository 10 mg  10 mg Rectal Once Rise Patience, MD       enoxaparin (LOVENOX) injection 30 mg  30 mg Subcutaneous Q24H Charlynne Cousins, MD       insulin aspart (novoLOG) injection 0-5 Units  0-5 Units Subcutaneous QHS Kristopher Oppenheim, DO       insulin aspart (novoLOG) injection 0-9 Units  0-9 Units Subcutaneous TID WC Kristopher Oppenheim, DO       isosorbide mononitrate (IMDUR) 24 hr tablet 30  mg  30 mg Oral Daily Kristopher Oppenheim, DO   30 mg at 02/13/21 1045   melatonin tablet 5 mg  5 mg Oral QHS PRN Kristopher Oppenheim, DO   5 mg at 02/12/21 2123   mirtazapine (REMERON) tablet 15 mg  15 mg Oral QHS Kristopher Oppenheim, DO   15 mg at 02/12/21 2123   nicotine (NICODERM CQ - dosed in mg/24 hours) patch 14 mg  14 mg Transdermal Daily Kristopher Oppenheim, DO   14 mg at 02/13/21 1046   ondansetron (ZOFRAN) tablet 4 mg  4 mg Oral Q6H PRN Kristopher Oppenheim, DO       Or   ondansetron [facility]) injection 4 mg  4 mg Intravenous Q6H PRN Kristopher Oppenheim, DO         Discharge Medications: Please see discharge summary for a list of discharge medications.  Relevant Imaging Results:  Relevant Lab Results:   Additional Information SS# [place], [place]

## 2021-02-13 NOTE — Assessment & Plan Note (Signed)
CT scan of the abdomen and pelvis did not showed the appendix identified.  Showed no inflammatory findings Get a hip x-ray hip fracture external rotation of the hip is negative.

## 2021-02-13 NOTE — Progress Notes (Signed)
Inpatient Rehab Admissions Coordinator:   CIR consult received. Unfortunately, Pt.'s payor is unlikely to approve CIR stay for her diagnoses. TOC is already working on SNF placement and family is in agreement. CIR will sign off.   Clemens Catholic, [place], [place] Admissions Coordinator  [phone] ([place]) [phone] (office)

## 2021-02-14 ENCOUNTER — Other Ambulatory Visit: Payer: Self-pay | Admitting: Internal Medicine

## 2021-02-14 ENCOUNTER — Inpatient Hospital Stay (HOSPITAL_COMMUNITY): Payer: Medicare Other

## 2021-02-14 DIAGNOSIS — R778 Other specified abnormalities of plasma proteins: Secondary | ICD-10-CM

## 2021-02-14 DIAGNOSIS — E43 Unspecified severe protein-calorie malnutrition: Secondary | ICD-10-CM

## 2021-02-14 DIAGNOSIS — R410 Disorientation, unspecified: Secondary | ICD-10-CM

## 2021-02-14 DIAGNOSIS — R579 Shock, unspecified: Secondary | ICD-10-CM

## 2021-02-14 DIAGNOSIS — A419 Sepsis, unspecified organism: Secondary | ICD-10-CM

## 2021-02-14 DIAGNOSIS — R570 Cardiogenic shock: Secondary | ICD-10-CM

## 2021-02-14 DIAGNOSIS — I25118 Atherosclerotic heart disease of native coronary artery with other forms of angina pectoris: Secondary | ICD-10-CM

## 2021-02-14 DIAGNOSIS — R6521 Severe sepsis with septic shock: Secondary | ICD-10-CM

## 2021-02-14 DIAGNOSIS — J9602 Acute respiratory failure with hypercapnia: Secondary | ICD-10-CM

## 2021-02-14 DIAGNOSIS — I480 Paroxysmal atrial fibrillation: Secondary | ICD-10-CM

## 2021-02-14 DIAGNOSIS — Z978 Presence of other specified devices: Secondary | ICD-10-CM

## 2021-02-14 DIAGNOSIS — Z452 Encounter for adjustment and management of vascular access device: Secondary | ICD-10-CM

## 2021-02-14 LAB — POCT I-STAT 7, (LYTES, BLD GAS, ICA,H+H)
Acid-base deficit: 24 mmol/L — ABNORMAL HIGH (ref 0.0–2.0)
Bicarbonate: 5.1 mmol/L — ABNORMAL LOW (ref 20.0–28.0)
Calcium, Ion: 0.91 mmol/L — ABNORMAL LOW (ref 1.15–1.40)
HCT: 21 % — ABNORMAL LOW (ref 36.0–46.0)
Hemoglobin: 7.1 g/dL — ABNORMAL LOW (ref 12.0–15.0)
O2 Saturation: 100 %
Patient temperature: 34.2
Potassium: 5.2 mmol/L — ABNORMAL HIGH (ref 3.5–5.1)
Sodium: 132 mmol/L — ABNORMAL LOW (ref 135–145)
TCO2: 6 mmol/L — ABNORMAL LOW (ref 22–32)
pCO2 arterial: 18.2 mmHg — CL (ref 32.0–48.0)
pH, Arterial: 7.033 — CL (ref 7.350–7.450)
pO2, Arterial: 442 mmHg — ABNORMAL HIGH (ref 83.0–108.0)

## 2021-02-14 LAB — BASIC METABOLIC PANEL
Anion gap: 17 — ABNORMAL HIGH (ref 5–15)
Anion gap: 23 — ABNORMAL HIGH (ref 5–15)
BUN: 11 mg/dL (ref 8–23)
BUN: 16 mg/dL (ref 8–23)
CO2: 17 mmol/L — ABNORMAL LOW (ref 22–32)
CO2: 9 mmol/L — ABNORMAL LOW (ref 22–32)
Calcium: 8.2 mg/dL — ABNORMAL LOW (ref 8.9–10.3)
Calcium: 7.4 mg/dL — ABNORMAL LOW (ref 8.9–10.3)
Chloride: 98 mmol/L (ref 98–111)
Chloride: 102 mmol/L (ref 98–111)
Creatinine, Ser: 1.12 mg/dL — ABNORMAL HIGH (ref 0.44–1.00)
Creatinine, Ser: 1.82 mg/dL — ABNORMAL HIGH (ref 0.44–1.00)
GFR, Estimated: 48 mL/min — ABNORMAL LOW (ref >60)
GFR, Estimated: 27 mL/min — ABNORMAL LOW (ref >60)
Glucose, Bld: 106 mg/dL — ABNORMAL HIGH (ref 70–99)
Glucose, Bld: 101 mg/dL — ABNORMAL HIGH (ref 70–99)
Potassium: 3.8 mmol/L (ref 3.5–5.1)
Potassium: 4.1 mmol/L (ref 3.5–5.1)
Sodium: 132 mmol/L — ABNORMAL LOW (ref 135–145)
Sodium: 134 mmol/L — ABNORMAL LOW (ref 135–145)

## 2021-02-14 LAB — CBC WITH DIFFERENTIAL/PLATELET
Abs Immature Granulocytes: 0.79 10*3/uL — ABNORMAL HIGH (ref 0.00–0.07)
Basophils Absolute: 0 10*3/uL (ref 0.0–0.1)
Basophils Relative: 0 %
Eosinophils Absolute: 0.1 10*3/uL (ref 0.0–0.5)
Eosinophils Relative: 1 %
HCT: 28.6 % — ABNORMAL LOW (ref 36.0–46.0)
Hemoglobin: 10 g/dL — ABNORMAL LOW (ref 12.0–15.0)
Immature Granulocytes: 6 %
Lymphocytes Relative: 2 %
Lymphs Abs: 0.2 10*3/uL — ABNORMAL LOW (ref 0.7–4.0)
MCH: 30.7 pg (ref 26.0–34.0)
MCHC: 35 g/dL (ref 30.0–36.0)
MCV: 87.7 fL (ref 80.0–100.0)
Monocytes Absolute: 1.4 10*3/uL — ABNORMAL HIGH (ref 0.1–1.0)
Monocytes Relative: 10 %
Neutro Abs: 11.7 10*3/uL — ABNORMAL HIGH (ref 1.7–7.7)
Neutrophils Relative %: 81 %
Platelets: 119 10*3/uL — ABNORMAL LOW (ref 150–400)
RBC: 3.26 MIL/uL — ABNORMAL LOW (ref 3.87–5.11)
RDW: 12.7 % (ref 11.5–15.5)
WBC: 14.2 10*3/uL — ABNORMAL HIGH (ref 4.0–10.5)
nRBC: 0 % (ref 0.0–0.2)

## 2021-02-14 LAB — CBC
HCT: 27.2 % — ABNORMAL LOW (ref 36.0–46.0)
Hemoglobin: 9.1 g/dL — ABNORMAL LOW (ref 12.0–15.0)
MCH: 31.2 pg (ref 26.0–34.0)
MCHC: 33.5 g/dL (ref 30.0–36.0)
MCV: 93.2 fL (ref 80.0–100.0)
Platelets: 111 10*3/uL — ABNORMAL LOW (ref 150–400)
RBC: 2.92 MIL/uL — ABNORMAL LOW (ref 3.87–5.11)
RDW: 13.2 % (ref 11.5–15.5)
WBC: 14 10*3/uL — ABNORMAL HIGH (ref 4.0–10.5)
nRBC: 0 % (ref 0.0–0.2)

## 2021-02-14 LAB — PROCALCITONIN: Procalcitonin: 21.28 ng/mL

## 2021-02-14 LAB — ECHOCARDIOGRAM COMPLETE
AR max vel: 0.92 cm2
AV Area VTI: 0.77 cm2
AV Area mean vel: 0.87 cm2
AV Mean grad: 10 mmHg
AV Peak grad: 17 mmHg
Ao pk vel: 2.06 m/s
Area-P 1/2: 4.01 cm2
Calc EF: 39.1 %
Height: 61 in
P 1/2 time: 309 msec
S' Lateral: 2.6 cm
Single Plane A2C EF: 41 %
Single Plane A4C EF: 38.2 %
Weight: 1753.1 oz

## 2021-02-14 LAB — TROPONIN I (HIGH SENSITIVITY)
Troponin I (High Sensitivity): 1273 ng/L (ref <18)
Troponin I (High Sensitivity): 6419 ng/L (ref <18)

## 2021-02-14 LAB — GLUCOSE, CAPILLARY
Glucose-Capillary: 88 mg/dL (ref 70–99)
Glucose-Capillary: 89 mg/dL (ref 70–99)
Glucose-Capillary: 102 mg/dL — ABNORMAL HIGH (ref 70–99)
Glucose-Capillary: 113 mg/dL — ABNORMAL HIGH (ref 70–99)
Glucose-Capillary: 128 mg/dL — ABNORMAL HIGH (ref 70–99)

## 2021-02-14 LAB — MAGNESIUM: Magnesium: 1.5 mg/dL — ABNORMAL LOW (ref 1.7–2.4)

## 2021-02-14 LAB — LACTIC ACID, PLASMA
Lactic Acid, Venous: >9 mmol/L (ref 0.5–1.9)
Lactic Acid, Venous: >9 mmol/L (ref 0.5–1.9)

## 2021-02-14 LAB — BRAIN NATRIURETIC PEPTIDE: B Natriuretic Peptide: 248.6 pg/mL — ABNORMAL HIGH (ref 0.0–100.0)

## 2021-02-14 LAB — MRSA NEXT GEN BY PCR, NASAL: MRSA by PCR Next Gen: NOT DETECTED

## 2021-02-14 MED ORDER — PHENYLEPHRINE 40 MCG/ML (10ML) SYRINGE FOR IV PUSH (FOR BLOOD PRESSURE SUPPORT)
PREFILLED_SYRINGE | INTRAVENOUS | Status: AC
  Filled 2021-02-14: qty 10

## 2021-02-14 MED ORDER — PANTOPRAZOLE SODIUM 40 MG IV SOLR
40.0000 mg | Freq: Every day | INTRAVENOUS | Status: DC
Start: 2021-02-14 — End: 2021-02-14

## 2021-02-14 MED ORDER — PANTOPRAZOLE SODIUM 40 MG IV SOLR
40.0000 mg | Freq: Every day | INTRAVENOUS | Status: DC
  Filled 2021-02-14: qty 10

## 2021-02-14 MED ORDER — SODIUM CHLORIDE 0.9 % IV BOLUS
1000.0000 mL | Freq: Once | INTRAVENOUS | Status: AC
  Administered 2021-02-14: 1000 mL via INTRAVENOUS

## 2021-02-14 MED ORDER — MIRTAZAPINE 15 MG PO TABS
15.0000 mg | ORAL_TABLET | Freq: Every day | ORAL | Status: DC
  Administered 2021-02-14: 15 mg
  Filled 2021-02-14: qty 1

## 2021-02-14 MED ORDER — SODIUM BICARBONATE 8.4 % IV SOLN
INTRAVENOUS | Status: AC
  Administered 2021-02-14: 100 meq via INTRAVENOUS
  Filled 2021-02-14: qty 100

## 2021-02-14 MED ORDER — SODIUM BICARBONATE 8.4 % IV SOLN
INTRAVENOUS | Status: DC
  Filled 2021-02-14: qty 1000

## 2021-02-14 MED ORDER — ATROPINE SULFATE 1 MG/10ML IJ SOSY
PREFILLED_SYRINGE | INTRAMUSCULAR | Status: AC
  Administered 2021-02-14: 0.5 mg via INTRAVENOUS
  Filled 2021-02-14: qty 10

## 2021-02-14 MED ORDER — HEPARIN BOLUS VIA INFUSION
2000.0000 [IU] | Freq: Once | INTRAVENOUS | Status: DC
  Filled 2021-02-14: qty 2000

## 2021-02-14 MED ORDER — DEXMEDETOMIDINE HCL IN NACL 400 MCG/100ML IV SOLN
0.4000 ug/kg/h | INTRAVENOUS | Status: DC
  Administered 2021-02-14: 0.4 ug/kg/h via INTRAVENOUS
  Administered 2021-02-14: 1.2 ug/kg/h via INTRAVENOUS
  Filled 2021-02-14: qty 100

## 2021-02-14 MED ORDER — ETOMIDATE 2 MG/ML IV SOLN: 20.0000 mg | Freq: Once | INTRAVENOUS | Status: AC

## 2021-02-14 MED ORDER — SODIUM CHLORIDE 0.9 % IV SOLN
2.0000 g | INTRAVENOUS | Status: DC
  Administered 2021-02-14: 2 g via INTRAVENOUS
  Filled 2021-02-14: qty 2

## 2021-02-14 MED ORDER — VASOPRESSIN 20 UNITS/100 ML INFUSION FOR SHOCK
0.0000 [IU]/min | INTRAVENOUS | Status: DC
  Administered 2021-02-14: 0.03 [IU]/min via INTRAVENOUS
  Filled 2021-02-14: qty 100

## 2021-02-14 MED ORDER — LACTATED RINGERS IV BOLUS: 1000.0000 mL | Freq: Once | INTRAVENOUS | Status: DC

## 2021-02-14 MED ORDER — SODIUM CHLORIDE 0.9 % IV SOLN: INTRAVENOUS | Status: DC

## 2021-02-14 MED ORDER — DOCUSATE SODIUM 50 MG/5ML PO LIQD
100.0000 mg | Freq: Two times a day (BID) | ORAL | Status: DC
  Administered 2021-02-14: 100 mg
  Filled 2021-02-14: qty 10

## 2021-02-14 MED ORDER — SODIUM BICARBONATE 8.4 % IV SOLN
100.0000 meq | Freq: Once | INTRAVENOUS | Status: AC
  Administered 2021-02-14: 50 meq via INTRAVENOUS

## 2021-02-14 MED ORDER — ALBUMIN HUMAN 5 % IV SOLN
INTRAVENOUS | Status: AC
  Filled 2021-02-14: qty 250

## 2021-02-14 MED ORDER — KETAMINE HCL 50 MG/5ML IJ SOSY
PREFILLED_SYRINGE | INTRAMUSCULAR | Status: AC
  Filled 2021-02-14: qty 5

## 2021-02-14 MED ORDER — CHLORHEXIDINE GLUCONATE CLOTH 2 % EX PADS
6.0000 | MEDICATED_PAD | Freq: Every day | CUTANEOUS | Status: DC
  Administered 2021-02-14: 6 via TOPICAL

## 2021-02-14 MED ORDER — NOREPINEPHRINE 4 MG/250ML-% IV SOLN
2.0000 ug/min | INTRAVENOUS | Status: DC
  Administered 2021-02-14: 5 ug/min via INTRAVENOUS
  Administered 2021-02-14: 80 ug/min via INTRAVENOUS
  Filled 2021-02-14 (×6): qty 250

## 2021-02-14 MED ORDER — HEPARIN (PORCINE) 25000 UT/250ML-% IV SOLN
800.0000 [IU]/h | INTRAVENOUS | Status: DC
  Administered 2021-02-14: 800 [IU]/h via INTRAVENOUS
  Filled 2021-02-14: qty 250

## 2021-02-14 MED ORDER — CALCIUM GLUCONATE-NACL 2-0.675 GM/100ML-% IV SOLN
2.0000 g | Freq: Once | INTRAVENOUS | Status: AC
  Administered 2021-02-15: 2000 mg via INTRAVENOUS
  Filled 2021-02-14: qty 100

## 2021-02-14 MED ORDER — POLYETHYLENE GLYCOL 3350 17 G PO PACK
17.0000 g | PACK | Freq: Every day | ORAL | Status: DC
  Administered 2021-02-14: 17 g
  Filled 2021-02-14: qty 1

## 2021-02-14 MED ORDER — AMIODARONE LOAD VIA INFUSION
150.0000 mg | Freq: Once | INTRAVENOUS | Status: AC
  Administered 2021-02-14: 150 mg via INTRAVENOUS
  Filled 2021-02-14: qty 83.34

## 2021-02-14 MED ORDER — VANCOMYCIN HCL 750 MG/150ML IV SOLN
750.0000 mg | INTRAVENOUS | Status: DC
  Administered 2021-02-14: 750 mg via INTRAVENOUS
  Filled 2021-02-14: qty 150

## 2021-02-14 MED ORDER — AMIODARONE HCL IN DEXTROSE 360-4.14 MG/200ML-% IV SOLN
30.0000 mg/h | INTRAVENOUS | Status: DC
  Filled 2021-02-14: qty 200

## 2021-02-14 MED ORDER — MIDAZOLAM HCL 2 MG/2ML IJ SOLN
INTRAMUSCULAR | Status: AC
  Filled 2021-02-14: qty 2

## 2021-02-14 MED ORDER — ORAL CARE MOUTH RINSE
15.0000 mL | OROMUCOSAL | Status: DC
  Administered 2021-02-14: 15 mL via OROMUCOSAL

## 2021-02-14 MED ORDER — ORAL CARE MOUTH RINSE: 15.0000 mL | Freq: Two times a day (BID) | OROMUCOSAL | Status: DC

## 2021-02-14 MED ORDER — FENTANYL CITRATE (PF) 100 MCG/2ML IJ SOLN: 25.0000 ug | INTRAMUSCULAR | Status: DC | PRN

## 2021-02-14 MED ORDER — CHLORHEXIDINE GLUCONATE 0.12% ORAL RINSE (MEDLINE KIT)
15.0000 mL | Freq: Two times a day (BID) | OROMUCOSAL | Status: DC
  Administered 2021-02-14: 15 mL via OROMUCOSAL

## 2021-02-14 MED ORDER — PERFLUTREN LIPID MICROSPHERE
1.0000 mL | INTRAVENOUS | Status: AC | PRN
  Administered 2021-02-14: 2 mL via INTRAVENOUS
  Filled 2021-02-14: qty 10

## 2021-02-14 MED ORDER — ONDANSETRON HCL 4 MG/2ML IJ SOLN
4.0000 mg | Freq: Once | INTRAMUSCULAR | Status: AC
  Administered 2021-02-14: 4 mg via INTRAVENOUS

## 2021-02-14 MED ORDER — AMIODARONE HCL IN DEXTROSE 360-4.14 MG/200ML-% IV SOLN
INTRAVENOUS | Status: AC
  Filled 2021-02-14: qty 200

## 2021-02-14 MED ORDER — MAGNESIUM SULFATE 4 GM/100ML IV SOLN
4.0000 g | Freq: Once | INTRAVENOUS | Status: AC
  Administered 2021-02-14: 4 g via INTRAVENOUS
  Filled 2021-02-14: qty 100

## 2021-02-14 MED ORDER — SUCCINYLCHOLINE CHLORIDE 200 MG/10ML IV SOSY
PREFILLED_SYRINGE | INTRAVENOUS | Status: AC
  Filled 2021-02-14: qty 10

## 2021-02-14 MED ORDER — AMIODARONE HCL IN DEXTROSE 360-4.14 MG/200ML-% IV SOLN
60.0000 mg/h | INTRAVENOUS | Status: AC
  Administered 2021-02-14: 60 mg/h via INTRAVENOUS

## 2021-02-14 MED ORDER — ROCURONIUM BROMIDE 10 MG/ML (PF) SYRINGE
PREFILLED_SYRINGE | INTRAVENOUS | Status: AC
  Filled 2021-02-14: qty 10

## 2021-02-14 MED ORDER — CHLORHEXIDINE GLUCONATE 0.12% ORAL RINSE (MEDLINE KIT): 15.0000 mL | Freq: Two times a day (BID) | OROMUCOSAL | Status: DC

## 2021-02-14 MED ORDER — FENTANYL CITRATE (PF) 100 MCG/2ML IJ SOLN: 50.0000 ug | Freq: Once | INTRAMUSCULAR | Status: DC

## 2021-02-14 MED ORDER — ORAL CARE MOUTH RINSE
15.0000 mL | Freq: Two times a day (BID) | OROMUCOSAL | Status: DC
  Administered 2021-02-14: 15 mL via OROMUCOSAL

## 2021-02-14 MED ORDER — ATROPINE SULFATE 1 MG/10ML IJ SOSY: 0.5000 mg | PREFILLED_SYRINGE | Freq: Once | INTRAMUSCULAR | Status: AC

## 2021-02-14 MED ORDER — VANCOMYCIN VARIABLE DOSE PER UNSTABLE RENAL FUNCTION (PHARMACIST DOSING): Status: DC

## 2021-02-14 MED ORDER — SODIUM CHLORIDE 0.9 % IV BOLUS
500.0000 mL | Freq: Once | INTRAVENOUS | Status: AC
  Administered 2021-02-14: 500 mL via INTRAVENOUS

## 2021-02-14 MED ORDER — FUROSEMIDE 10 MG/ML IJ SOLN
40.0000 mg | Freq: Once | INTRAMUSCULAR | Status: AC
  Administered 2021-02-14: 40 mg via INTRAVENOUS
  Filled 2021-02-14: qty 4

## 2021-02-14 MED ORDER — SODIUM CHLORIDE 0.9 % IV SOLN
250.0000 mL | INTRAVENOUS | Status: DC
  Administered 2021-02-14: 250 mL via INTRAVENOUS

## 2021-02-14 MED ORDER — HEPARIN SODIUM (PORCINE) 5000 UNIT/ML IJ SOLN
5000.0000 [IU] | Freq: Three times a day (TID) | INTRAMUSCULAR | Status: DC
  Administered 2021-02-14: 5000 [IU] via SUBCUTANEOUS
  Filled 2021-02-14: qty 1

## 2021-02-14 MED ORDER — DOPAMINE-DEXTROSE 3.2-5 MG/ML-% IV SOLN
INTRAVENOUS | Status: AC
  Filled 2021-02-14: qty 250

## 2021-02-14 MED ORDER — FENTANYL CITRATE (PF) 100 MCG/2ML IJ SOLN
INTRAMUSCULAR | Status: AC
  Filled 2021-02-14: qty 2

## 2021-02-14 MED ORDER — SODIUM CHLORIDE 0.9 % IV BOLUS
1000.0000 mL | Freq: Once | INTRAVENOUS | Status: DC
Start: 2021-02-14 — End: 2021-02-14

## 2021-02-14 MED ORDER — DOPAMINE-DEXTROSE 3.2-5 MG/ML-% IV SOLN: 0.0000 ug/kg/min | INTRAVENOUS | Status: DC

## 2021-02-14 MED ORDER — PROSOURCE PLUS PO LIQD
30.0000 mL | Freq: Two times a day (BID) | ORAL | Status: DC
  Filled 2021-02-14 (×3): qty 30

## 2021-02-14 MED ORDER — CHLORHEXIDINE GLUCONATE 0.12 % MT SOLN
15.0000 mL | Freq: Two times a day (BID) | OROMUCOSAL | Status: DC
  Administered 2021-02-14: 15 mL via OROMUCOSAL
  Filled 2021-02-14: qty 15

## 2021-02-14 MED ORDER — FENTANYL CITRATE (PF) 100 MCG/2ML IJ SOLN
INTRAMUSCULAR | Status: AC
  Administered 2021-02-14: 100 ug
  Filled 2021-02-14: qty 2

## 2021-02-14 MED ORDER — ADULT MULTIVITAMIN W/MINERALS CH: 1.0000 | ORAL_TABLET | Freq: Every day | ORAL | Status: DC

## 2021-02-14 MED ORDER — SODIUM BICARBONATE 8.4 % IV SOLN
INTRAVENOUS | Status: AC
  Filled 2021-02-14: qty 50

## 2021-02-14 MED ORDER — SODIUM BICARBONATE 8.4 % IV SOLN
100.0000 meq | Freq: Once | INTRAVENOUS | Status: AC
Start: 2021-02-15 — End: 2021-02-14

## 2021-02-14 MED ORDER — ETOMIDATE 2 MG/ML IV SOLN
INTRAVENOUS | Status: AC
  Administered 2021-02-14: 20 mg via INTRAVENOUS
  Filled 2021-02-14: qty 20

## 2021-02-14 NOTE — Progress Notes (Addendum)
Interval Progress Note:   Patient evaluated at bedside in 31M. Telemetry with HR between 130-150; rhythm consistent with atrial fibrillation. Blood pressure low at 72/58 prior to Levo initiation. Improved to 94/67 with Levophed at 5 mcg/hr.    Bedside echo with evidence of LV dysfunction and volume overload concerning for cardiogenic shock 2/2 HF.   - Amiodarone bolus with infusion - Lasix 40 mg IV once  - STAT TTE. Will contact Cardiology pending results  - Troponin and BNP ordered - Evaluate Mg and K with goal of K > 4 and Mg > 2 - Continue Levophed to maintain MAP > 65.   Addendum: Family updated at bedside. Patient's son [name] states Mrs. Zalar became suddenly SOB and fatigued when walking short distances approximately 1 month ago. Prior to that, she could walk extended distances without SOB or fatigue.  History concerning for cardiovascular event.    Signed,  Dr. Jose Persia Internal Medicine PGY-3   02/14/2021, 3:02 PM

## 2021-02-14 NOTE — Discharge Instructions (Signed)

## 2021-02-14 NOTE — Progress Notes (Signed)
[place] Progress Note Patient Name: [name] [name] DOB: 1931-12-27 MRN: [id number]   Date of Service  02/14/2021  HPI/Events of Note  Patient unresponsive, hypotensive and bradycardic, she had been on Precedex and Amiodarone which is paused.  eICU Interventions  Atropine 0.5 mg iv x 2, Norepinephrine gtt titrated up, Normal Saline fluid bolus, stat ABG, patient was bagged with improvement in he degree of obtundation, suggesting hypercapnia as a contributing factor to her obtundation, PCCM Ground Crew arrived to intubate the patient.        Kerry Kass Sebastain Fishbaugh 02/14/2021, 8:11 PM

## 2021-02-14 NOTE — Progress Notes (Signed)
BP improving 90/57 with NSS bolus still infusing.

## 2021-02-14 NOTE — Consult Note (Addendum)
Cardiology Consultation:   Patient ID: [name] [name] MRN: [id number]; DOB: 12-Sep-1931  Admit date: 02/18/2021 Date of Consult: 02/14/2021  PCP:  [name], MD   [facility] Providers Cardiologist:  Candee Furbish, MD        Patient Profile:   [name] [name] is a 88 y.o. female with a hx of CAD s/p CABG 2017, ischemic cardiomyopathy with improved EF, moderate AI, carotid artery disease, hypertension, hyperlipidemia, DM2, hypothyroidism and a history of dementia who is being seen 02/14/2021 for the evaluation of hypotension at the request of Dr. Tacy Learn.  History of Present Illness:   [name] [name] is a 88 year old female with past medical history of CAD s/p CABG 2017, ischemic cardiomyopathy with improved EF, moderate AI, carotid artery disease, hypertension, hyperlipidemia, DM2, hypothyroidism and a history of dementia.  Initial intraoperative TEE in 2017 showed EF 25%, ejection fraction improved to 60 to 65% by June 2020 on echocardiogram.  Carotid Doppler in June 2020 demonstrated right ICA 40 to 59% disease, left ICA 1 to 39% disease.  Her verapamil was discontinued due to low blood pressure.  She was most recently seen by Nicholes Rough PA-C on 01/10/2021 for annual follow-up.  She was doing well at the time.  Blood pressure was normal at 114/66.  Heart rate was mildly elevated at 116 bpm, no EKG was obtained.  However based on internal medicine follow-up a few days later, her heart rate did completely normalized.  Blood pressure remained stable on 02/01/2021.  Patient was admitted to [facility] on 02/11/2018 2:23 falls at home.  She initially fell however able to get herself up and onto the couch.  When family arrived at the door, patient fell again after getting up from the couch.  On initial arrival in the emergency room, sodium 129, magnesium 1.4, creatinine 1.29, white blood cell count 6.1.  Hemoglobin normal.  EKG showed mild sinus tachycardia with heart rate of 101.  CT of the head  was negative for intracranial pathology.  CT of cervical spine negative for acute fracture but does reveal extensive vascular calcification.  CT abdomen and pelvis showed hepatic steatosis, aortic atherosclerosis, no sign of inflammation in the right lower quadrant.  Hospital course was complicated by episodes of nausea and vomiting with brown emesis.  In the morning of 02/14/2021, patient had hypotension with systolic blood pressure down to 80s.  She was treated with 2 L IV fluid however remained hypotensive.  Family wished to proceed with full code.  Due to persistently low blood pressure, she was treated with levothyroxine.  Patient was also noted to be tachycardic and was in atrial fibrillation with RVR on telemetry at 3 midwest.  However, prior to that, the last telemetry was from yesterday morning on 5 midwest.  There was no telemetry recording at the time of hypotension earlier today.  Echocardiogram obtained on 02/14/2021 demonstrated EF of 40 to 45%, mild LVH, moderately reduced RVEF.  Due to mild drop in ejection fraction, there was some concern for cardiogenic shock.  Cardiology service consulted for hypotension.  Note, since diagnosis of A-fib with RVR with heart rate in the 140s, patient has been treated with IV amiodarone.  She has converted to sinus rhythm since 4:30 PM this afternoon on 02/14/2021.  Patient is currently receiving cefepime, Precedex, Levophed and vancomycin to help cover both cardiac and septic shock.  Patient is confused and unable to answer any questions.  All of the history is obtained from the chart.  Past Medical History:  Diagnosis Date   Anemia    Anginal pain ([place])    Cataract    Colon polyp 2009    TUBULAR ADENOMA   Coronary artery disease    Depression    Diabetes ([place])    Diverticulosis of colon (without mention of hemorrhage) 2009   Esophagitis 2009   Gastritis 2009   GERD (gastroesophageal reflux disease) 2009   Hiatal hernia 2009   Hyperlipemia     Hypertension    Thyroid disease    hypothyroid    Past Surgical History:  Procedure Laterality Date   BREAST BIOPSY Left 1999   neg   CARDIAC CATHETERIZATION N/A 11/15/2015   Procedure: Left Heart Cath and Coronary Angiography;  Surgeon: Troy Sine, MD;  Location: [place] [facility];  Service: Cardiovascular;  Laterality: N/A;   COLONOSCOPY  05/2012   Dr. Sharlett Iles   CORONARY ARTERY BYPASS GRAFT  11/16/2015   Procedure: OFF PUMP CORONARY ARTERY BYPASS GRAFTING (CABG) x FOUR, USING LEFT MAMMARY ARTERY  AND ENDOSCOPICALLY HARVESTED RIGHT GREATER SAPHENOUS VEIN.;  Surgeon: Melrose Nakayama, MD;  Location: [place];  Service: Open Heart Surgery;;   DILATION AND CURETTAGE OF UTERUS  1984   TEE WITHOUT CARDIOVERSION N/A 11/16/2015   Procedure: TRANSESOPHAGEAL ECHOCARDIOGRAM (TEE);  Surgeon: Melrose Nakayama, MD;  Location: [place];  Service: Open Heart Surgery;  Laterality: N/A;     Home Medications:  Prior to Admission medications   Medication Sig Start Date End Date Taking? Authorizing Provider  Apoaequorin (PREVAGEN PO) Take 1 tablet by mouth See admin instructions. For 30 days alternating with neurvia   Yes [provider]  aspirin EC 81 MG tablet Take 1 tablet (81 mg total) by mouth daily. 09/22/18  Yes Jerline Pain, MD  atorvastatin (LIPITOR) 80 MG tablet Take 1 tablet (80 mg total) by mouth daily. for cholesterol 01/10/21  Yes Conte, Tessa N, PA-C  blood glucose meter kit and supplies KIT Dispense based on patient and insurance preference. Check blood sugar 1 time daily-DX-E11.22. 11/11/18  Yes [name], MD  EPINEPHrine 0.3 MG/0.3ML SOSY Inject into skin for anaphylaxis as needed Patient taking differently: Inject 0.3 mg into the muscle as needed (anaphylaxis). 10/06/20  Yes Magda Bernheim, NP  isosorbide mononitrate (IMDUR) 30 MG 24 hr tablet Take 1 tablet Daily for Heart Patient taking differently: Take 30 mg by mouth daily. 01/10/21  Yes Conte, Tessa N, PA-C   lisinopril (ZESTRIL) 2.5 MG tablet Take 1 tablet Daily for BP & Diabetic Kidney Protection Patient taking differently: Take 2.5 mg by mouth daily. 01/10/21  Yes Conte, Tessa N, PA-C  metFORMIN (GLUCOPHAGE-XR) 500 MG 24 hr tablet Take 2 tablets 2 x /day with Meals for Diabetes Patient taking differently: Take 500 mg by mouth in the morning and at bedtime. 11/30/19  Yes Liane Comber, NP  Misc Natural Products (NEURIVA PO) Take 1 tablet by mouth See admin instructions. Every 30 days altering with prevagen   Yes [provider]  nitroGLYCERIN (NITROSTAT) 0.4 MG SL tablet Dissolve 1 tablet under tongue every 3 to 5 minutes if needed for ChestPain Patient taking differently: Place 0.4 mg under the tongue every 5 (five) minutes as needed for chest pain. 01/10/21 12/03/23 Yes Conte, Tessa N, PA-C  senna ([employer] SENNA) 8.6 MG tablet TAKE 1 TABLET AT BEDTIME FOR CONSTIPATION Patient taking differently: Take 1 tablet by mouth daily as needed for constipation. 09/01/19  Yes [name], MD  mirtazapine (REMERON)  15 MG tablet Take  1 tablet  at Bedtime  for Sleep & Appetite Patient not taking: Reported on 02/19/2021 02/02/21   [name], MD    Inpatient Medications: Scheduled Meds:  (feeding supplement) PROSource Plus  30 mL Oral BID BM   acetaminophen  1,000 mg Oral QID   aspirin EC  81 mg Oral Daily   atorvastatin  80 mg Oral Daily   chlorhexidine  15 mL Mouth Rinse BID   Chlorhexidine Gluconate Cloth  6 each Topical Daily   heparin  2,000 Units Intravenous Once   insulin aspart  0-5 Units Subcutaneous QHS   insulin aspart  0-9 Units Subcutaneous TID WC   mouth rinse  15 mL Mouth Rinse BID   mouth rinse  15 mL Mouth Rinse q12n4p   mirtazapine  15 mg Oral QHS   multivitamin with minerals  1 tablet Oral Daily   nicotine  14 mg Transdermal Daily   pantoprazole (PROTONIX) IV  40 mg Intravenous QHS   Continuous Infusions:  sodium chloride 250 mL (02/14/21 1758)   amiodarone 60 mg/hr  (02/14/21 1749)   Followed by   amiodarone     ceFEPime (MAXIPIME) IV 2 g (02/14/21 1801)   dexmedetomidine (PRECEDEX) IV infusion 1.2 mcg/kg/hr (02/14/21 1733)   heparin     norepinephrine (LEVOPHED) Adult infusion 8 mcg/min (02/14/21 1620)   vancomycin 750 mg (02/14/21 1627)   PRN Meds: melatonin, ondansetron **OR** ondansetron (ZOFRAN) IV  Allergies:    Allergies  Allergen Reactions   Bee Venom Anaphylaxis and Other (See Comments)    Eyes swell, cannot breathe   Persantine [Dipyridamole] Other (See Comments)    "went crazy"   Actos [Pioglitazone] Other (See Comments)    UNSPECIFIED REACTION     Social History:   Social History   Socioeconomic History   Marital status: Married    Spouse name: Not on file   Number of children: 3   Years of education: Not on file   Highest education level: Not on file  Occupational History   Not on file  Tobacco Use   Smoking status: Never   Smokeless tobacco: Current    Types: Snuff  Vaping Use   Vaping Use: Never used  Substance and Sexual Activity   Alcohol use: No   Drug use: No   Sexual activity: Not Currently  Other Topics Concern   Not on file  Social History Narrative   Not on file   Social Determinants of Health   Financial Resource Strain: Not on file  Food Insecurity: Not on file  Transportation Needs: Not on file  Physical Activity: Not on file  Stress: Not on file  Social Connections: Not on file  Intimate Partner Violence: Not on file    Family History:    Family History  Problem Relation Age of Onset   Stroke Mother    Diabetes Father    Lung cancer Brother    Prostate cancer Brother    Breast cancer Maternal Grandmother    Breast cancer Maternal Aunt    Throat cancer Maternal Uncle    Rectal cancer Neg Hx    Stomach cancer Neg Hx    Colon cancer Neg Hx    Esophageal cancer Neg Hx      ROS:  Please see the history of present illness.   All other ROS reviewed and negative.     Physical  Exam/Data:   Vitals:   02/14/21 1730 02/14/21 1733 02/14/21 1800 02/14/21  1830  BP:   (!) 91/57 91/68  Pulse: 92 97 (!) 259 80  Resp: (!) 26 (!) 33 (!) 23 (!) 23  Temp:      TempSrc:      SpO2: 99% 93% 90% 90%  Weight:      Height:        Intake/Output Summary (Last 24 hours) at 02/14/2021 1841 Last data filed at 02/14/2021 1418 Gross per 24 hour  Intake 2293.03 ml  Output 100 ml  Net 2193.03 ml   Last 3 Weights 02/14/2021 02/12/2021 02/17/2021  Weight (lbs) 109 lb 9.1 oz 102 lb 1.2 oz 103 lb  Weight (kg) 49.7 kg 46.3 kg 46.72 kg     Body mass index is 20.7 kg/m.  General:  Well nourished, well developed, in no acute distress, confused, moving in the bed, not following instructions HEENT: normal Neck: no JVD Vascular: No carotid bruits; Distal pulses 2+ bilaterally Cardiac:  normal S1, S2; RRR;  Lungs:  clear to auscultation bilaterally, no wheezing, rhonchi or rales  Abd: soft, nontender, no hepatomegaly  Ext: no edema Musculoskeletal:  No deformities, BUE and BLE strength normal and equal Skin: warm and dry ; PAD on buttocks to prevent decubitus ulcer Neuro:  CNs 2-12 intact, no focal abnormalities noted Psych:  Normal affect   EKG:  The EKG was personally reviewed and demonstrates: 02/22/2021, normal sinus rhythm, no significant ST-T wave changes Telemetry:  Telemetry was personally reviewed and demonstrates: Normal sinus rhythm yesterday, however last telemetry recording was around 10 AM.  Off of telemetry until today when the patient was noted to be in atrial fibrillation with RVR with heart rate in the 140s.  Converted to sinus rhythm since 4:30 PM today.  Relevant CV Studies:  Echo 02/14/2021  1. Left ventricular ejection fraction, by estimation, is 40 to 45%. The  left ventricle has mildly decreased function. The left ventricle  demonstrates global hypokinesis. There is mild left ventricular  hypertrophy. Left ventricular diastolic parameters  are indeterminate.   2.  Right ventricular systolic function is moderately reduced. The right  ventricular size is not well visualized. There is normal pulmonary artery  systolic pressure.   3. No evidence of mitral valve regurgitation.   4. The aortic valve was not well visualized. Aortic valve regurgitation  is trivial.   5. The inferior vena cava is normal in size with <50% respiratory  variability, suggesting right atrial pressure of 8 mmHg.   Conclusion(s)/Recommendation(s): EF more reduced compared to prior echo  06/26/2018.   Laboratory Data:  High Sensitivity Troponin:   Recent Labs  Lab 02/24/2021 1701 02/21/2021 1901 02/14/21 1618  TROPONINIHS 6 12 1,273*     Chemistry Recent Labs  Lab 02/10/2021 1915 02/12/21 0207 02/13/21 1110 02/14/21 0730 02/14/21 1618  NA 129* 130* 134* 132* PENDING  K 4.1 3.5 3.5 3.8 4.1  CL 94* 98 104 98 PENDING  CO2 15* 16* 20* 17* PENDING  GLUCOSE 139* 97 106* 106* 101*  BUN 14 14 9 11 16   CREATININE 1.29* 1.16* 0.78 1.12* 1.82*  CALCIUM 8.8* 8.2* 8.0* 8.2* PENDING  MG 1.4* 2.0  --   --  1.5*  GFRNONAA 40* 46* >60 48* 27*  ANIONGAP 20* 16* 10 17* PENDING    Recent Labs  Lab 02/17/2021 1915 02/12/21 0207  PROT 7.1 6.2*  ALBUMIN 4.0 3.4*  AST 28 23  ALT 27 21  ALKPHOS 31* 26*  BILITOT 1.7* 1.2   Lipids No results for input(s):  CHOL, TRIG, HDL, LABVLDL, LDLCALC, CHOLHDL in the last 168 hours.  Hematology Recent Labs  Lab 02/12/21 0207 02/13/21 0402 02/14/21 0730  WBC 7.7 7.1 14.2*  RBC 3.21* 2.89* 3.26*  HGB 9.9* 9.0* 10.0*  HCT 28.5* 25.4* 28.6*  MCV 88.8 87.9 87.7  MCH 30.8 31.1 30.7  MCHC 34.7 35.4 35.0  RDW 12.6 12.9 12.7  PLT 127* 103* 119*   Thyroid  Recent Labs  Lab 02/10/2021 2138  TSH 0.437    BNPNo results for input(s): BNP, PROBNP in the last 168 hours.  DDimer No results for input(s): DDIMER in the last 168 hours.   Radiology/Studies:  DG Chest 2 View  Result Date: 02/12/2021 CLINICAL DATA:  Cough EXAM: CHEST - 2 VIEW  COMPARISON:  Chest x-ray dated 12/19/2015 FINDINGS: Heart size and mediastinal contours are within normal limits. Median sternotomy wires appear intact and stable in alignment. Lungs are clear. No pleural effusion or pneumothorax is seen. No acute-appearing osseous abnormality. IMPRESSION: No active cardiopulmonary disease. No evidence of pneumonia or pulmonary edema. Electronically Signed   By: Franki Cabot M.D.   On: 02/10/2021 18:02   DG Abd 1 View  Result Date: 02/14/2021 CLINICAL DATA:  88 year old female with history of nausea and vomiting. EXAM: ABDOMEN - 1 VIEW COMPARISON:  Abdominal radiograph 06/24/2018. FINDINGS: Gas and stool are seen scattered throughout the colon extending to the level of the distal rectum. No pathologic distension of small bowel is noted. No gross evidence of pneumoperitoneum. Multiple phleboliths are noted in the anatomic pelvis. IMPRESSION: 1. Nonobstructive bowel gas pattern. 2. No pneumoperitoneum. Electronically Signed   By: Vinnie Langton M.D.   On: 02/14/2021 05:34   CT Head Wo Contrast  Result Date: 02/16/2021 CLINICAL DATA:  Head trauma, moderate to severe. Fall. Lacerations to the back of the head. No blood thinners. EXAM: CT HEAD WITHOUT CONTRAST TECHNIQUE: Contiguous axial images were obtained from the base of the skull through the vertex without intravenous contrast. RADIATION DOSE REDUCTION: This exam was performed according to the departmental dose-optimization program which includes automated exposure control, adjustment of the mA and/or kV according to patient size and/or use of iterative reconstruction technique. COMPARISON:  Maxillofacial CT 06/25/2018 FINDINGS: Brain: Diffuse cerebral atrophy. Ventricular dilatation consistent with central atrophy. Low-attenuation changes in the deep white matter consistent with small vessel ischemia. No abnormal extra-axial fluid collections. No mass effect or midline shift. Gray-white matter junctions are distinct. Basal  cisterns are not effaced. No acute intracranial hemorrhage. Vascular: Extensive intracranial arterial vascular calcifications. Skull: Calvarium appears intact. Subcutaneous scalp hematoma over the left posterior occipital region. Sinuses/Orbits: Paranasal sinuses and mastoid air cells are clear. Other: None. IMPRESSION: No acute intracranial abnormalities. Chronic atrophy and small vessel ischemic changes. Electronically Signed   By: Lucienne Capers M.D.   On: 02/09/2021 17:44   CT Cervical Spine Wo Contrast  Result Date: 02/17/2021 CLINICAL DATA:  Neck trauma due to a fall. EXAM: CT CERVICAL SPINE WITHOUT CONTRAST TECHNIQUE: Multidetector CT imaging of the cervical spine was performed without intravenous contrast. Multiplanar CT image reconstructions were also generated. RADIATION DOSE REDUCTION: This exam was performed according to the departmental dose-optimization program which includes automated exposure control, adjustment of the mA and/or kV according to patient size and/or use of iterative reconstruction technique. COMPARISON:  None. FINDINGS: Alignment: Normal alignment of the cervical vertebrae and posterior elements. C1-2 articulation appears intact. Skull base and vertebrae: Skull base appears intact. No vertebral compression deformities. No focal bone lesion or bone destruction.  Bone cortex appears intact. Soft tissues and spinal canal: No prevertebral soft tissue swelling. No abnormal paraspinal soft tissue mass or infiltration. Prominent vascular calcifications in the cervical carotid arteries. Disc levels: Diffuse degenerative changes with narrowed interspaces and endplate osteophyte formation throughout. Degenerative changes in the facet joints. Uncovertebral spurring at multiple levels causes encroachment upon the neural foramina. Upper chest: Visualized lung apices are clear. Other: None. IMPRESSION: 1. Normal alignment.  No acute displaced fractures. 2. Diffuse degenerative change throughout  the cervical spine. 3. Extensive vascular calcifications. Electronically Signed   By: Lucienne Capers M.D.   On: 03/01/2021 17:46   CT ABDOMEN PELVIS W CONTRAST  Result Date: 02/12/2021 CLINICAL DATA:  Right lower quadrant abdominal pain. EXAM: CT ABDOMEN AND PELVIS WITH CONTRAST TECHNIQUE: Multidetector CT imaging of the abdomen and pelvis was performed using the standard protocol following bolus administration of intravenous contrast. RADIATION DOSE REDUCTION: This exam was performed according to the departmental dose-optimization program which includes automated exposure control, adjustment of the mA and/or kV according to patient size and/or use of iterative reconstruction technique. CONTRAST:  28m OMNIPAQUE IOHEXOL 300 MG/ML  SOLN COMPARISON:  None. FINDINGS: Lower chest: Mild patchy volume loss in the right lower lobe. Dependent subpleural atelectasis in the left lower lobe. Heart size normal. No pericardial or pleural effusion. Distal esophagus is grossly unremarkable. Small hiatal hernia. Hepatobiliary: Liver is slightly decreased in attenuation diffusely with sparing along the gallbladder fossa. Liver and gallbladder are otherwise unremarkable. No biliary ductal dilatation. Pancreas: Negative. Spleen: Negative. Adrenals/Urinary Tract: Adrenal glands and right kidney are unremarkable. 10 mm fluid density cyst in the interpolar left kidney. Right kidney is otherwise unremarkable. Ureters are decompressed. Bladder is grossly unremarkable. Stomach/Bowel: Small hiatal hernia. Stomach, small bowel and colon are unremarkable. Appendix is not readily visualized but there are no inflammatory changes in the right lower quadrant to suspect appendicitis. Vascular/Lymphatic: Atherosclerotic calcification of the aorta. No pathologically enlarged lymph nodes. Reproductive: Uterus is visualized. Probable calcified uterine fibroids. No adnexal mass. Other: No free fluid.  Mesenteries and peritoneum are unremarkable.  Musculoskeletal: Degenerative changes in the spine.  Osteopenia. IMPRESSION: 1. Appendix is not readily identified but there are no inflammatory findings in the right lower quadrant to suggest appendicitis. No findings to explain the patient's right lower quadrant. 2. Hepatic steatosis. 3.  Aortic atherosclerosis (ICD10-I70.0). Electronically Signed   By: MLorin PicketM.D.   On: 02/12/2021 12:49   DG CHEST PORT 1 VIEW  Result Date: 02/14/2021 CLINICAL DATA:  Aspiration pneumonia.  Vomiting. EXAM: PORTABLE CHEST 1 VIEW COMPARISON:  Chest two views 03/02/2021 FINDINGS: Status post median sternotomy and CABG. Cardiac silhouette is again at the upper limits of normal size. Calcification is again seen within aortic arch. The lungs are clear. No pleural effusion or pneumothorax. No acute skeletal abnormality. IMPRESSION: No active pulmonary process. Electronically Signed   By: RYvonne KendallM.D.   On: 02/14/2021 12:53   ECHOCARDIOGRAM COMPLETE  Result Date: 02/14/2021    ECHOCARDIOGRAM REPORT   Patient Name:   VTARALYNN QUIETTSUMMERS Date of Exam: 02/14/2021 Medical Rec #:  [id number]     Height:       61.0 in Accession #:    [id number]    Weight:       109.6 lb Date of Birth:  911/02/1931     BSA:          1.463 m Patient Age:    89years  BP:           81/42 mmHg Patient Gender: F              HR:           78 bpm. Exam Location:  Inpatient Procedure: 2D Echo, Cardiac Doppler, Color Doppler and Intracardiac            Opacification Agent STAT ECHO Indications:    Shock  History:        Patient has prior history of Echocardiogram examinations. Risk                 Factors:Diabetes.  Sonographer:    Jyl Heinz Referring Phys: [id number] [place]  1. Left ventricular ejection fraction, by estimation, is 40 to 45%. The left ventricle has mildly decreased function. The left ventricle demonstrates global hypokinesis. There is mild left ventricular hypertrophy. Left ventricular diastolic parameters are  indeterminate.  2. Right ventricular systolic function is moderately reduced. The right ventricular size is not well visualized. There is normal pulmonary artery systolic pressure.  3. No evidence of mitral valve regurgitation.  4. The aortic valve was not well visualized. Aortic valve regurgitation is trivial.  5. The inferior vena cava is normal in size with <50% respiratory variability, suggesting right atrial pressure of 8 mmHg. Conclusion(s)/Recommendation(s): EF more reduced compared to prior echo 06/26/2018. FINDINGS  Left Ventricle: Left ventricular ejection fraction, by estimation, is 40 to 45%. The left ventricle has mildly decreased function. The left ventricle demonstrates global hypokinesis. The left ventricular internal cavity size was normal in size. There is  mild left ventricular hypertrophy. Left ventricular diastolic parameters are indeterminate. Right Ventricle: The right ventricular size is not well visualized. Right vetricular wall thickness was not well visualized. Right ventricular systolic function is moderately reduced. There is normal pulmonary artery systolic pressure. The tricuspid regurgitant velocity is 1.91 m/s, and with an assumed right atrial pressure of 8 mmHg, the estimated right ventricular systolic pressure is 73.7 mmHg. Left Atrium: Left atrial size was normal in size. Right Atrium: Right atrial size was normal in size. Pericardium: There is no evidence of pericardial effusion. Mitral Valve: No evidence of mitral valve regurgitation. Tricuspid Valve: The tricuspid valve is grossly normal. Tricuspid valve regurgitation is mild. Aortic Valve: The aortic valve was not well visualized. Aortic valve regurgitation is trivial. Aortic regurgitation PHT measures 309 msec. Aortic valve mean gradient measures 10.0 mmHg. Aortic valve peak gradient measures 17.0 mmHg. Aortic valve area, by  VTI measures 0.77 cm. Pulmonic Valve: The pulmonic valve was grossly normal. Pulmonic valve  regurgitation is not visualized. Aorta: The aortic root and ascending aorta are structurally normal, with no evidence of dilitation. Venous: The inferior vena cava is normal in size with less than 50% respiratory variability, suggesting right atrial pressure of 8 mmHg. IAS/Shunts: No atrial level shunt detected by color flow Doppler.  LEFT VENTRICLE PLAX 2D LVIDd:         3.10 cm     Diastology LVIDs:         2.60 cm     LV e' medial:    4.79 cm/s LV PW:         1.00 cm     LV E/e' medial:  21.9 LV IVS:        1.10 cm     LV e' lateral:   8.05 cm/s LVOT diam:     2.00 cm     LV E/e' lateral: 13.0  LV SV:         28 LV SV Index:   19 LVOT Area:     3.14 cm  LV Volumes (MOD) LV vol d, MOD A2C: 70.3 ml LV vol d, MOD A4C: 46.1 ml LV vol s, MOD A2C: 41.5 ml LV vol s, MOD A4C: 28.5 ml LV SV MOD A2C:     28.8 ml LV SV MOD A4C:     46.1 ml LV SV MOD BP:      22.4 ml RIGHT VENTRICLE            IVC RV Basal diam:  2.80 cm    IVC diam: 1.90 cm RV Mid diam:    2.40 cm RV S prime:     2.93 cm/s TAPSE (M-mode): 0.6 cm LEFT ATRIUM             Index        RIGHT ATRIUM          Index LA diam:        3.40 cm 2.32 cm/m   RA Area:     9.96 cm LA Vol (A2C):   54.6 ml 37.33 ml/m  RA Volume:   19.50 ml 13.33 ml/m LA Vol (A4C):   40.2 ml 27.49 ml/m LA Biplane Vol: 49.9 ml 34.12 ml/m  AORTIC VALVE AV Area (Vmax):    0.92 cm AV Area (Vmean):   0.87 cm AV Area (VTI):     0.77 cm AV Vmax:           206.00 cm/s AV Vmean:          155.500 cm/s AV VTI:            0.358 m AV Peak Grad:      17.0 mmHg AV Mean Grad:      10.0 mmHg LVOT Vmax:         60.40 cm/s LVOT Vmean:        43.100 cm/s LVOT VTI:          0.088 m LVOT/AV VTI ratio: 0.25 AI PHT:            309 msec  AORTA Ao Root diam: 3.10 cm Ao Asc diam:  3.00 cm MITRAL VALVE                TRICUSPID VALVE MV Area (PHT): 4.01 cm     TR Peak grad:   14.6 mmHg MV Decel Time: 189 msec     TR Vmax:        191.00 cm/s MV E velocity: 105.00 cm/s                             SHUNTS                              Systemic VTI:  0.09 m                             Systemic Diam: 2.00 cm [name] [name] Electronically signed by [name] [name] Signature Date/Time: 02/14/2021/5:51:02 PM    Final    DG HIP UNILAT WITH PELVIS 2-3 VIEWS RIGHT  Result Date: 02/13/2021 CLINICAL DATA:  Fall, right hip pain EXAM: DG HIP (WITH OR WITHOUT PELVIS) 3V RIGHT COMPARISON:  None. FINDINGS: No evidence of hip fracture or dislocation. Mild degenerative changes of  the bilateral hips and SI joints. Vascular calcifications. Excreted contrast material noted in the bladder, likely related to prior CT with contrast. IMPRESSION: No evidence of right hip fracture or dislocation. Electronically Signed   By: Yetta Glassman M.D.   On: 02/13/2021 15:45   US Abdomen Limited RUQ (LIVER/GB)  Result Date: 02/20/2021 CLINICAL DATA:  Abdominal pain EXAM: ULTRASOUND ABDOMEN LIMITED RIGHT UPPER QUADRANT COMPARISON:  06/25/2018 FINDINGS: Gallbladder: Small gallstones measuring up to 7 mm. No wall thickening or sonographic Murphy sign. Common bile duct: Diameter: Normal caliber, 5-6 mm. Liver: No focal lesion identified. Within normal limits in parenchymal echogenicity. Portal vein is patent on color Doppler imaging with normal direction of blood flow towards the liver. Other: None. IMPRESSION: Cholelithiasis.  No sonographic evidence of acute cholecystitis. Electronically Signed   By: Rolm Baptise M.D.   On: 02/12/2021 22:15     Assessment and Plan:   Hypotension: Unclear if cardiogenic shock versus septic shock.  She required both pressor and IV antibiotic.  Patient has significant lactic acidosis with venous lactic acid greater than 9.  Procalcitonin 21.28.  Patient had A-fib with RVR with heart rate up to 140s around the same time, however even after she converted on IV amiodarone back to sinus rhythm this afternoon, blood pressure remained low.  Per MD, patient is confused and unable to give meaningful report.  She is not on a candidate for  invasive study given her current mental status.  History of dementia as noted.  I am not sure if she is always this confused, or if this is part of her acute illness.  LV dysfunction: Patient has a history of ischemic cardiomyopathy with improved EF.  Repeat echocardiogram today shows EF is now down to 40 to 45% with evidence of RV failure as well.  Unclear if there could be a PE.  However given the recent atrial fibrillation, would advise to start on IV heparin empirically.  Heparin would also be beneficial for PE.  Contraction pattern shows intact apical contraction but hypokinesis at the bases of the LV.  The RV appears dilated and does not contract well.  Atrial fibrillation with RVR: Converted on IV amiodarone.  CAD s/p CABG 2017: Elevated troponin.  Not surprising in the setting of underlying illness.  Not reporting chest pain but not answering questions.  History of ischemic cardiomyopathy with improved EF  Hypertension: Holding antihypertensives at this time.  Hyperlipidemia/DM2: long-term risk factor modification     Risk Assessment/Risk Scores:          CHA2DS2-VASc Score = 7   This indicates a 11.2% annual risk of stroke. The patient's score is based upon: CHF History: 1 HTN History: 1 Diabetes History: 1 Stroke History: 0 Vascular Disease History: 1 Age Score: 2 Gender Score: 1         For questions or updates, please contact [place] Please consult [email or web address] for contact info under    Signed, Larae Grooms, MD  02/14/2021 6:41 PM

## 2021-02-14 NOTE — Progress Notes (Signed)
Initial Nutrition Assessment  DOCUMENTATION CODES:   Severe malnutrition in context of chronic illness, Severe malnutrition in context of social or environmental circumstances  INTERVENTION:  -[employer] Instant Breakfast TID w/ meals -PROSource PLUS PO 61mls BID, each supplement provides 100 kcals and 15 grams of protein -Downgrade diet to dysphagia 2 given pt edentulous and does not have dentures with her currently (plan to advance to dysphagia 3 once dentures are brought to hospital) -MVI with minerals daily -snacks TID  NUTRITION DIAGNOSIS:   Severe Malnutrition related to chronic illness, social / environmental circumstances (dementia) as evidenced by severe fat depletion, severe muscle depletion, percent weight loss.  GOAL:   Patient will meet greater than or equal to 90% of their needs  MONITOR:   PO intake, Supplement acceptance, Labs, Weight trends, I & O's  REASON FOR ASSESSMENT:   Consult Poor PO  ASSESSMENT:   Pt with PMH significant for type 2 DM, CKD stg II, HLD, and dementia presenting after 2 falls at home.  Pt sleeping at time of visit and did not wake to RD voice/touch. Pt's family in room providing history. Reports pt was doing well until a little over a month ago when the family began having difficult providing their normal care for the pt 2/2 COVID+ exposures and additional health concerns. As a result, pt has not been able to receive as much help with meals which family reports has led to a rapid decline in pt's physical and mental capabilities. Also endorses pt having ~20 lb weight loss as a result. Family is hopeful for return to pt's baseline (or close to) with improved nutrition. Discussed options with pt's son who is agreeable to changing diet order to aide in increasing PO intake (pt edentulous and does not have dentures currently). Will plan to advance to dysphagia 3 when pt has dentures brought to hospital. Also agreeable to ONS, though noted pt dislikes  Ensure. Discussed options for improving pt's nutrition status post discharge including meal sources to help alleviate some of the burden from the family. Also discussed possibility of HHRN. Pt's family planning to discuss with CM/SW.   PO Intake: 0% since admit  Pt's son also reported pt with emesis x9 over 12 hour period since admission (approximately from 02/13/21 @ 1900 until 02/14/21 at 0700), denies any further episodes of emesis. Hopeful this will improve now that pt has had a bowel movement as son also reports pt was constipated.   If pt's PO intake does not improve within 24-48 hours, recommend consideration of Cortrak placement if aligned with GOC to aid in meeting pt's calorie/protein needs.    Per weight history, pt weighed 49.4 kg on 01/10/21 and now weighs 46.3 kg. This indicates a clinically significant 6.3% weight loss x1 month.   UOP: 184ml x24 hours I/O: +3653ml since admit  Medications: SSI TID w/ meals and bedtime, remeron, protonix, levophed gtt Labs: Recent Labs  Lab 02/10/2021 1915 02/12/21 0207 02/13/21 1110 02/14/21 0730  NA 129* 130* 134* 132*  K 4.1 3.5 3.5 3.8  CL 94* 98 104 98  CO2 15* 16* 20* 17*  BUN 14 14 9 11   CREATININE 1.29* 1.16* 0.78 1.12*  CALCIUM 8.8* 8.2* 8.0* 8.2*  MG 1.4* 2.0  --   --   GLUCOSE 139* 97 106* 106*  CBGs: 88-207 x24 hours    NUTRITION - FOCUSED PHYSICAL EXAM:  Flowsheet Row Most Recent Value  Orbital Region Moderate depletion  Upper Arm Region Severe depletion  Thoracic  and Lumbar Region Severe depletion  Buccal Region Severe depletion  Temple Region Severe depletion  Clavicle Bone Region Severe depletion  Clavicle and Acromion Bone Region Severe depletion  Scapular Bone Region Severe depletion  Dorsal Hand Severe depletion  Patellar Region Severe depletion  Anterior Thigh Region Severe depletion  Posterior Calf Region Severe depletion  Edema (RD Assessment) None  Hair Reviewed  Eyes Unable to assess  Mouth Other  (Comment)  [edentulous]  Skin Other (Comment)  [dry, excessively wrinkled -- dehydrated appearance]  Nails Reviewed       Diet Order:   Diet Order             Diet heart healthy/carb modified Room service appropriate? Yes; Fluid consistency: Thin  Diet effective now                   EDUCATION NEEDS:   Education needs have been addressed  Skin:  Skin Assessment: Skin Integrity Issues: Skin Integrity Issues:: Other (Comment) Other: Head - stapled laceration  Last BM:  2/8  Height:   Ht Readings from Last 1 Encounters:  02/12/21 5\' 1"  (1.549 m)    Weight:   Wt Readings from Last 1 Encounters:  02/12/21 46.3 kg    BMI:  Body mass index is 19.29 kg/m.  Estimated Nutritional Needs:   Kcal:  1400-1600  Protein:  70-80 grams  Fluid:  >1.4L/day     Theone Stanley., MS, RD, LDN (she/her/hers) RD pager number and weekend/on-call pager number located in [place].

## 2021-02-14 NOTE — Consult Note (Signed)
NAME:  [name] [name], MRN:  [id number], DOB:  1931/03/10, LOS: 2 ADMISSION DATE:  02/13/2021, CONSULTATION DATE:  02/14/21 REFERRING MD:  Dr. Manuella Ghazi, CHIEF COMPLAINT:  Hypotension   History of Present Illness:   88 year old female with prior history significant for dementia, CKDII, CAD, DMT2, HTN, and anemia admitted to [facility] on 2/5 for AKI and hypovolemic hyponatremia.  Patient apparently lives alone but has family next door.  She presented after having several falls at home with a posterior scalp laceration on 2/5.  Family reported poor PO intake at home.     Workup was negative for CTH and CT cervical, but labs showed Na 129 and sCr 1.29.  Cardiac workup benign and UA neg.  She was started on IV fluids with improving renal function.  She had complained of some right lower abdominal pain, therefore a CT abd/ pelvis was completed which did not show any acute findings.    Overnight on 2/7 she had some nausea and vomiting with dark brown emesis and noted to be hypotensive into the 60's.  She was given 2L NS but remains hypotensive.  Urine and blood cultures sent.  She has been afebrile and on room air.   WBC up 7.1> 14.2, Hgb stable, and sCr 0.78> 1.12.  TRH discussed goals of care with son and patient remains a full code.  PCCM consulted for further evaluation.   Pertinent  Medical History  Dementia, CKD stage II, CAD, DMT2, anemia, GERD, HTN, HLD, thyroid disease, diverticulosis, cataracts  Significant Hospital Events: Including procedures, antibiotic start and stop dates in addition to other pertinent events   2/5 Admitted to [facility] with AKI and hypovolemic hyponatremia s/p multiple falls at home.  Posterior scalp lac stapled.  CTH/ cervical neg.  2/6 CT abd/pelvis neg for acute findings 2/8 N/V with dark brown emesis, hypotensive despite 2L NS bolus, PCCM consulted, cultured   2/5 SARS/ flu > neg 2/8 Bcx2 >  2/8 UC >  2/8 expectorated sputum >  Interim History / Subjective:   Objective    Blood pressure (!) 70/46, pulse 85, temperature 97.9 F (36.6 C), temperature source Oral, resp. rate 16, height _0  (1.549 m), weight 46.3 kg, SpO2 98 %.        Intake/Output Summary (Last 24 hours) at 02/14/2021 1258 Last data filed at 02/14/2021 1126 Gross per 24 hour  Intake 1237.01 ml  Output 100 ml  Net 1137.01 ml   Filed Weights   02/23/2021 1641 02/12/21 1232  Weight: 46.7 kg 46.3 kg   Examination: General: cachectic/ill appearing elderly female HEENT: MM pink/moist Neuro: AO; MAE; pleasantly demented CV: s1s2, no m/r/g; SBP 60-70s PULM:  dim clear BS bilaterally; on room air GI: soft, bsx4 active; vomiting brownish color Extremities: warm/dry, no edema  Skin: no rashes or lesions appreciated   Resolved Hospital Problem list    Assessment & Plan:   Shock: Likely septic w/ wbc trending up P: -admit to ICU -continuous telemetry -peripheral levo for MAP goal >65 -IV fluids given -check UA/urine culture and bcx2 -CXR clear -send expectorated sputum -broad spectrum abx -trend lactate -check procalc  -check H/H  N/V: dark brown emesis  Right lower quadrant pain CT abdomen/pelivs 2/6 no signs of appendicitis Hip x-ray w/out evidence of hip fracture RUQ [place] 2/5: cholelithiasis; no cholecystitis P: -prn tylenol for pain -continue to monitor -prn zofran for nausea -will start PPI   Fall at home; laceration of posterior scalp P: -will need f/u  in 7-10 days for staple removal -prn tylenol for pain -PT/OT -will likely need SNIF when discharged   AKI on CKD stage 2 P: -hold home ace inhibitor -iv fluids -Trend BMP / urinary output -Replace electrolytes as indicated -Avoid nephrotoxic agents, ensure adequate renal perfusion   Hyponatremia: likely hypovolemic P: -IV fluids -trend bmp   T2DM P: -SSI and cbg   Moderate dementia: lives alone but has family next door. P: -avoid sedating meds -consider starting home prevagen   Hx HTN and  HLD P: -hold anti-hypertensives -continue statin    Best Practice (right click and "Reselect all SmartList Selections" daily)   Diet/type: clear liquids DVT prophylaxis: LMWH GI prophylaxis: PPI Lines: N/A Foley:  N/A Code Status:  full code Last date of multidisciplinary goals of care discussion [2/8 Dr. Tacy Learn and I had [place] conversation w/ son at bedside who is POA. He states that he wants everything done at this time. He has other family member who have lived past 102 years. He's okay with [place] transferring patient to ICU. He's okay with intubation if necessary. He does not think his mom would want to be on ventilator long term but said we can address that when we need to.]  Labs   CBC: Recent Labs  Lab 02/25/2021 1813 02/12/21 0207 02/13/21 0402 02/14/21 0730  WBC 6.1 7.7 7.1 14.2*  NEUTROABS 4.7 4.4 4.6 11.7*  HGB 12.0 9.9* 9.0* 10.0*  HCT 35.5* 28.5* 25.4* 28.6*  MCV 91.0 88.8 87.9 87.7  PLT PLATELET CLUMPS NOTED ON SMEAR, UNABLE TO ESTIMATE 127* 103* 119*    Basic Metabolic Panel: Recent Labs  Lab 03/05/2021 1915 02/12/21 0207 02/13/21 1110 02/14/21 0730  NA 129* 130* 134* 132*  K 4.1 3.5 3.5 3.8  CL 94* 98 104 98  CO2 15* 16* 20* 17*  GLUCOSE 139* 97 106* 106*  BUN _0 CREATININE 1.29* 1.16* 0.78 1.12*  CALCIUM 8.8* 8.2* 8.0* 8.2*  MG 1.4* 2.0  --   --    GFR: Estimated Creatinine Clearance: 25.9 mL/min (A) (by C-G formula based on SCr of 1.12 mg/dL (H)). Recent Labs  Lab 02/18/2021 1813 02/24/2021 2138 02/12/21 0207 02/13/21 0402 02/14/21 0730  WBC 6.1  --  7.7 7.1 14.2*  LATICACIDVEN  --  1.2  --   --   --     Liver Function Tests: Recent Labs  Lab 02/25/2021 1915 02/12/21 0207  AST 28 23  ALT 27 21  ALKPHOS 31* 26*  BILITOT 1.7* 1.2  PROT 7.1 6.2*  ALBUMIN 4.0 3.4*   Recent Labs  Lab 02/16/2021 1915  LIPASE 65*   No results for input(s): AMMONIA in the last 168 hours.  ABG    Component Value Date/Time   PHART 7.345 (L) 11/17/2015  0323   PCO2ART 37.6 11/17/2015 0323   PO2ART 80.0 (L) 11/17/2015 0323   HCO3 20.4 11/17/2015 0323   TCO2 22 11/17/2015 1647   ACIDBASEDEF 5.0 (H) 11/17/2015 0323   O2SAT 95.0 11/17/2015 0323     Coagulation Profile: No results for input(s): INR, PROTIME in the last 168 hours.  Cardiac Enzymes: Recent Labs  Lab 03/05/2021 1915  CKTOTAL 176    HbA1C: Hgb A1c MFr Bld  Date/Time Value Ref Range Status  02/01/2021 02:32 PM 6.7 (H) <5.7 % of total Hgb Final    Comment:    For someone without known diabetes, a hemoglobin A1c value of 6.5% or greater indicates that they may have  diabetes and this should be confirmed with a follow-up  test. . For someone with known diabetes, a value <7% indicates  that their diabetes is well controlled and a value  greater than or equal to 7% indicates suboptimal  control. A1c targets should be individualized based on  duration of diabetes, age, comorbid conditions, and  other considerations. . Currently, no consensus exists regarding use of hemoglobin A1c for diagnosis of diabetes for children. Marland Kitchen   10/04/2020 04:30 PM 6.9 (H) <5.7 % of total Hgb Final    Comment:    For someone without known diabetes, a hemoglobin A1c value of 6.5% or greater indicates that they may have  diabetes and this should be confirmed with a follow-up  test. . For someone with known diabetes, a value <7% indicates  that their diabetes is well controlled and a value  greater than or equal to 7% indicates suboptimal  control. A1c targets should be individualized based on  duration of diabetes, age, comorbid conditions, and  other considerations. . Currently, no consensus exists regarding use of hemoglobin A1c for diagnosis of diabetes for children. .     CBG: Recent Labs  Lab 02/13/21 1136 02/13/21 1605 02/13/21 2113 02/14/21 0734 02/14/21 1127  GLUCAP 108* 153* 207* 88 89    Review of Systems:   Patient mildly lethargic and not answering  questions. Unable to obtain.  Past Medical History:  She,  has a past medical history of Anemia, Anginal pain ([place]), Cataract, Colon polyp (2009), Coronary artery disease, Depression, Diabetes ([place]), Diverticulosis of colon (without mention of hemorrhage) (2009), Esophagitis (2009), Gastritis (2009), GERD (gastroesophageal reflux disease) (2009), Hiatal hernia (2009), Hyperlipemia, Hypertension, and Thyroid disease.   Surgical History:   Past Surgical History:  Procedure Laterality Date   BREAST BIOPSY Left 1999   neg   CARDIAC CATHETERIZATION N/A 11/15/2015   Procedure: Left Heart Cath and Coronary Angiography;  Surgeon: Troy Sine, MD;  Location: [facility];  Service: Cardiovascular;  Laterality: N/A;   COLONOSCOPY  05/2012   Dr. Sharlett Iles   CORONARY ARTERY BYPASS GRAFT  11/16/2015   Procedure: OFF PUMP CORONARY ARTERY BYPASS GRAFTING (CABG) x FOUR, USING LEFT MAMMARY ARTERY  AND ENDOSCOPICALLY HARVESTED RIGHT GREATER SAPHENOUS VEIN.;  Surgeon: Melrose Nakayama, MD;  Location: [facility];  Service: Open Heart Surgery;;   DILATION AND CURETTAGE OF UTERUS  1984   TEE WITHOUT CARDIOVERSION N/A 11/16/2015   Procedure: TRANSESOPHAGEAL ECHOCARDIOGRAM (TEE);  Surgeon: Melrose Nakayama, MD;  Location: [facility];  Service: Open Heart Surgery;  Laterality: N/A;     Social History:   reports that she has never smoked. Her smokeless tobacco use includes snuff. She reports that she does not drink alcohol and does not use drugs.   Family History:  Her family history includes Breast cancer in her maternal aunt and maternal grandmother; Diabetes in her father; Lung cancer in her brother; Prostate cancer in her brother; Stroke in her mother; Throat cancer in her maternal uncle. There is no history of Rectal cancer, Stomach cancer, Colon cancer, or Esophageal cancer.   Allergies Allergies  Allergen Reactions   Bee Venom Anaphylaxis and Other (See Comments)    Eyes swell, cannot breathe    Persantine [Dipyridamole] Other (See Comments)    "went crazy"   Actos [Pioglitazone] Other (See Comments)    UNSPECIFIED REACTION      Home Medications  Prior to Admission medications   Medication Sig Start Date End Date Taking? Authorizing  Provider  Apoaequorin (PREVAGEN PO) Take 1 tablet by mouth See admin instructions. For 30 days alternating with neurvia   Yes [provider]  aspirin EC 81 MG tablet Take 1 tablet (81 mg total) by mouth daily. 09/22/18  Yes Jerline Pain, MD  atorvastatin (LIPITOR) 80 MG tablet Take 1 tablet (80 mg total) by mouth daily. for cholesterol 01/10/21  Yes Conte, Tessa N, PA-C  blood glucose meter kit and supplies KIT Dispense based on patient and insurance preference. Check blood sugar 1 time daily-[id number]. 11/11/18  Yes [name], MD  EPINEPHrine 0.3 MG/0.3ML SOSY Inject into skin for anaphylaxis as needed Patient taking differently: Inject 0.3 mg into the muscle as needed (anaphylaxis). 10/06/20  Yes Magda Bernheim, NP  isosorbide mononitrate (IMDUR) 30 MG 24 hr tablet Take 1 tablet Daily for Heart Patient taking differently: Take 30 mg by mouth daily. 01/10/21  Yes Conte, Tessa N, PA-C  lisinopril (ZESTRIL) 2.5 MG tablet Take 1 tablet Daily for BP & Diabetic Kidney Protection Patient taking differently: Take 2.5 mg by mouth daily. 01/10/21  Yes Conte, Tessa N, PA-C  metFORMIN (GLUCOPHAGE-XR) 500 MG 24 hr tablet Take 2 tablets 2 x /day with Meals for Diabetes Patient taking differently: Take 500 mg by mouth in the morning and at bedtime. 11/30/19  Yes Liane Comber, NP  Misc Natural Products (NEURIVA PO) Take 1 tablet by mouth See admin instructions. Every 30 days altering with prevagen   Yes [provider]  nitroGLYCERIN (NITROSTAT) 0.4 MG SL tablet Dissolve 1 tablet under tongue every 3 to 5 minutes if needed for ChestPain Patient taking differently: Place 0.4 mg under the tongue every 5 (five) minutes as needed for chest pain. 01/10/21  12/03/23 Yes Conte, Tessa N, PA-C  senna ([employer] SENNA) 8.6 MG tablet TAKE 1 TABLET AT BEDTIME FOR CONSTIPATION Patient taking differently: Take 1 tablet by mouth daily as needed for constipation. 09/01/19  Yes [name], MD  mirtazapine (REMERON) 15 MG tablet Take  1 tablet  at Bedtime  for Sleep & Appetite Patient not taking: Reported on 02/08/2021 02/02/21   [name], MD     Critical care time: 45 minutes    JD Rexene Agent [facility] 02/14/2021, 1:42 PM  Please see [email or web address] for pager details.  From 7A-7P if no response, please call [phone]. After hours, please call ELink [phone].

## 2021-02-14 NOTE — Progress Notes (Signed)
MD on call made aware of above.

## 2021-02-14 NOTE — Progress Notes (Signed)
PT Cancellation Note  Patient Details Name: TIMIKA MUENCH MRN: [id number] DOB: Oct 19, 1931   Cancelled Treatment:    Reason Eval/Treat Not Completed: Medical issues which prohibited therapy BP as low 68/43. PT will hold and re-attempt at later date.   Tobiah Celestine A. Gilford Rile PT, DPT Acute Rehabilitation Services Pager [phone] Office [phone]    Linna Hoff 02/14/2021, 2:21 PM

## 2021-02-14 NOTE — Progress Notes (Signed)
PCCM Interval Progress Note  Contacted patient's son, [name] [name] at [phone] with an update regarding his mother's current clinical status.  I explained to [name] that shortly after shift change, [name] [name] became increasingly less responsive and bradycardic with HR dwindling into the 40s (likely multifactorial in the setting of worsening acidosis, sedation, possible cardiac involvement). PCCM was called to the bedside. She required escalation of respiratory support to BVM and subsequently was intubated with stabilization of vitals and continued weaning of pressors.  Manson Allan that will will continue to keep him updated with any clinical status changes. He did not have any further questions at this time.  Lestine Mount, PA-C [facility] 02/14/21 8:49 PM  Please see [email or web address] for pager details.  From 7A-7P if no response, please call [phone] After hours, please call ELink [phone]

## 2021-02-14 NOTE — Progress Notes (Signed)
Critical ABG results given to E-Link.

## 2021-02-14 NOTE — Progress Notes (Signed)
°   02/14/21 1100  Assess: MEWS Score  Temp 98.1 F (36.7 C)  BP (!) 61/39  Pulse Rate 94  ECG Heart Rate 97  Resp 16  Level of Consciousness Alert  SpO2 94 %  O2 Device Room Air  Assess: MEWS Score  MEWS Temp 0  MEWS Systolic 3  MEWS Pulse 0  MEWS RR 0  MEWS LOC 0  MEWS Score 3  MEWS Score Color Yellow  Assess: if the MEWS score is Yellow or Red  Were vital signs taken at a resting state? Yes  Focused Assessment No change from prior assessment  Early Detection of Sepsis Score *See Row Information* Medium  MEWS guidelines implemented *See Row Information* Yes  Take Vital Signs  Increase Vital Sign Frequency  Yellow: Q 2hr X 2 then Q 4hr X 2, if remains yellow, continue Q 4hrs  Notify: Charge Nurse/RN  Name of Charge Nurse/RN Notified Candice A  Date Charge Nurse/RN Notified 02/14/21  Time Charge Nurse/RN Notified 1207  Notify: Provider  Provider Name/Title [facility]  Date Provider Notified 02/14/21  Time Provider Notified [phone]  Document  Patient Outcome Other (Comment) (bous order, continue to monitor)  Progress note created (see row info) Yes

## 2021-02-14 NOTE — Procedures (Signed)
Intubation Procedure Note  ISHANI GOLDWASSER  [id number]  Apr 28, 1931  Date:02/14/21  Time:8:27 PM   Provider Performing:Deseree Zemaitis M Ayesha Rumpf    Procedure: Intubation (31500)  Indication(s) Respiratory Failure  Consent Unable to obtain consent due to emergent nature of procedure.  Anesthesia Etomidate  Time Out Verified patient identification, verified procedure, site/side was marked, verified correct patient position, special equipment/implants available, medications/allergies/relevant history reviewed, required imaging and test results available.  Sterile Technique Usual hand hygiene, masks, and gloves were used  Procedure Description Patient positioned in bed supine.  Sedation given as noted above.  Patient was intubated with endotracheal tube using Glidescope.  View was Grade 1 full glottis .  Number of attempts was 1.  Colorimetric CO2 detector was consistent with tracheal placement.  Complications/Tolerance None; patient tolerated the procedure well. Chest X-ray is ordered to verify placement.   EBL Minimal   Specimen(s) None  Lestine Mount, [facility] 02/14/21 8:28 PM  Please see [email or web address] for pager details.  From 7A-7P if no response, please call [phone] After hours, please call ELink [phone]

## 2021-02-14 NOTE — Progress Notes (Addendum)
[facility] for heparin Indication: suspected pulmonary embolus  Heparin Dosing Weight: 49.7 kg  Labs: Recent Labs    02/08/2021 1915 02/12/2021 1915 02/12/21 0207 02/13/21 0402 02/13/21 1110 02/14/21 0730  HGB  --    < > 9.9* 9.0*  --  10.0*  HCT  --   --  28.5* 25.4*  --  28.6*  PLT  --   --  127* 103*  --  119*  CREATININE 1.29*  --  1.16*  --  0.78 1.12*  CKTOTAL 176  --   --   --   --   --    < > = values in this interval not displayed.    Assessment: 80 yof presenting with shock. Pharmacy consulted to dose heparin due to suspected PE given TTE. CTA unable to obtained due to agitation. Patient is not on anticoagulation PTA. Previously on heparin 5000 units SQ q8h this admit - last dose given at 1706 today. Hg low stable at 10, plt back up to 119 today. AKI - SCr back up to 1.12 . No bleed issues reported.  Goal of Therapy:  Heparin level 0.3-0.7 units/ml Monitor platelets by anticoagulation protocol: Yes   Plan:  D/c heparin SQ Heparin 2000 unit bolus (more conservative with proximity to recent heparin SQ dose and advanced age) Start heparin at 800 units/hr Check 6hr heparin level Monitor daily CBC, s/sx bleeding   Arturo Morton, PharmD, BCPS Clinical Pharmacist 02/14/2021 6:18 PM   ADDENDUM - Notified by RN heparin not yet started due to patient unstable and requiring emergent intubation. Cardiology cleared to start heparin drip now per discussion with RN. Will hold off on bolus at this time due to patient just undergoing central line placement this evening. No bleeding issues per RN. Noted SCr continuing to trend up further to 1.82, Hg 9.1 stable, plt stable 111.  Plan: Hold off on bolus with recent procedure. Start heparin at 800 units/hr Check 6hr heparin level Monitor daily CBC, s/sx bleeding   Arturo Morton, PharmD, BCPS Please check AMION for all [facility] contact numbers Clinical Pharmacist 02/14/2021 9:54 PM

## 2021-02-14 NOTE — Progress Notes (Signed)
Patient's bp was low 81/42 with MD being notified and with orders to bolus patient. 2nd 1L of NSS was bolus, patient is alert, warm to touch na with 99% O2 saturation.  Informed and made aware Charge RN, with patient being responsive and with O2 sat @ 98% . Will monitor V/S q 30. Son at bedside.

## 2021-02-14 NOTE — Progress Notes (Signed)
Patient continues to have on and off N/V. Vomitus dark brown approximately 100 cc. Alert and oriented to self only. Taken to [facility] to urinate twice this shift. Will continue to monitor.

## 2021-02-14 NOTE — Progress Notes (Signed)
[facility] Progress Note Patient Name: [name] DOB: 10/11/1931 MRN: [id number]   Date of Service  02/14/2021  HPI/Events of Note  I-STAT results reviewed.  eICU Interventions  Bicarb 100 meq iv bolus followed by Bicarb gtt, FiO2 reduced to 50 %, respiratory rate reduced to 18, Calcium gluconate 2 gm iv bolus x 1 ordered.        [name] [name] 02/14/2021, 11:17 PM

## 2021-02-14 NOTE — Plan of Care (Signed)
°  Problem: Health Behavior/Discharge Planning: Goal: Ability to manage health-related needs will improve Outcome: Progressing   Problem: Clinical Measurements: Goal: Ability to maintain clinical measurements within normal limits will improve Outcome: Progressing Goal: Cardiovascular complication will be avoided Outcome: Progressing   Problem: Activity: Goal: Risk for activity intolerance will decrease Outcome: Progressing   Problem: Nutrition: Goal: Adequate nutrition will be maintained 02/14/2021 1331 by Dolores Hoose, RN Outcome: Progressing 02/14/2021 1117 by Dolores Hoose, RN Outcome: Progressing   Problem: Elimination: Goal: Will not experience complications related to bowel motility Outcome: Progressing

## 2021-02-14 NOTE — Progress Notes (Signed)
An USGPIV (ultrasound guided PIV) has been placed for short-term vasopressor infusion. A correctly placed ivWatch must be used when administering Vasopressors. Should this treatment be needed beyond 72 hours, central line access should be obtained.  It will be the responsibility of the bedside nurse to follow best practice to prevent extravasations.   ?

## 2021-02-14 NOTE — Progress Notes (Signed)
Pharmacy Antibiotic Note  [name] [name] is a 88 y.o. female admitted on 02/24/2021 with sepsis.  Pharmacy has been consulted for cefepime/vancomycin dosing (started earlier this afternoon). Noted AKI - since antibiotics initiated, SCr continued to bump to 1.82  Plan: Continue cefepime 2g IV q24h for now Hold vancomycin for now and f/u renal function trend. May consider level in 48hrs prior to re-dosing Monitor clinical progress, c/s, renal function F/u de-escalation plan/LOT, vancomycin levels as indicated   Height: 5\' 1"  (154.9 cm) Weight: 49.7 kg (109 lb 9.1 oz) IBW/kg (Calculated) : 47.8  Temp (24hrs), Avg:98.1 F (36.7 C), Min:97.4 F (36.3 C), Max:98.5 F (36.9 C)  Recent Labs  Lab 02/23/2021 1813 03/01/2021 1915 02/28/2021 2138 02/12/21 0207 02/13/21 0402 02/13/21 1110 02/14/21 0730 02/14/21 1618 02/14/21 1651  WBC 6.1  --   --  7.7 7.1  --  14.2* 14.0*  --   CREATININE  --  1.29*  --  1.16*  --  0.78 1.12* 1.82*  --   LATICACIDVEN  --   --  1.2  --   --   --   --   --  >9.0*    Estimated Creatinine Clearance: 16.4 mL/min (A) (by C-G formula based on SCr of 1.82 mg/dL (H)).    Allergies  Allergen Reactions   Bee Venom Anaphylaxis and Other (See Comments)    Eyes swell, cannot breathe   Persantine [Dipyridamole] Other (See Comments)    "went crazy"   Actos [Pioglitazone] Other (See Comments)    UNSPECIFIED REACTION     Arturo Morton, PharmD, BCPS Please check AMION for all [place] contact numbers Clinical Pharmacist 02/14/2021 8:03 PM

## 2021-02-14 NOTE — Progress Notes (Signed)
°  Amiodarone Drug - Drug Interaction Consult Note  Recommendations: -No major interactions noted. -Watch for muscle pain/weakness on atorvastatin. -Watch for hypokalemia on Lasix. Amiodarone is metabolized by the cytochrome P450 system and therefore has the potential to cause many drug interactions. Amiodarone has an average plasma half-life of 50 days (range 20 to 100 days).   There is potential for drug interactions to occur several weeks or months after stopping treatment and the onset of drug interactions may be slow after initiating amiodarone.   [x]  Statins: Increased risk of myopathy. Simvastatin- restrict dose to 20mg  daily. Other statins: counsel patients to report any muscle pain or weakness immediately.  []  Anticoagulants: Amiodarone can increase anticoagulant effect. Consider warfarin dose reduction. Patients should be monitored closely and the dose of anticoagulant altered accordingly, remembering that amiodarone levels take several weeks to stabilize.  []  Antiepileptics: Amiodarone can increase plasma concentration of phenytoin, the dose should be reduced. Note that small changes in phenytoin dose can result in large changes in levels. Monitor patient and counsel on signs of toxicity.  []  Beta blockers: increased risk of bradycardia, AV block and myocardial depression. Sotalol - avoid concomitant use.  []   Calcium channel blockers (diltiazem and verapamil): increased risk of bradycardia, AV block and myocardial depression.  []   Cyclosporine: Amiodarone increases levels of cyclosporine. Reduced dose of cyclosporine is recommended.  []  Digoxin dose should be halved when amiodarone is started.  [x]  Diuretics: increased risk of cardiotoxicity if hypokalemia occurs.  []  Oral hypoglycemic agents (glyburide, glipizide, glimepiride): increased risk of hypoglycemia. Patient's glucose levels should be monitored closely when initiating amiodarone therapy.   []  Drugs that prolong the  QT interval:  Torsades de pointes risk may be increased with concurrent use - avoid if possible.  Monitor QTc, also keep magnesium/potassium WNL if concurrent therapy can't be avoided.  Antibiotics: e.g. fluoroquinolones, erythromycin.  Antiarrhythmics: e.g. quinidine, procainamide, disopyramide, sotalol.  Antipsychotics: e.g. phenothiazines, haloperidol.   Lithium, tricyclic antidepressants, and methadone. Thank You,  Marshia Ly  02/14/2021 3:27 PM

## 2021-02-14 NOTE — Progress Notes (Signed)
PROGRESS NOTE    JEWELS LANGONE  GTX:[id number] DOB: [birth year] DOA: 02/12/2021 PCP: [name], MD   Brief Narrative:   [name] [name] is an 88 y.o. female past medical history of diabetes mellitus type 2 chronic kidney disease stage II hyperlipidemia dementia comes into the ER with at least 2 falls at home, CT of the head was negative for intracranial pathology but it did show small laceration, her sodium was 129 creatinine 1.2 mag 1.4 hemoglobin of 12, she was admitted for weakness with acute kidney injury and hyponatremia. She is now having hypotension after multiple episodes of N/V overnight. She has not responded very well to fluid boluses and therefore, critical care has been contacted for further evaluation.  Assessment & Plan:   Principal Problem:   Fall at home, initial encounter Active Problems:   CKD stage 2 due to type 2 diabetes mellitus ([place]) - Baseline Scr 0.8-1.0   Hyperlipidemia associated with type 2 diabetes mellitus ([place])   Essential hypertension   Moderate dementia without behavioral disturbance   Type 2 diabetes mellitus (HCC)   AKI (acute kidney injury) ([place])   Hyponatremia   Right lower quadrant abdominal pain   Circulatory shock Unknown etiology, but perhaps related to some hypovolemia Leukocytosis noted; ordered lactic that is pending  CXR, UA, and blood cultures ordered and pending Did not respond to 2L IVF; discussed with PCCM and appreciate evaluation  Fall at home, initial encounter Follow-up with PCP in 7 to 10 days to remove staples. Continue Tylenol for pain. Physical therapy has evaluated the patient, will consult inpatient rehab for possible admission.   AKI (acute kidney injury) ([place]) In the setting of ACE inhibitor and antihypertensive medication with decreased oral intake as per son. ACE inhibitor was held she was fluid resuscitated her creatinine is improving. I have reviewed her previous basic metabolic panel and her creatinine is  improving. Basic metabolic panel is pending today.  CKD stage 2 due to type 2 diabetes mellitus (HCC) - Baseline Scr 0.8-1.0- (present on admission) Baseline Scr 0.8-1.0   Right lower quadrant abdominal pain-resolved CT scan of the abdomen and pelvis did not showed the appendix identified.  Showed no inflammatory findings Hip xray negative   Hyponatremia Likely hypovolemic Follow bmp IV NS today   Type 2 diabetes mellitus ([place]) stable   Hyperlipidemia associated with type 2 diabetes mellitus ([place])- (present on admission) Stable. On lipitor.   Essential hypertension-now hypotensive  Imdur DC 2/8   Moderate dementia without behavioral disturbance- (present on admission) Pt lives alone but has family that lives next door and across the street. Son states gas stove has been turned off and pt only has access to microwave. His intention is for patient to return to her home. She is on no meds, follow up with neurologist Likely with failure to thrive and needs palliative consult. Son adamant that she is a full code. Poor prognosis.   DVT prophylaxis:Lovenox Code Status: Full Family Communication: Son at bedside 2/8 Disposition Plan:  Status is: Inpatient Remains inpatient appropriate because: Unstable and requires IV meds.   Consultants:  PCCM  Procedures:  See below  Antimicrobials:  Anti-infectives (From admission, onward)    None      Subjective: Patient seen and evaluated today and is somnolent, but arousable to voice. She has had multiple episodes of nausea and vomiting overnight and is not taking anything orally. BP is now low.  Objective: Vitals:   02/14/21 1100 02/14/21 1137 02/14/21 1241 02/14/21  1300  BP: (!) 61/39 (!) 69/44 (!) 70/46 (!) 68/43  Pulse: 94 85  90  Resp: 16 16  16   Temp: 98.1 F (36.7 C)  97.9 F (36.6 C) 98.4 F (36.9 C)  TempSrc:   Oral Oral  SpO2: 94% 97% 98% 96%  Weight:      Height:        Intake/Output Summary (Last 24  hours) at 02/14/2021 1327 Last data filed at 02/14/2021 1126 Gross per 24 hour  Intake 1237.01 ml  Output 100 ml  Net 1137.01 ml   Filed Weights   02/28/2021 1641 02/12/21 1232  Weight: 46.7 kg 46.3 kg    Examination:  General exam: Appears somnolent, but arousable; dry oral mucosa Respiratory system: Clear to auscultation. Respiratory effort normal. Cardiovascular system: S1 & S2 heard, RRR.  Gastrointestinal system: Abdomen is soft Central nervous system: Arousable to voice Extremities: No edema Skin: No significant lesions noted Psychiatry: Flat affect.    Data Reviewed: I have personally reviewed following labs and imaging studies  CBC: Recent Labs  Lab 02/24/2021 1813 02/12/21 0207 02/13/21 0402 02/14/21 0730  WBC 6.1 7.7 7.1 14.2*  NEUTROABS 4.7 4.4 4.6 11.7*  HGB 12.0 9.9* 9.0* 10.0*  HCT 35.5* 28.5* 25.4* 28.6*  MCV 91.0 88.8 87.9 87.7  PLT PLATELET CLUMPS NOTED ON SMEAR, UNABLE TO ESTIMATE 127* 103* 540*   Basic Metabolic Panel: Recent Labs  Lab 02/21/2021 1915 02/12/21 0207 02/13/21 1110 02/14/21 0730  NA 129* 130* 134* 132*  K 4.1 3.5 3.5 3.8  CL 94* 98 104 98  CO2 15* 16* 20* 17*  GLUCOSE 139* 97 106* 106*  BUN 14 14 9 11   CREATININE 1.29* 1.16* 0.78 1.12*  CALCIUM 8.8* 8.2* 8.0* 8.2*  MG 1.4* 2.0  --   --    GFR: Estimated Creatinine Clearance: 25.9 mL/min (A) (by C-G formula based on SCr of 1.12 mg/dL (H)). Liver Function Tests: Recent Labs  Lab 02/10/2021 1915 02/12/21 0207  AST 28 23  ALT 27 21  ALKPHOS 31* 26*  BILITOT 1.7* 1.2  PROT 7.1 6.2*  ALBUMIN 4.0 3.4*   Recent Labs  Lab 02/07/2021 1915  LIPASE 65*   No results for input(s): AMMONIA in the last 168 hours. Coagulation Profile: No results for input(s): INR, PROTIME in the last 168 hours. Cardiac Enzymes: Recent Labs  Lab 02/26/2021 1915  CKTOTAL 176   BNP (last 3 results) No results for input(s): PROBNP in the last 8760 hours. HbA1C: No results for input(s): HGBA1C in the  last 72 hours. CBG: Recent Labs  Lab 02/13/21 1136 02/13/21 1605 02/13/21 2113 02/14/21 0734 02/14/21 1127  GLUCAP 108* 153* 207* 88 89   Lipid Profile: No results for input(s): CHOL, HDL, LDLCALC, TRIG, CHOLHDL, LDLDIRECT in the last 72 hours. Thyroid Function Tests: Recent Labs    02/22/2021 2138  TSH 0.437   Anemia Panel: No results for input(s): VITAMINB12, FOLATE, FERRITIN, TIBC, IRON, RETICCTPCT in the last 72 hours. Sepsis Labs: Recent Labs  Lab 03/01/2021 2138  LATICACIDVEN 1.2    Recent Results (from the past 240 hour(s))  Resp Panel by RT-PCR (Flu A&B, Covid) Nasopharyngeal Swab     Status: None   Collection Time: 03/04/2021  8:32 PM   Specimen: Nasopharyngeal Swab; Nasopharyngeal(NP) swabs in vial transport medium  Result Value Ref Range Status   SARS Coronavirus 2 by RT PCR NEGATIVE NEGATIVE Final    Comment: (NOTE) SARS-CoV-2 target nucleic acids are NOT DETECTED.  The SARS-CoV-2  RNA is generally detectable in upper respiratory specimens during the acute phase of infection. The lowest concentration of SARS-CoV-2 viral copies this assay can detect is 138 copies/mL. A negative result does not preclude SARS-Cov-2 infection and should not be used as the sole basis for treatment or other patient management decisions. A negative result may occur with  improper specimen collection/handling, submission of specimen other than nasopharyngeal swab, presence of viral mutation(s) within the areas targeted by this assay, and inadequate number of viral copies(<138 copies/mL). A negative result must be combined with clinical observations, patient history, and epidemiological information. The expected result is Negative.  Fact Sheet for Patients:  EntrepreneurPulse.com.au  Fact Sheet for Healthcare Providers:  IncredibleEmployment.be  This test is no t yet approved or cleared by the [place] FDA and  has been authorized for  detection and/or diagnosis of SARS-CoV-2 by FDA under an Emergency Use Authorization (EUA). This EUA will remain  in effect (meaning this test can be used) for the duration of the COVID-19 declaration under Section 564(b)(1) of the Act, 21 U.S.C.section 360bbb-3(b)(1), unless the authorization is terminated  or revoked sooner.       Influenza A by PCR NEGATIVE NEGATIVE Final   Influenza B by PCR NEGATIVE NEGATIVE Final    Comment: (NOTE) The Xpert Xpress SARS-CoV-2/FLU/RSV plus assay is intended as an aid in the diagnosis of influenza from Nasopharyngeal swab specimens and should not be used as a sole basis for treatment. Nasal washings and aspirates are unacceptable for Xpert Xpress SARS-CoV-2/FLU/RSV testing.  Fact Sheet for Patients: EntrepreneurPulse.com.au  Fact Sheet for Healthcare Providers: IncredibleEmployment.be  This test is not yet approved or cleared by the [place] FDA and has been authorized for detection and/or diagnosis of SARS-CoV-2 by FDA under an Emergency Use Authorization (EUA). This EUA will remain in effect (meaning this test can be used) for the duration of the COVID-19 declaration under Section 564(b)(1) of the Act, 21 U.S.C. section 360bbb-3(b)(1), unless the authorization is terminated or revoked.  Performed at [facility], [place] [address], [place], [place] [address]          Radiology Studies: DG Abd 1 View  Result Date: 02/14/2021 CLINICAL DATA:  88 year old female with history of nausea and vomiting. EXAM: ABDOMEN - 1 VIEW COMPARISON:  Abdominal radiograph 06/24/2018. FINDINGS: Gas and stool are seen scattered throughout the colon extending to the level of the distal rectum. No pathologic distension of small bowel is noted. No gross evidence of pneumoperitoneum. Multiple phleboliths are noted in the anatomic pelvis. IMPRESSION: 1. Nonobstructive bowel gas pattern. 2. No pneumoperitoneum.  Electronically Signed   By: Vinnie Langton M.D.   On: 02/14/2021 05:34   DG CHEST PORT 1 VIEW  Result Date: 02/14/2021 CLINICAL DATA:  Aspiration pneumonia.  Vomiting. EXAM: PORTABLE CHEST 1 VIEW COMPARISON:  Chest two views 02/27/2021 FINDINGS: Status post median sternotomy and CABG. Cardiac silhouette is again at the upper limits of normal size. Calcification is again seen within aortic arch. The lungs are clear. No pleural effusion or pneumothorax. No acute skeletal abnormality. IMPRESSION: No active pulmonary process. Electronically Signed   By: Yvonne Kendall M.D.   On: 02/14/2021 12:53   DG HIP UNILAT WITH PELVIS 2-3 VIEWS RIGHT  Result Date: 02/13/2021 CLINICAL DATA:  Fall, right hip pain EXAM: DG HIP (WITH OR WITHOUT PELVIS) 3V RIGHT COMPARISON:  None. FINDINGS: No evidence of hip fracture or dislocation. Mild degenerative changes of the bilateral hips and SI joints. Vascular calcifications.  Excreted contrast material noted in the bladder, likely related to prior CT with contrast. IMPRESSION: No evidence of right hip fracture or dislocation. Electronically Signed   By: Yetta Glassman M.D.   On: 02/13/2021 15:45        Scheduled Meds:  acetaminophen  1,000 mg Oral QID   aspirin EC  81 mg Oral Daily   atorvastatin  80 mg Oral Daily   enoxaparin (LOVENOX) injection  30 mg Subcutaneous Q24H   insulin aspart  0-5 Units Subcutaneous QHS   insulin aspart  0-9 Units Subcutaneous TID WC   mouth rinse  15 mL Mouth Rinse BID   mirtazapine  15 mg Oral QHS   nicotine  14 mg Transdermal Daily   Continuous Infusions:  sodium chloride 100 mL/hr at 02/14/21 1223   sodium chloride     norepinephrine (LEVOPHED) Adult infusion     sodium chloride       LOS: 2 days    Critical care time: 65 mins.    Toniette Devera Darleen Crocker, DO [facility]  If 7PM-7AM, please contact night-coverage [email or web address] 02/14/2021, 1:27 PM

## 2021-02-14 NOTE — Procedures (Signed)
Name:  [name] MRN:  [id number] DOB:  October 19, 1931  PROCEDURE NOTE  Procedure:  Ultrasound-guided central venous catheter placement.  Indications:  Need for intravenous access and hemodynamic monitoring.  Consent:  Consent was implied due to the emergency nature of the procedure.  Anesthesia:  A total of 10 mL of 1% Lidocaine was used for local infiltration anesthesia.  Procedure summary:  Appropriate equipment was assembled.  The patient was identified as [name] [name] and safety timeout was performed. The patient was placed in Trendelenburg position.  Sterile technique was used. The patient's left neck region was prepped using chlorhexidine / alcohol scrub and the field was draped in usual sterile fashion with full body drape. The left internal jugular vein and the  carotid artery were identified by ultrasound, the patency was evaluated and images were documented. After the adequate anesthesia was achieved, the vein was cannulated with the introducer needle under sonographic guidance without difficulty. A guide wire was advanced through the introducer needle, which was then withdrawn. A small skin incision was made at the point of wire entry, the dilator was inserted over the guide wire and appropriate dilation was obtained. The dilator was removed and 7 French triple-lumen catheter was advanced over the guide wire, which was then removed.  All ports were aspirated and flushed with normal saline without difficulty. The catheter was secured into place with sutures at 18 cm. Antibiotic patch was placed and sterile dressing was applied. Post-procedure chest x-ray was ordered.  Complications:  No immediate complications were noted.  Hemodynamic parameters and oxygenation remained stable throughout the procedure.  Estimated blood loss:  Less then 5 mL.   02/14/2021, 8:42 PM

## 2021-02-14 NOTE — Plan of Care (Signed)
  Problem: Activity: Goal: Risk for activity intolerance will decrease Outcome: Progressing   Problem: Nutrition: Goal: Adequate nutrition will be maintained Outcome: Progressing   Problem: Elimination: Goal: Will not experience complications related to bowel motility Outcome: Progressing   

## 2021-02-15 ENCOUNTER — Inpatient Hospital Stay (HOSPITAL_COMMUNITY): Payer: Medicare Other

## 2021-02-15 DIAGNOSIS — R4182 Altered mental status, unspecified: Secondary | ICD-10-CM

## 2021-02-15 DIAGNOSIS — R40243 Glasgow coma scale score 3-8, unspecified time: Secondary | ICD-10-CM

## 2021-02-15 LAB — CBC WITH DIFFERENTIAL/PLATELET
Abs Immature Granulocytes: 0.1 10*3/uL — ABNORMAL HIGH (ref 0.00–0.07)
Basophils Absolute: 0 10*3/uL (ref 0.0–0.1)
Basophils Relative: 0 %
Eosinophils Absolute: 0 10*3/uL (ref 0.0–0.5)
Eosinophils Relative: 0 %
HCT: 24.9 % — ABNORMAL LOW (ref 36.0–46.0)
Hemoglobin: 7 g/dL — ABNORMAL LOW (ref 12.0–15.0)
Lymphocytes Relative: 6 %
Lymphs Abs: 0.7 10*3/uL (ref 0.7–4.0)
MCH: 31.1 pg (ref 26.0–34.0)
MCHC: 28.1 g/dL — ABNORMAL LOW (ref 30.0–36.0)
MCV: 110.7 fL — ABNORMAL HIGH (ref 80.0–100.0)
Metamyelocytes Relative: 1 %
Monocytes Absolute: 0.9 10*3/uL (ref 0.1–1.0)
Monocytes Relative: 8 %
Neutro Abs: 10 10*3/uL — ABNORMAL HIGH (ref 1.7–7.7)
Neutrophils Relative %: 85 %
Platelets: 93 10*3/uL — ABNORMAL LOW (ref 150–400)
RBC: 2.25 MIL/uL — ABNORMAL LOW (ref 3.87–5.11)
RDW: 14.5 % (ref 11.5–15.5)
Smear Review: DECREASED
WBC: 11.8 10*3/uL — ABNORMAL HIGH (ref 4.0–10.5)
nRBC: 0 % (ref 0.0–0.2)

## 2021-02-15 LAB — GLUCOSE, CAPILLARY
Glucose-Capillary: 156 mg/dL — ABNORMAL HIGH (ref 70–99)
Glucose-Capillary: 202 mg/dL — ABNORMAL HIGH (ref 70–99)

## 2021-02-15 LAB — POCT I-STAT 7, (LYTES, BLD GAS, ICA,H+H)
Acid-base deficit: 26 mmol/L — ABNORMAL HIGH (ref 0.0–2.0)
Bicarbonate: 4.6 mmol/L — ABNORMAL LOW (ref 20.0–28.0)
Calcium, Ion: 0.96 mmol/L — ABNORMAL LOW (ref 1.15–1.40)
HCT: 18 % — ABNORMAL LOW (ref 36.0–46.0)
Hemoglobin: 6.1 g/dL — CL (ref 12.0–15.0)
O2 Saturation: 98 %
Patient temperature: 35.4
Potassium: 7.3 mmol/L (ref 3.5–5.1)
Sodium: 126 mmol/L — ABNORMAL LOW (ref 135–145)
TCO2: 5 mmol/L — ABNORMAL LOW (ref 22–32)
pCO2 arterial: 20.4 mmHg — ABNORMAL LOW (ref 32.0–48.0)
pH, Arterial: 6.949 — CL (ref 7.350–7.450)
pO2, Arterial: 156 mmHg — ABNORMAL HIGH (ref 83.0–108.0)

## 2021-02-15 LAB — HEPARIN LEVEL (UNFRACTIONATED): Heparin Unfractionated: <0.1 IU/mL — ABNORMAL LOW (ref 0.30–0.70)

## 2021-02-15 LAB — COMPREHENSIVE METABOLIC PANEL
ALT: 5126 U/L — ABNORMAL HIGH (ref 0–44)
AST: 7869 U/L — ABNORMAL HIGH (ref 15–41)
Albumin: 2.2 g/dL — ABNORMAL LOW (ref 3.5–5.0)
Alkaline Phosphatase: 61 U/L (ref 38–126)
BUN: 18 mg/dL (ref 8–23)
CO2: <7 mmol/L — ABNORMAL LOW (ref 22–32)
Calcium: 7.3 mg/dL — ABNORMAL LOW (ref 8.9–10.3)
Chloride: 97 mmol/L — ABNORMAL LOW (ref 98–111)
Creatinine, Ser: 2.3 mg/dL — ABNORMAL HIGH (ref 0.44–1.00)
GFR, Estimated: 20 mL/min — ABNORMAL LOW (ref >60)
Glucose, Bld: 213 mg/dL — ABNORMAL HIGH (ref 70–99)
Potassium: >7.5 mmol/L (ref 3.5–5.1)
Sodium: 129 mmol/L — ABNORMAL LOW (ref 135–145)
Total Bilirubin: 2.8 mg/dL — ABNORMAL HIGH (ref 0.3–1.2)
Total Protein: 4.4 g/dL — ABNORMAL LOW (ref 6.5–8.1)

## 2021-02-15 LAB — PROCALCITONIN: Procalcitonin: 9.38 ng/mL

## 2021-02-15 MED ORDER — IOHEXOL 350 MG/ML SOLN
100.0000 mL | Freq: Once | INTRAVENOUS | Status: AC | PRN
  Administered 2021-02-15: 60 mL via INTRAVENOUS

## 2021-02-15 MED ORDER — SODIUM BICARBONATE 8.4 % IV SOLN
INTRAVENOUS | Status: AC
  Filled 2021-02-15: qty 250

## 2021-02-15 MED ORDER — CALCIUM CHLORIDE 10 % IV SOLN
INTRAVENOUS | Status: AC
  Filled 2021-02-15: qty 10

## 2021-02-15 MED ORDER — SODIUM ZIRCONIUM CYCLOSILICATE 10 G PO PACK
PACK | ORAL | Status: AC
  Filled 2021-02-15: qty 1

## 2021-02-15 MED ORDER — ALBUTEROL SULFATE (2.5 MG/3ML) 0.083% IN NEBU
INHALATION_SOLUTION | RESPIRATORY_TRACT | Status: AC
  Filled 2021-02-15: qty 12

## 2021-02-15 MED ORDER — DEXTROSE 50 % IV SOLN
INTRAVENOUS | Status: AC
  Filled 2021-02-15: qty 50

## 2021-02-15 MED ORDER — INSULIN ASPART 100 UNIT/ML IJ SOLN
0.0000 [IU] | INTRAMUSCULAR | Status: DC
  Administered 2021-02-15: 1 [IU] via SUBCUTANEOUS

## 2021-02-15 MED ORDER — NOREPINEPHRINE 16 MG/250ML-% IV SOLN
INTRAVENOUS | Status: AC
  Filled 2021-02-15: qty 250

## 2021-02-19 LAB — CULTURE, BLOOD (ROUTINE X 2)
Culture: NO GROWTH
Culture: NO GROWTH
Special Requests: ADEQUATE
Special Requests: ADEQUATE

## 2021-03-07 NOTE — Procedures (Signed)
Cardiopulmonary Resuscitation Note  [name] [name]  [id number]  August 07, 1931  Date:02-17-21  Time:6:44 AM   Provider Performing:Britny Riel Jerilynn Mages Ayesha Rumpf   Procedure: Cardiopulmonary Resuscitation [phone])  Indication(s) Loss of Pulse  Consent N/A  Anesthesia N/A  Time Out N/A  Sterile Technique Hand hygiene, gloves  Procedure Description Called to patient's room for CODE BLUE. Initial rhythm was PEA/Asystole. Patient received high quality chest compressions for approximately 13 minutes with defibrillation or cardioversion when appropriate. Epinephrine was administered every 3 minutes as directed by time Therapist, nutritional. Additional pharmacologic interventions included calcium chloride, sodium bicarbonate, and vasopressin. Return of spontaneous circulation was achieved. Unfortunately, shortly after ROSC, patient's heart rhythm once again deteriorated into asystole and CPR was re-initiated. At that point, discussed with son via phone (see note) who stated that if we could not get patient back that he would want her to be let go peacefully. Two additional rounds of CPR were completed without ROSC and code was called.  Family called and notified. En route to hospital at time of note.   Complications/Tolerance N/A   EBL N/A   Specimen(s) N/A  Estimated time to ROSC: 13 minutes, ~6 additional minutes  Lestine Mount, [facility] 02-17-21 6:49 AM  Please see [email or web address] for pager details.  From 7A-7P if no response, please call [phone] After hours, please call ELink [phone]

## 2021-03-07 NOTE — Progress Notes (Signed)
[facility] Progress Note Patient Name: LAKEESHA FONTANILLA DOB: 1931-05-28 MRN: [id number]   Date of Service  2021/02/21  HPI/Events of Note  Patient with K+ > 7.0. Patient subsequently went into cardiac arrest (see code blue documentation).  eICU Interventions  Full hyperkalemia treatment protocol ordered. ACLS protocol initiated for cardiac arrest, PCCM ground crew took over the code blue management.        Kerry Kass Anistyn Graddy 02/21/2021, 6:27 AM

## 2021-03-07 NOTE — Progress Notes (Signed)
Patient cardiac arrested x2. See code blue documentation. MD at bedside notified patients son. After failed resuscitation patient expired. Pupils fixed and dilated, without spontaneous respirations, no activity on the ECG monitoring, without heart tones or pulse. Time of death 0614. She was pronounced by Dr. Carren Rang at bedside.   Santiago Bur RN

## 2021-03-07 NOTE — Progress Notes (Addendum)
[facility] Progress Note Patient Name: WILHELMENA ZEA DOB: April 21, 1931 MRN: [id number]   Date of Service  2021/02/16  HPI/Events of Note  Patient with an abrupt change in her neurologic exam.  eICU Interventions  Code stroke called. PCCM  Ground crew also asked to evaluate patient at bedside. ABG ordered, arterial line ordered.        Herman Mell U Derrius Furtick Feb 16, 2021, 1:15 AM

## 2021-03-07 NOTE — Progress Notes (Signed)
[id number]  Notified Elink MD that patient unresponsive, hypotensive and bradycardic at 2000. Precedex and Amiodarone on hold per MD. Levophed increased with stat ABG. Pt was acidotic. Tx per MD. Pt continued to decline. She had an abrupt change in her neurologic exam. Elink MD notified. Code stroke called. PCCM Ground crew also asked to evaluate patient at bedside. CT, CTA head and neck, MRI. Patient is not a candidate for IV Thrombolytic due to systemic anticoagulation. Patient is not a candidate for IR due to No LVO, and pt was returned to 3M06.  Shortly after pt became profoundly hypotensive and Elink MD notified. STAT labs were sent. She received 4 amps of bicarb, 1 amp CaCl. Additional lab work showed hyperkalemia. She received IV insulin, D50, lokelma per MD verbal MD order. Patient ultimately cardiac arrested x 2. See Code blue documentations. Patient expired at [phone].   Santiago Bur RN

## 2021-03-07 NOTE — Progress Notes (Signed)
°   02-28-21 0719  Clinical Encounter Type  Visited With Other (Comment)  Visit Type Code  Referral From Nurse  Consult/Referral To Chaplain  Spiritual Encounters  Spiritual Needs Other (Comment) (Patient had coded and staff were working to revive her.)   Chaplain responded to [employer] and patient was successfully revived. Nurse indicated that family had been contacted and Chaplain advised to called if/when they arrived. As noted in chart, patient coded again and passed away.

## 2021-03-07 NOTE — Consult Note (Signed)
[facility] Consult Note    Date of Service:  Mar 12, 2021       ------------------------------------------------------------------------------   History of Present Illness: 88 year old female with extensive PMHx as noted below who presented to the hospital on 2/5 after having at least 2 falls at home. Head CT was negative for acute intracranial pathology. A posterior scalp laceration sustained in the fall wea repaired with staples. She was diagnosed with diffuse weakness, AK and hyponatremia. She was initially admitted to the hospitalist service/ On /Wednesday she began to have hypotension after multiple episodes of N/V overnight. She did not respond well to fluid boluses and CCM was contacted for transfer to the ICU. Later on Wednesday she became severely agitated. TTE showed findings suggestive of possible PE. On Wednesday evening, at about 8:00 PM, the patient continued to deteriorate, becoming unresponsive, hypotensive and bradycardic; precede and amiodarone were paused, fluid bolus given and pressor titrated up; she improved somewhat after being bagged, suggestive of hypercapnia as a contributing factor for he obtundation. She was then intubated. Tonight at 1:15 AM, about 2 hours after receiving a bicarb bolus and starting bicarb gtt,for acidosis noted on ABG, she was noted to become acutely unresponsive with fixed and dilated pupils. Code Stroke was called.      Past Medical History: Anemia     Anginal pain ([place])     Cataract     Colon polyp 2009  TUBULAR ADENOMA   Coronary artery disease     Depression     Diabetes ([place])     Diverticulosis of colon (without mention of hemorrhage) 2009    Esophagitis 2009    Gastritis 2009    GERD (gastroesophageal reflux disease) 2009    Hiatal hernia 2009    Hyperlipemia     Hypertension     Thyroid disease     Dementia    Past Surgical History: Breast biopsy Cardiac catheterization Colonoscopy CABG Dilatation and curettage of  uterus     Medications:  Tylenol Amiodarone ASA Lipitor Atropine Calcium gluconate Cefepime Peridex Precedex Colace Dopamine Fentanyl Lasix Heparin infusion Insulin Melatonin Remeron MVI Nicoderm Levophed Zofran Protonix Miralax Sodium bicarbonate Vancomycin  Vasopressin      Social History: No EtOH, tobacco or drug use   Family History:  Reviewed in Epic   ROS: Unable to obtain due to AMS.       Examination:   HEENT: [place]/AT Lungs: Intubated Ext: No edema   Neurological exam: Performed after low-dose Precedex has been held Ment: No responses to any commands or questions. No attempts to communicate. No spontaneous movement. No posturing or any other movement to stimulation. Eyes are closed initially. No eye opening to any stimuli, including simultaneous noxious stimuli applied at separate locations. Eyelids remain partially open after passive elevation by examiner. Eyes remain closed after they are manually shut.  CN: Pupils are enlarged, slightly asymmetric and slightly irregular at 6-7 mm bilaterally. No reaction to penlight bilaterally. No hippus seen. Corneal reflexes absent. Eyes are conjugate at the midline with no oculocephalic reflex. No cough or gag reflex.  Motor/Sensory: Flaccid tone in all 4 extremities. No movement to any stimuli, all 4 extremities. No posturing or other spontaneous movement.  Reflexes: Hypoactive x 4 Cerebellar/Gait: Unable to assess     Assessment: Acute onset of unresponsiveness in an 88 year old critically ill female with multiple comorbidities  Exam reveals no brainstem reflexes, with no evidence for any awareness of her surroundings and no movement of her extremities  to any noxious stimuli Overall findings appeared most consistent with an acute brainstem lesion such as hemorrhage or stroke.  CT head did not reveal an etiology, with no acute abnormality seen MRI reveals no acute stroke or other brainstem or cerebral  hemispheric acute pathology No LVO on CTA of head and neck DDx includes anoxic brain injuiry, severe toxic/metabolic encephalopathy, versus lingering effect of recent paralytic and atropine administration versus MRI-negative stroke. No jerking or twitching to suggest seizures, which also would not present with complete loss of brainstem reflexes.      Recommendations: - EEG - Frequent neuro checks - Repeat ABG - Discussed with CCM.      Patient is being evaluated for possible acute neurologic impairment and high pretest probability of imminent or life-threatening deterioration. I spent total of 60 minutes providing care to this patient, including bedside exam, review of imaging, review of medical records, and discussion of findings with providers.   Electronically signed: Dr. Kerney Elbe

## 2021-03-07 NOTE — Code Documentation (Signed)
Stroke Response Nurse Documentation Code Documentation  [name] [name] is a 88 y.o. female admitted to [facility] on 2/5 with past medical hx of HLD, CKD, HTN. On heparin IV. Code stroke was activated by Nursing staff.   On 2/9 where she was LKW at 2200 2/8 and now complaining of unresponsiveness and dilated pupils.  Stroke team at the bedside. Labs drawn and patient cleared for CT by [place] Crane PA PCCM. Patient to CT with team. NIHSS 35, see documentation for details and code stroke times. Patient with decreased LOC, disoriented, not following commands, bilateral hemianopia, right facial droop, bilateral arm weakness, bilateral leg weakness, bilateral decreased sensation, Global aphasia , dysarthria , and Sensory  neglect on exam. The following imaging was completed:  CT, CTA head and neck, MRI. Patient is not a candidate for IV Thrombolytic due to systemic anticoagulation. Patient is not a candidate for IR due to No LVO.    Bedside handoff with RN Hailey.    Madelynn Done  Rapid Response RN

## 2021-03-07 NOTE — Progress Notes (Signed)
PCCM Interval Progress Note  Contacted patient's son, [name], to update him on Kadesha's clinical course throughout the night.  Just prior to [facility] downtime around 0100, patient experienced acute change in neurologic status with bilateral fixed and dilated pupils and absence of brainstem reflexes.  Code stroke was called and stat CT head was completed and negative for acute intracranial abnormalities.  Given patient's ongoing concerning neurologic status, MRI was completed and negative.  Despite negative brain imaging, patient's neurologic status remained poor and further investigation was warranted.  Labs demonstrated K > 7 and she was appropriately shifted.  Blood gas demonstrated acidosis and low bicarbonate prompting multiple pushes.  Pressors were escalated and maxed to maintain goal MAP.  Despite all of this, patient became asystolic and CPR was initiated.  (Please see separately documented CPR note.)  ROSC was achieved but unfortunately was very brief and patient deteriorated.  A second code was called and at this point I spoke to patient's son, who stated that he and his mother had talked about her wishes previously and that she would not want ongoing resuscitation if she could not return to her prior status.  Two additional rounds of CPR were completed without ROSC and the code was called at that point.  [name] was updated via phone and was en route to the hospital at the time of this note.  Lestine Mount, PA-C [facility] 02-25-21 6:43 AM  Please see [email or web address] for pager details.  From 7A-7P if no response, please call [phone] After hours, please call ELink [phone]

## 2021-03-07 NOTE — Death Summary Note (Signed)
DEATH SUMMARY   Patient Details  Name: [name] [name] MRN: [id number] DOB: 25-Apr-1931  Admission/Discharge Information   Admit Date:  Feb 22, 2021  Date of Death: Date of Death: 02-26-2021  Time of Death: Time of Death: 0614  Length of Stay: 3  Referring Physician: [name], MD   Reason(s) for Hospitalization  Shock, combination of septic/ cardiogenic Paroxysmal Afib with RVR Acute systolic biventricular congestive heart failure Severe lactic acidosis Hyperkalemia Acute hypoxic respiratory failure Acute metabolic/septic encephalopathy S/p fall at home Diabetes type 2 Acute kidney injury Hyponatremia Anemia of critical illness Severe protein calorie malnutrition  Diagnoses  Preliminary cause of death:   Septic shock with high anion gap metabolic acidosis and multiorgan failure Secondary Diagnoses (including complications and co-morbidities):  Principal Problem:   Fall at home, initial encounter Active Problems:   CKD stage 2 due to type 2 diabetes mellitus ([place]) - Baseline Scr 0.8-1.0   Hyperlipidemia associated with type 2 diabetes mellitus (HCC)   Essential hypertension   Moderate dementia without behavioral disturbance   Type 2 diabetes mellitus (HCC)   AKI (acute kidney injury) ([place])   Hyponatremia   Right lower quadrant abdominal pain   Septic shock (HCC)   Protein-calorie malnutrition, severe   Brief Hospital Course (including significant findings, care, treatment, and services provided and events leading to death)  [name] [name] is a 88 y.o. year old female  with prior history significant for dementia, CKDII, CAD, DMT2, HTN, and anemia admitted to [facility] on 2/5 for AKI and hypovolemic hyponatremia.   Patient apparently lives alone but has family next door.  She presented after having several falls at home with a posterior scalp laceration on 2/5.  Family reported poor PO intake at home.      Workup was negative for CTH and CT cervical, but labs showed Na 129  and sCr 1.29.  Cardiac workup benign and UA neg.  She was started on IV fluids with improving renal function.  She had complained of some right lower abdominal pain, therefore a CT abd/ pelvis was completed which did not show any acute findings.     Overnight on 2/7 she had some nausea and vomiting with dark brown emesis and noted to be hypotensive into the 60's.  She was given 2L NS but remains hypotensive.  Urine and blood cultures sent.  She has been afebrile and on room air.   WBC up 7.1> 14.2, Hgb stable, and sCr 0.78> 1.12.  TRH discussed goals of care with son and patient remains a full code.  PCCM consulted for further evaluation.   Patient was noted to be in shock, she was transferred to ICU and was started on vasopressors, work-up was sent echocardiogram showed EF of 40 to 45% with new wall motion abnormalities, lactic acid level came back more than 9 and procalcitonin was over 20, there was combination of septic and cardiogenic shock, patient remain agitated was started on IV Precedex infusion because of acidosis she became bradycardic, received 1 dose of atropine, 2 A of bicarbonate followed by she was started on IV bicarbonate infusion.  She was intubated and was placed on mechanical ventilation, IV vasopressors were continued and central line was placed.  She was on broad-spectrum antibiotics, she had acute neurological change but brain imaging remain negative which ruled out acute stroke.  Despite extensive medical management patient continued to deteriorate and she went into cardiac arrest, CPR was done per ACLS protocol several times with ROSC achieved but she kept  ongoing into PEA cardiac arrest.  Unfortunately prior timing she went into PEA cardiac arrest, ROSC was not achieved and she was declared dead.  Patient's family was notified  Pertinent Labs and Studies  Significant Diagnostic Studies DG Chest 2 View  Result Date: 02/27/2021 CLINICAL DATA:  Cough EXAM: CHEST - 2 VIEW COMPARISON:   Chest x-ray dated 12/19/2015 FINDINGS: Heart size and mediastinal contours are within normal limits. Median sternotomy wires appear intact and stable in alignment. Lungs are clear. No pleural effusion or pneumothorax is seen. No acute-appearing osseous abnormality. IMPRESSION: No active cardiopulmonary disease. No evidence of pneumonia or pulmonary edema. Electronically Signed   By: Franki Cabot M.D.   On: 03/03/2021 18:02   DG Abd 1 View  Result Date: 02/14/2021 CLINICAL DATA:  88 year old female with history of nausea and vomiting. EXAM: ABDOMEN - 1 VIEW COMPARISON:  Abdominal radiograph 06/24/2018. FINDINGS: Gas and stool are seen scattered throughout the colon extending to the level of the distal rectum. No pathologic distension of small bowel is noted. No gross evidence of pneumoperitoneum. Multiple phleboliths are noted in the anatomic pelvis. IMPRESSION: 1. Nonobstructive bowel gas pattern. 2. No pneumoperitoneum. Electronically Signed   By: Vinnie Langton M.D.   On: 02/14/2021 05:34   CT Head Wo Contrast  Result Date: 02/22/2021 CLINICAL DATA:  Head trauma, moderate to severe. Fall. Lacerations to the back of the head. No blood thinners. EXAM: CT HEAD WITHOUT CONTRAST TECHNIQUE: Contiguous axial images were obtained from the base of the skull through the vertex without intravenous contrast. RADIATION DOSE REDUCTION: This exam was performed according to the departmental dose-optimization program which includes automated exposure control, adjustment of the mA and/or kV according to patient size and/or use of iterative reconstruction technique. COMPARISON:  Maxillofacial CT 06/25/2018 FINDINGS: Brain: Diffuse cerebral atrophy. Ventricular dilatation consistent with central atrophy. Low-attenuation changes in the deep white matter consistent with small vessel ischemia. No abnormal extra-axial fluid collections. No mass effect or midline shift. Gray-white matter junctions are distinct. Basal cisterns are  not effaced. No acute intracranial hemorrhage. Vascular: Extensive intracranial arterial vascular calcifications. Skull: Calvarium appears intact. Subcutaneous scalp hematoma over the left posterior occipital region. Sinuses/Orbits: Paranasal sinuses and mastoid air cells are clear. Other: None. IMPRESSION: No acute intracranial abnormalities. Chronic atrophy and small vessel ischemic changes. Electronically Signed   By: Lucienne Capers M.D.   On: 02/23/2021 17:44   CT Cervical Spine Wo Contrast  Result Date: 03/03/2021 CLINICAL DATA:  Neck trauma due to a fall. EXAM: CT CERVICAL SPINE WITHOUT CONTRAST TECHNIQUE: Multidetector CT imaging of the cervical spine was performed without intravenous contrast. Multiplanar CT image reconstructions were also generated. RADIATION DOSE REDUCTION: This exam was performed according to the departmental dose-optimization program which includes automated exposure control, adjustment of the mA and/or kV according to patient size and/or use of iterative reconstruction technique. COMPARISON:  None. FINDINGS: Alignment: Normal alignment of the cervical vertebrae and posterior elements. C1-2 articulation appears intact. Skull base and vertebrae: Skull base appears intact. No vertebral compression deformities. No focal bone lesion or bone destruction. Bone cortex appears intact. Soft tissues and spinal canal: No prevertebral soft tissue swelling. No abnormal paraspinal soft tissue mass or infiltration. Prominent vascular calcifications in the cervical carotid arteries. Disc levels: Diffuse degenerative changes with narrowed interspaces and endplate osteophyte formation throughout. Degenerative changes in the facet joints. Uncovertebral spurring at multiple levels causes encroachment upon the neural foramina. Upper chest: Visualized lung apices are clear. Other: None. IMPRESSION: 1. Normal alignment.  No acute displaced fractures. 2. Diffuse degenerative change throughout the cervical  spine. 3. Extensive vascular calcifications. Electronically Signed   By: Lucienne Capers M.D.   On: 02/22/2021 17:46   MR BRAIN WO CONTRAST  Result Date: Feb 26, 2021 CLINICAL DATA:  Stroke symptoms EXAM: MRI HEAD WITHOUT CONTRAST TECHNIQUE: Multiplanar, multiecho pulse sequences of the brain and surrounding structures were obtained without intravenous contrast. COMPARISON:  Head CT and CTA from earlier the same day. FINDINGS: Brain: No acute infarction, hemorrhage, hydrocephalus, extra-axial collection or mass lesion. Left parietal gliosis/encephalomalacia with sulcal widening. Chronic small vessel ischemia and cerebral volume loss in keeping with age. Few remote hemorrhagic foci with nonspecific pattern. Vascular: Preceding CTA.  Preserved flow voids Skull and upper cervical spine: Normal marrow signal Sinuses/Orbits: Bilateral cataract resection IMPRESSION: No acute finding such as infarct. Aging brain. Electronically Signed   By: Jorje Guild M.D.   On: 02-26-2021 07:37   CT ABDOMEN PELVIS W CONTRAST  Result Date: 02/12/2021 CLINICAL DATA:  Right lower quadrant abdominal pain. EXAM: CT ABDOMEN AND PELVIS WITH CONTRAST TECHNIQUE: Multidetector CT imaging of the abdomen and pelvis was performed using the standard protocol following bolus administration of intravenous contrast. RADIATION DOSE REDUCTION: This exam was performed according to the departmental dose-optimization program which includes automated exposure control, adjustment of the mA and/or kV according to patient size and/or use of iterative reconstruction technique. CONTRAST:  36mL OMNIPAQUE IOHEXOL 300 MG/ML  SOLN COMPARISON:  None. FINDINGS: Lower chest: Mild patchy volume loss in the right lower lobe. Dependent subpleural atelectasis in the left lower lobe. Heart size normal. No pericardial or pleural effusion. Distal esophagus is grossly unremarkable. Small hiatal hernia. Hepatobiliary: Liver is slightly decreased in attenuation diffusely  with sparing along the gallbladder fossa. Liver and gallbladder are otherwise unremarkable. No biliary ductal dilatation. Pancreas: Negative. Spleen: Negative. Adrenals/Urinary Tract: Adrenal glands and right kidney are unremarkable. 10 mm fluid density cyst in the interpolar left kidney. Right kidney is otherwise unremarkable. Ureters are decompressed. Bladder is grossly unremarkable. Stomach/Bowel: Small hiatal hernia. Stomach, small bowel and colon are unremarkable. Appendix is not readily visualized but there are no inflammatory changes in the right lower quadrant to suspect appendicitis. Vascular/Lymphatic: Atherosclerotic calcification of the aorta. No pathologically enlarged lymph nodes. Reproductive: Uterus is visualized. Probable calcified uterine fibroids. No adnexal mass. Other: No free fluid.  Mesenteries and peritoneum are unremarkable. Musculoskeletal: Degenerative changes in the spine.  Osteopenia. IMPRESSION: 1. Appendix is not readily identified but there are no inflammatory findings in the right lower quadrant to suggest appendicitis. No findings to explain the patient's right lower quadrant. 2. Hepatic steatosis. 3.  Aortic atherosclerosis (ICD10-I70.0). Electronically Signed   By: Lorin Picket M.D.   On: 02/12/2021 12:49   DG Chest Port 1 View  Result Date: 02/14/2021 CLINICAL DATA:  Endotracheal tube and central line placements EXAM: PORTABLE CHEST 1 VIEW COMPARISON:  02/14/2021 FINDINGS: Postoperative changes in the mediastinum. An endotracheal tube has been placed with tip measuring 4.5 cm above the carina. An enteric tube was placed with tip off the field of view but below the left hemidiaphragm. A left central venous catheter was placed with tip over the cavoatrial junction region. No pneumothorax. Shallow inspiration. Heart size is normal. Lungs are clear. No pleural effusions. Degenerative changes in the spine. Calcification of the aorta. IMPRESSION: Appliances appear in satisfactory  position. Electronically Signed   By: Lucienne Capers M.D.   On: 02/14/2021 21:13   DG CHEST PORT 1 VIEW  Result  Date: 02/14/2021 CLINICAL DATA:  Aspiration pneumonia.  Vomiting. EXAM: PORTABLE CHEST 1 VIEW COMPARISON:  Chest two views 03/04/2021 FINDINGS: Status post median sternotomy and CABG. Cardiac silhouette is again at the upper limits of normal size. Calcification is again seen within aortic arch. The lungs are clear. No pleural effusion or pneumothorax. No acute skeletal abnormality. IMPRESSION: No active pulmonary process. Electronically Signed   By: Yvonne Kendall M.D.   On: 02/14/2021 12:53   ECHOCARDIOGRAM COMPLETE  Result Date: 02/14/2021    ECHOCARDIOGRAM REPORT   Patient Name:   [name] [name] Date of Exam: 02/14/2021 Medical Rec #:  [id number]      Height:       61.0 in Accession #:    [id number]     Weight:       109.6 lb Date of Birth:  February 26, 1931      BSA:          1.463 m Patient Age:    40 years       BP:           81/42 mmHg Patient Gender: F              HR:           78 bpm. Exam Location:  Inpatient Procedure: 2D Echo, Cardiac Doppler, Color Doppler and Intracardiac            Opacification Agent STAT ECHO Indications:    Shock  History:        Patient has prior history of Echocardiogram examinations. Risk                 Factors:Diabetes.  Sonographer:    Jyl Heinz Referring Phys: [id number] Dougherty D Seth Ward  1. Left ventricular ejection fraction, by estimation, is 40 to 45%. The left ventricle has mildly decreased function. The left ventricle demonstrates global hypokinesis. There is mild left ventricular hypertrophy. Left ventricular diastolic parameters are indeterminate.  2. Right ventricular systolic function is moderately reduced. The right ventricular size is not well visualized. There is normal pulmonary artery systolic pressure.  3. No evidence of mitral valve regurgitation.  4. The aortic valve was not well visualized. Aortic valve regurgitation is trivial.  5. The  inferior vena cava is normal in size with <50% respiratory variability, suggesting right atrial pressure of 8 mmHg. Conclusion(s)/Recommendation(s): EF more reduced compared to prior echo 06/26/2018. FINDINGS  Left Ventricle: Left ventricular ejection fraction, by estimation, is 40 to 45%. The left ventricle has mildly decreased function. The left ventricle demonstrates global hypokinesis. The left ventricular internal cavity size was normal in size. There is  mild left ventricular hypertrophy. Left ventricular diastolic parameters are indeterminate. Right Ventricle: The right ventricular size is not well visualized. Right vetricular wall thickness was not well visualized. Right ventricular systolic function is moderately reduced. There is normal pulmonary artery systolic pressure. The tricuspid regurgitant velocity is 1.91 m/s, and with an assumed right atrial pressure of 8 mmHg, the estimated right ventricular systolic pressure is 09.6 mmHg. Left Atrium: Left atrial size was normal in size. Right Atrium: Right atrial size was normal in size. Pericardium: There is no evidence of pericardial effusion. Mitral Valve: No evidence of mitral valve regurgitation. Tricuspid Valve: The tricuspid valve is grossly normal. Tricuspid valve regurgitation is mild. Aortic Valve: The aortic valve was not well visualized. Aortic valve regurgitation is trivial. Aortic regurgitation PHT measures 309 msec. Aortic valve mean gradient measures 10.0 mmHg. Aortic valve peak gradient measures  17.0 mmHg. Aortic valve area, by  VTI measures 0.77 cm. Pulmonic Valve: The pulmonic valve was grossly normal. Pulmonic valve regurgitation is not visualized. Aorta: The aortic root and ascending aorta are structurally normal, with no evidence of dilitation. Venous: The inferior vena cava is normal in size with less than 50% respiratory variability, suggesting right atrial pressure of 8 mmHg. IAS/Shunts: No atrial level shunt detected by color flow  Doppler.  LEFT VENTRICLE PLAX 2D LVIDd:         3.10 cm     Diastology LVIDs:         2.60 cm     LV e' medial:    4.79 cm/s LV PW:         1.00 cm     LV E/e' medial:  21.9 LV IVS:        1.10 cm     LV e' lateral:   8.05 cm/s LVOT diam:     2.00 cm     LV E/e' lateral: 13.0 LV SV:         28 LV SV Index:   19 LVOT Area:     3.14 cm  LV Volumes (MOD) LV vol d, MOD A2C: 70.3 ml LV vol d, MOD A4C: 46.1 ml LV vol s, MOD A2C: 41.5 ml LV vol s, MOD A4C: 28.5 ml LV SV MOD A2C:     28.8 ml LV SV MOD A4C:     46.1 ml LV SV MOD BP:      22.4 ml RIGHT VENTRICLE            IVC RV Basal diam:  2.80 cm    IVC diam: 1.90 cm RV Mid diam:    2.40 cm RV S prime:     2.93 cm/s TAPSE (M-mode): 0.6 cm LEFT ATRIUM             Index        RIGHT ATRIUM          Index LA diam:        3.40 cm 2.32 cm/m   RA Area:     9.96 cm LA Vol (A2C):   54.6 ml 37.33 ml/m  RA Volume:   19.50 ml 13.33 ml/m LA Vol (A4C):   40.2 ml 27.49 ml/m LA Biplane Vol: 49.9 ml 34.12 ml/m  AORTIC VALVE AV Area (Vmax):    0.92 cm AV Area (Vmean):   0.87 cm AV Area (VTI):     0.77 cm AV Vmax:           206.00 cm/s AV Vmean:          155.500 cm/s AV VTI:            0.358 m AV Peak Grad:      17.0 mmHg AV Mean Grad:      10.0 mmHg LVOT Vmax:         60.40 cm/s LVOT Vmean:        43.100 cm/s LVOT VTI:          0.088 m LVOT/AV VTI ratio: 0.25 AI PHT:            309 msec  AORTA Ao Root diam: 3.10 cm Ao Asc diam:  3.00 cm MITRAL VALVE                TRICUSPID VALVE MV Area (PHT): 4.01 cm     TR Peak grad:   14.6 mmHg MV Decel Time: 189 msec  TR Vmax:        191.00 cm/s MV E velocity: 105.00 cm/s                             SHUNTS                             Systemic VTI:  0.09 m                             Systemic Diam: 2.00 cm Phineas Inches Electronically signed by Phineas Inches Signature Date/Time: 02/14/2021/5:51:02 PM    Final    DG HIP UNILAT WITH PELVIS 2-3 VIEWS RIGHT  Result Date: 02/13/2021 CLINICAL DATA:  Fall, right hip pain EXAM: DG HIP (WITH OR  WITHOUT PELVIS) 3V RIGHT COMPARISON:  None. FINDINGS: No evidence of hip fracture or dislocation. Mild degenerative changes of the bilateral hips and SI joints. Vascular calcifications. Excreted contrast material noted in the bladder, likely related to prior CT with contrast. IMPRESSION: No evidence of right hip fracture or dislocation. Electronically Signed   By: Yetta Glassman M.D.   On: 02/13/2021 15:45   CT HEAD CODE STROKE WO CONTRAST  Result Date: Feb 22, 2021 CLINICAL DATA:  Code stroke. Initial evaluation for acute neuro deficit, stroke suspected. EXAM: CT HEAD WITHOUT CONTRAST TECHNIQUE: Contiguous axial images were obtained from the base of the skull through the vertex without intravenous contrast. RADIATION DOSE REDUCTION: This exam was performed according to the departmental dose-optimization program which includes automated exposure control, adjustment of the mA and/or kV according to patient size and/or use of iterative reconstruction technique. COMPARISON:  None. FINDINGS: Brain: Age-related cerebral atrophy with chronic microvascular ischemic disease. Small remote lacunar infarct present at the left caudate. No acute intracranial hemorrhage. There is question of subtle hypodensity at the left occipital pole, not entirely certain, and may be artifactual. No other visible acute large vessel territory infarct. No mass lesion or midline shift. No hydrocephalus or extra-axial fluid collection. Vascular: No hyperdense vessel. Calcified atherosclerosis present at the skull base. Skull: Skin staples present at the left occipital scalp. Calvarium intact. Sinuses/Orbits: Globes and orbital soft tissues within normal limits. Endotracheal enteric tubes in place. Paranasal sinuses mastoid air cells remain largely clear. Other: None. ASPECTS [facility] Stroke Program Early CT Score) - Ganglionic level infarction (caudate, lentiform nuclei, internal capsule, insula, M1-M3 cortex): 7 - Supraganglionic infarction  (M4-M6 cortex): 3 Total score (0-10 with 10 being normal): 10 IMPRESSION: 1. Question subtle hypodensity versus artifact at the left occipital pole. No acute intracranial hemorrhage. 2. ASPECTS is 10. 3. Underlying age-related cerebral atrophy with chronic microvascular ischemic disease. These results were communicated to Dr. Cheral Marker at 1:58 am on 02-22-21 by text page via the [facility] messaging system. Electronically Signed   By: Jeannine Boga M.D.   On: 2021-02-22 02:09   CT ANGIO HEAD NECK W WO CM (CODE STROKE)  Result Date: 02/22/21 CLINICAL DATA:  Downtime case discussed between Dr. Algernon Huxley talking ductal ends in. Neuro deficit with acute stroke suspected EXAM: CT ANGIOGRAPHY HEAD AND NECK TECHNIQUE: Multidetector CT imaging of the head and neck was performed using the standard protocol during bolus administration of intravenous contrast. Multiplanar CT image reconstructions and MIPs were obtained to evaluate the vascular anatomy. Carotid stenosis measurements (when applicable) are obtained utilizing NASCET criteria, using the distal internal carotid diameter as the  denominator. RADIATION DOSE REDUCTION: This exam was performed according to the departmental dose-optimization program which includes automated exposure control, adjustment of the mA and/or kV according to patient size and/or use of iterative reconstruction technique. CONTRAST:  21mL OMNIPAQUE IOHEXOL 350 MG/ML SOLN COMPARISON:  Head CT from earlier the same night FINDINGS: CTA NECK FINDINGS Aortic arch: Atheromatous plaque.  Three vessel branching. Right carotid system: Primarily calcified atheromatous plaque greatest at the bifurcation and proximal ICA with proximal ICA stenosis measuring up to 80%. Attenuated downstream ICA, although a small appearance begins beyond the stenosis. No dissection or beading noted. Left carotid system: Primarily calcified plaque at the bifurcation and proximal ICA. Severe proximal ICA stenosis with  nonmeasurable lumen due to bulky calcified plaque. Diffusely attenuated downstream vessel attributed to underfilling. No irregularity or beading to implicate dissection. Vertebral arteries: Proximal subclavian calcified plaque which also involves both vertebral ostia. 60% narrowing of the proximal right subclavian. Right vertebral artery is dominant with origin stenosis measuring 55%. No significant stenosis at the left vertebral origin based on coronal reformats. Additional areas of presumably atheromatous narrowing, especially distal left V2 segment at the level of C2. Skeleton: Generalized degenerative disc narrowing and endplate ridging. Other neck: Left IJ central line with regional fat stranding likely from insertion. No measurable hematoma. Catheter position is unremarkable where covered. Endotracheal and enteric tube are unremarkable where covered. Upper chest: Clear apical lungs. Review of the MIP images confirms the above findings CTA HEAD FINDINGS Anterior circulation: Confluent atheromatous calcification on the carotid siphons. Bilateral supraclinoid ICA stenosis which is less than 50% when comparing 2 the downstream vessels. No branch occlusion, beading, or aneurysm. Posterior circulation: Calcified plaque on the V4 segments. No branch occlusion, beading, or flow limiting stenosis in the posterior circulation. Venous sinuses: Unremarkable in the arterial phase Anatomic variants: None significant Review of the MIP images confirms the above findings IMPRESSION: 1. Bulky calcified plaque at the bilateral proximal ICA with flow reducing stenosis and downstream attenuation on both sides. 2. 55% narrowing at the origin of the dominant right vertebral artery. 60% narrowing of the proximal right subclavian. 3. No intracranial branch occlusion. Electronically Signed   By: Jorje Guild M.D.   On: March 01, 2021 07:31   US Abdomen Limited RUQ (LIVER/GB)  Result Date: 02/24/2021 CLINICAL DATA:  Abdominal pain  EXAM: ULTRASOUND ABDOMEN LIMITED RIGHT UPPER QUADRANT COMPARISON:  06/25/2018 FINDINGS: Gallbladder: Small gallstones measuring up to 7 mm. No wall thickening or sonographic Murphy sign. Common bile duct: Diameter: Normal caliber, 5-6 mm. Liver: No focal lesion identified. Within normal limits in parenchymal echogenicity. Portal vein is patent on color Doppler imaging with normal direction of blood flow towards the liver. Other: None. IMPRESSION: Cholelithiasis.  No sonographic evidence of acute cholecystitis. Electronically Signed   By: Rolm Baptise M.D.   On: 02/26/2021 22:15    Microbiology Recent Results (from the past 240 hour(s))  Resp Panel by RT-PCR (Flu A&B, Covid) Nasopharyngeal Swab     Status: None   Collection Time: 02/12/2021  8:32 PM   Specimen: Nasopharyngeal Swab; Nasopharyngeal(NP) swabs in vial transport medium  Result Value Ref Range Status   SARS Coronavirus 2 by RT PCR NEGATIVE NEGATIVE Final    Comment: (NOTE) SARS-CoV-2 target nucleic acids are NOT DETECTED.  The SARS-CoV-2 RNA is generally detectable in upper respiratory specimens during the acute phase of infection. The lowest concentration of SARS-CoV-2 viral copies this assay can detect is 138 copies/mL. A negative result does not preclude SARS-Cov-2 infection and  should not be used as the sole basis for treatment or other patient management decisions. A negative result may occur with  improper specimen collection/handling, submission of specimen other than nasopharyngeal swab, presence of viral mutation(s) within the areas targeted by this assay, and inadequate number of viral copies(<138 copies/mL). A negative result must be combined with clinical observations, patient history, and epidemiological information. The expected result is Negative.  Fact Sheet for Patients:  EntrepreneurPulse.com.au  Fact Sheet for Healthcare Providers:  IncredibleEmployment.be  This test is no t  yet approved or cleared by the [place] FDA and  has been authorized for detection and/or diagnosis of SARS-CoV-2 by FDA under an Emergency Use Authorization (EUA). This EUA will remain  in effect (meaning this test can be used) for the duration of the COVID-19 declaration under Section 564(b)(1) of the Act, 21 U.S.C.section 360bbb-3(b)(1), unless the authorization is terminated  or revoked sooner.       Influenza A by PCR NEGATIVE NEGATIVE Final   Influenza B by PCR NEGATIVE NEGATIVE Final    Comment: (NOTE) The Xpert Xpress SARS-CoV-2/FLU/RSV plus assay is intended as an aid in the diagnosis of influenza from Nasopharyngeal swab specimens and should not be used as a sole basis for treatment. Nasal washings and aspirates are unacceptable for Xpert Xpress SARS-CoV-2/FLU/RSV testing.  Fact Sheet for Patients: EntrepreneurPulse.com.au  Fact Sheet for Healthcare Providers: IncredibleEmployment.be  This test is not yet approved or cleared by the [place] FDA and has been authorized for detection and/or diagnosis of SARS-CoV-2 by FDA under an Emergency Use Authorization (EUA). This EUA will remain in effect (meaning this test can be used) for the duration of the COVID-19 declaration under Section 564(b)(1) of the Act, 21 U.S.C. section 360bbb-3(b)(1), unless the authorization is terminated or revoked.  Performed at [facility], [place] [address], [place], [place] [address]   MRSA Next Gen by PCR, Nasal     Status: None   Collection Time: 02/14/21  2:41 PM   Specimen: Nasal Mucosa; Nasal Swab  Result Value Ref Range Status   MRSA by PCR Next Gen NOT DETECTED NOT DETECTED Final    Comment: (NOTE) The GeneXpert MRSA Assay (FDA approved for NASAL specimens only), is one component of a comprehensive MRSA colonization surveillance program. It is not intended to diagnose MRSA infection nor to guide or monitor treatment for MRSA  infections. Test performance is not FDA approved in patients less than 56 years old. Performed at [facility], [place] [address] [place], [place] [address]     Lab Basic Metabolic Panel: Recent Labs  Lab 02/18/2021 1915 02/12/21 0207 02/13/21 1110 02/14/21 0730 02/14/21 1618 02/14/21 2212 Mar 08, 2021 0444 March 08, 2021 0448  NA 129* 130* 134* 132* 134* 132* 126* 129*  K 4.1 3.5 3.5 3.8 4.1 5.2* 7.3* >7.5*  CL 94* 98 104 98 102  --   --  97*  CO2 15* 16* 20* 17* 9*  --   --  <7*  GLUCOSE 139* 97 106* 106* 101*  --   --  213*  BUN 14 14 9 11 16   --   --  18  CREATININE 1.29* 1.16* 0.78 1.12* 1.82*  --   --  2.30*  CALCIUM 8.8* 8.2* 8.0* 8.2* 7.4*  --   --  7.3*  MG 1.4* 2.0  --   --  1.5*  --   --   --    Liver Function Tests: Recent Labs  Lab 02/19/2021 1915  02/12/21 0207 February 18, 2021 0448  AST 28 23 7,869*  ALT 27 21 5,126*  ALKPHOS 31* 26* 61  BILITOT 1.7* 1.2 2.8*  PROT 7.1 6.2* 4.4*  ALBUMIN 4.0 3.4* 2.2*   Recent Labs  Lab 02/24/2021 1915  LIPASE 65*   No results for input(s): AMMONIA in the last 168 hours. CBC: Recent Labs  Lab 02/22/2021 1813 02/12/21 0207 02/13/21 0402 02/14/21 0730 02/14/21 1618 02/14/21 2212 2021/02/18 0444 2021-02-18 0448  WBC 6.1 7.7 7.1 14.2* 14.0*  --   --  11.8*  NEUTROABS 4.7 4.4 4.6 11.7*  --   --   --  10.0*  HGB 12.0 9.9* 9.0* 10.0* 9.1* 7.1* 6.1* 7.0*  HCT 35.5* 28.5* 25.4* 28.6* 27.2* 21.0* 18.0* 24.9*  MCV 91.0 88.8 87.9 87.7 93.2  --   --  110.7*  PLT PLATELET CLUMPS NOTED ON SMEAR, UNABLE TO ESTIMATE 127* 103* 119* 111*  --   --  93*   Cardiac Enzymes: Recent Labs  Lab 03/02/2021 1915  CKTOTAL 176   Sepsis Labs: Recent Labs  Lab 03/05/2021 2138 02/12/21 0207 02/13/21 0402 02/14/21 0730 02/14/21 1411 02/14/21 1618 02/14/21 1651 02/14/21 1848 February 18, 2021 0448  PROCALCITON  --   --   --   --  21.28  --   --   --  9.38  WBC  --    < > 7.1 14.2*  --  14.0*  --   --  11.8*  LATICACIDVEN 1.2  --   --   --   --   --  >9.0*  >9.0*  --    < > = values in this interval not displayed.    Procedures/Operations     [employer] 18-Feb-2021, 7:50 AM

## 2021-03-07 DEATH — deceased

## 2021-05-08 MED FILL — Medication: Qty: 1 | Status: AC

## 2021-05-29 ENCOUNTER — Ambulatory Visit: Payer: Medicare Other | Admitting: Adult Health

## 2021-08-15 ENCOUNTER — Other Ambulatory Visit (HOSPITAL_COMMUNITY): Payer: Medicare Other

## 2021-10-04 ENCOUNTER — Encounter: Payer: Medicare Other | Admitting: Adult Health

## 2021-10-09 ENCOUNTER — Encounter: Payer: Medicare Other | Admitting: Nurse Practitioner
